# Patient Record
Sex: Female | Born: 1970
Health system: Southern US, Community
[De-identification: ages and names within clinical notes are randomized; demographics above are authoritative.]

## PROBLEM LIST (undated history)

## (undated) DIAGNOSIS — R51 Headache: Secondary | ICD-10-CM

## (undated) DIAGNOSIS — R011 Cardiac murmur, unspecified: Secondary | ICD-10-CM

## (undated) DIAGNOSIS — F17200 Nicotine dependence, unspecified, uncomplicated: Secondary | ICD-10-CM

## (undated) DIAGNOSIS — C3490 Malignant neoplasm of unspecified part of unspecified bronchus or lung: Secondary | ICD-10-CM

## (undated) DIAGNOSIS — R7303 Prediabetes: Secondary | ICD-10-CM

## (undated) DIAGNOSIS — R000 Tachycardia, unspecified: Secondary | ICD-10-CM

## (undated) DIAGNOSIS — J449 Chronic obstructive pulmonary disease, unspecified: Secondary | ICD-10-CM

## (undated) DIAGNOSIS — R519 Headache, unspecified: Secondary | ICD-10-CM

## (undated) DIAGNOSIS — D759 Disease of blood and blood-forming organs, unspecified: Secondary | ICD-10-CM

## (undated) DIAGNOSIS — D682 Hereditary deficiency of other clotting factors: Secondary | ICD-10-CM

## (undated) HISTORY — DX: Nicotine dependence, unspecified, uncomplicated: F17.200

## (undated) HISTORY — PX: VAGINAL HYSTERECTOMY: SUR661

---

## 2000-03-05 ENCOUNTER — Emergency Department (HOSPITAL_COMMUNITY): Admission: EM | Admit: 2000-03-05 | Discharge: 2000-03-05 | Payer: Self-pay | Admitting: Emergency Medicine

## 2000-03-05 ENCOUNTER — Encounter: Payer: Self-pay | Admitting: Emergency Medicine

## 2000-08-12 ENCOUNTER — Emergency Department (HOSPITAL_COMMUNITY): Admission: EM | Admit: 2000-08-12 | Discharge: 2000-08-12 | Payer: Self-pay | Admitting: Emergency Medicine

## 2000-10-05 ENCOUNTER — Emergency Department (HOSPITAL_COMMUNITY): Admission: EM | Admit: 2000-10-05 | Discharge: 2000-10-05 | Payer: Self-pay | Admitting: Emergency Medicine

## 2000-10-05 ENCOUNTER — Encounter: Payer: Self-pay | Admitting: Emergency Medicine

## 2000-11-13 ENCOUNTER — Other Ambulatory Visit: Admission: RE | Admit: 2000-11-13 | Discharge: 2000-11-13 | Payer: Self-pay | Admitting: Obstetrics and Gynecology

## 2000-12-19 ENCOUNTER — Inpatient Hospital Stay (HOSPITAL_COMMUNITY): Admission: AD | Admit: 2000-12-19 | Discharge: 2000-12-19 | Payer: Self-pay | Admitting: Obstetrics and Gynecology

## 2001-05-20 ENCOUNTER — Encounter: Payer: Self-pay | Admitting: Obstetrics and Gynecology

## 2001-05-20 ENCOUNTER — Ambulatory Visit (HOSPITAL_COMMUNITY): Admission: RE | Admit: 2001-05-20 | Discharge: 2001-05-20 | Payer: Self-pay | Admitting: Obstetrics and Gynecology

## 2001-06-14 ENCOUNTER — Encounter (INDEPENDENT_AMBULATORY_CARE_PROVIDER_SITE_OTHER): Payer: Self-pay | Admitting: Specialist

## 2001-06-14 ENCOUNTER — Inpatient Hospital Stay (HOSPITAL_COMMUNITY): Admission: AD | Admit: 2001-06-14 | Discharge: 2001-06-18 | Payer: Self-pay | Admitting: Obstetrics and Gynecology

## 2001-06-19 ENCOUNTER — Encounter: Admission: RE | Admit: 2001-06-19 | Discharge: 2001-07-19 | Payer: Self-pay | Admitting: Obstetrics and Gynecology

## 2001-12-27 ENCOUNTER — Ambulatory Visit (HOSPITAL_COMMUNITY): Admission: RE | Admit: 2001-12-27 | Discharge: 2001-12-27 | Payer: Self-pay | Admitting: Oncology

## 2002-07-16 ENCOUNTER — Ambulatory Visit (HOSPITAL_COMMUNITY): Admission: RE | Admit: 2002-07-16 | Discharge: 2002-07-16 | Payer: Self-pay | Admitting: Oncology

## 2002-07-16 ENCOUNTER — Encounter: Payer: Self-pay | Admitting: Oncology

## 2003-03-27 ENCOUNTER — Ambulatory Visit (HOSPITAL_COMMUNITY): Admission: RE | Admit: 2003-03-27 | Discharge: 2003-03-27 | Payer: Self-pay | Admitting: Oncology

## 2003-03-27 ENCOUNTER — Encounter: Payer: Self-pay | Admitting: Oncology

## 2004-01-28 ENCOUNTER — Ambulatory Visit: Admission: RE | Admit: 2004-01-28 | Discharge: 2004-01-28 | Payer: Self-pay | Admitting: Oncology

## 2005-03-29 ENCOUNTER — Ambulatory Visit: Payer: Self-pay | Admitting: Oncology

## 2007-04-18 ENCOUNTER — Emergency Department (HOSPITAL_COMMUNITY): Admission: EM | Admit: 2007-04-18 | Discharge: 2007-04-18 | Payer: Self-pay | Admitting: Emergency Medicine

## 2007-04-23 ENCOUNTER — Emergency Department (HOSPITAL_COMMUNITY): Admission: EM | Admit: 2007-04-23 | Discharge: 2007-04-23 | Payer: Self-pay | Admitting: *Deleted

## 2007-05-08 ENCOUNTER — Ambulatory Visit: Payer: Self-pay | Admitting: Family Medicine

## 2007-05-08 DIAGNOSIS — K921 Melena: Secondary | ICD-10-CM

## 2007-05-08 DIAGNOSIS — R071 Chest pain on breathing: Secondary | ICD-10-CM

## 2007-05-08 DIAGNOSIS — N760 Acute vaginitis: Secondary | ICD-10-CM | POA: Insufficient documentation

## 2007-05-08 DIAGNOSIS — Z862 Personal history of diseases of the blood and blood-forming organs and certain disorders involving the immune mechanism: Secondary | ICD-10-CM | POA: Insufficient documentation

## 2007-05-08 DIAGNOSIS — K2960 Other gastritis without bleeding: Secondary | ICD-10-CM

## 2007-05-08 DIAGNOSIS — I809 Phlebitis and thrombophlebitis of unspecified site: Secondary | ICD-10-CM

## 2007-05-24 ENCOUNTER — Ambulatory Visit: Payer: Self-pay | Admitting: Family Medicine

## 2007-05-29 ENCOUNTER — Encounter: Admission: RE | Admit: 2007-05-29 | Discharge: 2007-05-29 | Payer: Self-pay | Admitting: Family Medicine

## 2008-01-16 ENCOUNTER — Emergency Department (HOSPITAL_COMMUNITY): Admission: EM | Admit: 2008-01-16 | Discharge: 2008-01-16 | Payer: Self-pay | Admitting: Family Medicine

## 2008-02-07 ENCOUNTER — Ambulatory Visit (HOSPITAL_COMMUNITY): Admission: RE | Admit: 2008-02-07 | Discharge: 2008-02-07 | Payer: Self-pay | Admitting: Gastroenterology

## 2008-02-07 ENCOUNTER — Encounter (INDEPENDENT_AMBULATORY_CARE_PROVIDER_SITE_OTHER): Payer: Self-pay | Admitting: Gastroenterology

## 2009-07-21 ENCOUNTER — Ambulatory Visit (HOSPITAL_COMMUNITY): Admission: RE | Admit: 2009-07-21 | Discharge: 2009-07-21 | Payer: Self-pay | Admitting: Obstetrics & Gynecology

## 2009-11-30 ENCOUNTER — Emergency Department (HOSPITAL_COMMUNITY): Admission: EM | Admit: 2009-11-30 | Discharge: 2009-11-30 | Payer: Self-pay | Admitting: Emergency Medicine

## 2010-12-20 ENCOUNTER — Ambulatory Visit: Payer: Self-pay | Admitting: Hematology & Oncology

## 2010-12-23 LAB — CBC
HCT: 38.5 % (ref 36.0–46.0)
Hemoglobin: 13.1 g/dL (ref 12.0–15.0)
MCV: 92.9 fL (ref 78.0–100.0)
RBC: 4.15 MIL/uL (ref 3.87–5.11)
WBC: 6 10*3/uL (ref 4.0–10.5)

## 2010-12-23 LAB — URINE MICROSCOPIC-ADD ON

## 2010-12-23 LAB — CSF CULTURE W GRAM STAIN: Culture: NO GROWTH

## 2010-12-23 LAB — PROTIME-INR: INR: 1.02 (ref 0.00–1.49)

## 2010-12-23 LAB — URINALYSIS, ROUTINE W REFLEX MICROSCOPIC
Glucose, UA: NEGATIVE mg/dL
Ketones, ur: 15 mg/dL — AB
Leukocytes, UA: NEGATIVE
Nitrite: NEGATIVE
Protein, ur: NEGATIVE mg/dL
Specific Gravity, Urine: 1.023 (ref 1.005–1.030)
Urobilinogen, UA: 0.2 mg/dL (ref 0.0–1.0)
pH: 5.5 (ref 5.0–8.0)

## 2010-12-23 LAB — CSF CELL COUNT WITH DIFFERENTIAL
RBC Count, CSF: 1 /mm3 — ABNORMAL HIGH
RBC Count, CSF: 2 /mm3 — ABNORMAL HIGH
WBC, CSF: 1 /mm3 (ref 0–5)
WBC, CSF: 3 /mm3 (ref 0–5)

## 2010-12-23 LAB — POCT I-STAT, CHEM 8
BUN: 12 mg/dL (ref 6–23)
Calcium, Ion: 1.19 mmol/L (ref 1.12–1.32)
HCT: 45 % (ref 36.0–46.0)
Hemoglobin: 15.3 g/dL — ABNORMAL HIGH (ref 12.0–15.0)
Sodium: 140 mEq/L (ref 135–145)

## 2010-12-23 LAB — PROTEIN AND GLUCOSE, CSF
Glucose, CSF: 70 mg/dL (ref 43–76)
Total  Protein, CSF: 31 mg/dL (ref 15–45)

## 2010-12-23 LAB — APTT: aPTT: 28 seconds (ref 24–37)

## 2010-12-30 ENCOUNTER — Other Ambulatory Visit: Payer: Self-pay | Admitting: Hematology & Oncology

## 2010-12-30 ENCOUNTER — Ambulatory Visit (HOSPITAL_BASED_OUTPATIENT_CLINIC_OR_DEPARTMENT_OTHER)
Admission: RE | Admit: 2010-12-30 | Discharge: 2010-12-30 | Disposition: A | Discharge status: Home / Self Care | Payer: 59 | Source: Ambulatory Visit | Attending: Hematology & Oncology | Admitting: Hematology & Oncology

## 2010-12-30 ENCOUNTER — Ambulatory Visit (HOSPITAL_BASED_OUTPATIENT_CLINIC_OR_DEPARTMENT_OTHER): Payer: Commercial Managed Care - PPO | Admitting: Hematology & Oncology

## 2010-12-30 DIAGNOSIS — F17200 Nicotine dependence, unspecified, uncomplicated: Secondary | ICD-10-CM

## 2010-12-30 DIAGNOSIS — R634 Abnormal weight loss: Secondary | ICD-10-CM | POA: Insufficient documentation

## 2010-12-30 DIAGNOSIS — R059 Cough, unspecified: Secondary | ICD-10-CM | POA: Insufficient documentation

## 2010-12-30 DIAGNOSIS — R05 Cough: Secondary | ICD-10-CM | POA: Insufficient documentation

## 2010-12-30 DIAGNOSIS — K625 Hemorrhage of anus and rectum: Secondary | ICD-10-CM

## 2010-12-30 DIAGNOSIS — D6949 Other primary thrombocytopenia: Secondary | ICD-10-CM

## 2010-12-30 DIAGNOSIS — D6859 Other primary thrombophilia: Secondary | ICD-10-CM

## 2010-12-30 DIAGNOSIS — D696 Thrombocytopenia, unspecified: Secondary | ICD-10-CM

## 2010-12-30 LAB — CBC WITH DIFFERENTIAL (CANCER CENTER ONLY)
BASO#: 0 10*3/uL (ref 0.0–0.2)
EOS%: 1.4 % (ref 0.0–7.0)
Eosinophils Absolute: 0.1 10*3/uL (ref 0.0–0.5)
MONO%: 7.6 % (ref 0.0–13.0)
Platelets: 121 10*3/uL — ABNORMAL LOW (ref 145–400)
RBC: 4.06 10*6/uL (ref 3.70–5.32)
WBC: 5.7 10*3/uL (ref 3.9–10.0)

## 2011-01-02 LAB — T4: T4, Total: 9.7 ug/dL (ref 5.0–12.5)

## 2011-01-02 LAB — CARDIOLIPIN ANTIBODIES, IGG, IGM, IGA
Anticardiolipin IgA: 3 APL U/mL (ref <22)
Anticardiolipin IgM: 2 MPL U/mL (ref <11)

## 2011-01-03 LAB — LUPUS ANTICOAGULANT PANEL
DRVVT: 35.4 secs — ABNORMAL LOW (ref 36.2–44.3)
Lupus Anticoagulant: NOT DETECTED

## 2011-01-04 ENCOUNTER — Other Ambulatory Visit (HOSPITAL_COMMUNITY): Payer: Commercial Managed Care - PPO

## 2011-01-06 ENCOUNTER — Ambulatory Visit (HOSPITAL_COMMUNITY)
Admission: RE | Admit: 2011-01-06 | Discharge: 2011-01-06 | Disposition: A | Discharge status: Home / Self Care | Payer: 59 | Source: Ambulatory Visit | Attending: Hematology & Oncology | Admitting: Hematology & Oncology

## 2011-01-06 DIAGNOSIS — R634 Abnormal weight loss: Secondary | ICD-10-CM | POA: Insufficient documentation

## 2011-01-06 DIAGNOSIS — K625 Hemorrhage of anus and rectum: Secondary | ICD-10-CM

## 2011-02-01 ENCOUNTER — Other Ambulatory Visit: Payer: Self-pay | Admitting: Hematology & Oncology

## 2011-02-01 ENCOUNTER — Encounter (HOSPITAL_BASED_OUTPATIENT_CLINIC_OR_DEPARTMENT_OTHER): Payer: Commercial Managed Care - PPO | Admitting: Hematology & Oncology

## 2011-02-01 DIAGNOSIS — D6949 Other primary thrombocytopenia: Secondary | ICD-10-CM

## 2011-02-01 DIAGNOSIS — D696 Thrombocytopenia, unspecified: Secondary | ICD-10-CM

## 2011-02-01 LAB — CBC WITH DIFFERENTIAL (CANCER CENTER ONLY)
BASO#: 0 10*3/uL (ref 0.0–0.2)
Eosinophils Absolute: 0.1 10*3/uL (ref 0.0–0.5)
HCT: 37.2 % (ref 34.8–46.6)
LYMPH#: 1.7 10*3/uL (ref 0.9–3.3)
MCHC: 34.1 g/dL (ref 32.0–36.0)
MONO#: 0.4 10*3/uL (ref 0.1–0.9)
RDW: 13.2 % (ref 11.1–15.7)

## 2011-02-01 LAB — CHCC SATELLITE - SMEAR

## 2011-02-14 NOTE — Op Note (Signed)
Heather [name], Heather [name]                 ACCOUNT NO.:  [id number]   MEDICAL RECORD NO.:  [id number]           PATIENT TYPE:   LOCATION:                                 FACILITY:   PHYSICIAN:  John C. Madilyn Fireman, M.D.    DATE OF BIRTH:  06-16-1971   DATE OF PROCEDURE:  DATE OF DISCHARGE:                               OPERATIVE REPORT   INDICATIONS FOR PROCEDURE:  Rectal bleeding, heme-positive stools, and  family history of colon cancer in a second-degree relative.   PROCEDURE:  The patient was placed in the left lateral decubitus  position and placed on the pulse monitor with continuous low-flow oxygen  delivered by nasal cannula.  She was sedated with 125 mcg of IV fentanyl  and 12.5 mg of IV Versed.  The Olympus video colonoscope was inserted  into the rectum and advanced to the cecum, confirmed by  transillumination McBurney's point and visualization of the ileocecal  valve and appendiceal orifice.  The prep was good.  The cecum and  ascending colon appeared normal with no masses, polyps, diverticula, or  other mucosal abnormalities.  Within the transverse colon, there was a 1-  cm sessile polyp that was removed by snare.  In the sigmoid colon, there  was a 4-mm polyp that was removed by hot biopsy.  Otherwise, no  abnormalities were seen.  The rectum, likewise, appeared normal and  retroflexed view of the anus revealed only small internal hemorrhoids.  The scope was then withdrawn and the patient returned to the recovery  room in stable condition.  She tolerated the procedure well.  There were  no immediate complications.   IMPRESSION:  1. A 1-cm sessile polyp in the transverse colon.  2. A 4-mm sigmoid polyp.   PLAN:  Await histology to determine method and interval for future colon  screening.           ______________________________  Everardo All. Madilyn Fireman, M.D.     J[facility]/MEDQ  D:  02/07/2008  T:  02/08/2008  Job:  [id number]   cc:   Onalee Hua L. Call, MD

## 2011-02-17 NOTE — Discharge Summary (Signed)
[facility]  Patient:    Heather [name], Heather [name] Visit Number: [id number] MRN: [id number]          Service Type: OBS Location: 910A 9136 01 Attending Physician:  Melony Overly Dictated by:   Janeece Riggers Dareen Piano, M.D. Admit Date:  06/14/2001 Discharge Date: 06/18/2001                             Discharge Summary  PRINCIPAL DISCHARGE DIAGNOSES:                    1. Intrauterine pregnancy at 38-1/2 weeks                                  estimated gestational age.                               2. History of prior cesarean section.                               3. Declined attempt at vaginal birth after                                  cesarean section.                               4. Factor V Leiden deficiency, heterozygous                                  status.                               5. Thrombocytopenia.                               6. Desires permanent sterilization.  PRINCIPAL PROCEDURES:         1. Repeat low-transverse cesarean section.                               2. Bilateral tubal ligation.  HISTORY OF PRESENT ILLNESS:   The patient is a 40 year old female, G3, P1-0-1-1, at 38-1/2 weeks estimated gestational age who presented to the [facility] for a repeat cesarean section and bilateral tubal ligation. In light of her history of a factor V Leiden deficiency, the patient was treated with Lovenox in the third trimester.  She has no history of DVT or blood clots.  The patient was off Lovenox since 37 weeks estimated gestational age.  HOSPITAL COURSE:              On admission, the patients coagulation studies were within normal limits.  Her platelet count was 102.  The patient underwent a repeat cesarean section by Dr. Conley Simmonds, a complete description of this could be found dictated in operative note.  She delivered one live viable white female with Apgars at 9 at one minute and 1 at five minutes, weight of  7 pounds 14 ounces.  Placenta was sent to pathology.  During the postoperative period, the patient was complaining of some right shoulder pain.  This resolved spontaneously.  The baby was transferred to the neonatal intensive care unit in light of breathing difficulties.  The patient did well throughout the remaining hospitalization.  The patient was discharged home on postoperative day #4.  The patients preoperative hemoglobin was 11.4, postoperative 10.7.  The platelet count postoperative was 98,000.  The patient was discharged to home.  She was instructed to follow up in the office in four weeks.  She was sent home with Tylox to take p.r.n. Dictated by:   Janeece Riggers Dareen Piano, M.D. Attending Physician:  Melony Overly DD:  07/12/01 TD:  07/12/01 Job: [id number]

## 2011-02-17 NOTE — Op Note (Signed)
[facility]  Patient:    Heather [name], Heather [name] Visit Number: [id number] MRN: [id number]          Service Type: OBS Location: 910B 9198 01 Attending Physician:  Melony Overly Dictated by:   Devoria Albe Edward Jolly, M.D. Proc. Date: 06/14/01 Admit Date:  06/14/2001                             Operative Report  PREOPERATIVE DIAGNOSES:       1. Intrauterine pregnancy 38+4 weeks.                               2. History of cesarean section, declines                                  vaginal birth after cesarean section.                               3. Desire for permanent sterilization.                               4. Factor V Leiden deficiency:  Heterozygous                                  status.                               5. Thrombocytopenia.  POSTOPERATIVE DIAGNOSES:      1. Intrauterine pregnancy at 38+4 weeks.                               2. History of cesarean section, declines                                  vaginal birth after cesarean section.                               3. Desire for permanent sterilization.                               4. Factor V Leiden deficiency:  Heterozygous                                  status.                               5. Thrombocytopenia.  OPERATION:                    Primary low segment transverse cesarean section,                               bilateral tubal ligation.  SURGEON:  Brook A. Edward Jolly, M.D.  ASSISTANT:                    Gerlene Burdock D. Arlyce Dice, M.D.  ANESTHESIA:                   Spinal anesthesia.  COMPLICATIONS:                None.  INDICATIONS:                  The patient was a 40 year old gravida 3, para 1-0-1-1 Caucasian female at 38+[redacted] weeks gestation [facility] June 24, 2001), with a history of prior C-section for a breech presentation and a known heterozygous status for factor V Leiden deficiency diagnosed during this pregnancy and treated with Lovenox in the third  trimester, who presented for repeat cesarean section after declining a vaginal trial of labor.  The patient had expressed a desire for permanent sterilization throughout her antepartum course, and she chose to proceed with this at the time of her cesarean delivery.  With respect to the patients factor V Leiden deficiency status, she had no history of deep venous thrombosis or blood clots, and she was treated with the Lovenox in the third trimester until 37 weeks of gestation. The patient chose to proceed with a repeat C-section and the tubal ligation after the risks, benefits, and alternatives were discussed with her.  The patient was quoted a failure rate of the tubal ligation of 1 in 250 to 1 in 300 which may result in either an intrauterine or ectopic pregnancy.  The patient accepted this.  FINDINGS:                     A viable female was delivered at 12:45 p.m. with Apgars of 9 at one minute and 9 at five minutes. The weight was 7 pounds 14 ounces.  The newborn was noted to be vigorous at birth and without any obvious abnormalities.  SPECIMENS:                    The placenta was sent to pathology.  DESCRIPTION OF PROCEDURE:     With an IV in place, the patient was taken to the operating room after she was properly identified.  The patient received a spinal anesthetic and she was then placed in the supine position.  The abdomen and vulva were sterilely prepped. A Foley catheter was sterilely placed inside the bladder. She was then sterilely draped.  After adequate anesthesia was insured, Pfannenstiel incision was created along the site of the patients prior incision.  This was carried down to the fascia using the scalpel. The fascia was then scored in the midline with the scalpel. Monopolar cautery was used to come through some of the scar tissue and subcutaneous tissue down to the level of the fascia. The fascial incision was then incised bilaterally in a transverse fashion with  the Mayo scissors. The rectus muscles were dissected off of the fascia using Mayo scissors.  The parietal peritoneum was entered sharply with Metzenbaum scissors after elevating it with two hemostat clamps. The incision was then extended cranially and caudally.  Dense adhesions overlying the bladder were lysed using a scalpel.  This was performed without difficulty.  There was no evidence of cystotomy.  A bladder retractor was placed over the bladder, and the lower uterine segment was exposed.  A bladder flap was created sharply with a Metzenbaum scissors. The lower  uterine segment was then incised in a transverse fashion using the scalpel.  The incision was enlarged using a bandage scissors. An Allis clamp was used to rupture membranes.  The fluid was clear.  A hand was inserted through the incision, and the vertex was then delivered without difficulty along with the remainder of the infant. The nares and mouth were suctioned, and the cord was doubly clamped and cut.  The newborn was vigorous and carried over to the awaiting pediatricians.  The patient did receive cefazolin 1 g intravenously at cord clamp. Cord blood was obtained.  The placenta was manually extracted along with any remaining membranes from within the uterine cavity.  The uterus was exteriorized for its closure. This was a two-layer closer of #1 chromic. The first layer was a running locked layer, and the second layer was an imbricating layer.  Hemostasis was then excellent.  Bilateral tubal ligation was performed at this time.  The tubes and ovaries were noted to be normal. The right fallopian tube was grasped in its mid portion and followed to its fimbriated end.  A free-tie of 0 plain suture was then placed at the base of the knuckle of tissue.  A snap clamp was used to come through the mesosalpinx, and an additional free-tie of 2-0 plain was placed at the base of each knuckle of tissue. The intervening portion  was excised with the Metzenbaum scissors and it was sent to pathology.  Hemostasis was excellent.  The same procedure that was performed on the patients right  side was then repeated on the left.  After that, fallopian tube was grasped and followed all the way to its fimbriated end.  Again, the specimen was sent to pathology and hemostasis was excellent.  The uterus was returned to the peritoneal cavity and it was irrigated and suctioned.  Hemostasis was excellent.  The fascia was next examined and there was no evidence of any hematomas and no bleeding.  The fascia was therefore closed with a running suture of 0 Vicryl. The subcutaneous tissue was then irrigated, and small bleeding vessels were cauterized with monopolar cautery. Hemostasis was excellent.  Then the subcutaneous tissue was closed with interrupted sutures of 3-0 plain followed by staples on the skin and a sterile pressure dressing.  The uterus was expressed of any remaining clots. The patient was escorted to the recovery room in stable and good condition. There were no complications to the procedure.  All sponge, needle and instrument counts were correct. Dictated by:   Devoria Albe Edward Jolly, M.D. Attending Physician:  Melony Overly DD:  06/14/01 TD:  06/14/01 Job: [id number]

## 2011-05-03 ENCOUNTER — Other Ambulatory Visit (HOSPITAL_COMMUNITY): Payer: Self-pay | Admitting: Obstetrics & Gynecology

## 2011-05-03 DIAGNOSIS — R1900 Intra-abdominal and pelvic swelling, mass and lump, unspecified site: Secondary | ICD-10-CM

## 2011-05-03 DIAGNOSIS — Z90710 Acquired absence of both cervix and uterus: Secondary | ICD-10-CM

## 2011-05-03 DIAGNOSIS — R102 Pelvic and perineal pain: Secondary | ICD-10-CM

## 2011-05-05 ENCOUNTER — Ambulatory Visit (HOSPITAL_COMMUNITY)
Admission: RE | Admit: 2011-05-05 | Discharge: 2011-05-05 | Disposition: A | Discharge status: Home / Self Care | Payer: 59 | Source: Ambulatory Visit | Attending: Obstetrics & Gynecology | Admitting: Obstetrics & Gynecology

## 2011-05-05 DIAGNOSIS — R1900 Intra-abdominal and pelvic swelling, mass and lump, unspecified site: Secondary | ICD-10-CM

## 2011-05-05 DIAGNOSIS — R102 Pelvic and perineal pain: Secondary | ICD-10-CM

## 2011-05-05 DIAGNOSIS — N938 Other specified abnormal uterine and vaginal bleeding: Secondary | ICD-10-CM | POA: Insufficient documentation

## 2011-05-05 DIAGNOSIS — Z90710 Acquired absence of both cervix and uterus: Secondary | ICD-10-CM | POA: Insufficient documentation

## 2011-05-05 DIAGNOSIS — N949 Unspecified condition associated with female genital organs and menstrual cycle: Secondary | ICD-10-CM | POA: Insufficient documentation

## 2011-05-05 MED ORDER — IOHEXOL 300 MG/ML  SOLN
100.0000 mL | Freq: Once | INTRAMUSCULAR | Status: AC | PRN
  Administered 2011-05-05: 100 mL via INTRAVENOUS

## 2011-06-19 ENCOUNTER — Other Ambulatory Visit: Payer: Self-pay | Admitting: Obstetrics & Gynecology

## 2011-06-27 LAB — POCT URINALYSIS DIP (DEVICE)
Bilirubin Urine: NEGATIVE
Ketones, ur: NEGATIVE
Protein, ur: NEGATIVE
pH: 6

## 2011-06-27 LAB — WET PREP, GENITAL: Yeast Wet Prep HPF POC: NONE SEEN

## 2011-06-27 LAB — APTT: aPTT: 25

## 2011-06-27 LAB — POCT I-STAT, CHEM 8
BUN: 11
Calcium, Ion: 1.27
Chloride: 104
Creatinine, Ser: 0.9
Glucose, Bld: 102 — ABNORMAL HIGH
TCO2: 30

## 2011-06-27 LAB — PROTIME-INR: INR: 0.9

## 2011-06-30 ENCOUNTER — Encounter (HOSPITAL_COMMUNITY)
Admission: RE | Admit: 2011-06-30 | Discharge: 2011-06-30 | Disposition: A | Discharge status: Home / Self Care | Payer: 59 | Source: Ambulatory Visit | Attending: Obstetrics & Gynecology | Admitting: Obstetrics & Gynecology

## 2011-06-30 ENCOUNTER — Encounter (HOSPITAL_COMMUNITY): Payer: Self-pay

## 2011-06-30 HISTORY — DX: Hereditary deficiency of other clotting factors: D68.2

## 2011-06-30 HISTORY — DX: Disease of blood and blood-forming organs, unspecified: D75.9

## 2011-06-30 LAB — BASIC METABOLIC PANEL
BUN: 14 mg/dL (ref 6–23)
CO2: 29 mEq/L (ref 19–32)
Calcium: 10.1 mg/dL (ref 8.4–10.5)
Creatinine, Ser: 0.67 mg/dL (ref 0.50–1.10)
GFR calc non Af Amer: >60 mL/min (ref >60)
Glucose, Bld: 83 mg/dL (ref 70–99)
Sodium: 138 mEq/L (ref 135–145)

## 2011-06-30 LAB — CBC
MCH: 31.1 pg (ref 26.0–34.0)
MCHC: 33.2 g/dL (ref 30.0–36.0)
MCV: 93.8 fL (ref 78.0–100.0)
Platelets: 136 10*3/uL — ABNORMAL LOW (ref 150–400)
RDW: 13.7 % (ref 11.5–15.5)

## 2011-06-30 LAB — SURGICAL PCR SCREEN: MRSA, PCR: NEGATIVE

## 2011-06-30 LAB — APTT: aPTT: 27 seconds (ref 24–37)

## 2011-06-30 NOTE — Patient Instructions (Addendum)
   Your procedure is scheduled on: Friday, Oct. 5, 2012  Enter through the [employer] of [facility] at: 6:00am Pick up the phone at the desk and dial [phone] and inform us of your arrival  Please call this number if you have any problems the morning of surgery: [phone]  Remember: Do not eat food after midnight Thursday 10/4 Do not drink clear liquids after: midnight Thursday 10/4 Take these medicines the morning of surgery with a SIP OF WATER: none  Do not wear jewelry, make-up, or FINGER nail polish Do not wear lotions, powders, or perfumes.  You may wear deodorant. Do not shave 48 hours prior to surgery. Do not bring valuables to the hospital. Leave suitcase in the car. After Surgery it may be brought to your room. For patients being admitted to the hospital, checkout time is 11:00am the day of discharge.  Patients discharged on the day of surgery will not be allowed to drive home.   Name and phone number of your driver: son Heather [name]    Remember to use your hibiclens as instructed.Please shower with 1/2 bottle the evening before your surgery and the other 1/2 bottle the morning of surgery.

## 2011-06-30 NOTE — Pre-Procedure Instructions (Signed)
Ok to see anesthesia DOS 

## 2011-07-06 ENCOUNTER — Other Ambulatory Visit (HOSPITAL_COMMUNITY): Payer: Self-pay | Admitting: Obstetrics & Gynecology

## 2011-07-06 DIAGNOSIS — R102 Pelvic and perineal pain: Secondary | ICD-10-CM

## 2011-07-06 NOTE — H&P (Signed)
Heather [name] is an 40 y.o. female. [id number]. Here for diagnostic laparoscopy, possible robot assisted lysis of adhesions and vaginal exam under anesthesia for ongoing pelvic pain, dyspareunia, severe foul vag dc since I first saw her in 2010 and her complaints date back since her TAH/BSO in 2008 for cervical cancer and prolapse (done with [facility])  Expectant mgmt/ treating vag infections/ improving vaginal health with vaginal estrogen have failed. Pelvic sono and pelvic/abdominal CT with contrast failed to reveal any abnormal findings inc masses/ adhesions/ pelvic fluid/ fistula etc.  I discussed referral to Pain clinic at [facility] vs diag.L'scpy and possible management if abnormal findings (as long as not severe bowel adhesions) and if negative referring to pain clinic then. Patient desires intervention with l'scopy first and is willing to see specialist if needed after.  Prior op note from 2008 reviewed with just TAH/BSO, no intervention mentioned in Op note of prolapse suspension/ repair ir use of foreign non-absorbable sutures.   No LMP recorded. Patient has had a hysterectomy.   Past Medical History  Diagnosis Date  . Difficult intubation     pt was told difficult intubation at [facility]  . Blood dyscrasia   . Factor V deficiency     protein deficiency   Past Surgical History  Procedure Date  . Abdominal hysterectomy   . Cesarean section     x 2   No family history on file.  Social History:- Smoker-  reports that she has been smoking Cigarettes.  She has a 28 pack-year smoking history. She has never used smokeless tobacco. She reports that she drinks alcohol. She reports that she does not use illicit drugs.  Allergies:  Allergies  Allergen Reactions  . Aspirin Nausea And Vomiting  . Contrast Media (Iodinated Diagnostic Agents) Swelling    (Not in a hospital admission)  ROS - Wt loss ++, poor appetite, no bruising/hematomas (has chronic low platelets, s/p Heme  consult in 02/12), no CP/SOB/ bladder or bowel movement problems.    There were no vitals taken for this visit.  Physical Exam  A&O x 3, no acute distress. Pleasant, very thin built. HEENT neg, no thyromegaly Lungs CTA bilat CV RRR, S1S2 normal Abdo soft, non tender, non acute Extr no edema/ tenderness Pelvic - office exam reveal - excessive discharge, very atrophic vagina, no prolapse, thick scar tissue felt in right adnexa.    No results found for this or any previous visit (from the past 24 hour(s)).  No results found.  Assessment/Plan: Pelvic pain, dyspareunia, severe foul vag discharge.  Diagnostic laparoscopy, possible operative l/scopy with possible robot intervention.  Risks/ complications/ alternative reviewed, pa understands and agrees.   Tasharra Nodine R 07/06/2011, 11:16 PM

## 2011-07-07 ENCOUNTER — Other Ambulatory Visit: Payer: Self-pay | Admitting: Obstetrics & Gynecology

## 2011-07-07 ENCOUNTER — Ambulatory Visit (HOSPITAL_COMMUNITY)
Admission: RE | Admit: 2011-07-07 | Discharge: 2011-07-07 | Disposition: A | Discharge status: Home / Self Care | Payer: 59 | Source: Ambulatory Visit | Attending: Obstetrics & Gynecology | Admitting: Obstetrics & Gynecology

## 2011-07-07 ENCOUNTER — Encounter (HOSPITAL_COMMUNITY): Payer: Self-pay | Admitting: Obstetrics & Gynecology

## 2011-07-07 ENCOUNTER — Encounter (HOSPITAL_COMMUNITY)
Admission: RE | Disposition: A | Discharge status: Home / Self Care | Payer: Self-pay | Source: Ambulatory Visit | Attending: Obstetrics & Gynecology

## 2011-07-07 ENCOUNTER — Ambulatory Visit (HOSPITAL_COMMUNITY): Payer: 59 | Admitting: Anesthesiology

## 2011-07-07 ENCOUNTER — Encounter (HOSPITAL_COMMUNITY): Payer: Self-pay | Admitting: Anesthesiology

## 2011-07-07 DIAGNOSIS — N949 Unspecified condition associated with female genital organs and menstrual cycle: Secondary | ICD-10-CM | POA: Insufficient documentation

## 2011-07-07 DIAGNOSIS — N736 Female pelvic peritoneal adhesions (postinfective): Secondary | ICD-10-CM | POA: Insufficient documentation

## 2011-07-07 DIAGNOSIS — Z01818 Encounter for other preprocedural examination: Secondary | ICD-10-CM | POA: Insufficient documentation

## 2011-07-07 DIAGNOSIS — R102 Pelvic and perineal pain: Secondary | ICD-10-CM

## 2011-07-07 DIAGNOSIS — Z90710 Acquired absence of both cervix and uterus: Secondary | ICD-10-CM | POA: Insufficient documentation

## 2011-07-07 DIAGNOSIS — IMO0002 Reserved for concepts with insufficient information to code with codable children: Secondary | ICD-10-CM | POA: Insufficient documentation

## 2011-07-07 DIAGNOSIS — D6859 Other primary thrombophilia: Secondary | ICD-10-CM | POA: Insufficient documentation

## 2011-07-07 DIAGNOSIS — D696 Thrombocytopenia, unspecified: Secondary | ICD-10-CM | POA: Insufficient documentation

## 2011-07-07 DIAGNOSIS — Z01812 Encounter for preprocedural laboratory examination: Secondary | ICD-10-CM | POA: Insufficient documentation

## 2011-07-07 DIAGNOSIS — N898 Other specified noninflammatory disorders of vagina: Secondary | ICD-10-CM | POA: Insufficient documentation

## 2011-07-07 HISTORY — PX: LESION REMOVAL: SHX5196

## 2011-07-07 HISTORY — PX: LAPAROSCOPY: SHX197

## 2011-07-07 SURGERY — ROBOTIC ASSISTED LAPAROSCOPIC LYSIS OF ADHESION
Anesthesia: General | Site: Vagina | Wound class: Clean

## 2011-07-07 MED ORDER — MIDAZOLAM HCL 5 MG/5ML IJ SOLN
INTRAMUSCULAR | Status: DC | PRN
  Administered 2011-07-07: 2 mg via INTRAVENOUS

## 2011-07-07 MED ORDER — ONDANSETRON HCL 4 MG/2ML IJ SOLN
INTRAMUSCULAR | Status: AC
  Filled 2011-07-07: qty 2

## 2011-07-07 MED ORDER — MIDAZOLAM HCL 2 MG/2ML IJ SOLN
INTRAMUSCULAR | Status: AC
  Filled 2011-07-07: qty 2

## 2011-07-07 MED ORDER — PROPOFOL 10 MG/ML IV EMUL
INTRAVENOUS | Status: AC
  Filled 2011-07-07: qty 20

## 2011-07-07 MED ORDER — GLYCOPYRROLATE 0.2 MG/ML IJ SOLN
INTRAMUSCULAR | Status: DC | PRN
  Administered 2011-07-07: .6 mg via INTRAVENOUS
  Administered 2011-07-07: 0.1 mg via INTRAVENOUS

## 2011-07-07 MED ORDER — FENTANYL CITRATE 0.05 MG/ML IJ SOLN
INTRAMUSCULAR | Status: AC
  Filled 2011-07-07: qty 5

## 2011-07-07 MED ORDER — LIDOCAINE HCL (CARDIAC) 20 MG/ML IV SOLN
INTRAVENOUS | Status: DC | PRN
  Administered 2011-07-07: 80 mg via INTRAVENOUS

## 2011-07-07 MED ORDER — HEPARIN SODIUM (PORCINE) 5000 UNIT/ML IJ SOLN
INTRAMUSCULAR | Status: AC
  Administered 2011-07-07: 5000 [IU] via SUBCUTANEOUS
  Filled 2011-07-07: qty 1

## 2011-07-07 MED ORDER — METOCLOPRAMIDE HCL 10 MG PO TABS: 10.0000 mg | ORAL_TABLET | Freq: Once | ORAL | Status: DC | PRN

## 2011-07-07 MED ORDER — LACTATED RINGERS IV SOLN
INTRAVENOUS | Status: DC
  Administered 2011-07-07 (×3): via INTRAVENOUS

## 2011-07-07 MED ORDER — HYDROCODONE-ACETAMINOPHEN 5-500 MG PO TABS: 1.0000 | ORAL_TABLET | Freq: Four times a day (QID) | ORAL | Status: AC | PRN

## 2011-07-07 MED ORDER — SUCCINYLCHOLINE CHLORIDE 20 MG/ML IJ SOLN
INTRAMUSCULAR | Status: AC
  Filled 2011-07-07: qty 1

## 2011-07-07 MED ORDER — HYDROMORPHONE HCL 1 MG/ML IJ SOLN
INTRAMUSCULAR | Status: AC
  Filled 2011-07-07: qty 1

## 2011-07-07 MED ORDER — FENTANYL CITRATE 0.05 MG/ML IJ SOLN
25.0000 ug | INTRAMUSCULAR | Status: DC | PRN
  Administered 2011-07-07 (×2): 25 ug via INTRAVENOUS

## 2011-07-07 MED ORDER — SCOPOLAMINE 1 MG/3DAYS TD PT72: 1.0000 | MEDICATED_PATCH | Freq: Once | TRANSDERMAL | Status: DC | PRN

## 2011-07-07 MED ORDER — ROCURONIUM BROMIDE 100 MG/10ML IV SOLN
INTRAVENOUS | Status: DC | PRN
  Administered 2011-07-07: 25 mg via INTRAVENOUS
  Administered 2011-07-07: 15 mg via INTRAVENOUS
  Administered 2011-07-07: 10 mg via INTRAVENOUS

## 2011-07-07 MED ORDER — PROPOFOL 10 MG/ML IV EMUL
INTRAVENOUS | Status: DC | PRN
  Administered 2011-07-07: 150 mg via INTRAVENOUS

## 2011-07-07 MED ORDER — BUPIVACAINE HCL (PF) 0.25 % IJ SOLN
INTRAMUSCULAR | Status: DC | PRN
  Administered 2011-07-07: 22 mL

## 2011-07-07 MED ORDER — HYDROMORPHONE HCL 1 MG/ML IJ SOLN
INTRAMUSCULAR | Status: DC | PRN
  Administered 2011-07-07 (×2): 0.5 mg via INTRAVENOUS

## 2011-07-07 MED ORDER — DEXAMETHASONE SODIUM PHOSPHATE 10 MG/ML IJ SOLN
INTRAMUSCULAR | Status: AC
  Filled 2011-07-07: qty 1

## 2011-07-07 MED ORDER — LACTATED RINGERS IR SOLN
Status: DC | PRN
  Administered 2011-07-07: 3000 mL

## 2011-07-07 MED ORDER — GLYCOPYRROLATE 0.2 MG/ML IJ SOLN
INTRAMUSCULAR | Status: AC
  Filled 2011-07-07: qty 3

## 2011-07-07 MED ORDER — HEPARIN SODIUM (PORCINE) 5000 UNIT/ML IJ SOLN
5000.0000 [IU] | Freq: Once | INTRAMUSCULAR | Status: AC
  Administered 2011-07-07: 5000 [IU] via SUBCUTANEOUS

## 2011-07-07 MED ORDER — ROCURONIUM BROMIDE 50 MG/5ML IV SOLN
INTRAVENOUS | Status: AC
  Filled 2011-07-07: qty 2

## 2011-07-07 MED ORDER — LIDOCAINE HCL (CARDIAC) 20 MG/ML IV SOLN
INTRAVENOUS | Status: AC
  Filled 2011-07-07: qty 5

## 2011-07-07 MED ORDER — CITRIC ACID-SODIUM CITRATE 334-500 MG/5ML PO SOLN: 30.0000 mL | Freq: Once | ORAL | Status: DC | PRN

## 2011-07-07 MED ORDER — DEXAMETHASONE SODIUM PHOSPHATE 10 MG/ML IJ SOLN
INTRAMUSCULAR | Status: DC | PRN
  Administered 2011-07-07: 10 mg via INTRAVENOUS

## 2011-07-07 MED ORDER — NEOSTIGMINE METHYLSULFATE 1 MG/ML IJ SOLN
INTRAMUSCULAR | Status: AC
  Filled 2011-07-07: qty 10

## 2011-07-07 MED ORDER — FENTANYL CITRATE 0.05 MG/ML IJ SOLN
INTRAMUSCULAR | Status: AC
  Administered 2011-07-07: 25 ug via INTRAVENOUS
  Filled 2011-07-07: qty 2

## 2011-07-07 MED ORDER — PROMETHAZINE HCL 25 MG/ML IJ SOLN: 6.2500 mg | INTRAMUSCULAR | Status: DC | PRN

## 2011-07-07 MED ORDER — PANTOPRAZOLE SODIUM 40 MG PO TBEC: 40.0000 mg | DELAYED_RELEASE_TABLET | Freq: Once | ORAL | Status: DC | PRN

## 2011-07-07 MED ORDER — FAMOTIDINE 20 MG PO TABS: 20.0000 mg | ORAL_TABLET | Freq: Once | ORAL | Status: DC | PRN

## 2011-07-07 MED ORDER — FENTANYL CITRATE 0.05 MG/ML IJ SOLN
INTRAMUSCULAR | Status: DC | PRN
  Administered 2011-07-07: 100 ug via INTRAVENOUS
  Administered 2011-07-07 (×3): 50 ug via INTRAVENOUS

## 2011-07-07 MED ORDER — ONDANSETRON HCL 4 MG/2ML IJ SOLN
INTRAMUSCULAR | Status: DC | PRN
  Administered 2011-07-07: 4 mg via INTRAVENOUS

## 2011-07-07 MED ORDER — SUCCINYLCHOLINE CHLORIDE 20 MG/ML IJ SOLN
INTRAMUSCULAR | Status: DC | PRN
  Administered 2011-07-07: 100 mg via INTRAVENOUS

## 2011-07-07 MED ORDER — NEOSTIGMINE METHYLSULFATE 1 MG/ML IJ SOLN
INTRAMUSCULAR | Status: DC | PRN
  Administered 2011-07-07: 3 mg via INTRAVENOUS

## 2011-07-07 MED ORDER — ACETAMINOPHEN 325 MG PO TABS: 325.0000 mg | ORAL_TABLET | ORAL | Status: DC | PRN

## 2011-07-07 SURGICAL SUPPLY — 77 items
ADH SKN CLS APL DERMABOND .7 (GAUZE/BANDAGES/DRESSINGS) ×3
BAG SPEC RTRVL LRG 6X4 10 (ENDOMECHANICALS)
BAG URINE DRAINAGE (UROLOGICAL SUPPLIES) ×4 IMPLANT
BARRIER ADHS 3X4 INTERCEED (GAUZE/BANDAGES/DRESSINGS) ×2 IMPLANT
BLADELESS LONG 8MM (BLADE) IMPLANT
BRR ADH 4X3 ABS CNTRL BYND (GAUZE/BANDAGES/DRESSINGS) ×3
CABLE HIGH FREQUENCY MONO STRZ (ELECTRODE) ×4 IMPLANT
CATH FOLEY 3WAY  5CC 16FR (CATHETERS) ×1
CATH FOLEY 3WAY 5CC 16FR (CATHETERS) ×3 IMPLANT
CATH ROBINSON RED A/P 16FR (CATHETERS) ×2 IMPLANT
CHLORAPREP W/TINT 26ML (MISCELLANEOUS) ×4 IMPLANT
CLOTH BEACON ORANGE TIMEOUT ST (SAFETY) ×4 IMPLANT
CONT PATH 16OZ SNAP LID 3702 (MISCELLANEOUS) ×2 IMPLANT
COVER MAYO STAND STRL (DRAPES) ×4 IMPLANT
COVER TABLE BACK 60X90 (DRAPES) ×8 IMPLANT
COVER TIP SHEARS 8 DVNC (MISCELLANEOUS) ×3 IMPLANT
COVER TIP SHEARS 8MM DA VINCI (MISCELLANEOUS) ×1
DECANTER SPIKE VIAL GLASS SM (MISCELLANEOUS) ×2 IMPLANT
DERMABOND ADVANCED (GAUZE/BANDAGES/DRESSINGS) ×1
DERMABOND ADVANCED .7 DNX12 (GAUZE/BANDAGES/DRESSINGS) ×3 IMPLANT
DRAPE HUG U DISPOSABLE (DRAPE) ×4 IMPLANT
DRAPE LG THREE QUARTER DISP (DRAPES) ×8 IMPLANT
DRAPE MONITOR DA VINCI (DRAPE) ×4 IMPLANT
DRAPE WARM FLUID 44X44 (DRAPE) ×4 IMPLANT
DRSG COVADERM PLUS 2X2 (GAUZE/BANDAGES/DRESSINGS) ×2 IMPLANT
ELECT LIGASURE LONG (ELECTRODE) IMPLANT
ELECT REM PT RETURN 9FT ADLT (ELECTROSURGICAL) ×4
ELECTRODE REM PT RTRN 9FT ADLT (ELECTROSURGICAL) ×3 IMPLANT
EVACUATOR SMOKE 8.L (FILTER) ×4 IMPLANT
FORCEPS CUTTING 33CM 5MM (CUTTING FORCEPS) IMPLANT
FORCEPS CUTTING 45CM 5MM (CUTTING FORCEPS) IMPLANT
GAUZE VASELINE 3X9 (GAUZE/BANDAGES/DRESSINGS) ×2 IMPLANT
GLOVE BIO SURGEON STRL SZ7 (GLOVE) ×12 IMPLANT
GLOVE BIOGEL PI IND STRL 7.0 (GLOVE) ×11 IMPLANT
GLOVE BIOGEL PI INDICATOR 7.0 (GLOVE) ×3
GLOVE ECLIPSE 6.5 STRL STRAW (GLOVE) ×10 IMPLANT
GOWN PREVENTION PLUS LG XLONG (DISPOSABLE) ×28 IMPLANT
KIT DISP ACCESSORY 4 ARM (KITS) ×2 IMPLANT
MANIPULATOR UTERINE 4.5 ZUMI (MISCELLANEOUS) ×2 IMPLANT
NDL INSUFFLATION 14GA 120MM (NEEDLE) IMPLANT
NEEDLE INSUFFLATION 14GA 120MM (NEEDLE) IMPLANT
NS IRRIG 1000ML POUR BTL (IV SOLUTION) ×2 IMPLANT
OCCLUDER COLPOPNEUMO (BALLOONS) ×2 IMPLANT
PACK LAPAROSCOPY BASIN (CUSTOM PROCEDURE TRAY) ×2 IMPLANT
PACK LAVH (CUSTOM PROCEDURE TRAY) ×4 IMPLANT
PAD PREP 24X48 CUFFED NSTRL (MISCELLANEOUS) ×8 IMPLANT
PLUG CATH AND CAP STER (CATHETERS) ×4 IMPLANT
POUCH SPECIMEN RETRIEVAL 10MM (ENDOMECHANICALS) IMPLANT
SCISSORS LAP 5X35 DISP (ENDOMECHANICALS) IMPLANT
SET CYSTO W/LG BORE CLAMP LF (SET/KITS/TRAYS/PACK) IMPLANT
SET IRRIG TUBING LAPAROSCOPIC (IRRIGATION / IRRIGATOR) ×6 IMPLANT
SOLUTION ELECTROLUBE (MISCELLANEOUS) ×4 IMPLANT
SPONGE GAUZE 2X2 8PLY STRL LF (GAUZE/BANDAGES/DRESSINGS) ×2 IMPLANT
SPONGE LAP 18X18 X RAY DECT (DISPOSABLE) IMPLANT
SUT VIC AB 0 CT1 27 (SUTURE)
SUT VIC AB 0 CT1 27XBRD ANTBC (SUTURE) ×6 IMPLANT
SUT VICRYL 0 UR6 27IN ABS (SUTURE) ×4 IMPLANT
SUT VICRYL 4-0 PS2 18IN ABS (SUTURE) ×8 IMPLANT
SYR 30ML LL (SYRINGE) ×2 IMPLANT
SYR 50ML LL SCALE MARK (SYRINGE) ×2 IMPLANT
SYSTEM CONVERTIBLE TROCAR (TROCAR) ×4 IMPLANT
TIP UTERINE 5.1X6CM LAV DISP (MISCELLANEOUS) IMPLANT
TIP UTERINE 6.7X10CM GRN DISP (MISCELLANEOUS) IMPLANT
TIP UTERINE 6.7X6CM WHT DISP (MISCELLANEOUS) IMPLANT
TIP UTERINE 6.7X8CM BLUE DISP (MISCELLANEOUS) IMPLANT
TOWEL OR 17X24 6PK STRL BLUE (TOWEL DISPOSABLE) ×10 IMPLANT
TRAY FOLEY CATH 14FR (SET/KITS/TRAYS/PACK) ×2 IMPLANT
TROCAR 12M 150ML BLUNT (TROCAR) ×2 IMPLANT
TROCAR BALLN 12MMX100 BLUNT (TROCAR) IMPLANT
TROCAR DISP BLADELESS 8 DVNC (TROCAR) ×3 IMPLANT
TROCAR DISP BLADELESS 8MM (TROCAR) ×1
TROCAR XCEL 12X100 BLDLESS (ENDOMECHANICALS) ×4 IMPLANT
TROCAR XCEL NON-BLD 11X100MML (ENDOMECHANICALS) IMPLANT
TROCAR XCEL NON-BLD 5MMX100MML (ENDOMECHANICALS) ×2 IMPLANT
TUBING FILTER THERMOFLATOR (ELECTROSURGICAL) ×4 IMPLANT
WARMER LAPAROSCOPE (MISCELLANEOUS) ×4 IMPLANT
WATER STERILE IRR 1000ML POUR (IV SOLUTION) ×12 IMPLANT

## 2011-07-07 NOTE — Transfer of Care (Signed)
Immediate Anesthesia Transfer of Care Note  Patient: Heather [name]  Procedure(s) Performed:  ROBOTIC ASSISTED LAPAROSCOPIC LYSIS OF ADHESION; EXCISION VAGINAL LESION; LAPAROSCOPY DIAGNOSTIC  Patient Location: PACU  Anesthesia Type: General  Level of Consciousness: awake, alert  and oriented  Airway & Oxygen Therapy: Patient Spontanous Breathing and Patient connected to nasal cannula oxygen  Post-op Assessment: Report given to PACU RN and Post -op Vital signs reviewed and stable  Post vital signs: stable  Complications: No apparent anesthesia complications

## 2011-07-07 NOTE — Anesthesia Procedure Notes (Signed)
Procedure Name: Intubation Date/Time: 07/07/2011 7:47 AM Performed by: Karleen Dolphin Pre-anesthesia Checklist: Emergency Drugs available, Timeout performed, Suction available, Patient identified and Patient being monitored Patient Re-evaluated:Patient Re-evaluated prior to inductionOxygen Delivery Method: Circle System Utilized Preoxygenation: Pre-oxygenation with 100% oxygen Intubation Type: IV induction Ventilation: Mask ventilation without difficulty Grade View: Grade I Tube size: 7.0 mm Number of attempts: 1 Airway Equipment and Method: video-laryngoscopy and stylet Placement Confirmation: ETT inserted through vocal cords under direct vision,  positive ETCO2 and breath sounds checked- equal and bilateral Secured at: 21 cm Tube secured with: Tape Dental Injury: Teeth and Oropharynx as per pre-operative assessment  Difficulty Due To: Difficulty was anticipated

## 2011-07-07 NOTE — Op Note (Signed)
Preoperative diagnosis: Pelvic pain                                        Dyspareunia, postcoital bleeding                                        Thrombocytopenia, Factor V Leiden deficiency, weight loss. Postop diagnosis: Same, Bowel adhesions to vaginal cuff, 4 mm vaginal lesion at fornix.  Procedure: Diagnostic laparoscopy, da Vinci robot assisted enterolysis, excision of vaginal lesion.  Anesthesia Gen. Endotracheal Surgeon: Dr. Shea Evans Asstistant. Dr Nelta Numbers IV fluids 2000 cc LR EBL 10 cc Urine output 25 cc Complications none Pathology: Vaginal wall lesion Disposition: PACU, stable. Findings: Small bowel adhesions to vaginal cuff and right lateral wall. Normal peristalsing ureters. Normal liver, appendix normal after released from right pelvic wall adhesions.   Procedure:  Indication: 40 yo, with prior TAH/BSO in 2008. C/o postcoital bleeding, excessive foul vaginal discharge, dyspareunia, pelvic pain. Normal pelvic sono and CT scan. Medical management failed, options reviewed, plan was made for diagnostic laparoscopy, possible operative laparoscopy after reviewing risks and complications.   Complications of surgery including infection, bleeding, damage to internal organs, especially due to prior  abdominal surgery and other surgery related problems including pneumonia, VTE reviewed and informed written consent was obtained. Patient was brought to the operating room with IV running.  Underwent general anesthesia without difficulty and was given dorsal lithotomy position, prepped and draped in sterile fashion. 3-way foley catheter was placed. Exam of vagina revealed 3-4 mm area in left fornix/ cuff area of nodularity. Sponge stick was placed for manipulation.  Attention was focused on abdomen. Entry sites were marked. Open laparoscopy was performed at supraumbilical area after injecting 0.25% marcaine, skin incised with scalpel, fascia grasped with Kochers and incision, peritoneum  grasped and incised and intrabdominal entry confirmed. 0- Vicryl sutures placed in purse string fashion on fascia and Hassan cannula introduced, secured with stay sutures. Pneumoperitoneum was begun. Laparoscope was introduced and the peritoneal cavity was evaluated. No abdominal injuries, lover appeared normal. No omental masses noted. Trendelenburg revealed 2 small bowel loops adhesed to vaginal cuff and small and large bowel adhesed to right pelvic wall and minor adhesions to left pelvic wall and left cul de sac.  Marcaine was injected after mapping the skin for 3 other trocars, one 8 mm incision on right and left for robotic arms and one 5 mm incision in RLQ for assistant. All trocars introduced under vision and daVinci Robot was docked. PK with bipolar in arm 1 and scissors with monopolar in arm 2 were introduced.   Dr. Juliene Pina scrubbed out and went for surgical console. In stepwise fashion enterolysis was done by creating clear windows in the peritoneum and in the adhesions and all bowel adhesions from vaginal cuff and pelvic cavity were released using cold scissors. Bleeding was minimal and did not need cauterization except in one area on peritoneum. Right ureter was seen well, left was seen peristalsing. No bladder or bowel injuries noted. Irrigation was performed, all dissected areas appeared to be hemostatic. Robotic instruments were removed. Robot was de-docked.   Laparoscope was reintroduced to guide vaginal exam under anesthesia and excise vaginal lesion. Vaginal cuff and fornix were exposed with 2 Devers. 3-4 mm area noted to have irregularity  and was grasped with Allis and excised with scissors, sent to path. Bleeding was controlled with silver nitrate stick. No peritoneal entry was noted while excising the mass. But vaginal wall is noted to be  very thin and atrophied (due to lack of estorgen) with complete loss of rugosity.  Dr Juliene Pina scrubbed back to complete laparoscopy. Hemostasis was excellent.  Interseed was placed at the vaginal cuff in the main area of bowel adhesions. All ports were removed under vision. Laparoscope and central port removed under vision. The stay sutures at the fascia at the central port were tied and the closure was excellent of the skin sutures were closed using 3-0 Vicryl. 9 cc marcaine injected in all port sites.  Dermabond was applied on top of all. No vaginal bleeding noted per vaginal exam at the end. All instruments/lap/sponges counts were correct x2. Foley was removed. No complications. Patient tolerated procedure well, was reversed from anesthesia and brought to the PACU stable condition and will be discharged home from there. . Dr Juliene Pina was the surgeon for entire case.

## 2011-07-07 NOTE — Anesthesia Postprocedure Evaluation (Signed)
Anesthesia Post Note  Patient: Heather [name]  Procedure(s) Performed:  ROBOTIC ASSISTED LAPAROSCOPIC LYSIS OF ADHESION; EXCISION VAGINAL LESION; LAPAROSCOPY DIAGNOSTIC  Anesthesia type: GA  Patient location: PACU  Post pain: Pain level controlled  Post assessment: Post-op Vital signs reviewed  Last Vitals:  Filed Vitals:   07/07/11 1000  BP: 119/77  Pulse: 76  Temp:   Resp: 16    Post vital signs: Reviewed  Level of consciousness: sedated  Complications: No apparent anesthesia complications

## 2011-07-07 NOTE — Anesthesia Preprocedure Evaluation (Addendum)
Anesthesia Evaluation  Name, MR# and DOB Patient awake  General Assessment Comment  Reviewed: Allergy & Precautions, H&P , NPO status , Patient's Chart, lab work & pertinent test results, reviewed documented beta blocker date and time   History of Anesthesia Complications (+) DIFFICULT AIRWAY  Airway Mallampati: III TM Distance: >3 FB Neck ROM: full   Comment: Trachea appears anterior Dental  (+) Partial Lower and Chipped   Pulmonary Current Smoker (1 ppd)  clear to auscultation        Cardiovascular Exercise Tolerance: Good regular Normal Has had CP in past - told stress, non-cardiac.   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   Dental Advisory Given  Plan Discussed with: CRNA and Surgeon  Anesthesia Plan Comments:        Anesthesia Quick Evaluation

## 2011-07-10 ENCOUNTER — Encounter (HOSPITAL_COMMUNITY): Payer: Self-pay | Admitting: Obstetrics & Gynecology

## 2011-07-17 LAB — POCT CARDIAC MARKERS
CKMB, poc: <1 — ABNORMAL LOW
CKMB, poc: <1 — ABNORMAL LOW
Myoglobin, poc: 35.5
Operator id: 146091
Operator id: 198171
Operator id: 234501
Troponin i, poc: <0.05
Troponin i, poc: <0.05
Troponin i, poc: <0.05

## 2011-07-17 LAB — I-STAT 8, (EC8 V) (CONVERTED LAB)
Bicarbonate: 25.5 — ABNORMAL HIGH
HCT: 39
Hemoglobin: 13.3
Operator id: 198171
Potassium: 3.7
Sodium: 140
TCO2: 27

## 2011-07-17 LAB — URINALYSIS, ROUTINE W REFLEX MICROSCOPIC
Glucose, UA: NEGATIVE
Hgb urine dipstick: NEGATIVE
Ketones, ur: NEGATIVE
Protein, ur: NEGATIVE
Urobilinogen, UA: 0.2

## 2011-07-17 LAB — POCT I-STAT CREATININE: Operator id: 198171

## 2012-11-08 ENCOUNTER — Encounter (HOSPITAL_COMMUNITY): Payer: Self-pay | Admitting: *Deleted

## 2012-11-08 ENCOUNTER — Emergency Department (HOSPITAL_COMMUNITY)
Admission: EM | Admit: 2012-11-08 | Discharge: 2012-11-08 | Disposition: A | Discharge status: Home / Self Care | Payer: 59 | Attending: Emergency Medicine | Admitting: Emergency Medicine

## 2012-11-08 ENCOUNTER — Emergency Department (HOSPITAL_COMMUNITY): Payer: 59

## 2012-11-08 ENCOUNTER — Other Ambulatory Visit: Payer: Self-pay

## 2012-11-08 DIAGNOSIS — F17200 Nicotine dependence, unspecified, uncomplicated: Secondary | ICD-10-CM | POA: Insufficient documentation

## 2012-11-08 DIAGNOSIS — Z872 Personal history of diseases of the skin and subcutaneous tissue: Secondary | ICD-10-CM | POA: Insufficient documentation

## 2012-11-08 DIAGNOSIS — R0789 Other chest pain: Secondary | ICD-10-CM

## 2012-11-08 DIAGNOSIS — R209 Unspecified disturbances of skin sensation: Secondary | ICD-10-CM | POA: Insufficient documentation

## 2012-11-08 DIAGNOSIS — R071 Chest pain on breathing: Secondary | ICD-10-CM | POA: Insufficient documentation

## 2012-11-08 DIAGNOSIS — R0602 Shortness of breath: Secondary | ICD-10-CM | POA: Insufficient documentation

## 2012-11-08 LAB — CBC
Hemoglobin: 13 g/dL (ref 12.0–15.0)
MCH: 31.9 pg (ref 26.0–34.0)
MCHC: 33.7 g/dL (ref 30.0–36.0)
Platelets: 124 10*3/uL — ABNORMAL LOW (ref 150–400)
RDW: 13.9 % (ref 11.5–15.5)

## 2012-11-08 LAB — DIFFERENTIAL
Basophils Relative: 1 % (ref 0–1)
Eosinophils Relative: 1 % (ref 0–5)
Monocytes Absolute: 0.5 10*3/uL (ref 0.1–1.0)
Monocytes Relative: 6 % (ref 3–12)
Neutro Abs: 5.6 10*3/uL (ref 1.7–7.7)

## 2012-11-08 LAB — CBC WITH DIFFERENTIAL/PLATELET

## 2012-11-08 LAB — POCT I-STAT TROPONIN I

## 2012-11-08 LAB — BASIC METABOLIC PANEL
Calcium: 9.2 mg/dL (ref 8.4–10.5)
GFR calc Af Amer: >90 mL/min (ref >90)
GFR calc non Af Amer: >90 mL/min (ref >90)
Glucose, Bld: 110 mg/dL — ABNORMAL HIGH (ref 70–99)
Sodium: 138 mEq/L (ref 135–145)

## 2012-11-08 MED ORDER — HYDROCODONE-ACETAMINOPHEN 5-325 MG PO TABS
2.0000 | ORAL_TABLET | Freq: Once | ORAL | Status: AC
  Administered 2012-11-08: 2 via ORAL
  Filled 2012-11-08: qty 2

## 2012-11-08 MED ORDER — HYDROCODONE-ACETAMINOPHEN 5-325 MG PO TABS: 2.0000 | ORAL_TABLET | ORAL | Status: DC | PRN

## 2012-11-08 NOTE — ED Notes (Signed)
To ED for eval of left side CP and SOB since waking this am. Pain woke pt. Deep breath and exertion makes pain worse. Also complains of 4th and 5th fingers on left hand tingling. No vomiting but very nauseous.

## 2012-11-08 NOTE — ED Notes (Signed)
States that her pain is a 4/10 at the present.  No distress noted.

## 2012-11-08 NOTE — ED Provider Notes (Addendum)
History     CSN: [id number]  Arrival date & time 11/08/12  1009   First MD Initiated Contact with Patient 11/08/12 1047      Chief Complaint  Patient presents with  . Chest Pain  . Shortness of Breath    (Consider location/radiation/quality/duration/timing/severity/associated sxs/prior treatment) HPI Complains of left anterior chest pain, accompanied by mild shortness of breath gradual onset 1 AM today. Pain worse with talking or changing position, pleuritic in nature. Pain radiates to left shoulder associated symptoms include tingling in the fourth and fifth fingers of her left hand. No other complaint . treatment prior to coming here Past Medical History  Diagnosis Date  . Difficult intubation     pt was told difficult intubation at [facility]  . Blood dyscrasia   . Factor V deficiency     protein deficiency    Past Surgical History  Procedure Date  . Abdominal hysterectomy   . Cesarean section     x 2  . Lesion removal 07/07/2011    Procedure: EXCISION VAGINAL LESION;  Surgeon: Robley Fries;  Location: [facility];  Service: Gynecology;  Laterality: N/A;  . Laparoscopy 07/07/2011    Procedure: LAPAROSCOPY DIAGNOSTIC;  Surgeon: Robley Fries;  Location: [facility];  Service: Gynecology;  Laterality: N/A;    History reviewed. No pertinent family history.  History  Substance Use Topics  . Smoking status: Current Every Day Smoker -- 1.0 packs/day for 28 years    Types: Cigarettes  . Smokeless tobacco: Never Used  . Alcohol Use: Yes     Comment: socially once a month - mixed drinks    OB History    Grav Para Term Preterm Abortions TAB SAB Ect Mult Living                  Review of Systems  Constitutional: Negative.   HENT: Negative.   Respiratory: Positive for shortness of breath.   Cardiovascular: Positive for chest pain.  Gastrointestinal: Negative.   Musculoskeletal: Negative.   Skin: Negative.   Neurological: Negative.   Hematological: Negative.    Psychiatric/Behavioral: Negative.   All other systems reviewed and are negative.    Allergies  Aspirin and Contrast media  Home Medications  No current outpatient prescriptions on file.  BP 108/80  Pulse 80  Temp 98.1 F (36.7 C) (Oral)  Resp 19  SpO2 100%  Physical Exam  Nursing note and vitals reviewed. Constitutional: She appears well-developed and well-nourished.  HENT:  Head: Normocephalic and atraumatic.  Eyes: Conjunctivae normal are normal. Pupils are equal, round, and reactive to light.  Neck: Neck supple. No tracheal deviation present. No thyromegaly present.  Cardiovascular: Normal rate and regular rhythm.   No murmur heard. Pulmonary/Chest: Effort normal and breath sounds normal. She exhibits tenderness.       Left chest wall tender, reproducing pain exactly  Abdominal: Soft. Bowel sounds are normal. She exhibits no distension. There is no tenderness.  Musculoskeletal: Normal range of motion. She exhibits no edema and no tenderness.  Neurological: She is alert. Coordination normal.  Skin: Skin is warm and dry. No rash noted.  Psychiatric: She has a normal mood and affect.    ED Course  Procedures (including critical care time)  Labs Reviewed  CBC - Abnormal; Notable for the following:    Platelets 124 (*)     All other components within normal limits  BASIC METABOLIC PANEL - Abnormal; Notable for the following:    Glucose, Bld 110 (*)  All other components within normal limits  POCT I-STAT TROPONIN I  CBC WITH DIFFERENTIAL  D-DIMER, QUANTITATIVE   No results found.   No diagnosis found.   Date: 11/08/2012  Rate: 90  Rhythm: normal sinus rhythm  QRS Axis: normal  Intervals: normal  ST/T Wave abnormalities: normal  Conduction Disutrbances: none  Narrative Interpretation: unremarkable Results for orders placed during the hospital encounter of 11/08/12  CBC      Component Value Range   WBC 7.5  4.0 - 10.5 K/uL   RBC 4.08  3.87 - 5.11  MIL/uL   Hemoglobin 13.0  12.0 - 15.0 g/dL   HCT 78.4  69.6 - 29.5 %   MCV 94.6  78.0 - 100.0 fL   MCH 31.9  26.0 - 34.0 pg   MCHC 33.7  30.0 - 36.0 g/dL   RDW 28.4  13.2 - 44.0 %   Platelets 124 (*) 150 - 400 K/uL  BASIC METABOLIC PANEL      Component Value Range   Sodium 138  135 - 145 mEq/L   Potassium 3.6  3.5 - 5.1 mEq/L   Chloride 102  96 - 112 mEq/L   CO2 25  19 - 32 mEq/L   Glucose, Bld 110 (*) 70 - 99 mg/dL   BUN 9  6 - 23 mg/dL   Creatinine, Ser 1.02  0.50 - 1.10 mg/dL   Calcium 9.2  8.4 - 72.5 mg/dL   GFR calc non Af Amer >90  >90 mL/min   GFR calc Af Amer >90  >90 mL/min  POCT I-STAT TROPONIN I      Component Value Range   Troponin i, poc 0.00  0.00 - 0.08 ng/mL   Comment 3           CBC WITH DIFFERENTIAL      Component Value Range   WBC DUPLICATE REQUEST     RBC DUPLICATE REQUEST     Hemoglobin DUPLICATE REQUEST     HCT DUPLICATE REQUEST     MCV DUPLICATE REQUEST     MCH DUPLICATE REQUEST     MCHC DUPLICATE REQUEST     RDW DUPLICATE REQUEST     Platelets DUPLICATE REQUEST     Neutrophils Relative PENDING     Neutro Abs PENDING     Band Neutrophils PENDING     Lymphocytes Relative PENDING     Lymphs Abs PENDING     Monocytes Relative PENDING     Monocytes Absolute PENDING     Eosinophils Relative PENDING     Eosinophils Absolute PENDING     Basophils Relative PENDING     Basophils Absolute PENDING     LUCs, % PENDING     LUC, Absolute PENDING     WBC Morphology PENDING     RBC Morphology PENDING     Smear Review PENDING     Other PENDING     Other 2 PENDING     nRBC PENDING     Metamyelocytes Relative PENDING     Myelocytes PENDING     Promyelocytes Absolute PENDING     Blasts PENDING    D-DIMER, QUANTITATIVE      Component Value Range   D-Dimer, Quant <0.27  0.00 - 0.48 ug/mL-FEU  DIFFERENTIAL      Component Value Range   Neutrophils Relative 70  43 - 77 %   Neutro Abs 5.6  1.7 - 7.7 K/uL   Lymphocytes Relative 22  12 - 46 %  Lymphs Abs  1.8  0.7 - 4.0 K/uL   Monocytes Relative 6  3 - 12 %   Monocytes Absolute 0.5  0.1 - 1.0 K/uL   Eosinophils Relative 1  0 - 5 %   Eosinophils Absolute 0.1  0.0 - 0.7 K/uL   Basophils Relative 1  0 - 1 %   Basophils Absolute 0.0  0.0 - 0.1 K/uL   Dg Chest 2 View  11/08/2012  *RADIOLOGY REPORT*  Clinical Data: 42 year old female with sharp mid chest pain. Shortness of breath.  CHEST - 2 VIEW  Comparison: 12/30/2010.  Findings: Stable lung volumes.  Cardiac size and mediastinal contours are within normal limits.  Visualized tracheal air column is within normal limits.  Stable mild apical scarring.  No pneumothorax, pulmonary edema, pleural effusion or acute pulmonary opacity.  Mild breast attenuation artifact over the lower lungs. No acute osseous abnormality identified.  IMPRESSION: No acute cardiopulmonary abnormality.   Original Report Authenticated By: Erskine Speed, M.D.     Unchanged from 04/23/2007 interpreted by me Chest x-ray reviewed by me 22 5 PM pain improved after treatment with Norco. MDM  Thrombocytopenia is chronic Doubt acute coronary syndrome given the highly atypical symptoms, not in acute EKG and normal troponin after several hours of symptoms. Doubt pulmonary:low pretest clinical probability negative d-dimer Symptoms consistent with musculoskeletal chest pain Plan patient to stop smoking Followup with primary care physician at [facility] Prescription Norco Diagnosis #1 chest wall pain #2 tobacco abuse  #3 thrombocytopenia    Doug Sou, MD 11/08/12 1430  Doug Sou, MD 11/08/12 1432

## 2013-05-28 ENCOUNTER — Other Ambulatory Visit (HOSPITAL_COMMUNITY): Payer: Self-pay | Admitting: Internal Medicine

## 2013-05-28 DIAGNOSIS — R0789 Other chest pain: Secondary | ICD-10-CM

## 2013-05-30 ENCOUNTER — Ambulatory Visit (HOSPITAL_COMMUNITY)
Admission: RE | Admit: 2013-05-30 | Discharge: 2013-05-30 | Disposition: A | Discharge status: Home / Self Care | Payer: 59 | Source: Ambulatory Visit | Attending: Cardiovascular Disease | Admitting: Cardiovascular Disease

## 2013-05-30 DIAGNOSIS — R002 Palpitations: Secondary | ICD-10-CM | POA: Insufficient documentation

## 2013-05-30 DIAGNOSIS — R079 Chest pain, unspecified: Secondary | ICD-10-CM | POA: Insufficient documentation

## 2013-05-30 DIAGNOSIS — F17200 Nicotine dependence, unspecified, uncomplicated: Secondary | ICD-10-CM | POA: Insufficient documentation

## 2013-05-30 DIAGNOSIS — Z8249 Family history of ischemic heart disease and other diseases of the circulatory system: Secondary | ICD-10-CM | POA: Insufficient documentation

## 2013-05-30 DIAGNOSIS — R42 Dizziness and giddiness: Secondary | ICD-10-CM | POA: Insufficient documentation

## 2013-05-30 DIAGNOSIS — R0789 Other chest pain: Secondary | ICD-10-CM

## 2013-05-30 DIAGNOSIS — R5381 Other malaise: Secondary | ICD-10-CM | POA: Insufficient documentation

## 2013-05-30 DIAGNOSIS — R0609 Other forms of dyspnea: Secondary | ICD-10-CM | POA: Insufficient documentation

## 2013-05-30 DIAGNOSIS — R0989 Other specified symptoms and signs involving the circulatory and respiratory systems: Secondary | ICD-10-CM | POA: Insufficient documentation

## 2013-05-30 MED ORDER — TECHNETIUM TC 99M SESTAMIBI GENERIC - CARDIOLITE
10.9000 | Freq: Once | INTRAVENOUS | Status: AC | PRN
  Administered 2013-05-30: 10.9 via INTRAVENOUS

## 2013-05-30 MED ORDER — TECHNETIUM TC 99M SESTAMIBI GENERIC - CARDIOLITE
30.2000 | Freq: Once | INTRAVENOUS | Status: AC | PRN
  Administered 2013-05-30: 30.2 via INTRAVENOUS

## 2013-05-30 NOTE — Procedures (Addendum)
[place]  CARDIOVASCULAR IMAGING [address] [place] [address] [place] [address] [phone]  Cardiology Nuclear Med Study  Heather [name] is a 42 y.o. female     MRN : [id number]     DOB: March 20, 1971  Procedure Date: 05/30/2013  Nuclear Med Background Indication for Stress Test:  Evaluation for Ischemia History:  no prior history reported Cardiac Risk Factors: Family History - CAD and Smoker  Symptoms:  Chest Pain, Dizziness, DOE, Fatigue, Light-Headedness, Palpitations and SOB   Nuclear Pre-Procedure Caffeine/Decaff Intake:  12:00am NPO After: 10AM   IV Site: R Forearm  IV 0.9% NS with Angio Cath:  22g  Chest Size (in):  N/A  IV Started by: Emmit Pomfret, RN  Height: 5\' 3"  (1.6 m)  Cup Size: B  BMI:  Body mass index is 18.07 kg/(m^2). Weight:  102 lb (46.267 kg)   Tech Comments:  N/A    Nuclear Med Study 1 or 2 day study: 1 day  Stress Test Type:  Stress  Order Authorizing Provider:  Alphonsus Sias, MD   Resting Radionuclide: Technetium 56m Sestamibi  Resting Radionuclide Dose: 10.9 mCi   Stress Radionuclide:  Technetium 62m Sestamibi  Stress Radionuclide Dose: 30.2 mCi           Stress Protocol Rest HR: 96 Stress HR: 179  Rest BP: 113/88 Stress BP: 156/92  Exercise Time (min): 7:41 METS: 9.50   Predicted Max HR: 177 bpm % Max HR: 101.13 bpm Rate Pressure Product: 25366  Dose of Adenosine (mg):  n/a Dose of Lexiscan: n/a mg  Dose of Atropine (mg): n/a Dose of Dobutamine: n/a mcg/kg/min (at max HR)  Stress Test Technologist: Ernestene Mention, CCT Nuclear Technologist: Gonzella Lex, CNMT   Rest Procedure:  Myocardial perfusion imaging was performed at rest 45 minutes following the intravenous administration of Technetium 60m Sestamibi. Stress Procedure:  The patient performed treadmill exercise using a Bruce  Protocol for 7 minutes and 41 seconds. The patient stopped due to shortness of breath, fatigue and dizziness. Patient complained of some  chest pain above her left breast before starting the test but denied any increasing chest pain during exercise and recovery.  There were no significant ST-T wave changes.  Technetium 56m Sestamibi was injected at peak exercise and myocardial perfusion imaging was performed after a brief delay.  Transient Ischemic Dilatation (Normal <1.22):  0.89 Lung/Heart Ratio (Normal <0.45):  0.28 QGS EDV:  55 ml QGS ESV:  17 ml LV Ejection Fraction: 69%  Signed by      Rest ECG: NSR - Normal EKG  Stress ECG: No significant change from baseline ECG  QPS Raw Data Images:  Normal; no motion artifact; normal heart/lung ratio. Stress Images:  Normal homogeneous uptake in all areas of the myocardium. Rest Images:  Normal homogeneous uptake in all areas of the myocardium. Subtraction (SDS):  Normal  Impression Exercise Capacity:  Good exercise capacity. BP Response:  Normal blood pressure response. Clinical Symptoms:  No significant symptoms noted. ECG Impression:  No significant ST segment change suggestive of ischemia. Comparison with Prior Nuclear Study: No images to compare  Overall Impression:  Normal stress nuclear study.  LV Wall Motion:  NL LV Function, EF 69%; NL Wall Motion   KELLY,THOMAS A, MD  05/31/2013 9:24 AM

## 2014-04-02 ENCOUNTER — Other Ambulatory Visit: Payer: Self-pay | Admitting: Internal Medicine

## 2014-04-02 ENCOUNTER — Encounter: Payer: Self-pay | Admitting: Gastroenterology

## 2014-04-02 ENCOUNTER — Encounter: Payer: Self-pay | Admitting: Internal Medicine

## 2014-04-02 ENCOUNTER — Ambulatory Visit (INDEPENDENT_AMBULATORY_CARE_PROVIDER_SITE_OTHER): Payer: 59 | Admitting: Internal Medicine

## 2014-04-02 VITALS — BP 111/72 | HR 93 | Temp 97.5°F | Ht 63.9 in | Wt 106.1 lb

## 2014-04-02 DIAGNOSIS — F17200 Nicotine dependence, unspecified, uncomplicated: Secondary | ICD-10-CM | POA: Insufficient documentation

## 2014-04-02 DIAGNOSIS — R112 Nausea with vomiting, unspecified: Secondary | ICD-10-CM

## 2014-04-02 DIAGNOSIS — R05 Cough: Secondary | ICD-10-CM

## 2014-04-02 DIAGNOSIS — J439 Emphysema, unspecified: Secondary | ICD-10-CM | POA: Insufficient documentation

## 2014-04-02 DIAGNOSIS — R634 Abnormal weight loss: Secondary | ICD-10-CM | POA: Insufficient documentation

## 2014-04-02 DIAGNOSIS — Z0000 Encounter for general adult medical examination without abnormal findings: Secondary | ICD-10-CM

## 2014-04-02 DIAGNOSIS — Z122 Encounter for screening for malignant neoplasm of respiratory organs: Secondary | ICD-10-CM | POA: Insufficient documentation

## 2014-04-02 DIAGNOSIS — R059 Cough, unspecified: Secondary | ICD-10-CM | POA: Insufficient documentation

## 2014-04-02 DIAGNOSIS — J432 Centrilobular emphysema: Secondary | ICD-10-CM | POA: Insufficient documentation

## 2014-04-02 LAB — CBC WITH DIFFERENTIAL/PLATELET
BASOS ABS: 0.1 10*3/uL (ref 0.0–0.1)
BASOS PCT: 1 % (ref 0–1)
EOS ABS: 0.1 10*3/uL (ref 0.0–0.7)
Eosinophils Relative: 1 % (ref 0–5)
HCT: 39.4 % (ref 36.0–46.0)
HEMOGLOBIN: 13.8 g/dL (ref 12.0–15.0)
Lymphocytes Relative: 19 % (ref 12–46)
Lymphs Abs: 1.2 10*3/uL (ref 0.7–4.0)
MCH: 30.8 pg (ref 26.0–34.0)
MCHC: 35 g/dL (ref 30.0–36.0)
MCV: 87.9 fL (ref 78.0–100.0)
MONOS PCT: 8 % (ref 3–12)
Monocytes Absolute: 0.5 10*3/uL (ref 0.1–1.0)
NEUTROS ABS: 4.4 10*3/uL (ref 1.7–7.7)
NEUTROS PCT: 71 % (ref 43–77)
PLATELETS: 134 10*3/uL — AB (ref 150–400)
RBC: 4.48 MIL/uL (ref 3.87–5.11)
RDW: 14.2 % (ref 11.5–15.5)
WBC: 6.2 10*3/uL (ref 4.0–10.5)

## 2014-04-02 LAB — COMPLETE METABOLIC PANEL WITH GFR
ALBUMIN: 4.9 g/dL (ref 3.5–5.2)
ALK PHOS: 47 U/L (ref 39–117)
ALT: 11 U/L (ref 0–35)
AST: 15 U/L (ref 0–37)
BILIRUBIN TOTAL: 0.5 mg/dL (ref 0.2–1.2)
BUN: 9 mg/dL (ref 6–23)
CO2: 28 mEq/L (ref 19–32)
Calcium: 9.7 mg/dL (ref 8.4–10.5)
Chloride: 104 mEq/L (ref 96–112)
Creat: 0.71 mg/dL (ref 0.50–1.10)
GFR, Est African American: >89 mL/min
GFR, Est Non African American: >89 mL/min
GLUCOSE: 99 mg/dL (ref 70–99)
POTASSIUM: 4.2 meq/L (ref 3.5–5.3)
SODIUM: 139 meq/L (ref 135–145)
TOTAL PROTEIN: 7.2 g/dL (ref 6.0–8.3)

## 2014-04-02 LAB — LIPID PANEL
CHOL/HDL RATIO: 2.7 ratio
Cholesterol: 184 mg/dL (ref 0–200)
HDL: 67 mg/dL (ref >39)
LDL Cholesterol: 107 mg/dL — ABNORMAL HIGH (ref 0–99)
Triglycerides: 50 mg/dL (ref <150)
VLDL: 10 mg/dL (ref 0–40)

## 2014-04-02 LAB — TSH: TSH: 1.634 u[IU]/mL (ref 0.350–4.500)

## 2014-04-02 LAB — HEMOGLOBIN A1C
HEMOGLOBIN A1C: 5.7 % — AB (ref <5.7)
MEAN PLASMA GLUCOSE: 117 mg/dL — AB (ref <117)

## 2014-04-02 NOTE — Assessment & Plan Note (Signed)
  Assessment: Progress toward smoking cessation:    still smoking 1 PPD Barriers to progress toward smoking cessation:   has been unable to afford chantix in the past Comments: Extensive discussion held with patient and she is motivated to quit  Plan: Instruction/counseling given:  I counseled patient on the dangers of tobacco use, advised patient to stop smoking, and reviewed strategies to maximize success. Educational resources provided:  [employer] [employer] account manager) brochure Self management tools provided:  smoking cessation plan (STAR Quit Plan) Medications to assist with smoking cessation:  None and patient wants to attempt quitting without medications at this time Patient agreed to the following self-care plans for smoking cessation:    Other plans: patient to follow up in 2 weeks and if no success may be willing to try medications

## 2014-04-02 NOTE — Patient Instructions (Signed)
Thank you for following up in our clinic today. It was a pleasure to meet you I have referred you to gastroenterology for follow up given your significant weight loss and early satiety I have ordered a mammogram for health maintenance We will follow up the lab work done today and notify you of the results I have also ordered a CT scan of the chest given your cough, significant weight loss and smoking history We have discussed smoking cessation. I encourage you to try and quit smoking and will provide you with any resources you may need   Smoking Cessation Quitting smoking is important to your health and has many advantages. However, it is not always easy to quit since nicotine is a very addictive drug. Often times, people try 3 times or more before being able to quit. This document explains the best ways for you to prepare to quit smoking. Quitting takes hard work and a lot of effort, but you can do it. ADVANTAGES OF QUITTING SMOKING  You will live longer, feel better, and live better.  Your body will feel the impact of quitting smoking almost immediately.  Within 20 minutes, blood pressure decreases. Your pulse returns to its normal level.  After 8 hours, carbon monoxide levels in the blood return to normal. Your oxygen level increases.  After 24 hours, the chance of having a heart attack starts to decrease. Your breath, hair, and body stop smelling like smoke.  After 48 hours, damaged nerve endings begin to recover. Your sense of taste and smell improve.  After 72 hours, the body is virtually free of nicotine. Your bronchial tubes relax and breathing becomes easier.  After 2 to 12 weeks, lungs can hold more air. Exercise becomes easier and circulation improves.  The risk of having a heart attack, stroke, cancer, or lung disease is greatly reduced.  After 1 year, the risk of coronary heart disease is cut in half.  After 5 years, the risk of stroke falls to the same as a  nonsmoker.  After 10 years, the risk of lung cancer is cut in half and the risk of other cancers decreases significantly.  After 15 years, the risk of coronary heart disease drops, usually to the level of a nonsmoker.  If you are pregnant, quitting smoking will improve your chances of having a healthy baby.  The people you live with, especially any children, will be healthier.  You will have extra money to spend on things other than cigarettes. QUESTIONS TO THINK ABOUT BEFORE ATTEMPTING TO QUIT You may want to talk about your answers with your caregiver.  Why do you want to quit?  If you tried to quit in the past, what helped and what did not?  What will be the most difficult situations for you after you quit? How will you plan to handle them?  Who can help you through the tough times? Your family? Friends? A caregiver?  What pleasures do you get from smoking? What ways can you still get pleasure if you quit? Here are some questions to ask your caregiver:  How can you help me to be successful at quitting?  What medicine do you think would be best for me and how should I take it?  What should I do if I need more help?  What is smoking withdrawal like? How can I get information on withdrawal? GET READY  Set a quit date.  Change your environment by getting rid of all cigarettes, ashtrays, matches, and lighters  in your home, car, or work. Do not let people smoke in your home.  Review your past attempts to quit. Think about what worked and what did not. GET SUPPORT AND ENCOURAGEMENT You have a better chance of being successful if you have help. You can get support in many ways.  Tell your family, friends, and co-workers that you are going to quit and need their support. Ask them not to smoke around you.  Get individual, group, or telephone counseling and support. Programs are available at [employer] and health centers. Call your local health department for information  about programs in your area.  Spiritual beliefs and practices may help some smokers quit.  Download a "quit meter" on your computer to keep track of quit statistics, such as how long you have gone without smoking, cigarettes not smoked, and money saved.  Get a self-help book about quitting smoking and staying off of tobacco. [place] yourself from urges to smoke. Talk to someone, go for a walk, or occupy your time with a task.  Change your normal routine. Take a different route to work. Drink tea instead of coffee. Eat breakfast in a different place.  Reduce your stress. Take a hot bath, exercise, or read a book.  Plan something enjoyable to do every day. Reward yourself for not smoking.  Explore interactive web-based programs that specialize in helping you quit. GET MEDICINE AND USE IT CORRECTLY Medicines can help you stop smoking and decrease the urge to smoke. Combining medicine with the above behavioral methods and support can greatly increase your chances of successfully quitting smoking.  Nicotine replacement therapy helps deliver nicotine to your body without the negative effects and risks of smoking. Nicotine replacement therapy includes nicotine gum, lozenges, inhalers, nasal sprays, and skin patches. Some may be available over-the-counter and others require a prescription.  Antidepressant medicine helps people abstain from smoking, but how this works is unknown. This medicine is available by prescription.  Nicotinic receptor partial agonist medicine simulates the effect of nicotine in your brain. This medicine is available by prescription. Ask your caregiver for advice about which medicines to use and how to use them based on your health history. Your caregiver will tell you what side effects to look out for if you choose to be on a medicine or therapy. Carefully read the information on the package. Do not use any other product containing nicotine  while using a nicotine replacement product.  RELAPSE OR DIFFICULT SITUATIONS Most relapses occur within the first 3 months after quitting. Do not be discouraged if you start smoking again. Remember, most people try several times before finally quitting. You may have symptoms of withdrawal because your body is used to nicotine. You may crave cigarettes, be irritable, feel very hungry, cough often, get headaches, or have difficulty concentrating. The withdrawal symptoms are only temporary. They are strongest when you first quit, but they will go away within 10-14 days. To reduce the chances of relapse, try to:  Avoid drinking alcohol. Drinking lowers your chances of successfully quitting.  Reduce the amount of caffeine you consume. Once you quit smoking, the amount of caffeine in your body increases and can give you symptoms, such as a rapid heartbeat, sweating, and anxiety.  Avoid smokers because they can make you want to smoke.  Do not let weight gain distract you. Many smokers will gain weight when they quit, usually less than 10 pounds. Eat a healthy diet and stay active.  You can always lose the weight gained after you quit.  Find ways to improve your mood other than smoking. FOR MORE INFORMATION  www.smokefree.gov  Document Released: 09/12/2001 Document Revised: 03/19/2012 Document Reviewed: 12/28/2011 [facility] Patient Information 2015 [place], [place]. This information is not intended to replace advice given to you by your health care provider. Make sure you discuss any questions you have with your health care provider.

## 2014-04-02 NOTE — Progress Notes (Signed)
   Subjective:    Patient ID: Heather [name], female    DOB: 1971-08-29, 43 y.o.   MRN: [id number]  Cough Associated symptoms include shortness of breath.   43 y/o female with PMH of bacterial vaginitis, gastritis, phlebitis, factor V Leiden without history of thrombosis presents for routine follow up and establishment of care with [facility].  Pt states that she is a current smoker 1 PPD and has smoked for the past 30 years (up to 2 PPD). She complains of chronic cough associated with sputum production. States phlegm is thick and usually white but occasionally blood tinged/rust colored. It is associated with pleuritic chest pain on coughing.  Pt also complains of significant unintentional weight loss of approximately 80 pounds over the past 2 years. She complains of early satiety and is able to take only 2-3 bites of food before feeling full. She also complains of diarrhea or nausea/vomiting immediately after eating. States it happens with most meals.     Review of Systems  Constitutional: Positive for appetite change, fatigue and unexpected weight change.  HENT: Negative.   Respiratory: Positive for cough and shortness of breath.        Occasional wheezing  Cardiovascular: Negative.   Gastrointestinal: Positive for nausea, vomiting and diarrhea. Negative for abdominal pain and abdominal distention.  Endocrine: Negative.   Genitourinary: Negative.   Musculoskeletal: Negative.   Neurological: Negative.        Objective:   Physical Exam  Constitutional: She is oriented to person, place, and time.  Poorly nourished with mild temporal wasting. Appears gaunt  HENT:  Head: Normocephalic and atraumatic.  Mouth/Throat: Oropharynx is clear and moist.  Neck: Normal range of motion. Neck supple.  Cardiovascular: Normal rate, regular rhythm and normal heart sounds.   Pulmonary/Chest: Effort normal and breath sounds normal. She has no wheezes.  Abdominal: Soft. Bowel sounds are normal. There is no  rebound and no guarding.  Mild abd tenderness- diffuse  Musculoskeletal: Normal range of motion.  Neurological: She is alert and oriented to person, place, and time.  Skin: Skin is warm and dry.          Assessment & Plan:  Please see problem based charting for assessment and plan

## 2014-04-02 NOTE — Assessment & Plan Note (Signed)
Patient with nausea/vomiting assoc with early satiety and significant weight loss - will refer to GI - will likely need EGD and possible colonoscopy

## 2014-04-02 NOTE — Assessment & Plan Note (Signed)
-   Given episodes of hemoptysis and weight loss will check CT scan of the chest to rule out malignancy - will likely need PFTs in the future given likelihood of COPD and will consider albuterol inhaler prn if pt with recurrent wheezing but she doesn't feel as if she needs this at this time - Will check CBC but unlikely infectious

## 2014-04-02 NOTE — Assessment & Plan Note (Signed)
-   Will check TSH, A1C - Will need malignancy work up including CT chest, GI referral - Will check HIV, hep panel on next visit if above work up negative

## 2014-04-02 NOTE — Assessment & Plan Note (Signed)
-   patient was scheduled for mammogram for screening - lipid profile ordered

## 2014-04-02 NOTE — Assessment & Plan Note (Signed)
>>  ASSESSMENT AND PLAN FOR COUGH WRITTEN ON 04/02/2014 10:14 AM BY NARENDRA, NISCHAL, MD  - Given episodes of hemoptysis and weight loss will check CT scan of the chest to rule out malignancy - will likely need PFTs in the future given likelihood of COPD and will consider albuterol inhaler prn if pt with recurrent wheezing but she doesn't feel as if she needs this at this time - Will check CBC but unlikely infectious

## 2014-04-06 ENCOUNTER — Telehealth: Payer: Self-pay | Admitting: Internal Medicine

## 2014-04-06 ENCOUNTER — Ambulatory Visit (HOSPITAL_COMMUNITY)
Admission: RE | Admit: 2014-04-06 | Discharge: 2014-04-06 | Disposition: A | Discharge status: Home / Self Care | Payer: 59 | Source: Ambulatory Visit | Attending: Internal Medicine | Admitting: Internal Medicine

## 2014-04-06 DIAGNOSIS — R059 Cough, unspecified: Secondary | ICD-10-CM | POA: Insufficient documentation

## 2014-04-06 DIAGNOSIS — R634 Abnormal weight loss: Secondary | ICD-10-CM | POA: Insufficient documentation

## 2014-04-06 DIAGNOSIS — R05 Cough: Secondary | ICD-10-CM | POA: Insufficient documentation

## 2014-04-06 NOTE — Telephone Encounter (Signed)
CT chest results noted and discussed with patient. She is scheduled for a mammogram on Thursday and will see GI in september

## 2014-04-09 ENCOUNTER — Ambulatory Visit
Admission: RE | Admit: 2014-04-09 | Discharge: 2014-04-09 | Disposition: A | Discharge status: Home / Self Care | Payer: 59 | Source: Ambulatory Visit | Attending: Internal Medicine | Admitting: Internal Medicine

## 2014-04-09 ENCOUNTER — Ambulatory Visit: Payer: 59 | Admitting: Internal Medicine

## 2014-04-09 DIAGNOSIS — Z0000 Encounter for general adult medical examination without abnormal findings: Secondary | ICD-10-CM

## 2014-04-16 ENCOUNTER — Encounter: Payer: Self-pay | Admitting: Internal Medicine

## 2014-04-16 ENCOUNTER — Ambulatory Visit (INDEPENDENT_AMBULATORY_CARE_PROVIDER_SITE_OTHER): Payer: 59 | Admitting: Internal Medicine

## 2014-04-16 VITALS — BP 96/73 | HR 85 | Temp 97.8°F | Ht 63.4 in | Wt 104.1 lb

## 2014-04-16 DIAGNOSIS — Z0000 Encounter for general adult medical examination without abnormal findings: Secondary | ICD-10-CM

## 2014-04-16 DIAGNOSIS — K921 Melena: Secondary | ICD-10-CM

## 2014-04-16 DIAGNOSIS — R112 Nausea with vomiting, unspecified: Secondary | ICD-10-CM

## 2014-04-16 DIAGNOSIS — R310 Gross hematuria: Secondary | ICD-10-CM

## 2014-04-16 DIAGNOSIS — R059 Cough, unspecified: Secondary | ICD-10-CM

## 2014-04-16 DIAGNOSIS — R05 Cough: Secondary | ICD-10-CM

## 2014-04-16 DIAGNOSIS — F17200 Nicotine dependence, unspecified, uncomplicated: Secondary | ICD-10-CM

## 2014-04-16 NOTE — Patient Instructions (Addendum)
We have rescheduled your appointment with gastroenterology for July 28th We will follow up the results of your urine analysis and abdominal sonogram Please follow up with your gynecologist for routine screening Take ensure 3 times a day to supplement your nutrition Please follow up with [place] in 2-3 weeks

## 2014-04-16 NOTE — Assessment & Plan Note (Signed)
-   Pt still with recurrent nausea and vomiting. States it occurs after she eats too much - Has early satiety assoc with weight loss as well - pt to follow up with GI on 7/28. Will likely need EGD

## 2014-04-16 NOTE — Assessment & Plan Note (Signed)
Patient still smoking but motivated to quit Now smoking less than 1 PPD. Counseled on importance of quitting

## 2014-04-16 NOTE — Assessment & Plan Note (Addendum)
Patient with intermittent hematochezia To follow up with GI - possible hemorrhoidal but given weight loss will likely need colonoscopy to rule out malignancy Will follow up GI recommendations Hg stable from 2 weeks ago. Will recheck on next appointment.

## 2014-04-16 NOTE — Progress Notes (Signed)
   Subjective:    Patient ID: Heather [name], female    DOB: November 15, 1970, 43 y.o.   MRN: [id number]  HPI 43 y/o female wuth PMH of phlebitis, Factor V leiden without history of thrombosis presents for follow up for test results. Patient states she still is eating poorly and complains of early satiety. She also complains of intermittent hematuria occasionally bright red and also complains of intermittent hematochezia. She had recent bloodwork done 2 weeks ago which showed normal Hg.  No CP, no sob, no abd pain, no diarrhea/constipation, no fevers   Review of Systems  Constitutional: Positive for appetite change, fatigue and unexpected weight change. Negative for fever, chills and diaphoresis.  HENT: Negative.   Eyes: Negative.   Respiratory: Negative.   Cardiovascular: Negative.   Gastrointestinal: Positive for vomiting and blood in stool. Negative for nausea, diarrhea and rectal pain.  Genitourinary: Positive for frequency and hematuria. Negative for dysuria, flank pain and difficulty urinating.  Musculoskeletal: Negative.   Neurological: Negative.   Psychiatric/Behavioral: Negative.        Objective:   Physical Exam  Constitutional: She is oriented to person, place, and time.  Poorly nourished with mild temporal wasting  HENT:  Head: Normocephalic and atraumatic.  Neck: Normal range of motion. Neck supple.  Cardiovascular: Normal rate, regular rhythm and normal heart sounds.   Pulmonary/Chest: Effort normal and breath sounds normal. She has no wheezes. She has no rales.  Abdominal: Soft. There is tenderness. There is no rebound and no guarding.  Mild diffuse abd tenderness worse in suprapubic and lower quadrants  Musculoskeletal: Normal range of motion. She exhibits no edema and no tenderness.  Neurological: She is alert and oriented to person, place, and time.  Skin: Skin is warm and dry.  Psychiatric: She has a normal mood and affect. Her behavior is normal.            Assessment & Plan:  Please see problem based charting for assessment and plan

## 2014-04-16 NOTE — Assessment & Plan Note (Signed)
CT scan negative for evidence of malignancy Pt with mild emphysematous changes on CT. Will need PFTs  once acute issues resolve

## 2014-04-16 NOTE — Assessment & Plan Note (Signed)
>>  ASSESSMENT AND PLAN FOR COUGH WRITTEN ON 04/16/2014  9:56 AM BY NARENDRA, NISCHAL, MD  CT scan negative for evidence of malignancy Pt with mild emphysematous changes on CT. Will need PFTs  once acute issues resolve

## 2014-04-16 NOTE — Assessment & Plan Note (Signed)
-   will check urine analysis today - given intermittent hematuria will get abd sono to ascertain etiology especially in the light of significant weight loss and possibility of malignancy

## 2014-04-17 ENCOUNTER — Telehealth: Payer: Self-pay | Admitting: Internal Medicine

## 2014-04-17 ENCOUNTER — Ambulatory Visit (HOSPITAL_COMMUNITY)
Admission: RE | Admit: 2014-04-17 | Discharge: 2014-04-17 | Disposition: A | Discharge status: Home / Self Care | Payer: 59 | Source: Ambulatory Visit | Attending: Internal Medicine | Admitting: Internal Medicine

## 2014-04-17 DIAGNOSIS — R310 Gross hematuria: Secondary | ICD-10-CM

## 2014-04-17 LAB — URINALYSIS, COMPLETE
BACTERIA UA: NONE SEEN
Bilirubin Urine: NEGATIVE
CASTS: NONE SEEN
Crystals: NONE SEEN
Glucose, UA: NEGATIVE mg/dL
HGB URINE DIPSTICK: NEGATIVE
KETONES UR: NEGATIVE mg/dL
LEUKOCYTES UA: NEGATIVE
NITRITE: NEGATIVE
PH: 6.5 (ref 5.0–8.0)
PROTEIN: NEGATIVE mg/dL
Specific Gravity, Urine: 1.005 (ref 1.005–1.030)
Urobilinogen, UA: 0.2 mg/dL (ref 0.0–1.0)

## 2014-04-17 NOTE — Telephone Encounter (Signed)
I called patient with results of the abdominal sonogram as well as her urine analysis. No further work up required at this time. Patient to follow up with GI on July 28th and then follow up with [place] after that for an appointment. She expresses understanding of the plan

## 2014-04-28 ENCOUNTER — Other Ambulatory Visit: Payer: Self-pay | Admitting: *Deleted

## 2014-04-28 ENCOUNTER — Encounter: Payer: Self-pay | Admitting: Gastroenterology

## 2014-04-28 ENCOUNTER — Ambulatory Visit (INDEPENDENT_AMBULATORY_CARE_PROVIDER_SITE_OTHER): Payer: 59 | Admitting: Gastroenterology

## 2014-04-28 VITALS — BP 120/62 | HR 86 | Ht 63.0 in | Wt 103.0 lb

## 2014-04-28 DIAGNOSIS — R634 Abnormal weight loss: Secondary | ICD-10-CM

## 2014-04-28 DIAGNOSIS — R110 Nausea: Secondary | ICD-10-CM

## 2014-04-28 DIAGNOSIS — K625 Hemorrhage of anus and rectum: Secondary | ICD-10-CM

## 2014-04-28 DIAGNOSIS — R6881 Early satiety: Secondary | ICD-10-CM

## 2014-04-28 MED ORDER — MOVIPREP 100 G PO SOLR
1.0000 | ORAL | Status: DC
Start: 2014-04-28 — End: 2014-05-14

## 2014-04-28 MED ORDER — ONDANSETRON 4 MG PO TBDP: 4.0000 mg | ORAL_TABLET | Freq: Three times a day (TID) | ORAL | Status: DC | PRN

## 2014-04-28 NOTE — Patient Instructions (Signed)
You have been scheduled for an endoscopy and colonoscopy. Please follow the written instructions given to you at your visit today. Please pick up your prep at the pharmacy within the next 1-3 days. [facility]. We have given you a free coupon to use.  We also sent prescription for nausea medication, Zofran 4 mg, Dissolve one tablet on the tongue every 8 hours for nausea. If you use inhalers (even only as needed), please bring them with you on the day of your procedure. Your physician has requested that you go to www.startemmi.com and enter the access code given to you at your visit today. This web site gives a general overview about your procedure. However, you should still follow specific instructions given to you by our office regarding your preparation for the procedure.

## 2014-04-28 NOTE — Addendum Note (Signed)
Addended byTonette Bihari on: 04/28/2014 04:30 PM   Modules accepted: Orders

## 2014-04-28 NOTE — Progress Notes (Signed)
Reviewed and agree with CTscan to evaluate weight loss. Needs to check albumin,prealbumin, TSH,

## 2014-04-28 NOTE — Progress Notes (Signed)
04/28/2014 Heather [name] [id number] 02/17/1971   HISTORY OF PRESENT ILLNESS:  This is a pleasant 43 year old female who is new to our practice and is here today with several issues and complaints. She reports that the biggest issues have been weight loss and nausea/fullness. She reports that over the last couple of years she has lost 80 pounds, 40 pounds being more recently over the last several months. This weight loss has been unintentional.  She also reports constant nausea that has been present for several months. She states that she feels very full after eating just a couple of bites of food.  She says that she would feel like she is starving, but after taking just a couple of bites of food she cannot eat any more. She constantly feels bloated. If she tries to eat too much at a time then she will end up vomiting. She denies any heartburn/reflux/indigestion type symptoms. She denies any dysphagia. She also reports that she has seen blood in her stool on several occasions. This is usually bright red in color but sometimes it rusty colored as well. She reports that she has had a colonoscopy by [facility] approximately 2011 or 2012. We do not have those records but are working on obtaining them currently. Reports that she had some polyps removed and says that she thinks some of them were left in there for some reason.    Past Medical History  Diagnosis Date  . Difficult intubation     pt was told difficult intubation at [facility]  . Blood dyscrasia   . Factor V deficiency     protein deficiency  . Current smoker    Past Surgical History  Procedure Laterality Date  . Abdominal hysterectomy    . Cesarean section      x 2  . Lesion removal  07/07/2011    Procedure: EXCISION VAGINAL LESION;  Surgeon: Elveria Royals;  Location: [facility];  Service: Gynecology;  Laterality: N/A;  . Laparoscopy  07/07/2011    Procedure: LAPAROSCOPY DIAGNOSTIC;  Surgeon: Elveria Royals;  Location: [facility];   Service: Gynecology;  Laterality: N/A;    reports that she has been smoking Cigarettes.  She has a 28 pack-year smoking history. She has never used smokeless tobacco. She reports that she drinks alcohol. She reports that she does not use illicit drugs. family history is not on file. Allergies  Allergen Reactions  . Iohexol Swelling    Updated to actual drug allergy  . Aspirin Nausea And Vomiting      Outpatient Encounter Prescriptions as of 04/28/2014  Medication Sig  . MOVIPREP 100 G SOLR Take 1 kit (200 g total) by mouth as directed.  . ondansetron (ZOFRAN ODT) 4 MG disintegrating tablet Take 1 tablet (4 mg total) by mouth every 8 (eight) hours as needed for nausea or vomiting.     REVIEW OF SYSTEMS  : All other systems reviewed and negative except where noted in the History of Present Illness.   PHYSICAL EXAM: BP 120/62  Pulse 86  Ht 5' 3" (1.6 m)  Wt 103 lb (46.72 kg)  BMI 18.25 kg/m2 General:  Thin white female in no acute distress Head: Normocephalic and atraumatic Eyes:  Sclerae anicteric, conjunctiva pink. Ears: Normal auditory acuity  Lungs: Clear throughout to auscultation Heart: Regular rate and rhythm Abdomen: Soft, non-distended.  Normal bowel sounds.  Non-tender. Musculoskeletal: Symmetrical with no gross deformities  Skin: No lesions on visible extremities Extremities: No  edema  Neurological: Alert oriented x 4, grossly non-focal Psychological:  Alert and cooperative. Normal mood and affect  ASSESSMENT AND PLAN: -Weight loss, unintentional: 80 pounds over a few years but 40 pounds most recently.  Rule out malignancy. -Nausea, constant and early satiety:  Rule out GERD/esophagitis vs malignancy, etc. -Rectal bleeding -Reported history of colon polyps:  Will get records from colonoscopy with [facility] from 3-4 years ago.  She reports that she had polyps, some of which she says were not removed for some reason.  *Will schedule for EGD and colonoscopy for now  (needs to be at [facility] hospital due to history of difficult intubation).  Once we get records from [place] and review colonoscopy report we could consider cancelling colonoscopy if not needed, but may just want to proceed anyway due to weight los and other complaints.  Will get CT scan of the abdomen and pelvis with contrast as well.  Will give zofran to use prn for now.

## 2014-04-29 ENCOUNTER — Telehealth: Payer: Self-pay | Admitting: *Deleted

## 2014-04-29 ENCOUNTER — Other Ambulatory Visit: Payer: Self-pay | Admitting: *Deleted

## 2014-04-29 MED ORDER — PREDNISONE 50 MG PO TABS: ORAL_TABLET | ORAL | Status: DC

## 2014-04-29 NOTE — Telephone Encounter (Signed)
I explained to [facility] that we felt she should have her EGD/COLON at [facility] due to a history of difficult intubation.  She agreed to the change and the date is 05-15-2014 at [facility] with Dr. Olevia Perches. I am mailing her new instructions today.  Also I advised her Alonza Bogus PA felt she should have a CT Abd & Pelvis with contrast at the hospital also.  We scheduled that for Mon 05-04-2014 at 8 AM.  She is to arrive at [facility] and pick her contrast up at the radiology department at [facility] since she works at [employer].  I explained she will have to be premedicated due to an allergy to the IV contrast.  She will take 1 tab of Prednisone 50 mg a 7:00 PM  the night before,  Take 1 tab Prednisone 50 mg at 1:00Am and take 1 tab Prednisone 50 mg and 1 Benedryl at 7:00am the day of the Ct scan.  She read the instructions back to me over the phone and verbalized understanding of the instructions.

## 2014-04-30 ENCOUNTER — Encounter: Payer: Self-pay | Admitting: Internal Medicine

## 2014-04-30 ENCOUNTER — Ambulatory Visit (INDEPENDENT_AMBULATORY_CARE_PROVIDER_SITE_OTHER): Payer: 59 | Admitting: Internal Medicine

## 2014-04-30 VITALS — BP 105/67 | HR 73 | Temp 97.5°F | Ht 63.4 in | Wt 103.0 lb

## 2014-04-30 DIAGNOSIS — R6881 Early satiety: Secondary | ICD-10-CM

## 2014-04-30 DIAGNOSIS — F17200 Nicotine dependence, unspecified, uncomplicated: Secondary | ICD-10-CM

## 2014-04-30 DIAGNOSIS — R634 Abnormal weight loss: Secondary | ICD-10-CM

## 2014-04-30 NOTE — Progress Notes (Signed)
   Subjective:    Patient ID: Heather [name], female    DOB: December 30, 1970, 43 y.o.   MRN: [id number]  HPI Patient seen for follow up today following her GI appointment. Patient with persistent loss of appetite and progressive weight loss. Still with early satiety Of note she has been cutting back on smoking and is now down to 1/2 PPD.      Review of Systems  Constitutional: Positive for appetite change and unexpected weight change. Negative for fever and activity change.  HENT: Negative.   Eyes: Negative.   Respiratory: Negative.   Cardiovascular: Negative.   Gastrointestinal: Negative.   Genitourinary: Negative.   Musculoskeletal: Negative.   Skin: Negative.   Neurological: Negative.   Psychiatric/Behavioral: Negative.        Objective:   Physical Exam  Constitutional: She is oriented to person, place, and time.  Poorly nourished  HENT:  Head: Normocephalic and atraumatic.  Neck: Normal range of motion.  Cardiovascular: Normal rate, regular rhythm and normal heart sounds.   Pulmonary/Chest: Effort normal and breath sounds normal.  Abdominal: Soft. Bowel sounds are normal. There is tenderness.  Mild right sided abdominal tenderness  Musculoskeletal: Normal range of motion. She exhibits no edema and no tenderness.  Neurological: She is alert and oriented to person, place, and time.  Skin: Skin is warm and dry.  Psychiatric: She has a normal mood and affect. Her behavior is normal.          Assessment & Plan:  Patient with persistent early satiety and progressive weight loss  - GI follow up noted. Patient for EGD/colonoscopy on 05/15/14 - Patient for CT abd/pelvis with contrast on August 3rd. - Will get pre - medicated with prednisone prior to contrast - Will follow up results  Patient also to follow up with Gyn for routine health maintenance

## 2014-04-30 NOTE — Assessment & Plan Note (Signed)
  Assessment: Progress toward smoking cessation:   good Barriers to progress toward smoking cessation:   anxiety about weight loss Comments: She is progressing well and is down to 1/2 PPD  Plan: Instruction/counseling given:  I counseled patient on the dangers of tobacco use, advised patient to stop smoking, and reviewed strategies to maximize success. Educational resources provided:  [employer] account [employer]) brochure Self management tools provided:    Medications to assist with smoking cessation:  None Patient agreed to the following self-care plans for smoking cessation:   patient wants to attempt quitting on her own and is progressing towards goal Other plans: She is speaking to her friend who quit and is cutting back and will hopefully stop completely

## 2014-04-30 NOTE — Patient Instructions (Signed)
You are doing a great job on cutting down on smoking. Keep up the good work! Please follow up with the gastroenterologist for your endoscopy and colonoscopy We will follow up with them and obtain your results. Please follow up with me in September

## 2014-05-01 ENCOUNTER — Ambulatory Visit (HOSPITAL_COMMUNITY): Payer: 59

## 2014-05-04 ENCOUNTER — Ambulatory Visit (HOSPITAL_COMMUNITY)
Admission: RE | Admit: 2014-05-04 | Discharge: 2014-05-04 | Disposition: A | Discharge status: Home / Self Care | Payer: 59 | Source: Ambulatory Visit | Attending: Gastroenterology | Admitting: Gastroenterology

## 2014-05-04 DIAGNOSIS — R634 Abnormal weight loss: Secondary | ICD-10-CM | POA: Insufficient documentation

## 2014-05-04 DIAGNOSIS — R6881 Early satiety: Secondary | ICD-10-CM | POA: Insufficient documentation

## 2014-05-04 DIAGNOSIS — R110 Nausea: Secondary | ICD-10-CM | POA: Insufficient documentation

## 2014-05-04 DIAGNOSIS — K625 Hemorrhage of anus and rectum: Secondary | ICD-10-CM | POA: Insufficient documentation

## 2014-05-04 MED ORDER — IOHEXOL 300 MG/ML  SOLN
80.0000 mL | Freq: Once | INTRAMUSCULAR | Status: AC | PRN
  Administered 2014-05-04: 80 mL via INTRAVENOUS

## 2014-05-05 ENCOUNTER — Telehealth: Payer: Self-pay | Admitting: *Deleted

## 2014-05-05 ENCOUNTER — Encounter: Payer: Self-pay | Admitting: Gastroenterology

## 2014-05-05 NOTE — Telephone Encounter (Signed)
Patient notified of results and recommendations.

## 2014-05-05 NOTE — Telephone Encounter (Signed)
Message copied by Hulan Saas on Tue May 05, 2014 10:52 AM ------      Message from: Alonza Bogus D      Created: Mon May 04, 2014  5:29 PM       Please let patient know that CT scan of the abdomen and pelvis did not show anything of concern including masses/tumors/cancers.  Await EGD and colonoscopy, which are already scheduled.            Thank you,            Jess ------

## 2014-05-07 ENCOUNTER — Encounter: Payer: Self-pay | Admitting: Internal Medicine

## 2014-05-07 ENCOUNTER — Ambulatory Visit (INDEPENDENT_AMBULATORY_CARE_PROVIDER_SITE_OTHER): Payer: 59 | Admitting: Internal Medicine

## 2014-05-07 VITALS — BP 109/67 | HR 98 | Temp 97.0°F | Ht 63.4 in | Wt 105.3 lb

## 2014-05-07 DIAGNOSIS — L500 Allergic urticaria: Secondary | ICD-10-CM

## 2014-05-07 MED ORDER — FEXOFENADINE-PSEUDOEPHED ER 60-120 MG PO TB12: 1.0000 | ORAL_TABLET | Freq: Two times a day (BID) | ORAL | Status: DC

## 2014-05-07 MED ORDER — METHYLPREDNISOLONE (PAK) 4 MG PO TABS: 4.0000 mg | ORAL_TABLET | Freq: Every day | ORAL | Status: DC

## 2014-05-07 MED ORDER — RANITIDINE HCL 150 MG PO TABS: 150.0000 mg | ORAL_TABLET | Freq: Two times a day (BID) | ORAL | Status: DC

## 2014-05-07 NOTE — Patient Instructions (Signed)
- You likely have an allergic reaction to the IV contrast.  - I have prescribed Allegra twice a day. You can take that along with the benadryl as needed - I have also given you a course of steroids for now - If the rash gets worse or you get wheezing/shortness of breath/lightheadedness please go to the ER - I have also prescribed ranitidine for you to take while you are on the steroids   Anaphylactic Reaction An anaphylactic reaction is a sudden, severe allergic reaction that involves the whole body. It can be life threatening. A hospital stay is often required. People with asthma, eczema, or hay fever are slightly more likely to have an anaphylactic reaction. CAUSES  An anaphylactic reaction may be caused by anything to which you are allergic. After being exposed to the allergic substance, your immune system becomes sensitized to it. When you are exposed to that allergic substance again, an allergic reaction can occur. Common causes of an anaphylactic reaction include:  Medicines.  Foods, especially peanuts, wheat, shellfish, milk, and eggs.  Insect bites or stings.  Blood products.  Chemicals, such as dyes, latex, and contrast material used for imaging tests. SYMPTOMS  When an allergic reaction occurs, the body releases histamine and other substances. These substances cause symptoms such as tightening of the airway. Symptoms often develop within seconds or minutes of exposure. Symptoms may include:  Skin rash or hives.  Itching.  Chest tightness.  Swelling of the eyes, tongue, or lips.  Trouble breathing or swallowing.  Lightheadedness or fainting.  Anxiety or confusion.  Stomach pains, vomiting, or diarrhea.  Nasal congestion.  A fast or irregular heartbeat (palpitations). DIAGNOSIS  Diagnosis is based on your history of recent exposure to allergic substances, your symptoms, and a physical exam. Your caregiver may also perform blood or urine tests to confirm the  diagnosis. TREATMENT  Epinephrine medicine is the main treatment for an anaphylactic reaction. Other medicines that may be used for treatment include antihistamines, steroids, and albuterol. In severe cases, fluids and medicine to support blood pressure may be given through an intravenous line (IV). Even if you improve after treatment, you need to be observed to make sure your condition does not get worse. This may require a stay in the hospital. [place] a medical alert bracelet or necklace stating your allergy.  You and your family must learn how to use an anaphylaxis kit or give an epinephrine injection to temporarily treat an emergency allergic reaction. Always carry your epinephrine injection or anaphylaxis kit with you. This can be lifesaving if you have a severe reaction.  Do not drive or perform tasks after treatment until the medicines used to treat your reaction have worn off, or until your caregiver says it is okay.  If you have hives or a rash:  Take medicines as directed by your caregiver.  You may use an over-the-counter antihistamine (diphenhydramine) as needed.  Apply cold compresses to the skin or take baths in cool water. Avoid hot baths or showers. SEEK MEDICAL CARE IF:   You develop symptoms of an allergic reaction to a new substance. Symptoms may start right away or minutes later.  You develop a rash, hives, or itching.  You develop new symptoms. SEEK IMMEDIATE MEDICAL CARE IF:   You have swelling of the mouth, difficulty breathing, or wheezing.  You have a tight feeling in your chest or throat.  You develop hives, swelling, or itching all over your body.  You develop severe vomiting or diarrhea.  You feel faint or pass out. This is an emergency. Use your epinephrine injection or anaphylaxis kit as you have been instructed. Call your local emergency services (911 in [place] Even if you improve after the injection, you need to be examined at  a hospital emergency department. MAKE SURE YOU:   Understand these instructions.  Will watch your condition.  Will get help right away if you are not doing well or get worse. Document Released: 09/18/2005 Document Revised: 09/23/2013 Document Reviewed: 12/20/2011 [facility] Patient Information 2015 [place], [place]. This information is not intended to replace advice given to you by your health care provider. Make sure you discuss any questions you have with your health care provider.

## 2014-05-07 NOTE — Progress Notes (Signed)
   Subjective:    Patient ID: Heather [name], female    DOB: [birth year], 43 y.o.   MRN: [id number]  Rash   Patient seen for an acute visit. Patient had a CT abd/pelvis done on Monday (ordered by GI) and since then has been developing a rash . It started over her abdomen but is now slowly spreading to her chest and thighs and upper arms. She states there is no itching but complains of burning. No SOB/wheezing/hives/syncope.     Review of Systems  Constitutional: Positive for unexpected weight change. Negative for chills, diaphoresis, activity change and appetite change.  HENT: Negative.   Eyes: Negative.   Respiratory: Negative.   Cardiovascular: Negative.   Gastrointestinal: Negative.   Endocrine: Negative.   Genitourinary: Negative.   Musculoskeletal: Negative.   Skin: Positive for rash.  Neurological: Positive for light-headedness.  Psychiatric/Behavioral: Negative.        Objective:   Physical Exam  Constitutional: She is oriented to person, place, and time.  Poorly nourished  HENT:  Head: Normocephalic and atraumatic.  Neck: Normal range of motion.  Cardiovascular: Normal rate, regular rhythm and normal heart sounds.   Pulmonary/Chest: Effort normal and breath sounds normal. She has no wheezes. She has no rales.  Abdominal: Soft. Bowel sounds are normal. There is no tenderness.  Erythematous diffuse rash present over abdomen and thighs and upper arms  Musculoskeletal: Normal range of motion.  Neurological: She is alert and oriented to person, place, and time.  Skin: Rash noted.  Erythematous, diffuse,  Psychiatric: She has a normal mood and affect. Her behavior is normal.          Assessment & Plan:  1. Rash- Likely allergic secondary to IV contrast.  Will start medrol dose pack and ranitidine.  Continue with benadryl prn and allegra twice a day Patient to follow up in 1 week She is instructed to go the ER if wheezing/worsening rash/SOB, angioedema,  lightheadedness/syncope

## 2014-05-13 ENCOUNTER — Ambulatory Visit: Payer: 59 | Admitting: Internal Medicine

## 2014-05-14 ENCOUNTER — Ambulatory Visit (INDEPENDENT_AMBULATORY_CARE_PROVIDER_SITE_OTHER): Payer: 59 | Admitting: Internal Medicine

## 2014-05-14 ENCOUNTER — Encounter: Payer: Self-pay | Admitting: Internal Medicine

## 2014-05-14 VITALS — BP 100/71 | HR 73 | Temp 98.0°F | Ht 63.25 in | Wt 101.5 lb

## 2014-05-14 DIAGNOSIS — Z872 Personal history of diseases of the skin and subcutaneous tissue: Secondary | ICD-10-CM

## 2014-05-14 DIAGNOSIS — R634 Abnormal weight loss: Secondary | ICD-10-CM

## 2014-05-14 HISTORY — DX: Personal history of diseases of the skin and subcutaneous tissue: Z87.2

## 2014-05-14 NOTE — Assessment & Plan Note (Signed)
Pt reports rash she developed likely due to CT contrast has resolved.  She developed a rash last Tuesday after receiving contrast on Monday for a CT.  She has experienced this once before, but was premedicated with prednisone and benadryl.  She came to clinic on Thursday after her rash became worse and completed a course of prednisone and feels much better--rash is almost gone.   -would avoid contrast in the future unless absolutely necessary  -follow-up with PCP as needed

## 2014-05-14 NOTE — Patient Instructions (Signed)
Thank you for your visit today.   Please return to the internal medicine clinic as needed.   You need the following test(s) for regular health maintenance: pap smear, tetanus.   Please be sure to bring all of your medications with you to every visit; this includes herbal supplements, vitamins, eye drops, and any over-the-counter medications.   Should you have any questions regarding your medications and/or any new or worsening symptoms, please be sure to call the clinic at [phone].   If you believe that you are suffering from a life threatening condition or one that may result in the loss of limb or function, then you should call 911 or proceed to the nearest Emergency Department.     A healthy lifestyle and preventative care can promote health and wellness.   Maintain regular health, dental, and eye exams.  Eat a healthy diet. Foods like vegetables, fruits, whole grains, low-fat dairy products, and lean protein foods contain the nutrients you need without too many calories. Decrease your intake of foods high in solid fats, added sugars, and salt. Get information about a proper diet from your caregiver, if necessary.  Regular physical exercise is one of the most important things you can do for your health. Most adults should get at least 150 minutes of moderate-intensity exercise (any activity that increases your heart rate and causes you to sweat) each week. In addition, most adults need muscle-strengthening exercises on 2 or more days a week.   Maintain a healthy weight. The body mass index (BMI) is a screening tool to identify possible weight problems. It provides an estimate of body fat based on height and weight. Your caregiver can help determine your BMI, and can help you achieve or maintain a healthy weight. For adults 20 years and older:  A BMI below 18.5 is considered underweight.  A BMI of 18.5 to 24.9 is normal.  A BMI of 25 to 29.9 is considered overweight.  A BMI  of 30 and above is considered obese.

## 2014-05-14 NOTE — Progress Notes (Signed)
Patient ID: Heather [name], female   DOB: 08/17/1971, 43 y.o.   MRN: [id number]    Subjective:   Patient ID: Heather [name] female    DOB: 1971/06/30 43 y.o.    MRN: [id number] Health Maintenance Due: Health Maintenance Due  Topic Date Due  . Pap Smear  11/29/1987  . Tetanus/tdap  11/28/1988  . Influenza Vaccine  05/02/2014    _________________________________________________  HPI: [name] is a 43 y.o. female here for an acute visit.  Pt has a PMH outlined below.  Please see problem-based charting assessment and plan note for further details of medical issues addressed at today's visit.  PMH: Past Medical History  Diagnosis Date  . Difficult intubation     pt was told difficult intubation at [facility]  . Blood dyscrasia   . Factor V deficiency     protein deficiency  . Current smoker     Medications: Current Outpatient Prescriptions on File Prior to Visit  Medication Sig Dispense Refill  . fexofenadine-pseudoephedrine (ALLEGRA-D) 60-120 MG per tablet Take 1 tablet by mouth 2 (two) times daily.  30 tablet  0  . methylPREDNIsolone (MEDROL DOSPACK) 4 MG tablet Take 1 tablet (4 mg total) by mouth daily. follow package directions  12 tablet  0  . MOVIPREP 100 G SOLR Take 1 kit (200 g total) by mouth as directed.  1 kit  0  . ondansetron (ZOFRAN ODT) 4 MG disintegrating tablet Take 1 tablet (4 mg total) by mouth every 8 (eight) hours as needed for nausea or vomiting.  30 tablet  0  . ranitidine (ZANTAC) 150 MG tablet Take 1 tablet (150 mg total) by mouth 2 (two) times daily.  60 tablet  0   No current facility-administered medications on file prior to visit.    Allergies: Allergies  Allergen Reactions  . Iohexol Swelling    Updated to actual drug allergy  . Aspirin Nausea And Vomiting    FH: No family history on file.  SH: History   Social History  . Marital Status: Divorced    Spouse Name: N/A    Number of Children: N/A  . Years of Education:  N/A   Social History Main Topics  . Smoking status: Current Every Day Smoker -- 0.50 packs/day for 28 years    Types: Cigarettes  . Smokeless tobacco: Never Used     Comment: Cannot afford Chantex.  Cutting back  . Alcohol Use: Yes     Comment: socially once a month - mixed drinks  . Drug Use: No  . Sexual Activity: Yes     Comment:  OV:[id number]   Other Topics Concern  . None   Social History Narrative  . None    Review of Systems: Constitutional: Negative for fever, chills and weight loss.  Eyes: Negative for blurred vision.  Respiratory: Negative for cough and shortness of breath.  Cardiovascular: Negative for chest pain, palpitations and leg swelling.  Gastrointestinal: Negative for nausea, vomiting, abdominal pain, diarrhea, constipation and blood in stool.  Genitourinary: Negative for dysuria, urgency and frequency.  Musculoskeletal: Negative for myalgias and back pain.  Neurological: Negative for dizziness, weakness and headaches.     Objective:   Vital Signs: Filed Vitals:   05/14/14 0932  BP: 100/71  Pulse: 73  Temp: 98 F (36.7 C)  TempSrc: Oral  Height: 5' 3.25" (1.607 m)  Weight: 101 lb 8 oz (46.04 kg)  SpO2: 100%      BP Readings from  Last 3 Encounters:  05/14/14 100/71  05/07/14 109/67  04/30/14 105/67    Physical Exam: Constitutional: Vital signs reviewed.  Patient is well-developed and well-nourished in NAD and cooperative with exam.  Head: Normocephalic and atraumatic. Eyes: PERRL, EOMI, conjunctivae nl, no scleral icterus.  Neck: Supple. Cardiovascular: RRR, no MRG. Pulmonary/Chest: normal effort, non-tender to palpation, CTAB, no wheezes, rales, or rhonchi. Abdominal: Soft. NT/ND +BS. Neurological: A&O x3, cranial nerves II-XII are grossly intact, moving all extremities. Extremities: 2+DP b/l; no pitting edema. Skin: Warm, dry and intact. No rash.  Most Recent Laboratory Results:  CMP     Component Value Date/Time   NA 139  04/02/2014 0932   K 4.2 04/02/2014 0932   CL 104 04/02/2014 0932   CO2 28 04/02/2014 0932   GLUCOSE 99 04/02/2014 0932   BUN 9 04/02/2014 0932   CREATININE 0.71 04/02/2014 0932   CREATININE 0.65 11/08/2012 1020   CALCIUM 9.7 04/02/2014 0932   PROT 7.2 04/02/2014 0932   ALBUMIN 4.9 04/02/2014 0932   AST 15 04/02/2014 0932   ALT 11 04/02/2014 0932   ALKPHOS 47 04/02/2014 0932   BILITOT 0.5 04/02/2014 0932   GFRNONAA >89 04/02/2014 0932   GFRNONAA >90 11/08/2012 1020   GFRAA >89 04/02/2014 0932   GFRAA >90 11/08/2012 1020    CBC    Component Value Date/Time   WBC 6.2 04/02/2014 0932   WBC 5.8 02/01/2011 1404   RBC 4.48 04/02/2014 0932   RBC 4.09 02/01/2011 1404   HGB 13.8 04/02/2014 0932   HGB 12.7 02/01/2011 1404   HCT 39.4 04/02/2014 0932   HCT 37.2 02/01/2011 1404   PLT 134* 04/02/2014 0932   PLT 133* 02/01/2011 1404   MCV 87.9 04/02/2014 0932   MCV 91 02/01/2011 1404   MCH 30.8 04/02/2014 0932   MCH 31.1 02/01/2011 1404   MCHC 35.0 04/02/2014 0932   MCHC 34.1 02/01/2011 1404   RDW 14.2 04/02/2014 0932   RDW 13.2 02/01/2011 1404   LYMPHSABS 1.2 04/02/2014 0932   LYMPHSABS 1.7 02/01/2011 1404   MONOABS 0.5 04/02/2014 0932   EOSABS 0.1 04/02/2014 0932   EOSABS 0.1 02/01/2011 1404   BASOSABS 0.1 04/02/2014 0932   BASOSABS 0.0 02/01/2011 1404    Lipid Panel Lab Results  Component Value Date   CHOL 184 04/02/2014   HDL 67 04/02/2014   LDLCALC 107* 04/02/2014   TRIG 50 04/02/2014   CHOLHDL 2.7 04/02/2014    HA1C Lab Results  Component Value Date   HGBA1C 5.7* 04/02/2014    Urinalysis    Component Value Date/Time   COLORURINE YELLOW 04/16/2014 0947   APPEARANCEUR CLEAR 04/16/2014 0947   LABSPEC 1.005 04/16/2014 0947   PHURINE 6.5 04/16/2014 0947   GLUCOSEU NEG 04/16/2014 0947   HGBUR NEG 04/16/2014 0947   BILIRUBINUR NEG 04/16/2014 0947   KETONESUR NEG 04/16/2014 0947   PROTEINUR NEG 04/16/2014 0947   UROBILINOGEN 0.2 04/16/2014 0947   NITRITE NEG 04/16/2014 0947   LEUKOCYTESUR NEG 04/16/2014 0947    Urine Microalbumin No results found for this  basename: MICROALBUR,  [id number]    Imaging N/A   Assessment & Plan:   Assessment and plan was discussed and formulated with my attending.

## 2014-05-14 NOTE — Assessment & Plan Note (Addendum)
Pt with unintentional weight loss (80 lbs over past 2 years) experiencing diarrhea.  Drinks carnation instant but is unable to tolerate ensure.  Is very concerned about her appearance and being too thin.  Denies any h/o an eating disorder.  No known malignancy.  States she did have a hysterectomy years ago for "precancerous/"  Has not had a pap smear in a couple years and reports having a h/o abnormal pap smears.  NO known h/o depression/anxiety.     -would check HIV -referral to [facility] for medical nutrition therapy to help with weight gain -pt needs pap smear which she is working on -pt scheduled to have colonoscopy  -encouraged pt to stop smoking which she is working on (could consider pharmacotherapy in this pt) -follow up with PCP

## 2014-05-15 ENCOUNTER — Ambulatory Visit (HOSPITAL_COMMUNITY)
Admission: RE | Admit: 2014-05-15 | Discharge: 2014-05-15 | Disposition: A | Discharge status: Home / Self Care | Payer: 59 | Source: Ambulatory Visit | Attending: Internal Medicine | Admitting: Internal Medicine

## 2014-05-15 ENCOUNTER — Encounter (HOSPITAL_COMMUNITY): Payer: Self-pay | Admitting: *Deleted

## 2014-05-15 ENCOUNTER — Encounter (HOSPITAL_COMMUNITY)
Admission: RE | Disposition: A | Discharge status: Home / Self Care | Payer: Self-pay | Source: Ambulatory Visit | Attending: Internal Medicine

## 2014-05-15 DIAGNOSIS — Z860100 Personal history of colon polyps, unspecified: Secondary | ICD-10-CM | POA: Insufficient documentation

## 2014-05-15 DIAGNOSIS — K2960 Other gastritis without bleeding: Secondary | ICD-10-CM

## 2014-05-15 DIAGNOSIS — D126 Benign neoplasm of colon, unspecified: Secondary | ICD-10-CM | POA: Insufficient documentation

## 2014-05-15 DIAGNOSIS — K5730 Diverticulosis of large intestine without perforation or abscess without bleeding: Secondary | ICD-10-CM | POA: Diagnosis not present

## 2014-05-15 DIAGNOSIS — K6389 Other specified diseases of intestine: Secondary | ICD-10-CM | POA: Insufficient documentation

## 2014-05-15 DIAGNOSIS — F17200 Nicotine dependence, unspecified, uncomplicated: Secondary | ICD-10-CM | POA: Insufficient documentation

## 2014-05-15 DIAGNOSIS — K2980 Duodenitis without bleeding: Secondary | ICD-10-CM | POA: Diagnosis not present

## 2014-05-15 DIAGNOSIS — Z886 Allergy status to analgesic agent status: Secondary | ICD-10-CM | POA: Diagnosis not present

## 2014-05-15 DIAGNOSIS — A048 Other specified bacterial intestinal infections: Secondary | ICD-10-CM | POA: Insufficient documentation

## 2014-05-15 DIAGNOSIS — K625 Hemorrhage of anus and rectum: Secondary | ICD-10-CM | POA: Diagnosis not present

## 2014-05-15 DIAGNOSIS — R6881 Early satiety: Secondary | ICD-10-CM | POA: Insufficient documentation

## 2014-05-15 DIAGNOSIS — R634 Abnormal weight loss: Secondary | ICD-10-CM | POA: Diagnosis present

## 2014-05-15 DIAGNOSIS — R112 Nausea with vomiting, unspecified: Secondary | ICD-10-CM | POA: Diagnosis not present

## 2014-05-15 DIAGNOSIS — Z888 Allergy status to other drugs, medicaments and biological substances status: Secondary | ICD-10-CM | POA: Insufficient documentation

## 2014-05-15 DIAGNOSIS — K2940 Chronic atrophic gastritis without bleeding: Secondary | ICD-10-CM | POA: Diagnosis not present

## 2014-05-15 DIAGNOSIS — R110 Nausea: Secondary | ICD-10-CM

## 2014-05-15 DIAGNOSIS — D682 Hereditary deficiency of other clotting factors: Secondary | ICD-10-CM | POA: Diagnosis not present

## 2014-05-15 DIAGNOSIS — K921 Melena: Secondary | ICD-10-CM

## 2014-05-15 HISTORY — DX: Tachycardia, unspecified: R00.0

## 2014-05-15 HISTORY — PX: ESOPHAGOGASTRODUODENOSCOPY: SHX5428

## 2014-05-15 HISTORY — DX: Chronic obstructive pulmonary disease, unspecified: J44.9

## 2014-05-15 HISTORY — PX: COLONOSCOPY: SHX5424

## 2014-05-15 SURGERY — COLONOSCOPY
Anesthesia: Moderate Sedation

## 2014-05-15 MED ORDER — MIRTAZAPINE 15 MG PO TABS: 15.0000 mg | ORAL_TABLET | Freq: Every day | ORAL | Status: DC

## 2014-05-15 MED ORDER — SODIUM CHLORIDE 0.9 % IV SOLN: INTRAVENOUS | Status: DC

## 2014-05-15 MED ORDER — MIDAZOLAM HCL 10 MG/2ML IJ SOLN
INTRAMUSCULAR | Status: AC
  Filled 2014-05-15: qty 4

## 2014-05-15 MED ORDER — MIDAZOLAM HCL 10 MG/2ML IJ SOLN
INTRAMUSCULAR | Status: DC | PRN
  Administered 2014-05-15 (×5): 2.5 mg via INTRAVENOUS

## 2014-05-15 MED ORDER — DIPHENHYDRAMINE HCL 50 MG/ML IJ SOLN
INTRAMUSCULAR | Status: AC
  Filled 2014-05-15: qty 1

## 2014-05-15 MED ORDER — LACTATED RINGERS IV SOLN
INTRAVENOUS | Status: DC
  Administered 2014-05-15: 1000 mL via INTRAVENOUS

## 2014-05-15 MED ORDER — FENTANYL CITRATE 0.05 MG/ML IJ SOLN
INTRAMUSCULAR | Status: DC | PRN
  Administered 2014-05-15 (×5): 25 ug via INTRAVENOUS

## 2014-05-15 MED ORDER — DIPHENHYDRAMINE HCL 50 MG/ML IJ SOLN
INTRAMUSCULAR | Status: DC | PRN
  Administered 2014-05-15: 25 mg via INTRAVENOUS

## 2014-05-15 MED ORDER — BUTAMBEN-TETRACAINE-BENZOCAINE 2-2-14 % EX AERO
INHALATION_SPRAY | CUTANEOUS | Status: DC | PRN
  Administered 2014-05-15 (×2): 1 via TOPICAL

## 2014-05-15 MED ORDER — FENTANYL CITRATE 0.05 MG/ML IJ SOLN
INTRAMUSCULAR | Status: AC
  Filled 2014-05-15: qty 4

## 2014-05-15 NOTE — Interval H&P Note (Signed)
History and Physical Interval Note:  05/15/2014 7:31 AM  Heather [name]  has presented today for surgery, with the diagnosis of Weight loss Nausea Early Satiety Rectal bleeding  The various methods of treatment have been discussed with the patient and family. After consideration of risks, benefits and other options for treatment, the patient has consented to  Procedure(s): COLONOSCOPY (N/A) ESOPHAGOGASTRODUODENOSCOPY (EGD) (N/A) as a surgical intervention .  The patient's history has been reviewed, patient examined, no change in status, stable for surgery.  I have reviewed the patient's chart and labs.  Questions were answered to the patient's satisfaction.     Delfin Edis

## 2014-05-15 NOTE — Progress Notes (Signed)
Case discussed with Dr. Gill soon after the resident saw the patient.  We reviewed the resident's history and exam and pertinent patient test results.  I agree with the assessment, diagnosis, and plan of care documented in the resident's note. 

## 2014-05-15 NOTE — Op Note (Signed)
[facility] [place], [address]   ENDOSCOPY PROCEDURE REPORT  PATIENT: Heather [name], Heather [name]  MR#: [id number] BIRTHDATE: 1971/06/12 , 43  yrs. old GENDER: Female ENDOSCOPIST: Lafayette Dragon, MD REFERRED BY:  Dr N.Narendra PROCEDURE DATE:  05/15/2014 PROCEDURE:  EGD w/ biopsy ASA CLASS:     Class II INDICATIONS:  weight loss of 50 pounds.  Early satiety.  Nausea and vomiting.  Normal CT scan of the abdomen.  Smoker. MEDICATIONS: These medications were titrated to patient response per physician's verbal order, Fentanyl 75 mcg IV, and Versed 10 mg IV  TOPICAL ANESTHETIC: Cetacaine Spray  DESCRIPTION OF PROCEDURE: After the risks benefits and alternatives of the procedure were thoroughly explained, informed consent was obtained.  The Pentax Gastroscope [id number] endoscope was introduced through the mouth and advanced to the second portion of the duodenum. Without limitations.  The instrument was slowly withdrawn as the mucosa was fully examined.      Esophagus: proximal mid and distal esophageal mucosa was normal. Squamocolumnar junction was slightly irregular and there was a small islet Stomach: there was patchy erythema in the gastric antrum. There was no retained food in the stomach. Pyloric outlet was widely patent. Retroflexion of the endoscope revealed normal fundus and cardia. Biopsies were obtained from gastric and rule out H. pylori  Duodenum: duodenal bulb and descending duodenum was normal. Biopsies were obtained from the descending duodenum to rule out villous atrophy of gastric mucosa above the Z line. Biopsies were obtained to rule out Barrett's esophagus. There was no stricture or hiatal hernia[         The scope was then withdrawn from the patient and the procedure completed.  COMPLICATIONS: There were no complications. ENDOSCOPIC IMPRESSION:  slightly irregular Z line status post biopsies to rule out a Barrett's esophagus Minimal  antral gastritis. Status post biopsies to rule out H. pylori Duodenal biopsies to rule out villous atrophy Nothing to explain her early satiety and nausea and vomiting  RECOMMENDATIONS: 1.  Await pathology results 2.  Continue current meds 3.  Proceed with a Colonoscopy.  REPEAT EXAM:  eSigned:  Lafayette Dragon, MD 05/15/2014 10:02 AM   CC:  PATIENT NAME:  [name], [name] MR#: [id number]

## 2014-05-15 NOTE — H&P (View-Only) (Signed)
04/28/2014 Heather [name] [id number] 11-12-1970   HISTORY OF PRESENT ILLNESS:  This is a pleasant 43 year old female who is new to our practice and is here today with several issues and complaints. She reports that the biggest issues have been weight loss and nausea/fullness. She reports that over the last couple of years she has lost 80 pounds, 40 pounds being more recently over the last several months. This weight loss has been unintentional.  She also reports constant nausea that has been present for several months. She states that she feels very full after eating just a couple of bites of food.  She says that she would feel like she is starving, but after taking just a couple of bites of food she cannot eat any more. She constantly feels bloated. If she tries to eat too much at a time then she will end up vomiting. She denies any heartburn/reflux/indigestion type symptoms. She denies any dysphagia. She also reports that she has seen blood in her stool on several occasions. This is usually bright red in color but sometimes it rusty colored as well. She reports that she has had a colonoscopy by [facility] approximately 2011 or 2012. We do not have those records but are working on obtaining them currently. Reports that she had some polyps removed and says that she thinks some of them were left in there for some reason.    Past Medical History  Diagnosis Date  . Difficult intubation     pt was told difficult intubation at [facility]  . Blood dyscrasia   . Factor V deficiency     protein deficiency  . Current smoker    Past Surgical History  Procedure Laterality Date  . Abdominal hysterectomy    . Cesarean section      x 2  . Lesion removal  07/07/2011    Procedure: EXCISION VAGINAL LESION;  Surgeon: Elveria Royals;  Location: [facility];  Service: Gynecology;  Laterality: N/A;  . Laparoscopy  07/07/2011    Procedure: LAPAROSCOPY DIAGNOSTIC;  Surgeon: Elveria Royals;  Location: [facility];   Service: Gynecology;  Laterality: N/A;    reports that she has been smoking Cigarettes.  She has a 28 pack-year smoking history. She has never used smokeless tobacco. She reports that she drinks alcohol. She reports that she does not use illicit drugs. family history is not on file. Allergies  Allergen Reactions  . Iohexol Swelling    Updated to actual drug allergy  . Aspirin Nausea And Vomiting      Outpatient Encounter Prescriptions as of 04/28/2014  Medication Sig  . MOVIPREP 100 G SOLR Take 1 kit (200 g total) by mouth as directed.  . ondansetron (ZOFRAN ODT) 4 MG disintegrating tablet Take 1 tablet (4 mg total) by mouth every 8 (eight) hours as needed for nausea or vomiting.     REVIEW OF SYSTEMS  : All other systems reviewed and negative except where noted in the History of Present Illness.   PHYSICAL EXAM: BP 120/62  Pulse 86  Ht 5' 3" (1.6 m)  Wt 103 lb (46.72 kg)  BMI 18.25 kg/m2 General:  Thin white female in no acute distress Head: Normocephalic and atraumatic Eyes:  Sclerae anicteric, conjunctiva pink. Ears: Normal auditory acuity  Lungs: Clear throughout to auscultation Heart: Regular rate and rhythm Abdomen: Soft, non-distended.  Normal bowel sounds.  Non-tender. Musculoskeletal: Symmetrical with no gross deformities  Skin: No lesions on visible extremities Extremities: No  edema  Neurological: Alert oriented x 4, grossly non-focal Psychological:  Alert and cooperative. Normal mood and affect  ASSESSMENT AND PLAN: -Weight loss, unintentional: 80 pounds over a few years but 40 pounds most recently.  Rule out malignancy. -Nausea, constant and early satiety:  Rule out GERD/esophagitis vs malignancy, etc. -Rectal bleeding -Reported history of colon polyps:  Will get records from colonoscopy with [facility] from 3-4 years ago.  She reports that she had polyps, some of which she says were not removed for some reason.  *Will schedule for EGD and colonoscopy for now  (needs to be at [facility] hospital due to history of difficult intubation).  Once we get records from [place] and review colonoscopy report we could consider cancelling colonoscopy if not needed, but may just want to proceed anyway due to weight los and other complaints.  Will get CT scan of the abdomen and pelvis with contrast as well.  Will give zofran to use prn for now.

## 2014-05-15 NOTE — Op Note (Addendum)
[facility] [place], [address]   COLONOSCOPY PROCEDURE REPORT  PATIENT: Heather [name], Heather [name]  MR#: [id number] BIRTHDATE: October 12, 1970 , 43  yrs. old GENDER: Female ENDOSCOPIST: Lafayette Dragon, MD REFERRED BY:Dr N.Narendra PROCEDURE DATE:  05/15/2014 PROCEDURE:   Colonoscopy, surveillance First Screening Colonoscopy - Avg.  risk and is 50 yrs.  old or older - No.  Prior Negative Screening - Now for repeat screening. N/A  History of Adenoma - Now for follow-up colonoscopy & has been > or = to 3 yrs.  N/A History of Adenoma - Now for follow-up colonoscopy & has been > or = to 3 yrs.  Yes hx of adenoma.  Has been 3 or more years since last colonoscopy.  Polyps Removed Today? Yes. ASA CLASS:   Class II INDICATIONS:Multiple polyps removed on colonoscopy in 2011. Negative CT scan of the abdomen.  Weight loss of 50 pounds. Intermittent low-volume rectal bleeding.  Early satiety.  Negative upper endoscopy. MEDICATIONS: These medications were titrated to patient response per physician's verbal order, Fentanyl 25 mcg IV, Versed, and Fentanyl-Detailed 2.5 mg IV  DESCRIPTION OF PROCEDURE:   After the risks benefits and alternatives of the procedure were thoroughly explained, informed consent was obtained.  A digital rectal exam revealed no abnormalities of the rectum.   The     endoscope was introduced through the anus and advanced to the cecum, which was identified by both the appendix and ileocecal valve. No adverse events experienced.   The quality of the prep was Moviprep fair  The instrument was then slowly withdrawn as the colon was fully examined.      COLON FINDINGS: Five sessile polyps ranging between 3-42mm in size were found throughout the entire examined colon.  A polypectomy was performed with cold forceps, with a cold snare and using snare cautery.  The resection was complete and the polyp tissue was completely retrieved.   There was mild  diverticulosis noted in the sigmoid colon with associated muscular hypertrophy and angulation. Retroflexed views revealed no abnormalities. The time to cecum=8 minutes 13 seconds.  Withdrawal time=10 minutes 15 seconds.  The scope was withdrawn and the procedure completed. COMPLICATIONS: There were no complications.  ENDOSCOPIC IMPRESSION: 1.   Five sessile polyps ranging between 3-70mm in size were found throughout the entire examined colon; polypectomy was performed with cold forceps, at 15 cm,  with a cold snare at 30 and 50 cm, and using snare cautery in the cecum 2.   There was mild diverticulosis noted in the sigmoid colon 3. suboptimal prep precluding  satisfactory visualization of the entire colon  RECOMMENDATIONS: 1.  Await pathology results 2.  chronic constipation.  Patient should have a double prep prior to next colonoscopy.  For now we will start . magnesium oxide 500 mg daily 4.  Recall colonoscopy pending path report No explanation for weight loss other than functional reasons 5. Trial of Remeron 15 mg at bedtime to improve apetite   eSigned:  Lafayette Dragon, MD 05/18/2014 3:26 PM Revised: 05/18/2014 3:26 PM  cc:   PATIENT NAME:  Heather [name], Heather [name] MR#: [id number]

## 2014-05-15 NOTE — Discharge Instructions (Signed)
Esophagogastroduodenoscopy Care After Refer to this sheet in the next few weeks. These instructions provide you with information on caring for yourself after your procedure. Your caregiver may also give you more specific instructions. Your treatment has been planned according to current medical practices, but problems sometimes occur. Call your caregiver if you have any problems or questions after your procedure.  HOME CARE INSTRUCTIONS  Do not eat or drink anything until the numbing medicine (local anesthetic) has worn off and your gag reflex has returned. You will know that the local anesthetic has worn off when you can swallow comfortably.  Do not drive for 12 hours after the procedure or as directed by your caregiver.  Only take medicines as directed by your caregiver. SEEK MEDICAL CARE IF:   You cannot stop coughing.  You are not urinating at all or less than usual. SEEK IMMEDIATE MEDICAL CARE IF:  You have difficulty swallowing.  You cannot eat or drink.  You have worsening throat or chest pain.  You have dizziness, lightheadedness, or you faint.  You have nausea or vomiting.  You have chills.  You have a fever.  You have severe abdominal pain.  You have black, tarry, or bloody stools. Document Released: 09/04/2012 Document Reviewed: 09/04/2012 [facility] Patient Information 2015 [place]. This information is not intended to replace advice given to you by your health care provider. Make sure you discuss any questions you have with your health care provider. Colonoscopy, Care After These instructions give you information on caring for yourself after your procedure. Your doctor may also give you more specific instructions. Call your doctor if you have any problems or questions after your procedure. HOME CARE  Do not drive for 24 hours.  Do not sign important papers or use machinery for 24 hours.  You may shower.  You may go back to your usual activities, but go  slower for the first 24 hours.  Take rest breaks often during the first 24 hours.  Walk around or use warm packs on your belly (abdomen) if you have belly cramping or gas.  Drink enough fluids to keep your pee (urine) clear or pale yellow.  Resume your normal diet. Avoid heavy or fried foods.  Avoid drinking alcohol for 24 hours or as told by your doctor.  Only take medicines as told by your doctor. If a tissue sample (biopsy) was taken during the procedure:   Do not take aspirin or blood thinners for 7 days, or as told by your doctor.  Do not drink alcohol for 7 days, or as told by your doctor.  Eat soft foods for the first 24 hours. GET HELP IF: You still have a small amount of blood in your poop (stool) 2-3 days after the procedure. GET HELP RIGHT AWAY IF:  You have more than a small amount of blood in your poop.  You see clumps of tissue (blood clots) in your poop.  Your belly is puffy (swollen).  You feel sick to your stomach (nauseous) or throw up (vomit).  You have a fever.  You have belly pain that gets worse and medicine does not help. MAKE SURE YOU:  Understand these instructions.  Will watch your condition.  Will get help right away if you are not doing well or get worse. Document Released: 10/21/2010 Document Revised: 09/23/2013 Document Reviewed: 05/26/2013 [facility] Patient Information 2015 [place], [place]. This information is not intended to replace advice given to you by your health care provider. Make sure you  discuss any questions you have with your health care provider. ° °

## 2014-05-18 ENCOUNTER — Encounter: Payer: Self-pay | Admitting: Internal Medicine

## 2014-05-18 ENCOUNTER — Encounter (HOSPITAL_COMMUNITY): Payer: Self-pay | Admitting: Internal Medicine

## 2014-05-25 ENCOUNTER — Telehealth: Payer: Self-pay | Admitting: Internal Medicine

## 2014-05-25 MED ORDER — CLARITHROMYCIN 500 MG PO TABS: 500.0000 mg | ORAL_TABLET | Freq: Two times a day (BID) | ORAL | Status: DC

## 2014-05-25 MED ORDER — AMOXICILLIN 500 MG PO CAPS: 500.0000 mg | ORAL_CAPSULE | Freq: Three times a day (TID) | ORAL | Status: DC

## 2014-05-25 MED ORDER — OMEPRAZOLE 40 MG PO CPDR: DELAYED_RELEASE_CAPSULE | ORAL | Status: DC

## 2014-05-25 NOTE — Telephone Encounter (Signed)
Rx's sent to pharmacy. Patient notified of recommendations. 

## 2014-05-25 NOTE — Telephone Encounter (Signed)
Please call in Bioxin 500mg , #20 1 po bid, Amoxacillin 500mg , #30, 1 po tid and a PPI bid. X 10 days.

## 2014-05-25 NOTE — Telephone Encounter (Signed)
Patient received a letter stating she needs medications for H. Pylori. Please, advise.

## 2014-06-03 ENCOUNTER — Encounter: Payer: 59 | Admitting: Internal Medicine

## 2014-06-11 ENCOUNTER — Ambulatory Visit: Payer: 59 | Admitting: Gastroenterology

## 2014-06-25 ENCOUNTER — Ambulatory Visit (INDEPENDENT_AMBULATORY_CARE_PROVIDER_SITE_OTHER): Payer: 59 | Admitting: Internal Medicine

## 2014-06-25 ENCOUNTER — Other Ambulatory Visit: Payer: Self-pay | Admitting: Internal Medicine

## 2014-06-25 ENCOUNTER — Encounter: Payer: Self-pay | Admitting: Internal Medicine

## 2014-06-25 ENCOUNTER — Encounter: Payer: 59 | Admitting: Dietician

## 2014-06-25 VITALS — BP 108/69 | HR 74 | Temp 98.0°F | Ht 63.25 in | Wt 105.1 lb

## 2014-06-25 DIAGNOSIS — F17200 Nicotine dependence, unspecified, uncomplicated: Secondary | ICD-10-CM

## 2014-06-25 DIAGNOSIS — R634 Abnormal weight loss: Secondary | ICD-10-CM

## 2014-06-25 MED ORDER — VARENICLINE TARTRATE 0.5 MG X 11 & 1 MG X 42 PO MISC: ORAL | Status: DC

## 2014-06-25 NOTE — Addendum Note (Signed)
Addended by: Orson Gear on: 06/25/2014 02:02 PM   Modules accepted: Orders

## 2014-06-25 NOTE — Assessment & Plan Note (Addendum)
-   Weight has improved approx 4 lbs since last visit. - Encouraged patient to eat small, frequent meals - She is not able to drink boost or ensure- does not like the taste but does drink carnation instant breakfast and another protein shake - c/w zofran prn n/v - Would check HIV  - Pt with normal CT scans and EGD/colonoscopy with no evidence of malignancy - Pt has been prescribed remeron by outside provider (likely GI)

## 2014-06-25 NOTE — Progress Notes (Signed)
   Subjective:    Patient ID: Heather [name], female    DOB: 1971-02-22, 43 y.o.   MRN: [id number]  HPI Pt here for routine follow up. I informed her of her colonoscopy results - tubular adenomas and of her EGD results- + H. Pylori. Mild chronic gastritis, duodenitits.  We spoke about smoking cessation - She is motivated to quit and has cut back on her smoking. She is now well under a pack a day.  She also wants to follow up with her gynecologist and is working on paying them their bill before she follows up with them. She also has intermittent nausea and takes zofran prn. Still has early satiety but weight has improved since last visit.   Review of Systems  Constitutional: Positive for appetite change and unexpected weight change. Negative for fever, activity change and fatigue.  HENT: Negative.   Eyes: Negative.   Respiratory: Negative.   Cardiovascular: Negative.   Gastrointestinal: Positive for nausea and abdominal pain.  Endocrine: Negative.   Genitourinary: Negative.   Musculoskeletal: Negative.   Skin: Negative.   Neurological: Negative.        Objective:   Physical Exam  Constitutional: She is oriented to person, place, and time. No distress.  Poorly nourished  HENT:  Head: Normocephalic and atraumatic.  Eyes: Conjunctivae are normal.  Neck: Normal range of motion.  Cardiovascular: Normal rate, regular rhythm and normal heart sounds.   No murmur heard. Pulmonary/Chest: Effort normal and breath sounds normal. No respiratory distress. She has no wheezes.  Abdominal: Soft. Bowel sounds are normal. She exhibits no distension. There is tenderness. There is no rebound.  Mild RUQ tenderness +  Musculoskeletal: Normal range of motion. She exhibits no edema and no tenderness.  Neurological: She is alert and oriented to person, place, and time.  Skin: Skin is warm and dry. She is not diaphoretic.          Assessment & Plan:  Please see problem based charting for  assessment and plan

## 2014-06-25 NOTE — Assessment & Plan Note (Signed)
-   She is now smoking less than a PPD - I have prescribed chantix and discussed the importance of cessation - We will set a quit date 1 week after she starts taking the medication - She has been doing well in keeping herself under a PPD and I congratulated her on that

## 2014-06-25 NOTE — Patient Instructions (Signed)
- Congratulations on cutting down on smoking - I have prescribed chantix to help you quit. Please start 1 week before your quit date - I advise small, frequent meals  - c/w zofran as needed for nausea.  - Please call me with any issues  Smoking Cessation Quitting smoking is important to your health and has many advantages. However, it is not always easy to quit since nicotine is a very addictive drug. Oftentimes, people try 3 times or more before being able to quit. This document explains the best ways for you to prepare to quit smoking. Quitting takes hard work and a lot of effort, but you can do it. ADVANTAGES OF QUITTING SMOKING  You will live longer, feel better, and live better.  Your body will feel the impact of quitting smoking almost immediately.  Within 20 minutes, blood pressure decreases. Your pulse returns to its normal level.  After 8 hours, carbon monoxide levels in the blood return to normal. Your oxygen level increases.  After 24 hours, the chance of having a heart attack starts to decrease. Your breath, hair, and body stop smelling like smoke.  After 48 hours, damaged nerve endings begin to recover. Your sense of taste and smell improve.  After 72 hours, the body is virtually free of nicotine. Your bronchial tubes relax and breathing becomes easier.  After 2 to 12 weeks, lungs can hold more air. Exercise becomes easier and circulation improves.  The risk of having a heart attack, stroke, cancer, or lung disease is greatly reduced.  After 1 year, the risk of coronary heart disease is cut in half.  After 5 years, the risk of stroke falls to the same as a nonsmoker.  After 10 years, the risk of lung cancer is cut in half and the risk of other cancers decreases significantly.  After 15 years, the risk of coronary heart disease drops, usually to the level of a nonsmoker.  If you are pregnant, quitting smoking will improve your chances of having a healthy baby.  The  people you live with, especially any children, will be healthier.  You will have extra money to spend on things other than cigarettes. QUESTIONS TO THINK ABOUT BEFORE ATTEMPTING TO QUIT You may want to talk about your answers with your health care provider.  Why do you want to quit?  If you tried to quit in the past, what helped and what did not?  What will be the most difficult situations for you after you quit? How will you plan to handle them?  Who can help you through the tough times? Your family? Friends? A health care provider?  What pleasures do you get from smoking? What ways can you still get pleasure if you quit? Here are some questions to ask your health care provider:  How can you help me to be successful at quitting?  What medicine do you think would be best for me and how should I take it?  What should I do if I need more help?  What is smoking withdrawal like? How can I get information on withdrawal? GET READY  Set a quit date.  Change your environment by getting rid of all cigarettes, ashtrays, matches, and lighters in your home, car, or work. Do not let people smoke in your home.  Review your past attempts to quit. Think about what worked and what did not. GET SUPPORT AND ENCOURAGEMENT You have a better chance of being successful if you have help. You can get  support in many ways.  Tell your family, friends, and coworkers that you are going to quit and need their support. Ask them not to smoke around you.  Get individual, group, or telephone counseling and support. Programs are available at [employer] and health centers. Call your local health department for information about programs in your area.  Spiritual beliefs and practices may help some smokers quit.  Download a "quit meter" on your computer to keep track of quit statistics, such as how long you have gone without smoking, cigarettes not smoked, and money saved.  Get a self-help book about  quitting smoking and staying off tobacco. [place] yourself from urges to smoke. Talk to someone, go for a walk, or occupy your time with a task.  Change your normal routine. Take a different route to work. Drink tea instead of coffee. Eat breakfast in a different place.  Reduce your stress. Take a hot bath, exercise, or read a book.  Plan something enjoyable to do every day. Reward yourself for not smoking.  Explore interactive web-based programs that specialize in helping you quit. GET MEDICINE AND USE IT CORRECTLY Medicines can help you stop smoking and decrease the urge to smoke. Combining medicine with the above behavioral methods and support can greatly increase your chances of successfully quitting smoking.  Nicotine replacement therapy helps deliver nicotine to your body without the negative effects and risks of smoking. Nicotine replacement therapy includes nicotine gum, lozenges, inhalers, nasal sprays, and skin patches. Some may be available over-the-counter and others require a prescription.  Antidepressant medicine helps people abstain from smoking, but how this works is unknown. This medicine is available by prescription.  Nicotinic receptor partial agonist medicine simulates the effect of nicotine in your brain. This medicine is available by prescription. Ask your health care provider for advice about which medicines to use and how to use them based on your health history. Your health care provider will tell you what side effects to look out for if you choose to be on a medicine or therapy. Carefully read the information on the package. Do not use any other product containing nicotine while using a nicotine replacement product.  RELAPSE OR DIFFICULT SITUATIONS Most relapses occur within the first 3 months after quitting. Do not be discouraged if you start smoking again. Remember, most people try several times before finally quitting. You may have  symptoms of withdrawal because your body is used to nicotine. You may crave cigarettes, be irritable, feel very hungry, cough often, get headaches, or have difficulty concentrating. The withdrawal symptoms are only temporary. They are strongest when you first quit, but they will go away within 10-14 days. To reduce the chances of relapse, try to:  Avoid drinking alcohol. Drinking lowers your chances of successfully quitting.  Reduce the amount of caffeine you consume. Once you quit smoking, the amount of caffeine in your body increases and can give you symptoms, such as a rapid heartbeat, sweating, and anxiety.  Avoid smokers because they can make you want to smoke.  Do not let weight gain distract you. Many smokers will gain weight when they quit, usually less than 10 pounds. Eat a healthy diet and stay active. You can always lose the weight gained after you quit.  Find ways to improve your mood other than smoking. FOR MORE INFORMATION  www.smokefree.gov  Document Released: 09/12/2001 Document Revised: 02/02/2014 Document Reviewed: 12/28/2011 [facility] Patient Information 2015 [place], [place]. This information is not  intended to replace advice given to you by your health care provider. Make sure you discuss any questions you have with your health care provider.

## 2014-06-26 ENCOUNTER — Encounter: Payer: 59 | Admitting: Dietician

## 2014-06-26 ENCOUNTER — Telehealth: Payer: Self-pay | Admitting: Internal Medicine

## 2014-06-26 LAB — HIV ANTIBODY (ROUTINE TESTING W REFLEX): HIV 1&2 Ab, 4th Generation: NONREACTIVE

## 2014-06-26 NOTE — Telephone Encounter (Signed)
Pt called and informed of negative HIV test results

## 2014-06-28 LAB — HIV-1 RNA QUANT-NO REFLEX-BLD

## 2014-07-13 ENCOUNTER — Telehealth: Payer: Self-pay | Admitting: Dietician

## 2014-07-15 ENCOUNTER — Ambulatory Visit (INDEPENDENT_AMBULATORY_CARE_PROVIDER_SITE_OTHER): Payer: 59 | Admitting: Dietician

## 2014-07-15 VITALS — Ht 63.25 in | Wt 109.7 lb

## 2014-07-15 DIAGNOSIS — R634 Abnormal weight loss: Secondary | ICD-10-CM

## 2014-07-15 NOTE — Telephone Encounter (Signed)
Thank you Butch Penny

## 2014-07-15 NOTE — Telephone Encounter (Signed)
Having trouble coordinating RD visit with work schedule, She wants to reschedule appointment with RD. She will get in touch to reschedule

## 2014-07-15 NOTE — Progress Notes (Signed)
Medical Nutrition Therapy:  Appt start time: 1430 end time:  0938.  Assessment:  Primary concerns today: unintentional weight loss.  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 She reports that she sleeps poorly 3-4 hours a night of broken sleep and has night sweats recently. Reports history of total hysterectomy. Her poor sleep has coincided with her weight loss.Regarding her weight, she gained weight since last visit and does not think she has changed her intake. Her highest adult weight reported is: 186# before she became a single mother. Then was 150# for a while. Describes herself as a picky eater, hungry all the time:, but gets full before finishing food. Skips breakfast and lunch on most days and drinks regular black coffee 1 pot on weekdays and 3-4 pots of  on weekends. She says she started drinking coffee when young and her intake has gradually increased.  Ht 63.25"     Current weight 109.7#      IBW- 118# (107# - 130#)    BMI 19.27-low normal Weight Hx per EPIC: 119# highest weight recorded since 2008, last year 102#, weight stable between 101.5# and 106 over past year. No food allergies, bowels within normal. No problems with nausea, chewing, swallowing.  Preferred Learning Style: Visual and hands on Learning Readiness: Ready MEDICATIONS: caffeine intake high enough it should be listed as supplement, this was discussed with patient today   DIETARY INTAKE: Usual eating pattern includes 1-3 meals and 0 snacks per day. Weekday calories < 1000/day, weekends likely ~1400 calories/day, with overall average ~ 1100-1200 calories/day chronically, increased thermogenesis from increased caffeine intake may increase needs further increasing deficit and weight loss. Everyday foods include black coffee, peanut butter and jelly sandwich, eggs, bacon or sausage.  Avoided foods include vegetables, soda, juice, milk except in cereal which is not often.   24-hr recall:  B ( AM): black  coffee x 2 cups QAM, skips 3 days a week; pancake x 1, 2 sausage links or bacon on weekends, cheese omelet and bacon 2 days during work week Snack: Coffee  on weekends L ( PM): 1 cup black coffee on work days and no food, 2+ cup and peanut butter & jelly or ham sandwich on weekends Snack: Coffee  on weekends D ( PM): eats out once a week- Cook out- double burger and chili fires, cannot finish either, cooks most night, meatloaf 1/2 piece, mashed potatoes 6-7 bites, 1- 2 cups coffee Snk ( PM): snack cake or pack of peanut butter crackers once in a while Beverages: 2 cups water, black coffee (reports 800-900 mg caffeine on weekdays, > 2500 mg cafeine/day on weekends)   Usual physical activity: work and cooking, errands and house chores, no formal activities or exercise reported  Progress Towards Goal(s):  In progress.   Nutritional Diagnosis:  Heather [name]-3.2 Unintentional weight loss As related to large doses of caffeine intake with side effects of increased energy with anorexia and decreased calorie consumption.  As evidenced by her reported intake of coffee and 24 hour recall. .    Intervention:  Nutrition education about effects of large amounts of caffeine on appetite, sleep quality and suggestions for easy snacks to keep on hand at work that might appeal to her. Goal setting used to help patient make a plan for decreasing caffeine gradually and increasing weight Coordination of care: discuss with PCP to encourage gradual decrease in intake, may need to decrease decaf coffee as well to increase appetite Teaching Method  Utilized: Auditory & Hands on Handouts given during visit include:AVS Barriers to learning/adherence to lifestyle change: work life balance, normal resistance to change along standing habit Expect good outcomes. Target weight not established as yet, suggest range of 110-120# with 115# most of time. Demonstrated degree of understanding via:  Teach Back   Monitoring/Evaluation:  Dietary  intake, exercise, 3-4 weeks, and body weight in 4 week(s).

## 2014-07-15 NOTE — Patient Instructions (Signed)
Please make an appointment for 3-4 weeks  For the next few weeks you going to try decrease caffeine in coffee by using more decaf.  Also consider bringing in a loaf of bread and peanut butter for a quick lunch or breakfast. Grab our jelly as needed.   Butch Penny [phone].

## 2014-07-16 ENCOUNTER — Encounter: Payer: Self-pay | Admitting: Dietician

## 2014-07-17 ENCOUNTER — Other Ambulatory Visit: Payer: Self-pay

## 2014-07-30 ENCOUNTER — Ambulatory Visit (INDEPENDENT_AMBULATORY_CARE_PROVIDER_SITE_OTHER): Payer: 59 | Admitting: Dietician

## 2014-07-30 ENCOUNTER — Encounter: Payer: Self-pay | Admitting: Dietician

## 2014-07-30 VITALS — Wt 112.2 lb

## 2014-07-30 DIAGNOSIS — R634 Abnormal weight loss: Secondary | ICD-10-CM

## 2014-07-30 NOTE — Progress Notes (Signed)
  Medical Nutrition Therapy:  Appt start time: 0867 end time:  .  Assessment:  Primary concerns today: unintentional weight loss.  Weight improved, now drinking mostly decaf coffee. Left food records at home but feels that by keeping the records she realized many foods she won't touch now she used to enjoy as child and into her teens.  She says it is not important for her to include these foods. She doesn;t think her food intake has changed except that she only has one cup of regular coffee a day and uses decaf the rest of the day..   Beverages: decaf coffee x 1-2 pots on weekdays, 3-4 on weekends Current weight #  112.2    IBW- 118# (107# - 130#)    BMI  19.71- still low normal weight.  her sleep is still not good, Suggested she consider [facility] program on insomnia. She denied interest in viewing it during today';s visit.  MEDICATIONS:none     Progress Towards Goal(s):  Some progress.   Nutritional Diagnosis:  [place]-3.2 Unintentional weight loss As related to large doses of caffeine intake with side effects of increased energy with anorexia and decreased calorie consumption improving  As evidenced by her reported intake of decaf coffee and weight gain.    Intervention:  Nutrition education about other ways to gain weight and be healthier- quit smoking(patient is not ready for this yet.), better sleep, more fruits vegetables and whole grains-  Expect good outcomes. Target weight  110-120# with 115# most of time. Demonstrated degree of understanding via:  Teach Back   Monitoring/Evaluation:  Dietary intake- food records, and body weight in 8 week(s).

## 2014-07-30 NOTE — Patient Instructions (Addendum)
Good Job!!!  Your weight was 112.2 today BMI 19.71.   Goal weight is 118# +/- 12 #   New goals: food records and continue decaf coffee

## 2014-09-10 ENCOUNTER — Ambulatory Visit: Payer: 59 | Admitting: Dietician

## 2014-11-24 ENCOUNTER — Ambulatory Visit (INDEPENDENT_AMBULATORY_CARE_PROVIDER_SITE_OTHER): Payer: 59 | Admitting: Internal Medicine

## 2014-11-24 ENCOUNTER — Encounter: Payer: Self-pay | Admitting: Internal Medicine

## 2014-11-24 VITALS — BP 120/77 | HR 78 | Ht 63.0 in | Wt 121.7 lb

## 2014-11-24 DIAGNOSIS — R519 Headache, unspecified: Secondary | ICD-10-CM

## 2014-11-24 DIAGNOSIS — R51 Headache: Secondary | ICD-10-CM

## 2014-11-24 MED ORDER — CARBAMIDE PEROXIDE 6.5 % OT SOLN: 5.0000 [drp] | Freq: Two times a day (BID) | OTIC | Status: DC

## 2014-11-24 MED ORDER — BUTALBITAL-APAP-CAFFEINE 50-325-40 MG PO TABS: 1.0000 | ORAL_TABLET | Freq: Four times a day (QID) | ORAL | Status: DC | PRN

## 2014-11-24 NOTE — Patient Instructions (Signed)
General Instructions:  1. Please schedule follow up appointment for 2 weeks.   Get CT head within next 1-2 days.   2. Please take all medications as previously prescribed with the following changes:  Start taking Fioricet every 6 hours as needed for headache.   3. If you have worsening of your symptoms or new symptoms arise, please call the clinic ([phone]), or go to the ER immediately if symptoms are severe.  You have done a great job in taking all your medications. Please continue to do this.  Please bring your medicines with you each time you come to clinic.  Medicines may include prescription medications, over-the-counter medications, herbal remedies, eye drops, vitamins, or other pills.   Progress Toward Treatment Goals:  No flowsheet data found.  Self Care Goals & Plans:  Self Care Goal 11/24/2014  Manage my medications (No Data)  Eat healthy foods drink diet soda or water instead of juice or soda; eat more vegetables; eat foods that are low in salt; eat baked foods instead of fried foods  Be physically active -    No flowsheet data found.   Care Management & Community Referrals:  No flowsheet data found.

## 2014-11-24 NOTE — Assessment & Plan Note (Signed)
Patient w/ left-sided headache, radiating into her neck. No light or sound sensitivity. No fever, chills, nausea, vomiting. No pain w/ change in position. May be tension headache vs atypical migraine. Do not suspect cluster or meningitis. Given prolonged nature, feel that CT head is necessary at this time. No neurological deficits. CN's intact, no sensory or motor abnormalities. Hearing change in left ear most likely related to cerumen impaction. No sinus pressure or pain, doubt sinusitis as etiology of headache.  -CT head w/out contrast -Fioricet 1-2 tabs q6h prn -Irrigated left ear without improvement. Hard cerumen buildup. Gave Debrox -Patient to return in 1 week

## 2014-11-24 NOTE — Progress Notes (Signed)
Subjective:   Patient ID: Heather [name] female   DOB: 1971-05-29 44 y.o.   MRN: [id number]  HPI: [name] is a 44 y.o. female w/ PMHx of Factor V Leiden, COPD, tobacco abuse, and weight loss, presents to the clinic today for an acute visit for headaches. States the headaches started about 2-3 weeks ago, primarily left-sided, radiating into her neck. Claims they have progressively gotten worse over the past few days, making it hard to concentrate at work. She denies any recent fever, chills, nausea, or vomiting. Claims the pain is worsened w/ fast movements and coughing, but not necessarily associated w/ change in position. She also claims she has had an acute change in the hearing in her left ear yesterday, but says it is not as bad today. Also admits to mild blurry vision. No increased lacrimation, rhinorrhea, dizziness, or lightheadedness. Also denies sensitivity to light or sound. Has tried OTC medications w/ no relief. She says she has not been overly stressed at work, is not having pain in her eyes, and has never had a h/o migraines  Past Medical History  Diagnosis Date  . Difficult intubation     pt was told difficult intubation at [facility]  . Blood dyscrasia   . Factor V deficiency     protein deficiency  . Current smoker   . Rapid resting heart rate     happens infrequently  . COPD (chronic obstructive pulmonary disease)    Current Outpatient Prescriptions  Medication Sig Dispense Refill  . butalbital-acetaminophen-caffeine (FIORICET) 50-325-40 MG per tablet Take 1-2 tablets by mouth every 6 (six) hours as needed for headache. 30 tablet 0  . carbamide peroxide (DEBROX) 6.5 % otic solution Place 5 drops into the left ear 2 (two) times daily. 15 mL 0  . clarithromycin (BIAXIN) 500 MG tablet Take 1 tablet (500 mg total) by mouth 2 (two) times daily. 20 tablet 0  . mirtazapine (REMERON) 15 MG tablet Take 1 tablet (15 mg total) by mouth at bedtime. 30 tablet 3  .  omeprazole (PRILOSEC) 40 MG capsule Take one po bid x 10 days 20 capsule 0  . ondansetron (ZOFRAN ODT) 4 MG disintegrating tablet Take 1 tablet (4 mg total) by mouth every 8 (eight) hours as needed for nausea or vomiting. 30 tablet 0  . ranitidine (ZANTAC) 150 MG tablet Take 1 tablet (150 mg total) by mouth 2 (two) times daily. 60 tablet 0  . varenicline (CHANTIX STARTING MONTH PAK) 0.5 MG X 11 & 1 MG X 42 tablet Take one 0.5 mg tablet by mouth once daily for 3 days, then increase to one 0.5 mg tablet twice daily for 4 days, then increase to one 1 mg tablet twice daily. 53 tablet 0   No current facility-administered medications for this visit.    Review of Systems: General: Denies fever, chills, diaphoresis, appetite change and fatigue.  Respiratory: Denies SOB, DOE, cough, and wheezing.   Cardiovascular: Denies chest pain and palpitations.  Gastrointestinal: Denies nausea, vomiting, abdominal pain, and diarrhea.  Genitourinary: Denies dysuria, increased frequency, and flank pain. Endocrine: Denies hot or cold intolerance, polyuria, and polydipsia. Musculoskeletal: Positive for neck pain. Denies myalgias, back pain, joint swelling, arthralgias and gait problem.  Skin: Denies pallor, rash and wounds.  Neurological: Denies dizziness, seizures, syncope, weakness, lightheadedness, numbness and headaches.  Psychiatric/Behavioral: Denies mood changes, and sleep disturbances.  Objective:   Physical Exam: Filed Vitals:   11/24/14 1616  BP: 120/77  Pulse:  78  Height: 5\' 3"  (1.6 m)  Weight: 121 lb 11.2 oz (55.203 kg)  SpO2: 100%    General: Thin white female, alert, cooperative, NAD. HEENT: PERRL, EOMI. Moist mucus membranes. Large left cerumen burden. Neck: Full range of motion without pain, supple, no lymphadenopathy or carotid bruits Lungs: Clear to ascultation bilaterally, normal work of respiration, no wheezes, rales, rhonchi Heart: RRR, no murmurs, gallops, or rubs Abdomen: Soft,  non-tender, non-distended, BS + Extremities: No cyanosis, clubbing, or edema Neurologic: Alert & oriented x3, cranial nerves II-XII intact, strength grossly intact, sensation intact to light touch   Assessment & Plan:   Please see problem based assessment and plan.

## 2014-11-25 NOTE — Progress Notes (Signed)
INTERNAL MEDICINE TEACHING ATTENDING ADDENDUM - Camile Esters, MD: I reviewed and discussed at the time of visit with the resident Dr. Jones, the patient's medical history, physical examination, diagnosis and results of pertinent tests and treatment and I agree with the patient's care as documented.  

## 2014-12-07 ENCOUNTER — Encounter: Payer: Self-pay | Admitting: *Deleted

## 2015-04-07 ENCOUNTER — Encounter: Payer: Self-pay | Admitting: Internal Medicine

## 2015-04-09 ENCOUNTER — Encounter: Payer: Self-pay | Admitting: Internal Medicine

## 2015-04-12 ENCOUNTER — Telehealth: Payer: Self-pay | Admitting: Internal Medicine

## 2015-04-12 NOTE — Telephone Encounter (Signed)
Call to patient to confirm appointment for 04/13/15 at 8:15 lmtcb

## 2015-04-13 ENCOUNTER — Ambulatory Visit (INDEPENDENT_AMBULATORY_CARE_PROVIDER_SITE_OTHER): Payer: 59 | Admitting: Internal Medicine

## 2015-04-13 ENCOUNTER — Encounter: Payer: Self-pay | Admitting: Internal Medicine

## 2015-04-13 VITALS — BP 105/70 | HR 73 | Temp 98.5°F | Ht 63.25 in | Wt 128.8 lb

## 2015-04-13 DIAGNOSIS — R7303 Prediabetes: Secondary | ICD-10-CM

## 2015-04-13 DIAGNOSIS — K5790 Diverticulosis of intestine, part unspecified, without perforation or abscess without bleeding: Secondary | ICD-10-CM | POA: Insufficient documentation

## 2015-04-13 DIAGNOSIS — R252 Cramp and spasm: Secondary | ICD-10-CM | POA: Diagnosis not present

## 2015-04-13 DIAGNOSIS — Z90710 Acquired absence of both cervix and uterus: Secondary | ICD-10-CM | POA: Diagnosis not present

## 2015-04-13 DIAGNOSIS — Z86010 Personal history of colonic polyps: Secondary | ICD-10-CM

## 2015-04-13 DIAGNOSIS — D6851 Activated protein C resistance: Secondary | ICD-10-CM | POA: Insufficient documentation

## 2015-04-13 DIAGNOSIS — R7309 Other abnormal glucose: Secondary | ICD-10-CM | POA: Diagnosis not present

## 2015-04-13 DIAGNOSIS — Z0000 Encounter for general adult medical examination without abnormal findings: Secondary | ICD-10-CM

## 2015-04-13 LAB — MAGNESIUM: Magnesium: 2.2 mg/dL (ref 1.5–2.5)

## 2015-04-13 LAB — COMPLETE METABOLIC PANEL WITH GFR
ALBUMIN: 4.8 g/dL (ref 3.5–5.2)
ALK PHOS: 52 U/L (ref 39–117)
ALT: 12 U/L (ref 0–35)
AST: 16 U/L (ref 0–37)
BUN: 11 mg/dL (ref 6–23)
CHLORIDE: 107 meq/L (ref 96–112)
CO2: 28 mEq/L (ref 19–32)
CREATININE: 0.67 mg/dL (ref 0.50–1.10)
Calcium: 10.1 mg/dL (ref 8.4–10.5)
GFR, Est African American: >89 mL/min
GLUCOSE: 96 mg/dL (ref 70–99)
Potassium: 4.5 mEq/L (ref 3.5–5.3)
Sodium: 141 mEq/L (ref 135–145)
Total Bilirubin: 0.5 mg/dL (ref 0.2–1.2)
Total Protein: 7.2 g/dL (ref 6.0–8.3)

## 2015-04-13 LAB — CBC WITH DIFFERENTIAL/PLATELET
BASOS ABS: 0.1 10*3/uL (ref 0.0–0.1)
Basophils Relative: 1 % (ref 0–1)
Eosinophils Absolute: 0.1 10*3/uL (ref 0.0–0.7)
Eosinophils Relative: 1 % (ref 0–5)
HEMATOCRIT: 39.3 % (ref 36.0–46.0)
HEMOGLOBIN: 13.4 g/dL (ref 12.0–15.0)
LYMPHS ABS: 1.3 10*3/uL (ref 0.7–4.0)
LYMPHS PCT: 22 % (ref 12–46)
MCH: 31.3 pg (ref 26.0–34.0)
MCHC: 34.1 g/dL (ref 30.0–36.0)
MCV: 91.8 fL (ref 78.0–100.0)
MONO ABS: 0.3 10*3/uL (ref 0.1–1.0)
MPV: 11.9 fL (ref 8.6–12.4)
Monocytes Relative: 6 % (ref 3–12)
NEUTROS PCT: 70 % (ref 43–77)
Neutro Abs: 4 10*3/uL (ref 1.7–7.7)
Platelets: 151 10*3/uL (ref 150–400)
RBC: 4.28 MIL/uL (ref 3.87–5.11)
RDW: 13.9 % (ref 11.5–15.5)
WBC: 5.7 10*3/uL (ref 4.0–10.5)

## 2015-04-13 LAB — GLUCOSE, CAPILLARY: Glucose-Capillary: 101 mg/dL — ABNORMAL HIGH (ref 65–99)

## 2015-04-13 LAB — POCT GLYCOSYLATED HEMOGLOBIN (HGB A1C): Hemoglobin A1C: 5.2

## 2015-04-13 LAB — D-DIMER, QUANTITATIVE (NOT AT ARMC): D DIMER QUANT: 0.33 ug{FEU}/mL (ref 0.00–0.48)

## 2015-04-13 LAB — CK: Total CK: 67 U/L (ref 7–177)

## 2015-04-13 LAB — TSH: TSH: 1.702 u[IU]/mL (ref 0.350–4.500)

## 2015-04-13 NOTE — Assessment & Plan Note (Addendum)
-  Pt reports possibly having received tdap vaccination in the past 10 years, will try to obtain records.  -Pt is s/p hysterectomy, pap smear testing not indicated.

## 2015-04-13 NOTE — Assessment & Plan Note (Addendum)
Assessment: Pt with last A1c of 5.7 on 04/02/14 who presents with CBG of 101 and improved A1c of 5.2  Plan:  -Obtain POCT A1c ----> 5.2 -Repeat A1c in 6 month (January 2017) -Continue lifestyle modifcation

## 2015-04-13 NOTE — Assessment & Plan Note (Addendum)
Assessment: Pt with 4-day history of right LE muscle cramping of unclear etiology who presents with recent fall due to pain.   Plan:  -Obtain CMP, CBC w/diff, CK, TSH, 25-OH vitamin D, and magnesium level -Obtain stat d-dimer in setting of factor V Leiden deficiency to assess for DVT ----> normal -Pt instructed to take OTC tylenol or ibuprofen PRN pain

## 2015-04-13 NOTE — Patient Instructions (Addendum)
-  Will check your bloodwork today and call you with the results -If the d-dimer is elevated we may need to check an ultrasound of your leg -Take over the counter tylenol or advil for pain -Please come back to see Dr. Dareen Piano on August 2nd -Very nice meeting you!   General Instructions:   Progress Toward Treatment Goals:  No flowsheet data found.  Self Care Goals & Plans:  Self Care Goal 04/13/2015  Manage my medications follow the sick day instructions if I am sick  Eat healthy foods eat fruit for snacks and desserts; eat foods that are low in salt; eat smaller portions  Be physically active find an activity I enjoy    No flowsheet data found.   Care Management & Community Referrals:  No flowsheet data found.

## 2015-04-13 NOTE — Progress Notes (Signed)
Patient ID: Heather [name], female   DOB: 04-09-1971, 44 y.o.   MRN: [id number]     Subjective:   Patient ID: Heather [name] female   DOB: 10/07/1970 44 y.o.   MRN: [id number]  HPI: [name] is a 44 y.o. very pleasant woman with past medical history of heterozygous Factor V deficiency, COPD, diverticulosis, colon polyps s/p removal, hyperlipidemia, and tobacco use who presents with right lower extremity cramping.   She reports waking up in the middle of the night about 3-4 days ago with severe right lower extremity cramping (mostly in calf) that felt like a charlie horse with associated burning and mild swelling and tingling. She denies erythema or warmth.   She denies recent injury/trauma/fall, right knee pain, or back pain. She reports her leg giving out and falling as a result due to pain. She is currently ambulating without difficulty. She has history of heterozygous Factor V deficiency but denies history of prior DVT or PE.  She is not on any medications and has not taken anything for pain. She reports having similar occasional episodes in the past that were not as severe.  She has history of prediabetes with last A1c of 5.7 on 04/02/14. She reports chronic blurry vision, polyuria, and peripheral neuropathy but denies polydipsia or polyphagia. She has family history of diabetes.    Past Medical History  Diagnosis Date  . Difficult intubation     pt was told difficult intubation at [facility]  . Blood dyscrasia   . Factor V deficiency     protein deficiency  . Current smoker   . Rapid resting heart rate     happens infrequently  . COPD (chronic obstructive pulmonary disease)    Current Outpatient Prescriptions  Medication Sig Dispense Refill  . butalbital-acetaminophen-caffeine (FIORICET) 50-325-40 MG per tablet Take 1-2 tablets by mouth every 6 (six) hours as needed for headache. 30 tablet 0  . carbamide peroxide (DEBROX) 6.5 % otic solution Place 5 drops into the  left ear 2 (two) times daily. 15 mL 0  . clarithromycin (BIAXIN) 500 MG tablet Take 1 tablet (500 mg total) by mouth 2 (two) times daily. 20 tablet 0  . mirtazapine (REMERON) 15 MG tablet Take 1 tablet (15 mg total) by mouth at bedtime. 30 tablet 3  . omeprazole (PRILOSEC) 40 MG capsule Take one po bid x 10 days 20 capsule 0  . ondansetron (ZOFRAN ODT) 4 MG disintegrating tablet Take 1 tablet (4 mg total) by mouth every 8 (eight) hours as needed for nausea or vomiting. 30 tablet 0  . ranitidine (ZANTAC) 150 MG tablet Take 1 tablet (150 mg total) by mouth 2 (two) times daily. 60 tablet 0  . varenicline (CHANTIX STARTING MONTH PAK) 0.5 MG X 11 & 1 MG X 42 tablet Take one 0.5 mg tablet by mouth once daily for 3 days, then increase to one 0.5 mg tablet twice daily for 4 days, then increase to one 1 mg tablet twice daily. 53 tablet 0   No current facility-administered medications for this visit.   No family history on file. History   Social History  . Marital Status: Divorced    Spouse Name: N/A  . Number of Children: N/A  . Years of Education: N/A   Social History Main Topics  . Smoking status: Current Every Day Smoker -- 1.00 packs/day for 28 years    Types: Cigarettes  . Smokeless tobacco: Never Used     Comment: Cannot afford  Chantex.  Cutting back  . Alcohol Use: 0.0 oz/week    0 Standard drinks or equivalent per week     Comment: socially once a month - mixed drinks  . Drug Use: No  . Sexual Activity: Yes     Comment:  [id number]   Other Topics Concern  . None   Social History Narrative   Review of Systems: Review of Systems  Constitutional: Negative for fever and chills.       Intentional weight gain  Eyes: Positive for blurred vision (chronic).  Respiratory: Positive for cough (chronic). Negative for shortness of breath and wheezing.   Cardiovascular: Positive for leg swelling (trace b/l LE ). Negative for chest pain.  Gastrointestinal: Negative for nausea,  vomiting, abdominal pain, diarrhea and constipation.  Genitourinary: Negative for dysuria and urgency.  Musculoskeletal: Positive for myalgias (right LE for 3-4 days), joint pain (right shoulder) and falls. Negative for back pain.  Neurological: Positive for dizziness (lightheadedness), tingling (right LE), sensory change (chronic in fingers and toes) and headaches.  Endo/Heme/Allergies: Negative for polydipsia.    Objective:  Physical Exam: Filed Vitals:   04/13/15 0828  BP: 105/70  Pulse: 73  Temp: 98.5 F (36.9 C)  TempSrc: Oral  Height: 5' 3.25" (1.607 m)  Weight: 128 lb 12.8 oz (58.423 kg)  SpO2: 100%    Physical Exam  Constitutional: She is oriented to person, place, and time. She appears well-developed and well-nourished. No distress.  HENT:  Head: Normocephalic and atraumatic.  Right Ear: External ear normal.  Left Ear: External ear normal.  Nose: Nose normal.  Mouth/Throat: Oropharynx is clear and moist. No oropharyngeal exudate.  Eyes: Conjunctivae and EOM are normal. Pupils are equal, round, and reactive to light. Right eye exhibits no discharge. Left eye exhibits no discharge. No scleral icterus.  Neck: Normal range of motion. Neck supple.  Cardiovascular: Normal rate, regular rhythm and normal heart sounds.   No murmur heard. Pulmonary/Chest: Effort normal and breath sounds normal. No respiratory distress. She has no wheezes. She has no rales.  Abdominal: Soft. Bowel sounds are normal. She exhibits no distension. There is no tenderness. There is no rebound and no guarding.  Musculoskeletal: Normal range of motion. She exhibits edema (trace b/l LE (R>L)) and tenderness (right calf).  Neurological: She is alert and oriented to person, place, and time.  Normal 5/5 muscle strength of b/l LE. Normal sensation to light touch of b/l LE.  Skin: Skin is warm and dry. No rash noted. She is not diaphoretic. No erythema. No pallor.  Psychiatric: She has a normal mood and  affect. Her behavior is normal. Judgment and thought content normal.    Assessment & Plan:   Please see problem list for problem-based assessment and plan

## 2015-04-13 NOTE — Progress Notes (Signed)
[facility] Clinic Attending  Case discussed with Dr. Naaman Plummer at the time of the visit.  We reviewed the resident's history and exam and pertinent patient test results.  I agree with the assessment, diagnosis, and plan of care documented in the resident's note.

## 2015-04-14 LAB — VITAMIN D 25 HYDROXY (VIT D DEFICIENCY, FRACTURES): VIT D 25 HYDROXY: 34 ng/mL (ref 30–100)

## 2015-05-04 ENCOUNTER — Encounter: Payer: 59 | Admitting: Internal Medicine

## 2015-10-07 DIAGNOSIS — M25512 Pain in left shoulder: Secondary | ICD-10-CM | POA: Diagnosis not present

## 2015-11-18 DIAGNOSIS — M25512 Pain in left shoulder: Secondary | ICD-10-CM | POA: Diagnosis not present

## 2015-12-17 DIAGNOSIS — M25512 Pain in left shoulder: Secondary | ICD-10-CM | POA: Diagnosis not present

## 2016-05-01 ENCOUNTER — Telehealth: Payer: Self-pay | Admitting: Internal Medicine

## 2016-05-01 NOTE — Telephone Encounter (Signed)
APT. REMINDER CALL, PHONE OFF

## 2016-05-02 ENCOUNTER — Encounter: Payer: Self-pay | Admitting: Internal Medicine

## 2016-05-02 ENCOUNTER — Ambulatory Visit (INDEPENDENT_AMBULATORY_CARE_PROVIDER_SITE_OTHER): Payer: 59 | Admitting: Internal Medicine

## 2016-05-02 VITALS — BP 115/84 | HR 70 | Temp 98.1°F | Ht 63.2 in | Wt 120.2 lb

## 2016-05-02 DIAGNOSIS — F17200 Nicotine dependence, unspecified, uncomplicated: Secondary | ICD-10-CM

## 2016-05-02 DIAGNOSIS — F17210 Nicotine dependence, cigarettes, uncomplicated: Secondary | ICD-10-CM

## 2016-05-02 DIAGNOSIS — Z8639 Personal history of other endocrine, nutritional and metabolic disease: Secondary | ICD-10-CM | POA: Diagnosis not present

## 2016-05-02 DIAGNOSIS — Z862 Personal history of diseases of the blood and blood-forming organs and certain disorders involving the immune mechanism: Secondary | ICD-10-CM | POA: Diagnosis not present

## 2016-05-02 DIAGNOSIS — R634 Abnormal weight loss: Secondary | ICD-10-CM

## 2016-05-02 DIAGNOSIS — Z6821 Body mass index (BMI) 21.0-21.9, adult: Secondary | ICD-10-CM | POA: Diagnosis not present

## 2016-05-02 DIAGNOSIS — R630 Anorexia: Secondary | ICD-10-CM | POA: Diagnosis not present

## 2016-05-02 DIAGNOSIS — R7303 Prediabetes: Secondary | ICD-10-CM | POA: Diagnosis not present

## 2016-05-02 LAB — GLUCOSE, CAPILLARY: Glucose-Capillary: 87 mg/dL (ref 65–99)

## 2016-05-02 LAB — POCT GLYCOSYLATED HEMOGLOBIN (HGB A1C): Hemoglobin A1C: 5.4

## 2016-05-02 MED ORDER — BUPROPION HCL ER (SR) 150 MG PO TB12: 150.0000 mg | ORAL_TABLET | Freq: Two times a day (BID) | ORAL | 3 refills | Status: DC

## 2016-05-02 NOTE — Assessment & Plan Note (Signed)
-   Patient with a history of poor appetite and low BMIs, s/p extensive work up including negative HIV and negative malignancy work up, which had improved recently after smoking cessation and her BMI had normalized - She has now resumed smoking after recent life stressors and has lost 8 lbs - Talked to patient about smoking cessation and importance to her health. She expresses understanding - Will attempt to try smoking cessation and have her follow up

## 2016-05-02 NOTE — Assessment & Plan Note (Signed)
  Assessment: Progress toward smoking cessation:   deteriorated Barriers to progress toward smoking cessation:   life stressors Comments: Patient has expressed the will to quit now and wants to try medication to help  Plan: Instruction/counseling given:  I counseled patient on the dangers of tobacco use, advised patient to stop smoking, and reviewed strategies to maximize success. Educational resources provided:    Self management tools provided:    Medications to assist with smoking cessation:  Bupropion (Zyban) Patient agreed to the following self-care plans for smoking cessation:    Other plans: Patient to set quit date 1-2 weeks after stareting zyban and follow up with me in 3 months

## 2016-05-02 NOTE — Assessment & Plan Note (Signed)
-   A1C remains normal - Will check her BMP today - Would consider holding off on any further A1Cs given it is now in the normal range

## 2016-05-02 NOTE — Patient Instructions (Signed)
- It was a pleasure seeing you today - Please follow up in 3 months - Will start you on zyban for smoking cessation. Please set a quit date for 1-2 weeks after starting medication - Take 1 tablet once a day for 3 days and then increase to twice daily - Will check labs today  Bupropion sustained-release tablets (smoking cessation) What is this medicine? BUPROPION (byoo PROE pee on) is used to help people quit smoking. This medicine may be used for other purposes; ask your health care provider or pharmacist if you have questions. What should I tell my health care provider before I take this medicine? They need to know if you have any of these conditions: -an eating disorder, such as anorexia or bulimia -bipolar disorder or psychosis -diabetes or high blood sugar, treated with medication -glaucoma -head injury or brain tumor -heart disease, previous heart attack, or irregular heart beat -high blood pressure -kidney or liver disease -seizures -suicidal thoughts or a previous suicide attempt -Tourette's syndrome -weight loss -an unusual or allergic reaction to bupropion, other medicines, foods, dyes, or preservatives -breast-feeding -pregnant or trying to become pregnant How should I use this medicine? Take this medicine by mouth with a glass of water. Follow the directions on the prescription label. You can take it with or without food. If it upsets your stomach, take it with food. Do not cut, crush or chew this medicine. Take your medicine at regular intervals. If you take this medicine more than once a day, take your second dose at least 8 hours after you take your first dose. To limit difficulty in sleeping, avoid taking this medicine at bedtime. Do not take your medicine more often than directed. Do not stop taking this medicine suddenly except upon the advice of your doctor. Stopping this medicine too quickly may cause serious side effects. A special MedGuide will be given to you by the  pharmacist with each prescription and refill. Be sure to read this information carefully each time. Talk to your pediatrician regarding the use of this medicine in children. Special care may be needed. Overdosage: If you think you have taken too much of this medicine contact a poison control center or emergency room at once. NOTE: This medicine is only for you. Do not share this medicine with others. What if I miss a dose? If you miss a dose, skip the missed dose and take your next tablet at the regular time. There should be at least 8 hours between doses. Do not take double or extra doses. What may interact with this medicine? Do not take this medicine with any of the following medications: -linezolid -MAOIs like Azilect, Carbex, Eldepryl, Marplan, Nardil, and Parnate -methylene blue (injected into a vein) -other medicines that contain bupropion like Wellbutrin This medicine may also interact with the following medications: -alcohol -certain medicines for anxiety or sleep -certain medicines for blood pressure like metoprolol, propranolol -certain medicines for depression or psychotic disturbances -certain medicines for HIV or AIDS like efavirenz, lopinavir, nelfinavir, ritonavir -certain medicines for irregular heart beat like propafenone, flecainide -certain medicines for Parkinson's disease like amantadine, levodopa -certain medicines for seizures like carbamazepine, phenytoin, phenobarbital -cimetidine -clopidogrel -cyclophosphamide -furazolidone -isoniazid -nicotine -orphenadrine -procarbazine -steroid medicines like prednisone or cortisone -stimulant medicines for attention disorders, weight loss, or to stay awake -tamoxifen -theophylline -thiotepa -ticlopidine -tramadol -warfarin This list may not describe all possible interactions. Give your health care provider a list of all the medicines, herbs, non-prescription drugs, or dietary supplements you  use. Also tell them if  you smoke, drink alcohol, or use illegal drugs. Some items may interact with your medicine. What should I watch for while using this medicine? Visit your doctor or health care professional for regular checks on your progress. This medicine should be used together with a patient support program. It is important to participate in a behavioral program, counseling, or other support program that is recommended by your health care professional. Patients and their families should watch out for new or worsening thoughts of suicide or depression. Also watch out for sudden changes in feelings such as feeling anxious, agitated, panicky, irritable, hostile, aggressive, impulsive, severely restless, overly excited and hyperactive, or not being able to sleep. If this happens, especially at the beginning of treatment or after a change in dose, call your health care professional. Avoid alcoholic drinks while taking this medicine. Drinking excessive alcoholic beverages, using sleeping or anxiety medicines, or quickly stopping the use of these agents while taking this medicine may increase your risk for a seizure. Do not drive or use heavy machinery until you know how this medicine affects you. This medicine can impair your ability to perform these tasks. Do not take this medicine close to bedtime. It may prevent you from sleeping. Your mouth may get dry. Chewing sugarless gum or sucking hard candy, and drinking plenty of water may help. Contact your doctor if the problem does not go away or is severe. Do not use nicotine patches or chewing gum without the advice of your doctor or health care professional while taking this medicine. You may need to have your blood pressure taken regularly if your doctor recommends that you use both nicotine and this medicine together. What side effects may I notice from receiving this medicine? Side effects that you should report to your doctor or health care professional as soon as  possible: -allergic reactions like skin rash, itching or hives, swelling of the face, lips, or tongue -breathing problems -changes in vision -confusion -fast or irregular heartbeat -hallucinations -increased blood pressure -redness, blistering, peeling or loosening of the skin, including inside the mouth -seizures -suicidal thoughts or other mood changes -unusually weak or tired -vomiting Side effects that usually do not require medical attention (report to your doctor or health care professional if they continue or are bothersome): -change in sex drive or performance -constipation -headache -loss of appetite -nausea -tremors -weight loss This list may not describe all possible side effects. Call your doctor for medical advice about side effects. You may report side effects to FDA at 1-800-FDA-1088. Where should I keep my medicine? Keep out of the reach of children. Store at room temperature between 20 and 25 degrees C (68 and 77 degrees F). Protect from light. Keep container tightly closed. Throw away any unused medicine after the expiration date. NOTE: This sheet is a summary. It may not cover all possible information. If you have questions about this medicine, talk to your doctor, pharmacist, or health care provider.    2016, [employer]. (2013-05-16 10:55:10)

## 2016-05-02 NOTE — Progress Notes (Signed)
   Subjective:    Patient ID: Heather [name], female    DOB: 11-08-1970, 45 y.o.   MRN: [id number]  HPI  Patient seen and examined. She is here for routine follow up as well as to talk about smoking cessation Patient has had an issue with low BMIs and poor appetite but had improved recently after she had stopped smoking and her BMI had improved to almost 23.  She states there have been a lot of stressors recently and she has started smoking again. She has noticed worsening appetite and decreased weight to 120 lbs from 128 lbs and decreased BMI to 21. She has had an extensive work yp for weight loss including malignancy work up as well testing for HIV which have all been negative.   Review of Systems  Constitutional: Positive for appetite change. Negative for chills, fatigue and fever.  HENT: Negative.   Respiratory: Negative.   Cardiovascular: Negative.   Gastrointestinal: Negative.   Musculoskeletal: Negative.   Neurological: Negative.   Psychiatric/Behavioral: Negative.        Objective:   Physical Exam  Constitutional: She is oriented to person, place, and time. She appears well-developed and well-nourished.  HENT:  Head: Normocephalic and atraumatic.  Eyes: Conjunctivae are normal. Right eye exhibits no discharge. Left eye exhibits no discharge.  Neck: Normal range of motion. Neck supple.  Cardiovascular: Normal rate, regular rhythm and normal heart sounds.   Pulmonary/Chest: Effort normal and breath sounds normal. No respiratory distress. She has no wheezes.  Abdominal: Soft. Bowel sounds are normal. She exhibits no distension. There is no tenderness.  Musculoskeletal: Normal range of motion. She exhibits no edema or tenderness.  Lymphadenopathy:    She has no cervical adenopathy.  Neurological: She is alert and oriented to person, place, and time.  Skin: Skin is warm. No rash noted. No erythema.  Psychiatric: She has a normal mood and affect. Her behavior is normal.           Assessment & Plan:  Please see problem based charting for assessment and plan:

## 2016-05-02 NOTE — Assessment & Plan Note (Signed)
-   Patient with a history of anemia and now with weight loss and decreased appetite - She also complains of mild fatigue. This is likely secondary to underlying stress but will check CBC to rule out recurrent anemia

## 2016-05-03 LAB — CBC WITH DIFFERENTIAL/PLATELET
BASOS ABS: 0.1 10*3/uL (ref 0.0–0.2)
Basos: 1 %
EOS (ABSOLUTE): 0 10*3/uL (ref 0.0–0.4)
EOS: 0 %
HEMATOCRIT: 41.3 % (ref 34.0–46.6)
Hemoglobin: 13.8 g/dL (ref 11.1–15.9)
IMMATURE GRANULOCYTES: 0 %
Immature Grans (Abs): 0 10*3/uL (ref 0.0–0.1)
LYMPHS ABS: 1.4 10*3/uL (ref 0.7–3.1)
Lymphs: 26 %
MCH: 30 pg (ref 26.6–33.0)
MCHC: 33.4 g/dL (ref 31.5–35.7)
MCV: 90 fL (ref 79–97)
MONOS ABS: 0.3 10*3/uL (ref 0.1–0.9)
Monocytes: 5 %
NEUTROS PCT: 68 %
Neutrophils Absolute: 3.7 10*3/uL (ref 1.4–7.0)
Platelets: 155 10*3/uL (ref 150–379)
RBC: 4.6 x10E6/uL (ref 3.77–5.28)
RDW: 14.7 % (ref 12.3–15.4)
WBC: 5.4 10*3/uL (ref 3.4–10.8)

## 2016-05-03 LAB — BMP8+ANION GAP
Anion Gap: 18 mmol/L (ref 10.0–18.0)
BUN/Creatinine Ratio: 7 — ABNORMAL LOW (ref 9–23)
BUN: 5 mg/dL — AB (ref 6–24)
CALCIUM: 9.7 mg/dL (ref 8.7–10.2)
CO2: 24 mmol/L (ref 18–29)
Chloride: 103 mmol/L (ref 96–106)
Creatinine, Ser: 0.68 mg/dL (ref 0.57–1.00)
GFR calc Af Amer: 121 mL/min/{1.73_m2} (ref >59)
GFR calc non Af Amer: 105 mL/min/{1.73_m2} (ref >59)
GLUCOSE: 88 mg/dL (ref 65–99)
Potassium: 4.3 mmol/L (ref 3.5–5.2)
Sodium: 145 mmol/L — ABNORMAL HIGH (ref 134–144)

## 2016-05-04 ENCOUNTER — Telehealth: Payer: Self-pay | Admitting: Internal Medicine

## 2016-05-04 NOTE — Telephone Encounter (Signed)
She was calling about the lab work that was drawn here on 8/. Thanks

## 2016-05-04 NOTE — Telephone Encounter (Signed)
Her blood work from 8/1 looks good.  Her sodium was barely elevated and likely related to some mild dehydration from the recent heat wave.  Nothing further needs to be done about this as it will self correct.  Please call her with that information.  If she still would like to speak with Dr. Dareen Piano please leave him a note and he can do so when he returns in about 10 days.

## 2016-05-04 NOTE — Telephone Encounter (Signed)
Called pt gave her dr Heather [name] message and made her next appt w/ dr Heather [name] at end of sept

## 2016-05-04 NOTE — Telephone Encounter (Signed)
Patient is calling back to speak to dr Dareen Piano about some results.

## 2016-05-04 NOTE — Telephone Encounter (Signed)
Dr. Dareen Piano is on vacation.  What are the specific refills she would like addressed?

## 2016-06-26 ENCOUNTER — Telehealth: Payer: Self-pay | Admitting: Internal Medicine

## 2016-06-26 NOTE — Telephone Encounter (Signed)
APT. REMINDER CALL, LMTCB °

## 2016-06-27 ENCOUNTER — Ambulatory Visit (INDEPENDENT_AMBULATORY_CARE_PROVIDER_SITE_OTHER): Payer: 59 | Admitting: Internal Medicine

## 2016-06-27 ENCOUNTER — Encounter: Payer: Self-pay | Admitting: Internal Medicine

## 2016-06-27 VITALS — BP 108/79 | HR 73 | Temp 98.3°F | Ht 63.4 in | Wt 113.2 lb

## 2016-06-27 DIAGNOSIS — F17210 Nicotine dependence, cigarettes, uncomplicated: Secondary | ICD-10-CM | POA: Diagnosis not present

## 2016-06-27 DIAGNOSIS — R634 Abnormal weight loss: Secondary | ICD-10-CM

## 2016-06-27 DIAGNOSIS — Z681 Body mass index (BMI) 19 or less, adult: Secondary | ICD-10-CM | POA: Diagnosis not present

## 2016-06-27 DIAGNOSIS — F17200 Nicotine dependence, unspecified, uncomplicated: Secondary | ICD-10-CM

## 2016-06-27 DIAGNOSIS — Z862 Personal history of diseases of the blood and blood-forming organs and certain disorders involving the immune mechanism: Secondary | ICD-10-CM

## 2016-06-27 MED ORDER — BUPROPION HCL ER (SR) 150 MG PO TB12: 150.0000 mg | ORAL_TABLET | Freq: Two times a day (BID) | ORAL | 3 refills | Status: DC

## 2016-06-27 MED FILL — BUPROPION SR 150 MG TABLET: 150 | 30 days supply | Qty: 60 | Fill #0

## 2016-06-27 NOTE — Patient Instructions (Addendum)
- It was a pleasure seeing you today - We will prescribe bupropion to help with your smoking - Your weight has decreased since your last visit. Please follow up in 3 months so we can monitor this - Your lab work has been good. No further work up for now  Bupropion extended-release tablets (Depression/Mood Disorders) What is this medicine? BUPROPION (byoo PROE pee on) is used to treat depression. This medicine may be used for other purposes; ask your health care provider or pharmacist if you have questions. What should I tell my health care provider before I take this medicine? They need to know if you have any of these conditions: -an eating disorder, such as anorexia or bulimia -bipolar disorder or psychosis -diabetes or high blood sugar, treated with medication -glaucoma -head injury or brain tumor -heart disease, previous heart attack, or irregular heart beat -high blood pressure -kidney or liver disease -seizures (convulsions) -suicidal thoughts or a previous suicide attempt -Tourette's syndrome -weight loss -an unusual or allergic reaction to bupropion, other medicines, foods, dyes, or preservatives -breast-feeding -pregnant or trying to become pregnant How should I use this medicine? Take this medicine by mouth with a glass of water. Follow the directions on the prescription label. You can take it with or without food. If it upsets your stomach, take it with food. Do not crush, chew, or cut these tablets. This medicine is taken once daily at the same time each day. Do not take your medicine more often than directed. Do not stop taking this medicine suddenly except upon the advice of your doctor. Stopping this medicine too quickly may cause serious side effects or your condition may worsen. A special MedGuide will be given to you by the pharmacist with each prescription and refill. Be sure to read this information carefully each time. Talk to your pediatrician regarding the use of  this medicine in children. Special care may be needed. Overdosage: If you think you have taken too much of this medicine contact a poison control center or emergency room at once. NOTE: This medicine is only for you. Do not share this medicine with others. What if I miss a dose? If you miss a dose, skip the missed dose and take your next tablet at the regular time. Do not take double or extra doses. What may interact with this medicine? Do not take this medicine with any of the following medications: -linezolid -MAOIs like Azilect, Carbex, Eldepryl, Marplan, Nardil, and Parnate -methylene blue (injected into a vein) -other medicines that contain bupropion like Zyban This medicine may also interact with the following medications: -alcohol -certain medicines for anxiety or sleep -certain medicines for blood pressure like metoprolol, propranolol -certain medicines for depression or psychotic disturbances -certain medicines for HIV or AIDS like efavirenz, lopinavir, nelfinavir, ritonavir -certain medicines for irregular heart beat like propafenone, flecainide -certain medicines for Parkinson's disease like amantadine, levodopa -certain medicines for seizures like carbamazepine, phenytoin, phenobarbital -cimetidine -clopidogrel -cyclophosphamide -furazolidone -isoniazid -nicotine -orphenadrine -procarbazine -steroid medicines like prednisone or cortisone -stimulant medicines for attention disorders, weight loss, or to stay awake -tamoxifen -theophylline -thiotepa -ticlopidine -tramadol -warfarin This list may not describe all possible interactions. Give your health care provider a list of all the medicines, herbs, non-prescription drugs, or dietary supplements you use. Also tell them if you smoke, drink alcohol, or use illegal drugs. Some items may interact with your medicine. What should I watch for while using this medicine? Tell your doctor if your symptoms do not get better  or if  they get worse. Visit your doctor or health care professional for regular checks on your progress. Because it may take several weeks to see the full effects of this medicine, it is important to continue your treatment as prescribed by your doctor. Patients and their families should watch out for new or worsening thoughts of suicide or depression. Also watch out for sudden changes in feelings such as feeling anxious, agitated, panicky, irritable, hostile, aggressive, impulsive, severely restless, overly excited and hyperactive, or not being able to sleep. If this happens, especially at the beginning of treatment or after a change in dose, call your health care professional. Avoid alcoholic drinks while taking this medicine. Drinking large amounts of alcoholic beverages, using sleeping or anxiety medicines, or quickly stopping the use of these agents while taking this medicine may increase your risk for a seizure. Do not drive or use heavy machinery until you know how this medicine affects you. This medicine can impair your ability to perform these tasks. Do not take this medicine close to bedtime. It may prevent you from sleeping. Your mouth may get dry. Chewing sugarless gum or sucking hard candy, and drinking plenty of water may help. Contact your doctor if the problem does not go away or is severe. The tablet shell for some brands of this medicine does not dissolve. This is normal. The tablet shell may appear whole in the stool. This is not a cause for concern. What side effects may I notice from receiving this medicine? Side effects that you should report to your doctor or health care professional as soon as possible: -allergic reactions like skin rash, itching or hives, swelling of the face, lips, or tongue -breathing problems -changes in vision -confusion -fast or irregular heartbeat -hallucinations -increased blood pressure -redness, blistering, peeling or loosening of the skin, including  inside the mouth -seizures -suicidal thoughts or other mood changes -unusually weak or tired -vomiting Side effects that usually do not require medical attention (report to your doctor or health care professional if they continue or are bothersome): -change in sex drive or performance -constipation -headache -loss of appetite -nausea -tremors -weight loss This list may not describe all possible side effects. Call your doctor for medical advice about side effects. You may report side effects to FDA at 1-800-FDA-1088. Where should I keep my medicine? Keep out of the reach of children. Store at room temperature between 15 and 30 degrees C (59 and 86 degrees F). Throw away any unused medicine after the expiration date. NOTE: This sheet is a summary. It may not cover all possible information. If you have questions about this medicine, talk to your doctor, pharmacist, or health care provider.    2016, Elsevier/Gold Standard. (2013-04-11 12:39:42)

## 2016-06-27 NOTE — Progress Notes (Signed)
   Subjective:    Patient ID: Heather [name], female    DOB: June 13, 1971, 45 y.o.   MRN: [id number]  HPI  Patient seen and examined. She is here for routine follow up of her smoking cessation and weight loss.   Patient states that she had been doing well on the bupropion we had prescribed her on her prior visit but that she was told her refill would be 75 dollars so she didn't pick it up. She had noted more energy and that she did not need to smoke as much anymore while on it. Reported no side effects  Patient states her appetite has not been as good lately and that she has been under a lot of stress since her father recently died which she attributes as the cause fore her recent weight loss.  She has no other complaints at this time  Review of Systems  Constitutional: Positive for appetite change and unexpected weight change. Negative for activity change.  HENT: Negative.   Respiratory: Negative.  Negative for cough, chest tightness and wheezing.   Cardiovascular: Negative.   Gastrointestinal: Negative.  Negative for abdominal pain, diarrhea, nausea and vomiting.  Musculoskeletal: Negative.   Skin: Negative.   Neurological: Negative.   Psychiatric/Behavioral: Negative.        Objective:   Physical Exam  Constitutional: She is oriented to person, place, and time. She appears well-developed.  underweight  HENT:  Head: Normocephalic and atraumatic.  Neck: Neck supple.  Cardiovascular: Normal rate, regular rhythm and normal heart sounds.   Pulmonary/Chest: Effort normal and breath sounds normal. No respiratory distress.  Abdominal: Soft. Bowel sounds are normal. She exhibits no distension. There is no tenderness.  Musculoskeletal: Normal range of motion. She exhibits no edema or tenderness.  Lymphadenopathy:    She has no cervical adenopathy.  Neurological: She is alert and oriented to person, place, and time.  Skin: Skin is warm. No rash noted. No erythema.  Psychiatric: She  has a normal mood and affect. Her behavior is normal.          Assessment & Plan:  Please see problem based charting for assessment and plan:

## 2016-06-27 NOTE — Assessment & Plan Note (Signed)
-   CBC done on last visit was wnl - States her fatigue has improved - No further work up for now

## 2016-06-27 NOTE — Assessment & Plan Note (Signed)
-   Patient with a history of low BMIs and poor appetite and has had extensive work up which has been negative - She continues to lose weight which she attributes to recent life stressors including her father passing away recently - Will have her follow up in 3 months to monitor her weight - If she continues to have worsening weight loss will consider further work up

## 2016-06-27 NOTE — Assessment & Plan Note (Signed)
  Assessment: Progress toward smoking cessation:   improved Barriers to progress toward smoking cessation:   Recent life stressors, cost of medications Comments: Patient is now down to less than 1 PPD. Will resume bupropion. Discussed with pharmacy and is available for 4 dollars at [facility]: Instruction/counseling given:  I counseled patient on the dangers of tobacco use, advised patient to stop smoking, and reviewed strategies to maximize success. Educational resources provided:  [employer] account [employer]) brochure (denies need ) Self management tools provided:    Medications to assist with smoking cessation:  Bupropion (Zyban) Patient agreed to the following self-care plans for smoking cessation: cut down the number of cigarettes smoked  Other plans: Patient has a quit date in mind and will continue to use bupropion for now.

## 2016-08-02 ENCOUNTER — Other Ambulatory Visit: Payer: Self-pay

## 2016-08-02 NOTE — Telephone Encounter (Signed)
buPROPion (WELLBUTRIN SR) 150 MG , REFILL REQUEST

## 2016-08-03 MED FILL — BUPROPION SR 150 MG TABLET: 150 | 30 days supply | Qty: 60 | Fill #1

## 2016-08-03 NOTE — Telephone Encounter (Signed)
Pt stated the pharmacy told her, her doctor needs to call for refills. There should be a least 2 refills left. I called [facility] - stated she has 3 refills - will refill and have it ready for pt. Called pt back; informed of the above.

## 2016-09-19 ENCOUNTER — Ambulatory Visit: Payer: 59 | Admitting: Internal Medicine

## 2016-11-06 ENCOUNTER — Telehealth: Payer: Self-pay | Admitting: Internal Medicine

## 2016-11-06 NOTE — Telephone Encounter (Signed)
APT. REMINDER CALL, LMTCB °

## 2016-11-07 ENCOUNTER — Ambulatory Visit: Payer: 59 | Admitting: Internal Medicine

## 2017-01-01 ENCOUNTER — Telehealth: Payer: Self-pay | Admitting: Internal Medicine

## 2017-01-01 NOTE — Telephone Encounter (Signed)
APT. REMINDER CALL, LMTCB °

## 2017-01-02 ENCOUNTER — Ambulatory Visit (INDEPENDENT_AMBULATORY_CARE_PROVIDER_SITE_OTHER): Payer: 59 | Admitting: Internal Medicine

## 2017-01-02 VITALS — BP 111/70 | HR 84 | Temp 98.2°F | Wt 123.1 lb

## 2017-01-02 DIAGNOSIS — R1013 Epigastric pain: Secondary | ICD-10-CM

## 2017-01-02 DIAGNOSIS — Z90722 Acquired absence of ovaries, bilateral: Secondary | ICD-10-CM | POA: Diagnosis not present

## 2017-01-02 DIAGNOSIS — F17200 Nicotine dependence, unspecified, uncomplicated: Secondary | ICD-10-CM

## 2017-01-02 DIAGNOSIS — F17210 Nicotine dependence, cigarettes, uncomplicated: Secondary | ICD-10-CM

## 2017-01-02 DIAGNOSIS — R634 Abnormal weight loss: Secondary | ICD-10-CM | POA: Diagnosis not present

## 2017-01-02 DIAGNOSIS — R5383 Other fatigue: Secondary | ICD-10-CM | POA: Diagnosis not present

## 2017-01-02 DIAGNOSIS — Z862 Personal history of diseases of the blood and blood-forming organs and certain disorders involving the immune mechanism: Secondary | ICD-10-CM | POA: Diagnosis not present

## 2017-01-02 DIAGNOSIS — R6881 Early satiety: Secondary | ICD-10-CM | POA: Diagnosis not present

## 2017-01-02 DIAGNOSIS — Z8719 Personal history of other diseases of the digestive system: Secondary | ICD-10-CM

## 2017-01-02 DIAGNOSIS — Z90710 Acquired absence of both cervix and uterus: Secondary | ICD-10-CM | POA: Diagnosis not present

## 2017-01-02 DIAGNOSIS — R110 Nausea: Secondary | ICD-10-CM

## 2017-01-02 DIAGNOSIS — R109 Unspecified abdominal pain: Secondary | ICD-10-CM | POA: Insufficient documentation

## 2017-01-02 DIAGNOSIS — Z0000 Encounter for general adult medical examination without abnormal findings: Secondary | ICD-10-CM

## 2017-01-02 MED ORDER — OMEPRAZOLE 20 MG PO CPDR: 20.0000 mg | DELAYED_RELEASE_CAPSULE | Freq: Every day | ORAL | 0 refills | Status: DC

## 2017-01-02 MED ORDER — BUPROPION HCL ER (SR) 150 MG PO TB12: 150.0000 mg | ORAL_TABLET | Freq: Two times a day (BID) | ORAL | 3 refills | Status: DC

## 2017-01-02 MED FILL — OMEPRAZOLE DR 20 MG CAPSULE: 20 | 30 days supply | Qty: 30 | Fill #0

## 2017-01-02 MED FILL — BUPROPION SR 150 MG TABLET: 150 | 30 days supply | Qty: 60 | Fill #0

## 2017-01-02 NOTE — Assessment & Plan Note (Addendum)
-   Patient complains of epigastric pain over the last couple of months - dull, aching, constant assoc with nausea, early satiety, bloating and belching - Did have 1 episode 1 month ago which was severe, 10/10 epigastric pain which subsided in 10 min - Uncertain etiology of her pain- she was tender to palpation on abdominal exam with some guarding but no rebound - Will start PPI - had gastritis on her last EGD in 2015 and pain is epigastric - Given her associated symptoms of nausea, bloating and belching and her intermittent difficulty swallowing will refer her back to GI for follow up and get a CT abd/pelvis - Will not get contrast with the CT given her reaction to the contrast in the past which appears to be a true allergic reaction - Will check LFTs and BMP as well

## 2017-01-02 NOTE — Assessment & Plan Note (Signed)
-   She has a h/o TAH/BSO but was found to have pre cancerous cells on her cervix per notes from 2008 - Path not available in chart - Will need repeat pap smear. Guidelines state screening is necessary in patients with a TAH with a history of CIN 2/3. Since her path is not available would continue to screen for now - Patient would prefer a female physician for this. WiIl set up appointment in [facility] for pap

## 2017-01-02 NOTE — Patient Instructions (Addendum)
-   It was a pleasure seeing you today - Please follow up with me in 3 months - Please follow up for Pap smear in the next couple of weeks - I will start you on protonix for your abdominal pain - I will get a CT of your abdomen to look for sources of your pain - Will refer you back to Dr. Olevia Perches for follow up - I will restart your bupropion. Please set a quit date and try and stop smoking

## 2017-01-02 NOTE — Progress Notes (Signed)
   Subjective:    Patient ID: Heather [name], female    DOB: Nov 18, 1970, 46 y.o.   MRN: [id number]  HPI  I have seen and examined this patient. She is here for follow up of her weight loss and poor appetite. She states over the last couple of months she has noted persistent abdominal pain in her epigastric region - dull, non radiating, constant associated with bloating and belching. She also complains of difficulty swallowing intermittently. She denies fevers/chills, no CP, no sob, no palpitations. She does complain of intermittent nausea as well as soft BMs (not diarrhea but not solid). Approx 1 month ago she states she was out in the yard and her abd pain acutely worsened and became sharp, 10/10 and she had difficulty standing up. Pain subsided in 10-15 min but then improved and remained dull and constant. She states she is still smoking 1 pack every 2-3 days and forgot to refill her bupropion to help her to stop.  Review of Systems  Constitutional: Negative.  Negative for fatigue and fever.  HENT: Negative.   Respiratory: Negative.   Cardiovascular: Negative.   Gastrointestinal: Positive for abdominal distention, abdominal pain and nausea. Negative for vomiting.  Musculoskeletal: Negative.   Skin: Negative.   Neurological: Negative.   Psychiatric/Behavioral: Negative.        Objective:   Physical Exam  Constitutional: She is oriented to person, place, and time. She appears well-developed.  HENT:  Head: Normocephalic and atraumatic.  Neck: Normal range of motion. Neck supple.  Cardiovascular: Normal rate, regular rhythm and normal heart sounds.   Pulmonary/Chest: Effort normal and breath sounds normal. No respiratory distress. She has no wheezes.  Abdominal: Soft. Bowel sounds are normal. She exhibits no distension. There is tenderness. There is guarding. There is no rebound.  Patient with mild to moderate diffuse tenderness worse in the epigastric region   Musculoskeletal: Normal  range of motion. She exhibits no edema.  Lymphadenopathy:    She has no cervical adenopathy.  Neurological: She is alert and oriented to person, place, and time.  Skin: Skin is warm. No rash noted. No erythema.  Psychiatric: She has a normal mood and affect. Her behavior is normal.          Assessment & Plan:  Please see problem based charting for assessment and plan:

## 2017-01-02 NOTE — Assessment & Plan Note (Signed)
-   currently smoking 1 pack every 2-3 days - Refilled bupropion to help patient with smoking cessation - counseled patient on importance of smoking cessation - Patient is motivated to quit and will set a quit date and resume bupropion - Will follow up on next visit - Time spent on counseling was 5 min

## 2017-01-02 NOTE — Assessment & Plan Note (Signed)
-   Patient with a history of anemia and mildly worsening fatigue - Fatigue likely related to her poor appetite and GI symptoms but will check CBC for follow up today given history of anemia

## 2017-01-02 NOTE — Assessment & Plan Note (Signed)
-   Patient is up 10 lbs since her last appointment - States she is forcing herself to eat more but given her current GI symptoms (nausea, early satiety) this has baen hard - Will continue to monitor weight

## 2017-01-03 ENCOUNTER — Telehealth: Payer: Self-pay | Admitting: Internal Medicine

## 2017-01-03 LAB — HEPATIC FUNCTION PANEL
ALBUMIN: 4.9 g/dL (ref 3.5–5.5)
ALK PHOS: 63 IU/L (ref 39–117)
ALT: 10 IU/L (ref 0–32)
AST: 12 IU/L (ref 0–40)
BILIRUBIN TOTAL: 0.3 mg/dL (ref 0.0–1.2)
BILIRUBIN, DIRECT: 0.11 mg/dL (ref 0.00–0.40)
Total Protein: 7.1 g/dL (ref 6.0–8.5)

## 2017-01-03 LAB — CBC WITH DIFFERENTIAL/PLATELET
BASOS: 1 %
Basophils Absolute: 0.1 10*3/uL (ref 0.0–0.2)
EOS (ABSOLUTE): 0.1 10*3/uL (ref 0.0–0.4)
EOS: 1 %
Hematocrit: 40.3 % (ref 34.0–46.6)
Hemoglobin: 13.1 g/dL (ref 11.1–15.9)
IMMATURE GRANS (ABS): 0 10*3/uL (ref 0.0–0.1)
IMMATURE GRANULOCYTES: 0 %
Lymphocytes Absolute: 1.6 10*3/uL (ref 0.7–3.1)
Lymphs: 22 %
MCH: 30.5 pg (ref 26.6–33.0)
MCHC: 32.5 g/dL (ref 31.5–35.7)
MCV: 94 fL (ref 79–97)
MONOS ABS: 0.4 10*3/uL (ref 0.1–0.9)
Monocytes: 5 %
NEUTROS ABS: 5.2 10*3/uL (ref 1.4–7.0)
NEUTROS PCT: 71 %
Platelets: 159 10*3/uL (ref 150–379)
RBC: 4.3 x10E6/uL (ref 3.77–5.28)
RDW: 14 % (ref 12.3–15.4)
WBC: 7.3 10*3/uL (ref 3.4–10.8)

## 2017-01-03 LAB — BMP8+ANION GAP
ANION GAP: 16 mmol/L (ref 10.0–18.0)
BUN/Creatinine Ratio: 12 (ref 9–23)
BUN: 9 mg/dL (ref 6–24)
CO2: 25 mmol/L (ref 18–29)
CREATININE: 0.77 mg/dL (ref 0.57–1.00)
Calcium: 9.9 mg/dL (ref 8.7–10.2)
Chloride: 105 mmol/L (ref 96–106)
GFR calc Af Amer: 106 mL/min/{1.73_m2} (ref >59)
GFR calc non Af Amer: 92 mL/min/{1.73_m2} (ref >59)
Glucose: 92 mg/dL (ref 65–99)
POTASSIUM: 4.1 mmol/L (ref 3.5–5.2)
SODIUM: 146 mmol/L — AB (ref 134–144)

## 2017-01-03 NOTE — Telephone Encounter (Signed)
Called patient to discuss results of blood work. Explained to patient that blood work did not show any significant abnormality. She was noted to have a mildly elevated Na level which I attribute to her not taking as much PO as she should. Will follow up results of CT abd as well as GI referral. Patient expressed understanding and is in agreement with plan

## 2017-01-15 ENCOUNTER — Encounter: Payer: Self-pay | Admitting: Internal Medicine

## 2017-01-15 ENCOUNTER — Ambulatory Visit (INDEPENDENT_AMBULATORY_CARE_PROVIDER_SITE_OTHER): Payer: 59 | Admitting: Internal Medicine

## 2017-01-15 DIAGNOSIS — Z124 Encounter for screening for malignant neoplasm of cervix: Secondary | ICD-10-CM

## 2017-01-15 DIAGNOSIS — N941 Unspecified dyspareunia: Secondary | ICD-10-CM

## 2017-01-15 DIAGNOSIS — Z5329 Procedure and treatment not carried out because of patient's decision for other reasons: Secondary | ICD-10-CM | POA: Diagnosis not present

## 2017-01-15 NOTE — Progress Notes (Signed)
   CC: Pap smear   HPI:  [name] is a 46 y.o. woman with PMHx as noted below who presents today for a pap smear.   Patient reports she cannot remember the last time she had a Pap smear. She reports having abnormal Pap smears in the past. She reports recently having thin white vaginal discharge with occasional darker vaginal discharge. She also reports dyspareunia. She denies any itching, dysuria, or new sores present.   Past Medical History:  Diagnosis Date  . Blood dyscrasia   . COPD (chronic obstructive pulmonary disease) ([place])   . Current smoker   . Difficult intubation    pt was told difficult intubation at [facility]  . 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 Rapid resting heart rate    happens infrequently    Review of Systems:   General: Denies fever, chills, night sweats, changes in weight, changes in appetite HEENT: Denies headaches, ear pain, changes in vision, rhinorrhea, sore throat CV: Denies CP, palpitations, SOB, orthopnea Pulm: Denies SOB, cough, wheezing GI: Denies abdominal pain, nausea, vomiting, diarrhea, constipation, melena, hematochezia GU: Denies hematuria, frequency Msk: Denies muscle cramps, joint pains Neuro: Denies weakness, numbness, tingling Skin: Denies rashes, bruising Psych: Denies depression, anxiety, hallucinations  Physical Exam:  Vitals:   01/15/17 0857  BP: 101/61  Pulse: 76  Temp: 98.1 F (36.7 C)  TempSrc: Oral  SpO2: 100%  Weight: 126 lb 14.4 oz (57.6 kg)  Height: 5' 3.4" (1.61 m)   General: Well-nourished woman in NAD GU: External genitalia appear normal. Patient was unable to tolerate speculum so cervix could not be visualized.   Assessment & Plan:   See Encounters Tab for problem based charting.  Patient discussed with Dr. Lynnae January

## 2017-01-15 NOTE — Progress Notes (Signed)
Internal Medicine Clinic Attending  Case discussed with Dr. Rivet at the time of the visit.  We reviewed the resident's history and exam and pertinent patient test results.  I agree with the assessment, diagnosis, and plan of care documented in the resident's note.  

## 2017-01-15 NOTE — Patient Instructions (Signed)
General Instructions: - Feel free to make another appointment for a pap smear when you are ready  Please bring your medicines with you each time you come to clinic.  Medicines may include prescription medications, over-the-counter medications, herbal remedies, eye drops, vitamins, or other pills.   Progress Toward Treatment Goals:  No flowsheet data found.  Self Care Goals & Plans:  Self Care Goal 06/27/2016  Manage my medications bring my medications to every visit; refill my medications on time  Eat healthy foods drink diet soda or water instead of juice or soda; eat more vegetables; eat foods that are low in salt; eat baked foods instead of fried foods; eat fruit for snacks and desserts; eat smaller portions  Be physically active find an activity I enjoy  Stop smoking cut down the number of cigarettes smoked  Meeting treatment goals maintain the current self-care plan    No flowsheet data found.   Care Management & Community Referrals:  No flowsheet data found.

## 2017-01-15 NOTE — Assessment & Plan Note (Signed)
Two attempts were made for the pap smear but patient was unable to tolerate the speculum. She reported pain with insertion of the speculum and was unable to relax to complete the procedure. She was advised to make another appointment for a pap smear when she feels ready.

## 2017-01-18 ENCOUNTER — Ambulatory Visit (HOSPITAL_COMMUNITY): Payer: 59

## 2017-01-22 ENCOUNTER — Ambulatory Visit (HOSPITAL_COMMUNITY): Payer: 59

## 2017-01-30 ENCOUNTER — Ambulatory Visit (HOSPITAL_COMMUNITY)
Admission: RE | Admit: 2017-01-30 | Discharge: 2017-01-30 | Disposition: A | Discharge status: Home / Self Care | Payer: 59 | Source: Ambulatory Visit | Attending: Internal Medicine | Admitting: Internal Medicine

## 2017-01-30 DIAGNOSIS — I700 Atherosclerosis of aorta: Secondary | ICD-10-CM | POA: Diagnosis not present

## 2017-01-30 DIAGNOSIS — R1013 Epigastric pain: Secondary | ICD-10-CM | POA: Diagnosis not present

## 2017-01-30 DIAGNOSIS — K5730 Diverticulosis of large intestine without perforation or abscess without bleeding: Secondary | ICD-10-CM | POA: Diagnosis not present

## 2017-02-06 ENCOUNTER — Ambulatory Visit: Payer: 59 | Admitting: Gastroenterology

## 2017-02-15 MED FILL — BUPROPION SR 150 MG TABLET: 150 | 30 days supply | Qty: 60 | Fill #1

## 2017-03-15 ENCOUNTER — Ambulatory Visit: Payer: 59 | Admitting: Gastroenterology

## 2017-03-16 MED FILL — BUPROPION SR 150 MG TABLET: 150 | 30 days supply | Qty: 60 | Fill #2

## 2017-04-12 NOTE — Addendum Note (Signed)
Addended by: Hulan Fray on: 04/12/2017 02:20 PM   Modules accepted: Orders

## 2017-07-04 ENCOUNTER — Ambulatory Visit (HOSPITAL_COMMUNITY)
Admission: RE | Admit: 2017-07-04 | Discharge: 2017-07-04 | Disposition: A | Discharge status: Home / Self Care | Payer: 59 | Source: Ambulatory Visit | Attending: Internal Medicine | Admitting: Internal Medicine

## 2017-07-04 ENCOUNTER — Ambulatory Visit (INDEPENDENT_AMBULATORY_CARE_PROVIDER_SITE_OTHER): Payer: 59 | Admitting: Internal Medicine

## 2017-07-04 VITALS — BP 113/66 | HR 84 | Wt 100.9 lb

## 2017-07-04 DIAGNOSIS — R634 Abnormal weight loss: Secondary | ICD-10-CM | POA: Diagnosis not present

## 2017-07-04 DIAGNOSIS — R590 Localized enlarged lymph nodes: Secondary | ICD-10-CM

## 2017-07-04 DIAGNOSIS — R911 Solitary pulmonary nodule: Secondary | ICD-10-CM

## 2017-07-04 DIAGNOSIS — J449 Chronic obstructive pulmonary disease, unspecified: Secondary | ICD-10-CM | POA: Insufficient documentation

## 2017-07-04 DIAGNOSIS — F17210 Nicotine dependence, cigarettes, uncomplicated: Secondary | ICD-10-CM | POA: Insufficient documentation

## 2017-07-04 DIAGNOSIS — I700 Atherosclerosis of aorta: Secondary | ICD-10-CM | POA: Diagnosis not present

## 2017-07-04 NOTE — Progress Notes (Signed)
   CC: Weight loss  HPI:  [name] is a 46 y.o. female with past medical history outlined below here for weight loss follow up. For the details of today's visit, please refer to the assessment and plan.  Past Medical History:  Diagnosis Date  . Blood dyscrasia   . COPD (chronic obstructive pulmonary disease) ([place])   . Current smoker   . Difficult intubation    pt was told difficult intubation at [facility]  . 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 Rapid resting heart rate    happens infrequently    Review of Systems  Constitutional: Positive for malaise/fatigue and weight loss. Negative for fever.  Respiratory: Negative for cough.   Gastrointestinal: Negative for abdominal pain, blood in stool, constipation and diarrhea.    Physical Exam:  Vitals:   07/04/17 1052  BP: 113/66  Pulse: 84  SpO2: 100%  Weight: 100 lb 14.4 oz (45.8 kg)    Constitutional: Thin, cachetic appearing woman  HEENT: Normal oropharynx  Neck: Bilateral cervical adenopathy  Cardiovascular: RRR, no murmurs, rubs, or gallops.  Pulmonary/Chest: CTAB, no wheezes, rales, or rhonchi.   Abdominal: Soft, non tender, non distended. +BS.  Extremities: Warm and well perfused. Trace edema.  Psychiatric: Normal mood and affect  Assessment & Plan:   See Encounters Tab for problem based charting.  Patient seen with Dr. Angelia Mould

## 2017-07-04 NOTE — Assessment & Plan Note (Signed)
Bilateral, slightly tender to palpation, and mobile. Patient reports mild URI a few weeks ago that has resolved. In the setting of her weight loss, adenopathy could be concerning for malignancy. However patient has undergone extensive work up over the past 3 years including CT chest, CT abdomen/pelvis, colonoscopy, upper endoscopy, and mammogram that were all negative. We are planning for additional work up today (see weight loss A&P). Will monitor adenopathy for now. If persistent or enlarging, plan for imaging and/ or biopsy at follow up in 1 month.  -- Monitor for now  -- Consider ultrasound and / or biopsy at follow up if persistent

## 2017-07-04 NOTE — Assessment & Plan Note (Addendum)
Patient is here with complaint of weight loss. This is an ongoing issue and patient has previously undergone extensive workup. On chart review, it appears that her weight has fluctuated between low 100 - 128 lbs over the past 4 years. Patient was close to her current weight of 100 pounds back in 2014. It appears she gained weight in 2016 and was up to 128 lbs. Last weight in our system was recorded on 01/15/2017 at 126 pounds. Today she is back down to 100 pounds. She reports ongoing issues of poor appetite and early satiety. She underwent colonoscopy and EGD in 2015 notable for 5 sessile polyps that resulted benign and mild gastritis with rare H. pylori on endoscopy. She received treatment at that time for H. pylori infection. She also had a screening mammogram read as BI-RADS Category 1. Patient is a chronic smoker 30+ years. Currently smokes 1 pack per week but previously up to 2 packs a day. CT chest with contrast in 2015 was negative for malignancy, notable only for slight emphysema. She endorses rare alcohol use and denies illicit substance use. She is status post complete hysterectomy due to abnormal Pap smears. Of note, she reports a strong family history of malignancy notable for metastatic lung cancer in her father (now deceased), maternal grandmother with stomach cancer, paternal grandfather with unknown cancer, and multiple aunts and uncles with malignancy. Thorough review of systems today is negative aside from weight loss, fatigue, and bilateral cervical adenopathy noted on exam. Plan to repeat lab workup with some additional testing today. Will obtain H. pylori stool antigen to confirm eradication. Although her cervical adenopathy is concerning in the setting of her weight loss, it is bilateral and not fixed. Will obtain chest xray and mammogram and plan for close follow up of adenopathy. If persistent or enlarged, will obtain additional testing and / or biopsy at follow up in 1 month.  -- CBC,  CMP -- HIV ab -- TSH -- ESR, CRP -- UA -- CXR 2 view -- Stool antigen for H pylori  -- Mammogram  -- F/u 1 month, if adenopathy has persisted or enlarged, consider ultrasound or biopsy.   ADDENDUM: New soft tissue density nodule in the right upper lobe concerning for malignancy. Lab work up negative aside from stool H pylori antigen which is pending. Called patient with results, ordered CT chest w/ contrast for follow up.   ADDENDUM: Stool H Pylori antigen negative. Called patient with results. CT chest scheduled 07/12/17.   ADDENDUM: CT chest was completed 38/45/36 without complication (pre-medicated due to contrast allergy). Results revealed a 1.4 cm nodule in the posterior segment of the right upper lobe, new since prior CT in 2015. Called patient and discussed options for follow up. Given her family history and smoking history, patient has opted for biopsy which I feel is appropriate. Order placed for IR consult. Will follow up recommendations.

## 2017-07-04 NOTE — Patient Instructions (Signed)
Heather [name],  It was a pleasure to see you today. I will call you with the results of your blood work today. I have also ordered a mammogram and a chest xray. I will call you with the results. Please follow up with [place] in 1 month. If you have any questions or concerns, call our clinic at [phone] or after hours call [phone] and ask for the internal medicine resident on call. Thank you!  - Dr. Philipp Ovens

## 2017-07-04 NOTE — Progress Notes (Signed)
Internal Medicine Clinic Attending  I saw and evaluated the patient.  I personally confirmed the key portions of the history and exam documented by Dr. Guilloud and I reviewed pertinent patient test results.  The assessment, diagnosis, and plan were formulated together and I agree with the documentation in the resident's note.  

## 2017-07-05 ENCOUNTER — Other Ambulatory Visit: Payer: 59

## 2017-07-05 DIAGNOSIS — R634 Abnormal weight loss: Secondary | ICD-10-CM | POA: Diagnosis not present

## 2017-07-05 LAB — CBC WITH DIFFERENTIAL/PLATELET
BASOS ABS: 0 10*3/uL (ref 0.0–0.2)
Basos: 1 %
EOS (ABSOLUTE): 0.1 10*3/uL (ref 0.0–0.4)
Eos: 1 %
HEMOGLOBIN: 13.1 g/dL (ref 11.1–15.9)
Hematocrit: 39.7 % (ref 34.0–46.6)
Immature Grans (Abs): 0 10*3/uL (ref 0.0–0.1)
Immature Granulocytes: 0 %
LYMPHS ABS: 1.4 10*3/uL (ref 0.7–3.1)
Lymphs: 22 %
MCH: 30.8 pg (ref 26.6–33.0)
MCHC: 33 g/dL (ref 31.5–35.7)
MCV: 93 fL (ref 79–97)
MONOCYTES: 6 %
MONOS ABS: 0.4 10*3/uL (ref 0.1–0.9)
Neutrophils Absolute: 4.4 10*3/uL (ref 1.4–7.0)
Neutrophils: 70 %
Platelets: 140 10*3/uL — ABNORMAL LOW (ref 150–379)
RBC: 4.26 x10E6/uL (ref 3.77–5.28)
RDW: 13.8 % (ref 12.3–15.4)
WBC: 6.2 10*3/uL (ref 3.4–10.8)

## 2017-07-05 LAB — URINALYSIS, ROUTINE W REFLEX MICROSCOPIC
Bilirubin, UA: NEGATIVE
GLUCOSE, UA: NEGATIVE
KETONES UA: NEGATIVE
LEUKOCYTES UA: NEGATIVE
Nitrite, UA: NEGATIVE
Protein, UA: NEGATIVE
RBC, UA: NEGATIVE
SPEC GRAV UA: 1.019 (ref 1.005–1.030)
Urobilinogen, Ur: 1 mg/dL (ref 0.2–1.0)
pH, UA: 7 (ref 5.0–7.5)

## 2017-07-05 LAB — CMP14 + ANION GAP
A/G RATIO: 2.3 — AB (ref 1.2–2.2)
ALBUMIN: 4.9 g/dL (ref 3.5–5.5)
ALK PHOS: 50 IU/L (ref 39–117)
ALT: 9 IU/L (ref 0–32)
AST: 14 IU/L (ref 0–40)
Anion Gap: 19 mmol/L — ABNORMAL HIGH (ref 10.0–18.0)
BILIRUBIN TOTAL: 0.3 mg/dL (ref 0.0–1.2)
BUN / CREAT RATIO: 13 (ref 9–23)
BUN: 11 mg/dL (ref 6–24)
CHLORIDE: 103 mmol/L (ref 96–106)
CO2: 23 mmol/L (ref 20–29)
Calcium: 9.7 mg/dL (ref 8.7–10.2)
Creatinine, Ser: 0.86 mg/dL (ref 0.57–1.00)
GFR calc non Af Amer: 81 mL/min/{1.73_m2} (ref >59)
GFR, EST AFRICAN AMERICAN: 93 mL/min/{1.73_m2} (ref >59)
GLOBULIN, TOTAL: 2.1 g/dL (ref 1.5–4.5)
Glucose: 92 mg/dL (ref 65–99)
POTASSIUM: 4 mmol/L (ref 3.5–5.2)
SODIUM: 145 mmol/L — AB (ref 134–144)
TOTAL PROTEIN: 7 g/dL (ref 6.0–8.5)

## 2017-07-05 LAB — TSH: TSH: 1.42 u[IU]/mL (ref 0.450–4.500)

## 2017-07-05 LAB — SEDIMENTATION RATE: Sed Rate: 2 mm/hr (ref 0–32)

## 2017-07-05 LAB — C-REACTIVE PROTEIN: CRP: 0.4 mg/L (ref 0.0–4.9)

## 2017-07-05 LAB — HIV ANTIBODY (ROUTINE TESTING W REFLEX): HIV SCREEN 4TH GENERATION: NONREACTIVE

## 2017-07-05 NOTE — Addendum Note (Signed)
Addended by: Jodean Lima on: 07/05/2017 08:54 AM   Modules accepted: Orders

## 2017-07-07 LAB — H. PYLORI ANTIGEN, STOOL: H PYLORI AG STL: NEGATIVE

## 2017-07-12 ENCOUNTER — Ambulatory Visit (HOSPITAL_COMMUNITY): Admission: RE | Admit: 2017-07-12 | Payer: 59 | Source: Ambulatory Visit

## 2017-07-12 ENCOUNTER — Telehealth: Payer: Self-pay | Admitting: *Deleted

## 2017-07-12 MED ORDER — PREDNISONE 50 MG PO TABS: 50.0000 mg | ORAL_TABLET | ORAL | 0 refills | Status: DC

## 2017-07-12 MED ORDER — DIPHENHYDRAMINE HCL 25 MG PO TABS: 50.0000 mg | ORAL_TABLET | ORAL | 0 refills | Status: DC

## 2017-07-12 MED FILL — predniSONE 50 MG TABS: 50 | 1 days supply | Qty: 3 | Fill #0

## 2017-07-12 NOTE — Telephone Encounter (Signed)
Prescription for Prednisone 50 mg and Benadryl 50 mg sent to [facility]. Instructions to take prednisone 13 hours prior, 7 hours prior, and 1 hour prior to CT. 50 mg Benadryl to be taken 1 hour prior to CT per radiology protocol.

## 2017-07-12 NOTE — Telephone Encounter (Signed)
Thank you for filling this Hoyle Sauer

## 2017-07-12 NOTE — Telephone Encounter (Signed)
Pt calls and states she does not have the premedication orders for her CT, needs the prednisone called in, radiology has protocol to go by,  Also got a call from [facility] in [place] she states the times for meds below, please order these and [facility], pt will pick up today and began premed at 0130:                                                                        50mg  prednisone: 13hrs, 7 hrs, 1 hr  50mg  benedryl 1 hr  10/12 1400 test, confirmed time w/ pt, any questions call kim at [facility]

## 2017-07-13 ENCOUNTER — Ambulatory Visit (HOSPITAL_COMMUNITY)
Admission: RE | Admit: 2017-07-13 | Discharge: 2017-07-13 | Disposition: A | Discharge status: Home / Self Care | Payer: 59 | Source: Ambulatory Visit | Attending: Internal Medicine | Admitting: Internal Medicine

## 2017-07-13 DIAGNOSIS — R634 Abnormal weight loss: Secondary | ICD-10-CM | POA: Diagnosis not present

## 2017-07-13 DIAGNOSIS — R590 Localized enlarged lymph nodes: Secondary | ICD-10-CM | POA: Insufficient documentation

## 2017-07-13 DIAGNOSIS — I700 Atherosclerosis of aorta: Secondary | ICD-10-CM | POA: Insufficient documentation

## 2017-07-13 DIAGNOSIS — Z681 Body mass index (BMI) 19 or less, adult: Secondary | ICD-10-CM | POA: Diagnosis not present

## 2017-07-13 DIAGNOSIS — J439 Emphysema, unspecified: Secondary | ICD-10-CM | POA: Diagnosis not present

## 2017-07-13 DIAGNOSIS — R911 Solitary pulmonary nodule: Secondary | ICD-10-CM | POA: Diagnosis not present

## 2017-07-13 MED ORDER — IOPAMIDOL (ISOVUE-300) INJECTION 61%
INTRAVENOUS | Status: AC
  Administered 2017-07-13: 75 mL
  Filled 2017-07-13: qty 100

## 2017-07-16 DIAGNOSIS — R911 Solitary pulmonary nodule: Secondary | ICD-10-CM | POA: Insufficient documentation

## 2017-07-16 HISTORY — DX: Solitary pulmonary nodule: R91.1

## 2017-07-16 NOTE — Addendum Note (Signed)
Addended by: Jodean Lima on: 07/16/2017 09:30 AM   Modules accepted: Orders

## 2017-07-18 ENCOUNTER — Other Ambulatory Visit: Payer: Self-pay | Admitting: Internal Medicine

## 2017-07-18 ENCOUNTER — Ambulatory Visit (INDEPENDENT_AMBULATORY_CARE_PROVIDER_SITE_OTHER): Payer: 59 | Admitting: *Deleted

## 2017-07-18 DIAGNOSIS — Z23 Encounter for immunization: Secondary | ICD-10-CM

## 2017-07-18 DIAGNOSIS — R911 Solitary pulmonary nodule: Secondary | ICD-10-CM

## 2017-07-18 NOTE — Addendum Note (Signed)
Addended by: Larey Dresser A on: 07/18/2017 09:19 AM   Modules accepted: Orders

## 2017-07-19 ENCOUNTER — Telehealth: Payer: Self-pay | Admitting: *Deleted

## 2017-07-19 DIAGNOSIS — R911 Solitary pulmonary nodule: Secondary | ICD-10-CM

## 2017-07-19 NOTE — Telephone Encounter (Signed)
Oncology Nurse Navigator Documentation  Oncology Nurse Navigator Flowsheets 07/19/2017  Navigator Location [facility]  Referral date to RadOnc/MedOnc 07/18/2017  Navigator Encounter Type Telephone/I received referral on Heather [name] yesterday. I updated Dr. Julien Nordmann on referral.  I called patient today and schedule her to be seen with Dr. Julien Nordmann on 07/31/17.  She verbalized understanding of appt time and place.   Telephone Outgoing Call  Treatment Phase Abnormal Scans  Barriers/Navigation Needs Coordination of Care  Interventions Coordination of Care  Coordination of Care Appts  Acuity Level 2  Time Spent with Patient 30

## 2017-07-31 ENCOUNTER — Other Ambulatory Visit (HOSPITAL_BASED_OUTPATIENT_CLINIC_OR_DEPARTMENT_OTHER): Payer: 59

## 2017-07-31 ENCOUNTER — Ambulatory Visit (HOSPITAL_BASED_OUTPATIENT_CLINIC_OR_DEPARTMENT_OTHER): Payer: 59 | Admitting: Internal Medicine

## 2017-07-31 ENCOUNTER — Telehealth: Payer: Self-pay

## 2017-07-31 ENCOUNTER — Encounter: Payer: Self-pay | Admitting: Internal Medicine

## 2017-07-31 VITALS — BP 105/54 | HR 71 | Temp 97.5°F | Resp 20 | Ht 63.4 in | Wt 99.5 lb

## 2017-07-31 DIAGNOSIS — J449 Chronic obstructive pulmonary disease, unspecified: Secondary | ICD-10-CM | POA: Diagnosis not present

## 2017-07-31 DIAGNOSIS — R05 Cough: Secondary | ICD-10-CM

## 2017-07-31 DIAGNOSIS — R1310 Dysphagia, unspecified: Secondary | ICD-10-CM | POA: Diagnosis not present

## 2017-07-31 DIAGNOSIS — R1013 Epigastric pain: Secondary | ICD-10-CM | POA: Diagnosis not present

## 2017-07-31 DIAGNOSIS — Z808 Family history of malignant neoplasm of other organs or systems: Secondary | ICD-10-CM

## 2017-07-31 DIAGNOSIS — R911 Solitary pulmonary nodule: Secondary | ICD-10-CM | POA: Diagnosis not present

## 2017-07-31 DIAGNOSIS — R5383 Other fatigue: Secondary | ICD-10-CM

## 2017-07-31 DIAGNOSIS — R110 Nausea: Secondary | ICD-10-CM

## 2017-07-31 DIAGNOSIS — D862 Sarcoidosis of lung with sarcoidosis of lymph nodes: Secondary | ICD-10-CM | POA: Diagnosis not present

## 2017-07-31 DIAGNOSIS — C3490 Malignant neoplasm of unspecified part of unspecified bronchus or lung: Secondary | ICD-10-CM

## 2017-07-31 DIAGNOSIS — Z801 Family history of malignant neoplasm of trachea, bronchus and lung: Secondary | ICD-10-CM

## 2017-07-31 DIAGNOSIS — Z720 Tobacco use: Secondary | ICD-10-CM

## 2017-07-31 DIAGNOSIS — R634 Abnormal weight loss: Secondary | ICD-10-CM

## 2017-07-31 DIAGNOSIS — Z90710 Acquired absence of both cervix and uterus: Secondary | ICD-10-CM | POA: Diagnosis not present

## 2017-07-31 DIAGNOSIS — R0609 Other forms of dyspnea: Secondary | ICD-10-CM | POA: Diagnosis not present

## 2017-07-31 LAB — CBC WITH DIFFERENTIAL/PLATELET
BASO%: 1.1 % (ref 0.0–2.0)
Basophils Absolute: 0.1 10*3/uL (ref 0.0–0.1)
EOS%: 0.8 % (ref 0.0–7.0)
Eosinophils Absolute: 0.1 10*3/uL (ref 0.0–0.5)
HCT: 42.6 % (ref 34.8–46.6)
HGB: 14.2 g/dL (ref 11.6–15.9)
LYMPH%: 21.4 % (ref 14.0–49.7)
MCH: 31.5 pg (ref 25.1–34.0)
MCHC: 33.3 g/dL (ref 31.5–36.0)
MCV: 94.6 fL (ref 79.5–101.0)
MONO#: 0.3 10*3/uL (ref 0.1–0.9)
MONO%: 4.1 % (ref 0.0–14.0)
NEUT#: 5 10*3/uL (ref 1.5–6.5)
NEUT%: 72.6 % (ref 38.4–76.8)
PLATELETS: 123 10*3/uL — AB (ref 145–400)
RBC: 4.5 10*6/uL (ref 3.70–5.45)
RDW: 13.5 % (ref 11.2–14.5)
WBC: 6.9 10*3/uL (ref 3.9–10.3)
lymph#: 1.5 10*3/uL (ref 0.9–3.3)

## 2017-07-31 LAB — COMPREHENSIVE METABOLIC PANEL
ALBUMIN: 5 g/dL (ref 3.5–5.0)
ALK PHOS: 50 U/L (ref 40–150)
ALT: 9 U/L (ref 0–55)
AST: 12 U/L (ref 5–34)
Anion Gap: 13 mEq/L — ABNORMAL HIGH (ref 3–11)
BILIRUBIN TOTAL: 0.69 mg/dL (ref 0.20–1.20)
BUN: 5.9 mg/dL — ABNORMAL LOW (ref 7.0–26.0)
CO2: 23 mEq/L (ref 22–29)
CREATININE: 0.8 mg/dL (ref 0.6–1.1)
Calcium: 9.9 mg/dL (ref 8.4–10.4)
Chloride: 106 mEq/L (ref 98–109)
GLUCOSE: 103 mg/dL (ref 70–140)
Potassium: 3.6 mEq/L (ref 3.5–5.1)
SODIUM: 141 meq/L (ref 136–145)
TOTAL PROTEIN: 7.9 g/dL (ref 6.4–8.3)

## 2017-07-31 NOTE — Patient Instructions (Signed)
Steps to Quit Smoking Smoking tobacco can be bad for your health. It can also affect almost every organ in your body. Smoking puts you and people around you at risk for many serious long-lasting (chronic) diseases. Quitting smoking is hard, but it is one of the best things that you can do for your health. It is never too late to quit. What are the benefits of quitting smoking? When you quit smoking, you lower your risk for getting serious diseases and conditions. They can include:  Lung cancer or lung disease.  Heart disease.  Stroke.  Heart attack.  Not being able to have children (infertility).  Weak bones (osteoporosis) and broken bones (fractures).  If you have coughing, wheezing, and shortness of breath, those symptoms may get better when you quit. You may also get sick less often. If you are pregnant, quitting smoking can help to lower your chances of having a baby of low birth weight. What can I do to help me quit smoking? Talk with your doctor about what can help you quit smoking. Some things you can do (strategies) include:  Quitting smoking totally, instead of slowly cutting back how much you smoke over a period of time.  Going to in-person counseling. You are more likely to quit if you go to many counseling sessions.  Using resources and support systems, such as: ? Online chats with a counselor. ? Phone quitlines. ? Printed self-help materials. ? Support groups or group counseling. ? Text messaging programs. ? Mobile phone apps or applications.  Taking medicines. Some of these medicines may have nicotine in them. If you are pregnant or breastfeeding, do not take any medicines to quit smoking unless your doctor says it is okay. Talk with your doctor about counseling or other things that can help you.  Talk with your doctor about using more than one strategy at the same time, such as taking medicines while you are also going to in-person counseling. This can help make  quitting easier. What things can I do to make it easier to quit? Quitting smoking might feel very hard at first, but there is a lot that you can do to make it easier. Take these steps:  Talk to your family and friends. Ask them to support and encourage you.  Call phone quitlines, reach out to support groups, or work with a counselor.  Ask people who smoke to not smoke around you.  Avoid places that make you want (trigger) to smoke, such as: ? Bars. ? Parties. ? Smoke-break areas at work.  Spend time with people who do not smoke.  Lower the stress in your life. Stress can make you want to smoke. Try these things to help your stress: ? Getting regular exercise. ? Deep-breathing exercises. ? Yoga. ? Meditating. ? Doing a body scan. To do this, close your eyes, focus on one area of your body at a time from head to toe, and notice which parts of your body are tense. Try to relax the muscles in those areas.  Download or buy apps on your mobile phone or tablet that can help you stick to your quit plan. There are many free apps, such as QuitGuide from the CDC (Centers for Disease Control and Prevention). You can find more support from smokefree.gov and other websites.  This information is not intended to replace advice given to you by your health care provider. Make sure you discuss any questions you have with your health care provider. Document Released: 07/15/2009 Document   Revised: 05/16/2016 Document Reviewed: 02/02/2015 Elsevier Interactive Patient Education  2018 Elsevier Inc.  

## 2017-07-31 NOTE — Telephone Encounter (Signed)
Printed avs and calender for upcoming appointment. Per 10/30 los

## 2017-07-31 NOTE — Progress Notes (Signed)
[facility] Telephone:([phone]) [phone]   Fax:([phone]) [phone]  CONSULT NOTE  REFERRING PHYSICIAN: Dr. Aldine Contes  REASON FOR CONSULTATION:  46 years old white female with pulmonary nodules suspicious for lung cancer.  HPI Heather [name] is a 46 y.o. female with past medical history significant for COPD, factor V deficiency, tachycardia as well as remote history of cervical cancer status post total abdominal hysterectomy.  The patient also has a long history for smoking.  She has been complaining of increasing weight loss over the last 6 months as well as cough and shortness of breath.  The chest x-ray was performed on July 05, 2017 and that showed an approximately 0.8 x 1.0 cm soft tissue density nodule in the right upper lobe which is new.  This was followed by CT scan of the chest on July 13, 2017 and that showed an nodule in the posterior segment of the right upper lobe measuring 1.4 x 0.7 cm.  This was suspicious for primary lung malignancy. The patient was referred to me today for further evaluation and recommendation regarding her condition. When seen today she is complaining of increasing fatigue and weakness.  She lost around 26 pounds in the last few months.  She continues to have shortness of breath with exertion and cough which sometimes dry and sometimes productive.  She also has nausea but no vomiting she is complaining of upper esophageal dysphagia as well as epigastric pain.  The patient denied having any diarrhea or constipation.  She has intermittent sharp headache with sensation down to her jaw.  She has no visual changes. Family history significant for mother with lung cancer at age 31, father had lung cancer at age 67. The patient is single and has 2 sons.  She works at [employer]. She has a history of smoking up to 2 pack/day for around 34 years and unfortunately she continues to smoke.  She has no history of alcohol or drug  abuse.  HPI  Past Medical History:  Diagnosis Date  . Blood dyscrasia   . COPD (chronic obstructive pulmonary disease) ([place])   . Current smoker   . Difficult intubation    pt was told difficult intubation at [facility]  . 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 Rapid resting heart rate    happens infrequently    Past Surgical History:  Procedure Laterality Date  . ABDOMINAL HYSTERECTOMY    . CESAREAN SECTION     x 2  . COLONOSCOPY N/A 05/15/2014   Procedure: COLONOSCOPY;  Surgeon: Lafayette Dragon, MD;  Location: [facility];  Service: Endoscopy;  Laterality: N/A;  . ESOPHAGOGASTRODUODENOSCOPY N/A 05/15/2014   Procedure: ESOPHAGOGASTRODUODENOSCOPY (EGD);  Surgeon: Lafayette Dragon, MD;  Location: [facility];  Service: Endoscopy;  Laterality: N/A;  . LAPAROSCOPY  07/07/2011   Procedure: LAPAROSCOPY DIAGNOSTIC;  Surgeon: Elveria Royals;  Location: [facility];  Service: Gynecology;  Laterality: N/A;  . LESION REMOVAL  07/07/2011   Procedure: EXCISION VAGINAL LESION;  Surgeon: Elveria Royals;  Location: [facility];  Service: Gynecology;  Laterality: N/A;    History reviewed. No pertinent family history.  Social History Social History  Substance Use Topics  . Smoking status: Current Every Day Smoker    Packs/day: 0.75    Years: 28.00    Types: Cigarettes  . Smokeless tobacco: Never Used     Comment: Cannot afford Chantex.  Cutting back 3/4 pk  . Alcohol  use 0.0 oz/week     Comment: socially once a month - mixed drinks    Allergies  Allergen Reactions  . Iohexol Swelling    Updated to actual drug allergy  . Aspirin Nausea And Vomiting    Current Outpatient Prescriptions  Medication Sig Dispense Refill  . buPROPion (WELLBUTRIN SR) 150 MG 12 hr tablet Take 1 tablet (150 mg total) by mouth 2 (two) times daily. Start with 1 tablet daily x 3 days. IM program (Patient not taking: Reported on 07/31/2017) 60 tablet 3  . diphenhydrAMINE (BENADRYL) 25 MG tablet Take 2  tablets (50 mg total) by mouth See admin instructions. Take 2 tablets (50 mg) 1 hour prior to CT. (Patient not taking: Reported on 07/31/2017) 10 tablet 0  . omeprazole (PRILOSEC) 20 MG capsule Take 1 capsule (20 mg total) by mouth daily. (Patient not taking: Reported on 07/31/2017) 30 capsule 0  . predniSONE (DELTASONE) 50 MG tablet Take 1 tablet (50 mg total) by mouth See admin instructions. One tab 13 hours prior to CT, one tab 7 hours prior, and one tab 1 hour prior (Patient not taking: Reported on 07/31/2017) 3 tablet 0   No current facility-administered medications for this visit.     Review of Systems  Constitutional: positive for fatigue and weight loss Eyes: negative Ears, nose, mouth, throat, and face: negative Respiratory: positive for cough and dyspnea on exertion Cardiovascular: negative Gastrointestinal: positive for dysphagia and nausea Genitourinary:negative Integument/breast: negative Hematologic/lymphatic: negative Musculoskeletal:negative Neurological: positive for headaches Behavioral/Psych: negative Endocrine: negative Allergic/Immunologic: negative  Physical Exam  YTK:ZSWFU, healthy, no distress, well developed, anxious and malnourished SKIN: skin color, texture, turgor are normal, no rashes or significant lesions HEAD: Normocephalic, No masses, lesions, tenderness or abnormalities EYES: normal, PERRLA, Conjunctiva are pink and non-injected EARS: External ears normal, Canals clear OROPHARYNX:no exudate, no erythema and lips, buccal mucosa, and tongue normal  NECK: supple, no adenopathy, no JVD LYMPH:  no palpable lymphadenopathy, no hepatosplenomegaly BREAST:not examined LUNGS: clear to auscultation , and palpation HEART: regular rate & rhythm, no murmurs and no gallops ABDOMEN:abdomen soft, non-tender, normal bowel sounds and no masses or organomegaly BACK: No CVA tenderness, Range of motion is normal EXTREMITIES:no joint deformities, effusion, or  inflammation, no edema, no skin discoloration  NEURO: alert & oriented x 3 with fluent speech, no focal motor/sensory deficits  PERFORMANCE STATUS: ECOG 1  LABORATORY DATA: Lab Results  Component Value Date   WBC 6.9 07/31/2017   HGB 14.2 07/31/2017   HCT 42.6 07/31/2017   MCV 94.6 07/31/2017   PLT 123 (L) 07/31/2017      Chemistry      Component Value Date/Time   NA 141 07/31/2017 1054   K 3.6 07/31/2017 1054   CL 103 07/04/2017 1139   CO2 23 07/31/2017 1054   BUN 5.9 (L) 07/31/2017 1054   CREATININE 0.8 07/31/2017 1054      Component Value Date/Time   CALCIUM 9.9 07/31/2017 1054   ALKPHOS 50 07/31/2017 1054   AST 12 07/31/2017 1054   ALT 9 07/31/2017 1054   BILITOT 0.69 07/31/2017 1054       RADIOGRAPHIC STUDIES: Dg Chest 2 View  Result Date: 07/05/2017 CLINICAL DATA:  20 pound unexplained weight loss over the past 6 months. History of COPD, heavy smoker. EXAM: CHEST  2 VIEW COMPARISON:  Chest x-ray of February 7th 2014 and CT scan of the chest of April 06, 2014 FINDINGS: The lungs remain hyperinflated with mild hemidiaphragm flattening. There is an  approximately 8 x 10 mm soft tissue density nodule in the right upper lobe which is new. There is no alveolar infiltrate or pleural effusion. The heart and mediastinal structures are unremarkable. There is calcification in the wall of the aortic arch. The bony thorax is unremarkable. IMPRESSION: COPD. New soft tissue density nodule in the right upper lobe. Chest CT scanning is recommended in an effort to exclude malignancy. Thoracic aortic atherosclerosis. Electronically Signed   By: David  Martinique M.D.   On: 07/05/2017 08:14   Ct Chest W Contrast  Result Date: 07/13/2017 CLINICAL DATA:  Solitary pulmonary nodule EXAM: CT CHEST WITH CONTRAST TECHNIQUE: Multidetector CT imaging of the chest was performed during intravenous contrast administration. CONTRAST:  75 mL Isovue 300 COMPARISON:  Chest CT 04/06/2014 FINDINGS:  Cardiovascular: Heart size is normal. Normal appearance of the aorta and central pulmonary arteries. Conventional 3 vessel aortic arch branching pattern. Minimal calcific aortic atherosclerosis. Mediastinum/Nodes: No mediastinal, hilar, axillary or subpectoral lymph nodes. The course of the thoracic esophagus is normal. The visualized thyroid gland is normal. Lungs/Pleura: Mild emphysematous change. There is biapical scarring. In the posterior segment of the right upper lobe, there is a nodule that measures 14 x 7 mm. This was not present on the chest CT of 04/06/2014. No pleural effusion. No other pulmonary nodules or masses. Upper Abdomen: No acute abnormality. Musculoskeletal: No chest wall abnormality. No acute or significant osseous findings. IMPRESSION: 1. Posterior segment right upper lobe pulmonary nodule measuring up to 14 mm. Consider one of the following in 3 months for both low-risk and high-risk individuals: (a) repeat chest CT, (b) follow-up PET-CT, or (c) tissue sampling. This recommendation follows the consensus statement: Guidelines for Management of Incidental Pulmonary Nodules Detected on CT Images: From the Fleischner Society 2017; Radiology 2017; 284:228-243. 2. Aortic Atherosclerosis (ICD10-I70.0) and mild Emphysema (ICD10-J43.9). Electronically Signed   By: Ulyses Jarred M.D.   On: 07/13/2017 16:09    ASSESSMENT: This is a very pleasant 46 years old white female with a right upper lobe pulmonary nodule and persistent weight loss as well as dysphagia and epigastric pain.  These findings are concerning for occult malignancy either from lung cancer or other malignancy.   PLAN: I had a lengthy discussion with the patient today about her current condition and further investigation to rule out underlying malignancy.  I personally and independently reviewed her CT scan images and discuss the result and showed the images to the patient today. I recommended for the patient to have a PET scan  performed within the next 1-2 weeks for further evaluation of the right upper lobe pulmonary nodule and also to rule out any other underlying malignancy. If the PET scan is suspicious for malignancy, I would recommend the patient to have a tissue diagnosis either with surgical resection of the solitary pulmonary nodule or CT-guided core biopsy if she has any other evidence of metastatic disease. I will arrange for the patient to come back for follow-up visit in 2 weeks for evaluation. She was advised to call immediately if she has any other concerning symptoms in the interval. The patient voices understanding of current disease status and treatment options and is in agreement with the current care plan. All questions were answered. The patient knows to call the clinic with any problems, questions or concerns. We can certainly see the patient much sooner if necessary.  Thank you so much for allowing me to participate in the care of Heather [name]. I will continue to  follow up the patient with you and assist in her care.  I spent 40 minutes counseling the patient face to face. The total time spent in the appointment was 60 minutes.  Disclaimer: This note was dictated with voice recognition software. Similar sounding words can inadvertently be transcribed and may not be corrected upon review.   Heather [name] July 31, 2017, 11:44 AM

## 2017-08-01 ENCOUNTER — Ambulatory Visit: Payer: 59

## 2017-08-02 ENCOUNTER — Telehealth: Payer: Self-pay | Admitting: *Deleted

## 2017-08-02 NOTE — Telephone Encounter (Signed)
Oncology Nurse Navigator Documentation  Oncology Nurse Navigator Flowsheets 08/02/2017  Navigator Location [facility]  Navigator Encounter Type Telephone/I called to follow up with Heather [name].  She is having her PET scan on 11/8 in am and she will be seen at [facility] that afternoon.  She verbalized understanding of appt time and place.   Telephone Outgoing Call  Treatment Phase Abnormal Scans  Barriers/Navigation Needs Coordination of Care  Interventions Coordination of Care  Coordination of Care Appts  Acuity Level 2  Acuity Level 2 Assistance expediting appointments  Time Spent with Patient 30

## 2017-08-09 ENCOUNTER — Other Ambulatory Visit: Payer: Self-pay

## 2017-08-09 ENCOUNTER — Ambulatory Visit (HOSPITAL_COMMUNITY)
Admission: RE | Admit: 2017-08-09 | Discharge: 2017-08-09 | Disposition: A | Discharge status: Home / Self Care | Payer: 59 | Source: Ambulatory Visit | Attending: Internal Medicine | Admitting: Internal Medicine

## 2017-08-09 ENCOUNTER — Institutional Professional Consult (permissible substitution) (INDEPENDENT_AMBULATORY_CARE_PROVIDER_SITE_OTHER): Payer: 59 | Admitting: Thoracic Surgery (Cardiothoracic Vascular Surgery)

## 2017-08-09 ENCOUNTER — Encounter: Payer: Self-pay | Admitting: Thoracic Surgery (Cardiothoracic Vascular Surgery)

## 2017-08-09 ENCOUNTER — Other Ambulatory Visit: Payer: Self-pay | Admitting: *Deleted

## 2017-08-09 ENCOUNTER — Ambulatory Visit: Payer: 59 | Attending: Thoracic Surgery (Cardiothoracic Vascular Surgery) | Admitting: Physical Therapy

## 2017-08-09 VITALS — BP 114/54 | HR 87 | Temp 98.5°F | Wt 99.7 lb

## 2017-08-09 DIAGNOSIS — I700 Atherosclerosis of aorta: Secondary | ICD-10-CM | POA: Diagnosis not present

## 2017-08-09 DIAGNOSIS — Z483 Aftercare following surgery for neoplasm: Secondary | ICD-10-CM | POA: Diagnosis not present

## 2017-08-09 DIAGNOSIS — R911 Solitary pulmonary nodule: Secondary | ICD-10-CM | POA: Insufficient documentation

## 2017-08-09 DIAGNOSIS — R262 Difficulty in walking, not elsewhere classified: Secondary | ICD-10-CM | POA: Insufficient documentation

## 2017-08-09 DIAGNOSIS — R634 Abnormal weight loss: Secondary | ICD-10-CM | POA: Diagnosis not present

## 2017-08-09 DIAGNOSIS — R293 Abnormal posture: Secondary | ICD-10-CM | POA: Diagnosis not present

## 2017-08-09 DIAGNOSIS — C3490 Malignant neoplasm of unspecified part of unspecified bronchus or lung: Secondary | ICD-10-CM | POA: Diagnosis not present

## 2017-08-09 DIAGNOSIS — J439 Emphysema, unspecified: Secondary | ICD-10-CM | POA: Insufficient documentation

## 2017-08-09 LAB — GLUCOSE, CAPILLARY: GLUCOSE-CAPILLARY: 96 mg/dL (ref 65–99)

## 2017-08-09 MED ORDER — FLUDEOXYGLUCOSE F - 18 (FDG) INJECTION
4.9000 | Freq: Once | INTRAVENOUS | Status: AC | PRN
  Administered 2017-08-09: 4.9 via INTRAVENOUS

## 2017-08-09 NOTE — H&P (View-Only) (Signed)
PCP is Aldine Contes, MD Referring Provider is Curt Bears, MD  No chief complaint on file.   HPI: 46 yo woman with a history of tobacco abuse (2 ppd until recently and now 3/4 ppd) who is sent for consultation re: a right upper lobe lung nodule. Her past history is significant for Factor V Leyden deficiency, COPD, anemia and difficult intubation. Over the past 6 months she has had a poor appetite and early satiety. She has lost about 26 pounds, 1/2 of that in the last 3 months. She felt some "nodules" in her neck and went to Dr. Philipp Ovens. She ordered multiple tests to evaluate her weight loss. A CXR showed a RUL nodule. A CT chest confirmed a 1 cm nodule in the right upper lobe. A PET this AM showed the nodule was hypermetabolic with an SUV of 6.4.  She has had multiple family members with lung cancer  She gets short of breath if she walks a long distance at a rapid pace but does not have shortness of breath with routine activities. No headaches or visual changes. + cough with clear mucous, no hemoptysis. No wheezing.  Zubrod Score: At the time of surgery this patient's most appropriate activity status/level should be described as: [x]     0    Normal activity, no symptoms []     1    Restricted in physical strenuous activity but ambulatory, able to do out light work []     2    Ambulatory and capable of self care, unable to do work activities, up and about >50 % of waking hours                              []     3    Only limited self care, in bed greater than 50% of waking hours []     4    Completely disabled, no self care, confined to bed or chair []     5    Moribund  Past Medical History:  Diagnosis Date  . Blood dyscrasia   . COPD (chronic obstructive pulmonary disease) ([place])   . Current smoker   . Difficult intubation    pt was told difficult intubation at [facility]  . Factor V deficiency ([place])    protein deficiency  . Rapid resting heart rate    happens infrequently     Past Surgical History:  Procedure Laterality Date  . ABDOMINAL HYSTERECTOMY    . CESAREAN SECTION     x 2    History reviewed. No pertinent family history.  Social History Social History   Tobacco Use  . Smoking status: Current Every Day Smoker    Packs/day: 0.75    Years: 28.00    Pack years: 21.00    Types: Cigarettes  . Smokeless tobacco: Never Used  . Tobacco comment: Cannot afford Chantex.  Cutting back 3/4 pk  Substance Use Topics  . Alcohol use: Yes    Alcohol/week: 0.0 oz    Comment: socially once a month - mixed drinks  . Drug use: No    Current Outpatient Medications  Medication Sig Dispense Refill  . buPROPion (WELLBUTRIN SR) 150 MG 12 hr tablet Take 1 tablet (150 mg total) by mouth 2 (two) times daily. Start with 1 tablet daily x 3 days. IM program (Patient not taking: Reported on 07/31/2017) 60 tablet 3  . diphenhydrAMINE (BENADRYL) 25 MG tablet Take 2 tablets (50 mg  total) by mouth See admin instructions. Take 2 tablets (50 mg) 1 hour prior to CT. (Patient not taking: Reported on 07/31/2017) 10 tablet 0  . omeprazole (PRILOSEC) 20 MG capsule Take 1 capsule (20 mg total) by mouth daily. (Patient not taking: Reported on 07/31/2017) 30 capsule 0  . predniSONE (DELTASONE) 50 MG tablet Take 1 tablet (50 mg total) by mouth See admin instructions. One tab 13 hours prior to CT, one tab 7 hours prior, and one tab 1 hour prior (Patient not taking: Reported on 07/31/2017) 3 tablet 0   No current facility-administered medications for this visit.     Allergies  Allergen Reactions  . Iohexol Swelling    Updated to actual drug allergy  . Aspirin Nausea And Vomiting    Review of Systems  Constitutional: Positive for appetite change, fatigue and unexpected weight change. Negative for activity change.  HENT: Negative for trouble swallowing and voice change.   Eyes: Negative for visual disturbance.  Respiratory: Positive for cough and shortness of breath. Negative for  wheezing.   Cardiovascular: Positive for palpitations (occassional). Negative for chest pain and leg swelling.  Gastrointestinal: Positive for nausea.       Early satiety  Musculoskeletal: Negative for arthralgias and myalgias.  Neurological: Negative for syncope, weakness and light-headedness.  Hematological: Negative for adenopathy. Does not bruise/bleed easily.  All other systems reviewed and are negative.   BP (!) 114/54   Pulse 87   Temp 98.5 F (36.9 C)   Wt 99 lb 11.2 oz (45.2 kg)   SpO2 100%   BMI 17.44 kg/m  Physical Exam  Constitutional: She is oriented to person, place, and time. She appears well-developed. No distress.  Thin  HENT:  Head: Normocephalic and atraumatic.  Mouth/Throat: No oropharyngeal exudate.  Eyes: Conjunctivae and EOM are normal. No scleral icterus.  Neck: Neck supple. No thyromegaly present.  Cardiovascular: Normal rate, regular rhythm, normal heart sounds and intact distal pulses. Exam reveals no gallop and no friction rub.  No murmur heard. Pulmonary/Chest: Effort normal and breath sounds normal. No respiratory distress. She has no wheezes. She has no rales.  Abdominal: Soft. She exhibits no distension. There is no tenderness.  Musculoskeletal: She exhibits no edema.  Lymphadenopathy:    She has no cervical adenopathy.  Neurological: She is alert and oriented to person, place, and time. No cranial nerve deficit. She exhibits normal muscle tone.  Skin: Skin is warm and dry.  Vitals reviewed.    Diagnostic Tests: CT CHEST WITH CONTRAST  TECHNIQUE: Multidetector CT imaging of the chest was performed during intravenous contrast administration.  CONTRAST:  75 mL Isovue 300  COMPARISON:  Chest CT 04/06/2014  FINDINGS: Cardiovascular: Heart size is normal. Normal appearance of the aorta and central pulmonary arteries. Conventional 3 vessel aortic arch branching pattern. Minimal calcific aortic atherosclerosis.  Mediastinum/Nodes:  No mediastinal, hilar, axillary or subpectoral lymph nodes. The course of the thoracic esophagus is normal. The visualized thyroid gland is normal.  Lungs/Pleura: Mild emphysematous change. There is biapical scarring. In the posterior segment of the right upper lobe, there is a nodule that measures 14 x 7 mm. This was not present on the chest CT of 04/06/2014. No pleural effusion. No other pulmonary nodules or masses.  Upper Abdomen: No acute abnormality.  Musculoskeletal: No chest wall abnormality. No acute or significant osseous findings.  IMPRESSION: 1. Posterior segment right upper lobe pulmonary nodule measuring up to 14 mm. Consider one of the following in 3 months for  both low-risk and high-risk individuals: (a) repeat chest CT, (b) follow-up PET-CT, or (c) tissue sampling. This recommendation follows the consensus statement: Guidelines for Management of Incidental Pulmonary Nodules Detected on CT Images: From the Fleischner Society 2017; Radiology 2017; 284:228-243. 2. Aortic Atherosclerosis (ICD10-I70.0) and mild Emphysema (ICD10-J43.9).   Electronically Signed   By: Ulyses Jarred M.D.   On: 07/13/2017 16:09NUCLEAR MEDICINE PET SKULL BASE TO THIGH  TECHNIQUE: 4.9 mCi F-18 FDG was injected intravenously. Full-ring PET imaging was performed from the skull base to thigh after the radiotracer. CT data was obtained and used for attenuation correction and anatomic localization.  FASTING BLOOD GLUCOSE:  Value: 96 mg/dl  COMPARISON:  Chest CT 07/14/2015.  FINDINGS: NECK: No hypermetabolic lymph nodes in the neck.  CHEST: Right upper lobe pulmonary nodule (image 23 series 8 today) is hypermetabolic with SUV max = 6.4. No hypermetabolic metastatic disease evident in the mediastinum or hilar regions. No other hypermetabolic lung nodules.  Centrilobular emphysema noted bilaterally with biapical pleural-parenchymal scarring.  ABDOMEN/PELVIS: No abnormal  hypermetabolic activity within the liver, pancreas, adrenal glands, or spleen. No hypermetabolic lymph nodes in the abdomen or pelvis.  Linear calcification in the spleen likely related to prior trauma or infection. Atherosclerotic calcification noted in the wall of the abdominal aorta 11 mm hyperattenuating lesion lower pole left kidney again noted as seen on prior CT imaging. No evidence for hypermetabolism in this lesion on PET imaging and features likely related to cyst complicated by proteinaceous debris or hemorrhage. Diverticular changes noted left colon without diverticulitis.  SKELETON: No focal hypermetabolic activity to suggest skeletal metastasis.  IMPRESSION: 1. Hypermetabolic 14 mm pulmonary nodule right upper lobe consistent with neoplasm. No evidence for distant hypermetabolic disease in the neck, chest, abdomen, or pelvis. 2.  Emphysema. (ICD10-J43.9) 3.  Aortic Atherosclerois (ICD10-170.0)   Electronically Signed   By: Misty Stanley M.D.   On: 08/09/2017 13:21 I personally reviewed the CT and PET images and concur with the findings noted above  Impression: 46 yo woman with a 60 pack year history of smoking who was incidentally found to have a RUL nodule while being worked up for weight loss. The nodule is markedly hypermetabolic on PET and is highly suspicious for a new primary bronchogenic carcinoma. Granulomatous disease is also in the differential but this has to be considered a lung cancer until proven otherwise.  We discussed options for diagnosis and treatment including CT biopsy v wedge and surgery v radiation. WE discussed the pros and cons of each.   My recommendation and the consensus of the [place] group is to proceed directly to surgery for wedge resection to confirm diagnosis followed by lobectomy if the frozen shows cancer. I discussed the general nature of the procedure, the need for general anesthesia, the incisions to be used, and the use of a  drainage tube with Ms Sivertsen. We discussed the expected hospital stay, overall recovery and short and long term outcomes. I reviewed the indications, risks, benefits and alternatives with her. She understands the risks include, but are not limited to death, MI, DVT/PE, bleeding, possible need for transfusion, infections, irregular heart rhythms, prolonged air leaks, as well as other organ system dysfunction including respiratory, renal, or GI complications.   She accepts the risks and agrees to proceed.  Needs PFTs prior to surgery  Plan: PFTs Right VATS, wedge resection, possible lobectomy on Monday 08/20/2017  [name], MD [facility] [phone]

## 2017-08-09 NOTE — Progress Notes (Signed)
PCP is Aldine Contes, MD Referring Provider is Curt Bears, MD  No chief complaint on file.   HPI: 46 yo woman with a history of tobacco abuse (2 ppd until recently and now 3/4 ppd) who is sent for consultation re: a right upper lobe lung nodule. Her past history is significant for Factor V Leyden deficiency, COPD, anemia and difficult intubation. Over the past 6 months she has had a poor appetite and early satiety. She has lost about 26 pounds, 1/2 of that in the last 3 months. She felt some "nodules" in her neck and went to Dr. Philipp Ovens. She ordered multiple tests to evaluate her weight loss. A CXR showed a RUL nodule. A CT chest confirmed a 1 cm nodule in the right upper lobe. A PET this AM showed the nodule was hypermetabolic with an SUV of 6.4.  She has had multiple family members with lung cancer  She gets short of breath if she walks a long distance at a rapid pace but does not have shortness of breath with routine activities. No headaches or visual changes. + cough with clear mucous, no hemoptysis. No wheezing.  Zubrod Score: At the time of surgery this patient's most appropriate activity status/level should be described as: [x]     0    Normal activity, no symptoms []     1    Restricted in physical strenuous activity but ambulatory, able to do out light work []     2    Ambulatory and capable of self care, unable to do work activities, up and about >50 % of waking hours                              []     3    Only limited self care, in bed greater than 50% of waking hours []     4    Completely disabled, no self care, confined to bed or chair []     5    Moribund  Past Medical History:  Diagnosis Date  . Blood dyscrasia   . COPD (chronic obstructive pulmonary disease) ([place])   . Current smoker   . Difficult intubation    pt was told difficult intubation at [facility]  . Factor V deficiency ([place])    protein deficiency  . Rapid resting heart rate    happens infrequently     Past Surgical History:  Procedure Laterality Date  . ABDOMINAL HYSTERECTOMY    . CESAREAN SECTION     x 2    History reviewed. No pertinent family history.  Social History Social History   Tobacco Use  . Smoking status: Current Every Day Smoker    Packs/day: 0.75    Years: 28.00    Pack years: 21.00    Types: Cigarettes  . Smokeless tobacco: Never Used  . Tobacco comment: Cannot afford Chantex.  Cutting back 3/4 pk  Substance Use Topics  . Alcohol use: Yes    Alcohol/week: 0.0 oz    Comment: socially once a month - mixed drinks  . Drug use: No    Current Outpatient Medications  Medication Sig Dispense Refill  . buPROPion (WELLBUTRIN SR) 150 MG 12 hr tablet Take 1 tablet (150 mg total) by mouth 2 (two) times daily. Start with 1 tablet daily x 3 days. IM program (Patient not taking: Reported on 07/31/2017) 60 tablet 3  . diphenhydrAMINE (BENADRYL) 25 MG tablet Take 2 tablets (50 mg  total) by mouth See admin instructions. Take 2 tablets (50 mg) 1 hour prior to CT. (Patient not taking: Reported on 07/31/2017) 10 tablet 0  . omeprazole (PRILOSEC) 20 MG capsule Take 1 capsule (20 mg total) by mouth daily. (Patient not taking: Reported on 07/31/2017) 30 capsule 0  . predniSONE (DELTASONE) 50 MG tablet Take 1 tablet (50 mg total) by mouth See admin instructions. One tab 13 hours prior to CT, one tab 7 hours prior, and one tab 1 hour prior (Patient not taking: Reported on 07/31/2017) 3 tablet 0   No current facility-administered medications for this visit.     Allergies  Allergen Reactions  . Iohexol Swelling    Updated to actual drug allergy  . Aspirin Nausea And Vomiting    Review of Systems  Constitutional: Positive for appetite change, fatigue and unexpected weight change. Negative for activity change.  HENT: Negative for trouble swallowing and voice change.   Eyes: Negative for visual disturbance.  Respiratory: Positive for cough and shortness of breath. Negative for  wheezing.   Cardiovascular: Positive for palpitations (occassional). Negative for chest pain and leg swelling.  Gastrointestinal: Positive for nausea.       Early satiety  Musculoskeletal: Negative for arthralgias and myalgias.  Neurological: Negative for syncope, weakness and light-headedness.  Hematological: Negative for adenopathy. Does not bruise/bleed easily.  All other systems reviewed and are negative.   BP (!) 114/54   Pulse 87   Temp 98.5 F (36.9 C)   Wt 99 lb 11.2 oz (45.2 kg)   SpO2 100%   BMI 17.44 kg/m  Physical Exam  Constitutional: She is oriented to person, place, and time. She appears well-developed. No distress.  Thin  HENT:  Head: Normocephalic and atraumatic.  Mouth/Throat: No oropharyngeal exudate.  Eyes: Conjunctivae and EOM are normal. No scleral icterus.  Neck: Neck supple. No thyromegaly present.  Cardiovascular: Normal rate, regular rhythm, normal heart sounds and intact distal pulses. Exam reveals no gallop and no friction rub.  No murmur heard. Pulmonary/Chest: Effort normal and breath sounds normal. No respiratory distress. She has no wheezes. She has no rales.  Abdominal: Soft. She exhibits no distension. There is no tenderness.  Musculoskeletal: She exhibits no edema.  Lymphadenopathy:    She has no cervical adenopathy.  Neurological: She is alert and oriented to person, place, and time. No cranial nerve deficit. She exhibits normal muscle tone.  Skin: Skin is warm and dry.  Vitals reviewed.    Diagnostic Tests: CT CHEST WITH CONTRAST  TECHNIQUE: Multidetector CT imaging of the chest was performed during intravenous contrast administration.  CONTRAST:  75 mL Isovue 300  COMPARISON:  Chest CT 04/06/2014  FINDINGS: Cardiovascular: Heart size is normal. Normal appearance of the aorta and central pulmonary arteries. Conventional 3 vessel aortic arch branching pattern. Minimal calcific aortic atherosclerosis.  Mediastinum/Nodes:  No mediastinal, hilar, axillary or subpectoral lymph nodes. The course of the thoracic esophagus is normal. The visualized thyroid gland is normal.  Lungs/Pleura: Mild emphysematous change. There is biapical scarring. In the posterior segment of the right upper lobe, there is a nodule that measures 14 x 7 mm. This was not present on the chest CT of 04/06/2014. No pleural effusion. No other pulmonary nodules or masses.  Upper Abdomen: No acute abnormality.  Musculoskeletal: No chest wall abnormality. No acute or significant osseous findings.  IMPRESSION: 1. Posterior segment right upper lobe pulmonary nodule measuring up to 14 mm. Consider one of the following in 3 months for  both low-risk and high-risk individuals: (a) repeat chest CT, (b) follow-up PET-CT, or (c) tissue sampling. This recommendation follows the consensus statement: Guidelines for Management of Incidental Pulmonary Nodules Detected on CT Images: From the Fleischner Society 2017; Radiology 2017; 284:228-243. 2. Aortic Atherosclerosis (ICD10-I70.0) and mild Emphysema (ICD10-J43.9).   Electronically Signed   By: Ulyses Jarred M.D.   On: 07/13/2017 16:09NUCLEAR MEDICINE PET SKULL BASE TO THIGH  TECHNIQUE: 4.9 mCi F-18 FDG was injected intravenously. Full-ring PET imaging was performed from the skull base to thigh after the radiotracer. CT data was obtained and used for attenuation correction and anatomic localization.  FASTING BLOOD GLUCOSE:  Value: 96 mg/dl  COMPARISON:  Chest CT 07/14/2015.  FINDINGS: NECK: No hypermetabolic lymph nodes in the neck.  CHEST: Right upper lobe pulmonary nodule (image 23 series 8 today) is hypermetabolic with SUV max = 6.4. No hypermetabolic metastatic disease evident in the mediastinum or hilar regions. No other hypermetabolic lung nodules.  Centrilobular emphysema noted bilaterally with biapical pleural-parenchymal scarring.  ABDOMEN/PELVIS: No abnormal  hypermetabolic activity within the liver, pancreas, adrenal glands, or spleen. No hypermetabolic lymph nodes in the abdomen or pelvis.  Linear calcification in the spleen likely related to prior trauma or infection. Atherosclerotic calcification noted in the wall of the abdominal aorta 11 mm hyperattenuating lesion lower pole left kidney again noted as seen on prior CT imaging. No evidence for hypermetabolism in this lesion on PET imaging and features likely related to cyst complicated by proteinaceous debris or hemorrhage. Diverticular changes noted left colon without diverticulitis.  SKELETON: No focal hypermetabolic activity to suggest skeletal metastasis.  IMPRESSION: 1. Hypermetabolic 14 mm pulmonary nodule right upper lobe consistent with neoplasm. No evidence for distant hypermetabolic disease in the neck, chest, abdomen, or pelvis. 2.  Emphysema. (ICD10-J43.9) 3.  Aortic Atherosclerois (ICD10-170.0)   Electronically Signed   By: Misty Stanley M.D.   On: 08/09/2017 13:21 I personally reviewed the CT and PET images and concur with the findings noted above  Impression: 46 yo woman with a 60 pack year history of smoking who was incidentally found to have a RUL nodule while being worked up for weight loss. The nodule is markedly hypermetabolic on PET and is highly suspicious for a new primary bronchogenic carcinoma. Granulomatous disease is also in the differential but this has to be considered a lung cancer until proven otherwise.  We discussed options for diagnosis and treatment including CT biopsy v wedge and surgery v radiation. WE discussed the pros and cons of each.   My recommendation and the consensus of the [place] group is to proceed directly to surgery for wedge resection to confirm diagnosis followed by lobectomy if the frozen shows cancer. I discussed the general nature of the procedure, the need for general anesthesia, the incisions to be used, and the use of a  drainage tube with Ms Magistro. We discussed the expected hospital stay, overall recovery and short and long term outcomes. I reviewed the indications, risks, benefits and alternatives with her. She understands the risks include, but are not limited to death, MI, DVT/PE, bleeding, possible need for transfusion, infections, irregular heart rhythms, prolonged air leaks, as well as other organ system dysfunction including respiratory, renal, or GI complications.   She accepts the risks and agrees to proceed.  Needs PFTs prior to surgery  Plan: PFTs Right VATS, wedge resection, possible lobectomy on Monday 08/20/2017  [name], MD [facility] [phone]

## 2017-08-09 NOTE — Therapy (Signed)
[place], [place], [address] Phone: [phone]   Fax:  [phone]  Physical Therapy Evaluation  Patient Details  Name: Heather [name] MRN: [id number] Date of Birth: 10/04/1970 Referring Provider: Dr. Modesto Charon   Encounter Date: 08/09/2017  PT End of Session - 08/09/17 1605    Visit Number  1    Number of Visits  1    PT Start Time  3810    PT Stop Time  1555    PT Time Calculation (min)  25 min    Activity Tolerance  Patient tolerated treatment well    Behavior During Therapy  [facility] for tasks assessed/performed       Past Medical History:  Diagnosis Date  . Blood dyscrasia   . COPD (chronic obstructive pulmonary disease) ([place])   . Current smoker   . Difficult intubation    pt was told difficult intubation at [facility]  . 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 Rapid resting heart rate    happens infrequently    Past Surgical History:  Procedure Laterality Date  . ABDOMINAL HYSTERECTOMY    . CESAREAN SECTION     x 2    There were no vitals filed for this visit.   Subjective Assessment - 08/09/17 1554    Subjective  "I'm nervous."    Patient is accompained by:  -- friend    Pertinent History  Pt. presented with weight loss, cough, and shortness of breath.  Workup showed a pulmonary nodule in right upper lobe, 1.4 cm. She has no tissue diagnosis yet but is expected to have resection. Current smoker.  Cervical cancer s/p hysterectomy.  Family h/o lung CA.  COPD.    Patient Stated Goals  get ino from all clinic providers    Currently in Pain?  Yes    Pain Score  0-No pain intermittent    Pain Location  Chest deep to her breast    Pain Descriptors / Indicators  Sharp    Pain Frequency  Intermittent    Aggravating Factors   nothing    Pain Relieving Factors  time         [facility] PT Assessment - 08/09/17 0001      Assessment   Medical Diagnosis  right upper  lobe nodule without tissue diagnosis    Referring Provider  Dr. Modesto Charon    Onset Date/Surgical Date  07/04/17    Hand Dominance  Right    Prior Therapy  none      Precautions   Precautions  None      Restrictions   Weight Bearing Restrictions  No      Balance Screen   Has the patient fallen in the past 6 months  No    Has the patient had a decrease in activity level because of a fear of falling?   No    Is the patient reluctant to leave their home because of a fear of falling?   No      Home Environment   Living Environment  Private residence    Living Arrangements  Children son, age 64    Type of [place]  Two level      Prior Function   Level of Independence  Independent    Vocation  Full time employment    Scientist, water quality at [employer]  had been walking 3-4x/week, though  more recently 2x/week; walks 45-60 mins., not at a fast pace      Cognition   Overall Cognitive Status  Within Functional Limits for tasks assessed      Observation/Other Assessments   Observations  very thin woman      Coordination   Gross Motor Movements are Fluid and Coordinated  Yes      Functional Tests   Functional tests  Sit to Stand      Sit to Stand   Comments  16 times in 30 seconds with moderate dyspnea and tired legs resulting good      Posture/Postural Control   Posture/Postural Control  Postural limitations    Postural Limitations  Forward head      ROM / Strength   AROM / PROM / Strength  AROM      AROM   Overall AROM Comments  trunk A/ROM in standing WFL for all motions      Ambulation/Gait   Ambulation/Gait  Yes    Ambulation/Gait Assistance  7: Independent    Gait Comments  she reports some shortness of breath with walking a distance or climbing stairs      Balance   Balance Assessed  Yes      Dynamic Standing Balance   Dynamic Standing - Comments  reaches forward 12 inches in standing, below average for  age             Objective measurements completed on examination: See above findings.              PT Education - 08/09/17 1604    Education provided  Yes    Education Details  energy conservation, walking, CURE article on staying active, "Why exercise?" flyer, posture, breathing, PT info    Person(s) Educated  Patient;Other (comment) friend    Methods  Explanation;Handout    Comprehension  Verbalized understanding            Lung Clinic Goals - 08/09/17 1610      Patient will be able to verbalize understanding of the benefit of exercise to decrease fatigue.   Status  Achieved      Patient will be able to verbalize the importance of posture.   Status  Achieved      Patient will be able to demonstrate diaphragmatic breathing for improved lung function.   Status  Achieved      Patient will be able to verbalize understanding of the role of physical therapy to prevent functional decline and who to contact if physical therapy is needed.   Status  Achieved           Plan - 08/09/17 1606    Clinical Impression Statement  This patient has a lung nodule that has not been biopsied yet; she is expected to have resection.  She has forward head posture and some shortness of breath with activity.    History and Personal Factors relevant to plan of care:  h/o cervical cancer s/p hysterectomy, current smoker, COPD    Clinical Presentation  Evolving    Clinical Presentation due to:  expected to have surgery for this soon    Clinical Decision Making  Low    Rehab Potential  Excellent    PT Frequency  One time visit    PT Treatment/Interventions  Patient/family education    PT Next Visit Plan  No follow-up currently needed; she would likely benefit from pulmonary rehab after her surgery.  PT Home Exercise Plan  walking, breathing exercise, practice good posture    Recommended Other Services  pulmonary rehab       Patient will benefit from skilled therapeutic  intervention in order to improve the following deficits and impairments:  Cardiopulmonary status limiting activity, Postural dysfunction  Visit Diagnosis: Difficulty in walking, not elsewhere classified - Plan: PT plan of care cert/re-cert  Abnormal posture - Plan: PT plan of care cert/re-cert     Problem List Patient Active Problem List   Diagnosis Date Noted  . Pulmonary nodule 07/16/2017  . Cervical adenopathy 07/04/2017  . Abdominal pain 01/02/2017  . Factor V Leiden ([place]) 04/13/2015  . Prediabetes 04/13/2015  . History of colon polyps 04/13/2015  . H/O drug rash due to contrast  05/14/2014  . Loss of weight 04/02/2014  . Current smoker 04/02/2014  . Routine health maintenance 04/02/2014  . History of anemia 05/08/2007    Heather [name] 08/09/2017, 4:13 PM  [place], [place], [address] Phone: [phone]   Fax:  [phone]  Name: Heather [name] MRN: [id number] Date of Birth: 03/08/1971  Serafina Royals, PT 08/09/17 4:13 PM

## 2017-08-10 ENCOUNTER — Telehealth: Payer: Self-pay | Admitting: *Deleted

## 2017-08-10 ENCOUNTER — Encounter: Payer: Self-pay | Admitting: *Deleted

## 2017-08-10 ENCOUNTER — Other Ambulatory Visit: Payer: Self-pay

## 2017-08-10 DIAGNOSIS — R911 Solitary pulmonary nodule: Secondary | ICD-10-CM

## 2017-08-10 NOTE — Telephone Encounter (Signed)
Oncology Nurse Navigator Documentation  Oncology Nurse Navigator Flowsheets 08/10/2017  Navigator Location [facility]  Navigator Encounter Type Telephone;Clinic/MDC/per Dr. Roxan Hockey, I ordered PFT's. I called [place] resp care and scheduled. I called Heather [name] and updated her on appt and pre-procedure instructions. She verbalized understanding. I also spoke to her about quitting smoking before surgery. She states she is trying to quit. I listened as she explained.  I did speak with her yesterday at clinic and updated her on next steps.   Telephone Outgoing Call  Patient Visit Type MedOnc  Treatment Phase Abnormal Scans  Barriers/Navigation Needs Coordination of Care;Education  Education Other  Interventions Coordination of Care;Education  Coordination of Care Appts  Education Method Verbal  Acuity Level 2  Time Spent with Patient 13

## 2017-08-13 ENCOUNTER — Ambulatory Visit (HOSPITAL_COMMUNITY)
Admission: RE | Admit: 2017-08-13 | Discharge: 2017-08-13 | Disposition: A | Discharge status: Home / Self Care | Payer: 59 | Source: Ambulatory Visit | Attending: Thoracic Surgery (Cardiothoracic Vascular Surgery) | Admitting: Thoracic Surgery (Cardiothoracic Vascular Surgery)

## 2017-08-13 DIAGNOSIS — R911 Solitary pulmonary nodule: Secondary | ICD-10-CM | POA: Diagnosis present

## 2017-08-13 DIAGNOSIS — J984 Other disorders of lung: Secondary | ICD-10-CM | POA: Insufficient documentation

## 2017-08-13 LAB — PULMONARY FUNCTION TEST
DL/VA % pred: 77 %
DL/VA: 3.62 ml/min/mmHg/L
DLCO UNC % PRED: 76 %
DLCO unc: 17.47 ml/min/mmHg
FEF 25-75 PRE: 2.81 L/s
FEF 25-75 Post: 2.66 L/sec
FEF2575-%Change-Post: -5 %
FEF2575-%PRED-PRE: 100 %
FEF2575-%Pred-Post: 95 %
FEV1-%Change-Post: -2 %
FEV1-%PRED-POST: 93 %
FEV1-%Pred-Pre: 95 %
FEV1-POST: 2.57 L
FEV1-PRE: 2.64 L
FEV1FVC-%CHANGE-POST: -4 %
FEV1FVC-%PRED-PRE: 102 %
FEV6-%Change-Post: 3 %
FEV6-%Pred-Post: 96 %
FEV6-%Pred-Pre: 93 %
FEV6-Post: 3.26 L
FEV6-Pre: 3.16 L
FEV6FVC-%PRED-PRE: 102 %
FEV6FVC-%Pred-Post: 102 %
FVC-%Change-Post: 2 %
FVC-%PRED-PRE: 91 %
FVC-%Pred-Post: 94 %
FVC-PRE: 3.18 L
FVC-Post: 3.26 L
POST FEV1/FVC RATIO: 79 %
POST FEV6/FVC RATIO: 100 %
PRE FEV1/FVC RATIO: 83 %
Pre FEV6/FVC Ratio: 100 %
RV % PRED: 160 %
RV: 2.71 L
TLC % PRED: 120 %
TLC: 5.91 L

## 2017-08-13 MED ORDER — ALBUTEROL SULFATE (2.5 MG/3ML) 0.083% IN NEBU
2.5000 mg | INHALATION_SOLUTION | Freq: Once | RESPIRATORY_TRACT | Status: AC
  Administered 2017-08-13: 2.5 mg via RESPIRATORY_TRACT

## 2017-08-17 ENCOUNTER — Encounter (HOSPITAL_COMMUNITY): Payer: Self-pay

## 2017-08-17 ENCOUNTER — Ambulatory Visit (HOSPITAL_COMMUNITY)
Admission: RE | Admit: 2017-08-17 | Discharge: 2017-08-17 | Disposition: A | Discharge status: Home / Self Care | Payer: 59 | Source: Ambulatory Visit | Attending: Thoracic Surgery (Cardiothoracic Vascular Surgery) | Admitting: Thoracic Surgery (Cardiothoracic Vascular Surgery)

## 2017-08-17 ENCOUNTER — Other Ambulatory Visit: Payer: Self-pay

## 2017-08-17 ENCOUNTER — Encounter (HOSPITAL_COMMUNITY)
Admission: RE | Admit: 2017-08-17 | Discharge: 2017-08-17 | Disposition: A | Discharge status: Home / Self Care | Payer: 59 | Source: Ambulatory Visit | Attending: Thoracic Surgery (Cardiothoracic Vascular Surgery) | Admitting: Thoracic Surgery (Cardiothoracic Vascular Surgery)

## 2017-08-17 DIAGNOSIS — Z01818 Encounter for other preprocedural examination: Secondary | ICD-10-CM | POA: Insufficient documentation

## 2017-08-17 DIAGNOSIS — Z01810 Encounter for preprocedural cardiovascular examination: Secondary | ICD-10-CM

## 2017-08-17 DIAGNOSIS — Z01812 Encounter for preprocedural laboratory examination: Secondary | ICD-10-CM | POA: Insufficient documentation

## 2017-08-17 DIAGNOSIS — R911 Solitary pulmonary nodule: Secondary | ICD-10-CM

## 2017-08-17 LAB — COMPREHENSIVE METABOLIC PANEL
ALK PHOS: 44 U/L (ref 38–126)
ALT: 13 U/L — AB (ref 14–54)
AST: 17 U/L (ref 15–41)
Albumin: 4.5 g/dL (ref 3.5–5.0)
Anion gap: 8 (ref 5–15)
BUN: 5 mg/dL — AB (ref 6–20)
CALCIUM: 9.2 mg/dL (ref 8.9–10.3)
CO2: 22 mmol/L (ref 22–32)
CREATININE: 0.69 mg/dL (ref 0.44–1.00)
Chloride: 107 mmol/L (ref 101–111)
Glucose, Bld: 111 mg/dL — ABNORMAL HIGH (ref 65–99)
Potassium: 4 mmol/L (ref 3.5–5.1)
Sodium: 137 mmol/L (ref 135–145)
Total Bilirubin: 0.4 mg/dL (ref 0.3–1.2)
Total Protein: 7 g/dL (ref 6.5–8.1)

## 2017-08-17 LAB — BLOOD GAS, ARTERIAL
Acid-base deficit: 1.2 mmol/L (ref 0.0–2.0)
Bicarbonate: 23 mmol/L (ref 20.0–28.0)
DRAWN BY: 421801
FIO2: 21
O2 Saturation: 96.8 %
PCO2 ART: 38.1 mmHg (ref 32.0–48.0)
PH ART: 7.397 (ref 7.350–7.450)
PO2 ART: 92.4 mmHg (ref 83.0–108.0)
Patient temperature: 98.6

## 2017-08-17 LAB — CBC
HEMATOCRIT: 40.2 % (ref 36.0–46.0)
HEMOGLOBIN: 13.3 g/dL (ref 12.0–15.0)
MCH: 31.1 pg (ref 26.0–34.0)
MCHC: 33.1 g/dL (ref 30.0–36.0)
MCV: 93.9 fL (ref 78.0–100.0)
Platelets: 133 10*3/uL — ABNORMAL LOW (ref 150–400)
RBC: 4.28 MIL/uL (ref 3.87–5.11)
RDW: 13.6 % (ref 11.5–15.5)
WBC: 8.6 10*3/uL (ref 4.0–10.5)

## 2017-08-17 LAB — URINALYSIS, ROUTINE W REFLEX MICROSCOPIC
BILIRUBIN URINE: NEGATIVE
Glucose, UA: NEGATIVE mg/dL
KETONES UR: NEGATIVE mg/dL
NITRITE: NEGATIVE
PH: 5 (ref 5.0–8.0)
Protein, ur: NEGATIVE mg/dL
SPECIFIC GRAVITY, URINE: 1.012 (ref 1.005–1.030)
SQUAMOUS EPITHELIAL / LPF: NONE SEEN

## 2017-08-17 LAB — PROTIME-INR
INR: 0.98
PROTHROMBIN TIME: 12.9 s (ref 11.4–15.2)

## 2017-08-17 LAB — SURGICAL PCR SCREEN
MRSA, PCR: NEGATIVE
STAPHYLOCOCCUS AUREUS: POSITIVE — AB

## 2017-08-17 LAB — TYPE AND SCREEN
ABO/RH(D): A POS
Antibody Screen: NEGATIVE

## 2017-08-17 LAB — APTT: aPTT: 28 seconds (ref 24–36)

## 2017-08-17 LAB — ABO/RH: ABO/RH(D): A POS

## 2017-08-17 MED FILL — MUPIROCIN 2% OINTMENT: 2 | 10 days supply | Qty: 22 | Fill #0

## 2017-08-17 NOTE — Pre-Procedure Instructions (Signed)
Heather [name]  08/17/2017      [place], [place] - [address] [facility]. [address] [place] [address] Phone: [phone] Fax: [phone]    Your procedure is scheduled on  Monday 08/20/17  Report to [facility] Admitting at 1000 A.M.  Call this number if you have problems the morning of surgery:  [phone]   Remember:  Do not eat food or drink liquids after midnight.  Take these medicines the morning of surgery with A SIP OF WATER     Do not wear jewelry, make-up or nail polish.  Do not wear lotions, powders, or perfumes, or deoderant.  Do not shave 48 hours prior to surgery.  Men may shave face and neck.  Do not bring valuables to the hospital.  [facility] is not responsible for any belongings or valuables.  Contacts, dentures or bridgework may not be worn into surgery.  Leave your suitcase in the car.  After surgery it may be brought to your room.  For patients admitted to the hospital, discharge time will be determined by your treatment team.  Patients discharged the day of surgery will not be allowed to drive home.   Name and phone number of your driver:    Special instructions:  [place] - Preparing for Surgery  Before surgery, you can play an important role.  Because skin is not sterile, your skin needs to be as free of germs as possible.  You can reduce the number of germs on you skin by washing with CHG (chlorahexidine gluconate) soap before surgery.  CHG is an antiseptic cleaner which kills germs and bonds with the skin to continue killing germs even after washing.  Please DO NOT use if you have an allergy to CHG or antibacterial soaps.  If your skin becomes reddened/irritated stop using the CHG and inform your nurse when you arrive at Short Stay.  Do not shave (including legs and underarms) for at least 48 hours prior to the first CHG shower.  You may shave your face.  Please follow these  instructions carefully:   1.  Shower with CHG Soap the night before surgery and the                                morning of Surgery.  2.  If you choose to wash your hair, wash your hair first as usual with your       normal shampoo.  3.  After you shampoo, rinse your hair and body thoroughly to remove the                      Shampoo.  4.  Use CHG as you would any other liquid soap.  You can apply chg directly       to the skin and wash gently with scrungie or a clean washcloth.  5.  Apply the CHG Soap to your body ONLY FROM THE NECK DOWN.        Do not use on open wounds or open sores.  Avoid contact with your eyes,       ears, mouth and genitals (private parts).  Wash genitals (private parts)       with your normal soap.  6.  Wash thoroughly, paying special attention to the area where your surgery        will  be performed.  7.  Thoroughly rinse your body with warm water from the neck down.  8.  DO NOT shower/wash with your normal soap after using and rinsing off       the CHG Soap.  9.  Pat yourself dry with a clean towel.            10.  Wear clean pajamas.            11.  Place clean sheets on your bed the night of your first shower and do not        sleep with pets.  Day of Surgery  Do not apply any lotions/deoderants the morning of surgery.  Please wear clean clothes to the hospital/surgery center.    Please read over the following fact sheets that you were given. MRSA Information and Surgical Site Infection Prevention

## 2017-08-17 NOTE — Progress Notes (Signed)
NOTIFIED JOLENE OF LEUKOCYTES IN UA.

## 2017-08-20 ENCOUNTER — Encounter (HOSPITAL_COMMUNITY)
Admission: RE | Disposition: A | Discharge status: Home Health | Payer: Self-pay | Source: Ambulatory Visit | Attending: Thoracic Surgery (Cardiothoracic Vascular Surgery)

## 2017-08-20 ENCOUNTER — Inpatient Hospital Stay (HOSPITAL_COMMUNITY)
Admission: RE | Admit: 2017-08-20 | Discharge: 2017-08-28 | DRG: 164 | Disposition: A | Discharge status: Home Health | Payer: 59 | Source: Ambulatory Visit | Attending: Thoracic Surgery (Cardiothoracic Vascular Surgery) | Admitting: Thoracic Surgery (Cardiothoracic Vascular Surgery)

## 2017-08-20 ENCOUNTER — Inpatient Hospital Stay (HOSPITAL_COMMUNITY): Payer: 59 | Admitting: Certified Registered Nurse Anesthetist

## 2017-08-20 ENCOUNTER — Encounter (HOSPITAL_COMMUNITY): Payer: Self-pay | Admitting: Certified Registered Nurse Anesthetist

## 2017-08-20 ENCOUNTER — Inpatient Hospital Stay (HOSPITAL_COMMUNITY): Payer: 59

## 2017-08-20 ENCOUNTER — Other Ambulatory Visit: Payer: Self-pay

## 2017-08-20 DIAGNOSIS — J8410 Pulmonary fibrosis, unspecified: Secondary | ICD-10-CM | POA: Diagnosis not present

## 2017-08-20 DIAGNOSIS — R7303 Prediabetes: Secondary | ICD-10-CM | POA: Diagnosis not present

## 2017-08-20 DIAGNOSIS — T797XXA Traumatic subcutaneous emphysema, initial encounter: Secondary | ICD-10-CM | POA: Diagnosis not present

## 2017-08-20 DIAGNOSIS — Z888 Allergy status to other drugs, medicaments and biological substances status: Secondary | ICD-10-CM | POA: Diagnosis not present

## 2017-08-20 DIAGNOSIS — J939 Pneumothorax, unspecified: Secondary | ICD-10-CM | POA: Diagnosis not present

## 2017-08-20 DIAGNOSIS — R590 Localized enlarged lymph nodes: Secondary | ICD-10-CM | POA: Diagnosis not present

## 2017-08-20 DIAGNOSIS — Z09 Encounter for follow-up examination after completed treatment for conditions other than malignant neoplasm: Secondary | ICD-10-CM

## 2017-08-20 DIAGNOSIS — J449 Chronic obstructive pulmonary disease, unspecified: Secondary | ICD-10-CM | POA: Diagnosis present

## 2017-08-20 DIAGNOSIS — C3411 Malignant neoplasm of upper lobe, right bronchus or lung: Principal | ICD-10-CM | POA: Diagnosis present

## 2017-08-20 DIAGNOSIS — J9811 Atelectasis: Secondary | ICD-10-CM | POA: Diagnosis present

## 2017-08-20 DIAGNOSIS — F17210 Nicotine dependence, cigarettes, uncomplicated: Secondary | ICD-10-CM | POA: Diagnosis present

## 2017-08-20 DIAGNOSIS — I700 Atherosclerosis of aorta: Secondary | ICD-10-CM | POA: Diagnosis present

## 2017-08-20 DIAGNOSIS — R109 Unspecified abdominal pain: Secondary | ICD-10-CM | POA: Diagnosis not present

## 2017-08-20 DIAGNOSIS — Z886 Allergy status to analgesic agent status: Secondary | ICD-10-CM | POA: Diagnosis not present

## 2017-08-20 DIAGNOSIS — Z801 Family history of malignant neoplasm of trachea, bronchus and lung: Secondary | ICD-10-CM | POA: Diagnosis not present

## 2017-08-20 DIAGNOSIS — D696 Thrombocytopenia, unspecified: Secondary | ICD-10-CM | POA: Diagnosis not present

## 2017-08-20 DIAGNOSIS — R911 Solitary pulmonary nodule: Secondary | ICD-10-CM | POA: Diagnosis not present

## 2017-08-20 DIAGNOSIS — R0602 Shortness of breath: Secondary | ICD-10-CM | POA: Diagnosis not present

## 2017-08-20 DIAGNOSIS — R918 Other nonspecific abnormal finding of lung field: Secondary | ICD-10-CM | POA: Diagnosis not present

## 2017-08-20 DIAGNOSIS — J9382 Other air leak: Secondary | ICD-10-CM | POA: Diagnosis not present

## 2017-08-20 DIAGNOSIS — J984 Other disorders of lung: Secondary | ICD-10-CM | POA: Diagnosis not present

## 2017-08-20 DIAGNOSIS — D6851 Activated protein C resistance: Secondary | ICD-10-CM | POA: Diagnosis present

## 2017-08-20 DIAGNOSIS — Z902 Acquired absence of lung [part of]: Secondary | ICD-10-CM

## 2017-08-20 HISTORY — PX: LYMPH NODE DISSECTION: SHX5087

## 2017-08-20 HISTORY — PX: VIDEO ASSISTED THORACOSCOPY (VATS)/WEDGE RESECTION: SHX6174

## 2017-08-20 HISTORY — PX: LOBECTOMY: SHX5089

## 2017-08-20 SURGERY — VIDEO ASSISTED THORACOSCOPY (VATS)/WEDGE RESECTION
Anesthesia: General | Site: Chest | Laterality: Right

## 2017-08-20 MED ORDER — DEXTROSE 5 % IV SOLN
INTRAVENOUS | Status: DC | PRN
  Administered 2017-08-20: 15 ug/min via INTRAVENOUS

## 2017-08-20 MED ORDER — PROPOFOL 10 MG/ML IV BOLUS
INTRAVENOUS | Status: DC | PRN
  Administered 2017-08-20: 130 mg via INTRAVENOUS

## 2017-08-20 MED ORDER — LIDOCAINE 2% (20 MG/ML) 5 ML SYRINGE
INTRAMUSCULAR | Status: AC
  Filled 2017-08-20: qty 5

## 2017-08-20 MED ORDER — MUPIROCIN 2 % EX OINT
TOPICAL_OINTMENT | CUTANEOUS | Status: AC
  Administered 2017-08-20: 1
  Filled 2017-08-20: qty 22

## 2017-08-20 MED ORDER — NALOXONE HCL 0.4 MG/ML IJ SOLN: 0.4000 mg | INTRAMUSCULAR | Status: DC | PRN

## 2017-08-20 MED ORDER — POTASSIUM CHLORIDE 10 MEQ/50ML IV SOLN: 10.0000 meq | Freq: Every day | INTRAVENOUS | Status: DC | PRN

## 2017-08-20 MED ORDER — DEXTROSE-NACL 5-0.9 % IV SOLN
INTRAVENOUS | Status: DC
  Administered 2017-08-20: 23:00:00 via INTRAVENOUS

## 2017-08-20 MED ORDER — MIDAZOLAM HCL 2 MG/2ML IJ SOLN
INTRAMUSCULAR | Status: AC
  Administered 2017-08-20: 1 mg via INTRAVENOUS
  Filled 2017-08-20: qty 2

## 2017-08-20 MED ORDER — SODIUM CHLORIDE 0.9% FLUSH: 9.0000 mL | INTRAVENOUS | Status: DC | PRN

## 2017-08-20 MED ORDER — ONDANSETRON HCL 4 MG/2ML IJ SOLN
INTRAMUSCULAR | Status: AC
  Filled 2017-08-20: qty 2

## 2017-08-20 MED ORDER — BISACODYL 5 MG PO TBEC
10.0000 mg | DELAYED_RELEASE_TABLET | Freq: Every day | ORAL | Status: DC
  Administered 2017-08-20 – 2017-08-25 (×5): 10 mg via ORAL
  Filled 2017-08-20 (×6): qty 2

## 2017-08-20 MED ORDER — FENTANYL CITRATE (PF) 100 MCG/2ML IJ SOLN
INTRAMUSCULAR | Status: AC
  Administered 2017-08-20: 50 ug via INTRAVENOUS
  Filled 2017-08-20: qty 2

## 2017-08-20 MED ORDER — BUPIVACAINE HCL (PF) 0.5 % IJ SOLN
INTRAMUSCULAR | Status: AC
  Filled 2017-08-20: qty 30

## 2017-08-20 MED ORDER — METOCLOPRAMIDE HCL 5 MG/ML IJ SOLN
10.0000 mg | Freq: Four times a day (QID) | INTRAMUSCULAR | Status: AC
  Administered 2017-08-20 – 2017-08-21 (×4): 10 mg via INTRAVENOUS
  Filled 2017-08-20 (×4): qty 2

## 2017-08-20 MED ORDER — CEFUROXIME SODIUM 1.5 G IV SOLR
1.5000 g | INTRAVENOUS | Status: AC
  Administered 2017-08-20: 1.5 g via INTRAVENOUS

## 2017-08-20 MED ORDER — MIDAZOLAM HCL 2 MG/2ML IJ SOLN
INTRAMUSCULAR | Status: AC
  Filled 2017-08-20: qty 2

## 2017-08-20 MED ORDER — BUPIVACAINE 0.5 % ON-Q PUMP SINGLE CATH 400 ML: INJECTION | Status: DC | PRN

## 2017-08-20 MED ORDER — ONDANSETRON HCL 4 MG/2ML IJ SOLN: 4.0000 mg | Freq: Four times a day (QID) | INTRAMUSCULAR | Status: DC | PRN

## 2017-08-20 MED ORDER — MUPIROCIN 2 % EX OINT: 1.0000 "application " | TOPICAL_OINTMENT | Freq: Two times a day (BID) | CUTANEOUS | Status: DC

## 2017-08-20 MED ORDER — DEXTROSE 5 % IV SOLN
INTRAVENOUS | Status: AC
  Filled 2017-08-20: qty 1.5

## 2017-08-20 MED ORDER — ACETAMINOPHEN 160 MG/5ML PO SOLN
1000.0000 mg | Freq: Four times a day (QID) | ORAL | Status: AC
  Filled 2017-08-20: qty 40.6

## 2017-08-20 MED ORDER — OXYCODONE HCL 5 MG/5ML PO SOLN: 5.0000 mg | Freq: Once | ORAL | Status: DC | PRN

## 2017-08-20 MED ORDER — FENTANYL CITRATE (PF) 250 MCG/5ML IJ SOLN
INTRAMUSCULAR | Status: AC
Start: 2017-08-20 — End: 2017-08-20
  Filled 2017-08-20: qty 10

## 2017-08-20 MED ORDER — ROCURONIUM BROMIDE 10 MG/ML (PF) SYRINGE
PREFILLED_SYRINGE | INTRAVENOUS | Status: DC | PRN
  Administered 2017-08-20 (×2): 10 mg via INTRAVENOUS
  Administered 2017-08-20: 50 mg via INTRAVENOUS
  Administered 2017-08-20: 10 mg via INTRAVENOUS
  Administered 2017-08-20: 20 mg via INTRAVENOUS

## 2017-08-20 MED ORDER — OXYCODONE HCL 5 MG PO TABS
5.0000 mg | ORAL_TABLET | ORAL | Status: DC | PRN
  Administered 2017-08-22: 10 mg via ORAL
  Administered 2017-08-25 – 2017-08-27 (×2): 5 mg via ORAL
  Filled 2017-08-20: qty 2
  Filled 2017-08-20 (×2): qty 1

## 2017-08-20 MED ORDER — SUGAMMADEX SODIUM 200 MG/2ML IV SOLN
INTRAVENOUS | Status: AC
  Filled 2017-08-20: qty 2

## 2017-08-20 MED ORDER — LACTATED RINGERS IV SOLN
INTRAVENOUS | Status: DC
Start: 2017-08-20 — End: 2017-08-20
  Administered 2017-08-20 (×2): via INTRAVENOUS

## 2017-08-20 MED ORDER — BUPIVACAINE HCL (PF) 0.5 % IJ SOLN
INTRAMUSCULAR | Status: DC | PRN
  Administered 2017-08-20: 10 mL

## 2017-08-20 MED ORDER — FENTANYL CITRATE (PF) 100 MCG/2ML IJ SOLN
INTRAMUSCULAR | Status: DC | PRN
  Administered 2017-08-20: 200 ug via INTRAVENOUS
  Administered 2017-08-20 (×3): 50 ug via INTRAVENOUS

## 2017-08-20 MED ORDER — PHENYLEPHRINE HCL 10 MG/ML IJ SOLN
INTRAMUSCULAR | Status: AC
  Filled 2017-08-20: qty 1

## 2017-08-20 MED ORDER — CEFUROXIME SODIUM 1.5 G IV SOLR
1.5000 g | Freq: Two times a day (BID) | INTRAVENOUS | Status: AC
  Administered 2017-08-20 – 2017-08-21 (×2): 1.5 g via INTRAVENOUS
  Filled 2017-08-20 (×2): qty 1.5

## 2017-08-20 MED ORDER — BUPIVACAINE 0.5 % ON-Q PUMP SINGLE CATH 400 ML
400.0000 mL | INJECTION | Status: AC
  Administered 2017-08-20: 400 mL
  Filled 2017-08-20: qty 400

## 2017-08-20 MED ORDER — PHENYLEPHRINE HCL 10 MG/ML IJ SOLN
INTRAMUSCULAR | Status: DC | PRN
  Administered 2017-08-20 (×3): 80 ug via INTRAVENOUS

## 2017-08-20 MED ORDER — SENNOSIDES-DOCUSATE SODIUM 8.6-50 MG PO TABS
1.0000 | ORAL_TABLET | Freq: Every day | ORAL | Status: DC
  Administered 2017-08-20 – 2017-08-25 (×4): 1 via ORAL
  Filled 2017-08-20 (×5): qty 1

## 2017-08-20 MED ORDER — ONDANSETRON HCL 4 MG/2ML IJ SOLN
4.0000 mg | Freq: Once | INTRAMUSCULAR | Status: AC | PRN
  Administered 2017-08-20: 4 mg via INTRAVENOUS

## 2017-08-20 MED ORDER — LEVALBUTEROL HCL 0.63 MG/3ML IN NEBU
0.6300 mg | INHALATION_SOLUTION | Freq: Four times a day (QID) | RESPIRATORY_TRACT | Status: DC
  Administered 2017-08-21 (×3): 0.63 mg via RESPIRATORY_TRACT
  Filled 2017-08-20 (×3): qty 3

## 2017-08-20 MED ORDER — ONDANSETRON HCL 4 MG/2ML IJ SOLN
INTRAMUSCULAR | Status: DC | PRN
  Administered 2017-08-20: 4 mg via INTRAVENOUS

## 2017-08-20 MED ORDER — FENTANYL CITRATE (PF) 100 MCG/2ML IJ SOLN
25.0000 ug | INTRAMUSCULAR | Status: DC | PRN
  Administered 2017-08-20 (×3): 50 ug via INTRAVENOUS

## 2017-08-20 MED ORDER — 0.9 % SODIUM CHLORIDE (POUR BTL) OPTIME
TOPICAL | Status: DC | PRN
  Administered 2017-08-20: 2000 mL

## 2017-08-20 MED ORDER — FENTANYL 40 MCG/ML IV SOLN
INTRAVENOUS | Status: DC
  Administered 2017-08-20: 120 ug via INTRAVENOUS
  Administered 2017-08-20: 1000 ug via INTRAVENOUS
  Administered 2017-08-21: 105 ug via INTRAVENOUS
  Administered 2017-08-21: 45 ug via INTRAVENOUS
  Administered 2017-08-21: 90 ug via INTRAVENOUS
  Administered 2017-08-21: 15 ug via INTRAVENOUS
  Administered 2017-08-21: 75 ug via INTRAVENOUS
  Administered 2017-08-21: 15 ug via INTRAVENOUS
  Administered 2017-08-21 – 2017-08-22 (×3): 30 ug via INTRAVENOUS
  Administered 2017-08-22: 45 ug via INTRAVENOUS
  Administered 2017-08-22: 60 ug via INTRAVENOUS
  Administered 2017-08-23: 120 ug via INTRAVENOUS
  Administered 2017-08-23: 15 ug via INTRAVENOUS
  Administered 2017-08-23: 105 ug via INTRAVENOUS
  Administered 2017-08-23: 1000 ug via INTRAVENOUS
  Administered 2017-08-23: 120 ug via INTRAVENOUS
  Administered 2017-08-23: 105 ug via INTRAVENOUS
  Administered 2017-08-24: 75 ug via INTRAVENOUS
  Administered 2017-08-24: 60 ug via INTRAVENOUS
  Administered 2017-08-24: 75 ug via INTRAVENOUS
  Administered 2017-08-24: 90 ug via INTRAVENOUS
  Administered 2017-08-24: 60 ug via INTRAVENOUS
  Administered 2017-08-24: 0 ug via INTRAVENOUS
  Administered 2017-08-24: 45 ug via INTRAVENOUS
  Administered 2017-08-25: 180 ug via INTRAVENOUS
  Administered 2017-08-25: 40 ug via INTRAVENOUS
  Administered 2017-08-25: 22:00:00 via INTRAVENOUS
  Administered 2017-08-25 (×2): 40 ug via INTRAVENOUS
  Administered 2017-08-25: 30 ug via INTRAVENOUS
  Administered 2017-08-26: 165 ug via INTRAVENOUS
  Administered 2017-08-26: 120 ug via INTRAVENOUS
  Administered 2017-08-26 (×2): 40 ug via INTRAVENOUS
  Administered 2017-08-26: 60 ug via INTRAVENOUS
  Administered 2017-08-26: 90 ug via INTRAVENOUS
  Administered 2017-08-26: 40 ug via INTRAVENOUS
  Administered 2017-08-27: 180 ug via INTRAVENOUS
  Administered 2017-08-27: 0 ug via INTRAVENOUS
  Administered 2017-08-27: 105 ug via INTRAVENOUS
  Administered 2017-08-27: 90 ug via INTRAVENOUS
  Administered 2017-08-28 (×3): 0 ug via INTRAVENOUS
  Filled 2017-08-20 (×5): qty 25

## 2017-08-20 MED ORDER — TRAMADOL HCL 50 MG PO TABS
50.0000 mg | ORAL_TABLET | Freq: Four times a day (QID) | ORAL | Status: DC | PRN
  Administered 2017-08-27 – 2017-08-28 (×3): 100 mg via ORAL
  Filled 2017-08-20 (×3): qty 2

## 2017-08-20 MED ORDER — PROPOFOL 10 MG/ML IV BOLUS
INTRAVENOUS | Status: AC
  Filled 2017-08-20: qty 40

## 2017-08-20 MED ORDER — PHENYLEPHRINE 40 MCG/ML (10ML) SYRINGE FOR IV PUSH (FOR BLOOD PRESSURE SUPPORT)
PREFILLED_SYRINGE | INTRAVENOUS | Status: AC
  Filled 2017-08-20: qty 10

## 2017-08-20 MED ORDER — SUGAMMADEX SODIUM 200 MG/2ML IV SOLN
INTRAVENOUS | Status: DC | PRN
  Administered 2017-08-20: 100 mg via INTRAVENOUS

## 2017-08-20 MED ORDER — OXYCODONE HCL 5 MG PO TABS: 5.0000 mg | ORAL_TABLET | Freq: Once | ORAL | Status: DC | PRN

## 2017-08-20 MED ORDER — MIDAZOLAM HCL 5 MG/5ML IJ SOLN
INTRAMUSCULAR | Status: DC | PRN
  Administered 2017-08-20: 1 mg via INTRAVENOUS

## 2017-08-20 MED ORDER — FENTANYL CITRATE (PF) 100 MCG/2ML IJ SOLN
INTRAMUSCULAR | Status: AC
  Filled 2017-08-20: qty 2

## 2017-08-20 MED ORDER — ACETAMINOPHEN 500 MG PO TABS
1000.0000 mg | ORAL_TABLET | Freq: Four times a day (QID) | ORAL | Status: AC
  Administered 2017-08-20 – 2017-08-25 (×20): 1000 mg via ORAL
  Filled 2017-08-20 (×21): qty 2

## 2017-08-20 MED ORDER — ENOXAPARIN SODIUM 40 MG/0.4ML ~~LOC~~ SOLN
40.0000 mg | SUBCUTANEOUS | Status: DC
  Administered 2017-08-21: 40 mg via SUBCUTANEOUS
  Filled 2017-08-20: qty 0.4

## 2017-08-20 MED ORDER — LACTATED RINGERS IV SOLN
INTRAVENOUS | Status: DC | PRN
  Administered 2017-08-20 (×2): via INTRAVENOUS

## 2017-08-20 MED ORDER — MIDAZOLAM HCL 2 MG/2ML IJ SOLN
1.0000 mg | Freq: Once | INTRAMUSCULAR | Status: AC
  Administered 2017-08-20: 1 mg via INTRAVENOUS

## 2017-08-20 MED ORDER — FENTANYL CITRATE (PF) 100 MCG/2ML IJ SOLN
50.0000 ug | Freq: Once | INTRAMUSCULAR | Status: AC
  Administered 2017-08-20: 50 ug via INTRAVENOUS

## 2017-08-20 MED ORDER — ROCURONIUM BROMIDE 10 MG/ML (PF) SYRINGE
PREFILLED_SYRINGE | INTRAVENOUS | Status: AC
  Filled 2017-08-20: qty 5

## 2017-08-20 MED ORDER — DIPHENHYDRAMINE HCL 12.5 MG/5ML PO ELIX
12.5000 mg | ORAL_SOLUTION | Freq: Four times a day (QID) | ORAL | Status: DC | PRN
  Filled 2017-08-20: qty 5

## 2017-08-20 MED ORDER — DEXAMETHASONE SODIUM PHOSPHATE 10 MG/ML IJ SOLN
INTRAMUSCULAR | Status: AC
  Filled 2017-08-20: qty 1

## 2017-08-20 MED ORDER — DIPHENHYDRAMINE HCL 50 MG/ML IJ SOLN
12.5000 mg | Freq: Four times a day (QID) | INTRAMUSCULAR | Status: DC | PRN
Start: 2017-08-20 — End: 2017-08-28

## 2017-08-20 MED ORDER — DEXAMETHASONE SODIUM PHOSPHATE 10 MG/ML IJ SOLN
INTRAMUSCULAR | Status: DC | PRN
  Administered 2017-08-20: 10 mg via INTRAVENOUS

## 2017-08-20 MED ORDER — FENTANYL CITRATE (PF) 100 MCG/2ML IJ SOLN
50.0000 ug | Freq: Once | INTRAMUSCULAR | Status: AC
Start: 2017-08-20 — End: 2017-08-20
  Administered 2017-08-20: 50 ug via INTRAVENOUS

## 2017-08-20 SURGICAL SUPPLY — 108 items
ADH SKN CLS APL DERMABOND .7 (GAUZE/BANDAGES/DRESSINGS) ×3
APL SKNCLS STERI-STRIP NONHPOA (GAUZE/BANDAGES/DRESSINGS)
ATTRACTOMAT 16X20 MAGNETIC DRP (DRAPES) ×2 IMPLANT
BAG SPEC RTRVL 10 TROC 200 (ENDOMECHANICALS)
BAG SPEC RTRVL LRG 6X4 10 (ENDOMECHANICALS)
BENZOIN TINCTURE PRP APPL 2/3 (GAUZE/BANDAGES/DRESSINGS) ×3 IMPLANT
CANISTER SUCT 3000ML PPV (MISCELLANEOUS) ×8 IMPLANT
CATH KIT ON Q 5IN SLV (PAIN MANAGEMENT) IMPLANT
CATH KIT ON-Q SILVERSOAK 5 (CATHETERS) IMPLANT
CATH KIT ON-Q SILVERSOAK 5IN (CATHETERS) ×5 IMPLANT
CATH THORACIC 28FR (CATHETERS) IMPLANT
CATH THORACIC 36FR (CATHETERS) IMPLANT
CATH THORACIC 36FR RT ANG (CATHETERS) IMPLANT
CLIP TI MEDIUM 6 (CLIP) ×2 IMPLANT
CLIP VESOCCLUDE MED 6/CT (CLIP) ×3 IMPLANT
CONN ST 1/4X3/8  BEN (MISCELLANEOUS)
CONN ST 1/4X3/8 BEN (MISCELLANEOUS) IMPLANT
CONN Y 3/8X3/8X3/8  BEN (MISCELLANEOUS)
CONN Y 3/8X3/8X3/8 BEN (MISCELLANEOUS) IMPLANT
CONT SPEC 4OZ CLIKSEAL STRL BL (MISCELLANEOUS) ×30 IMPLANT
COVER SURGICAL LIGHT HANDLE (MISCELLANEOUS) ×7 IMPLANT
DECANTER SPIKE VIAL GLASS SM (MISCELLANEOUS) ×2 IMPLANT
DERMABOND ADVANCED (GAUZE/BANDAGES/DRESSINGS) ×2
DERMABOND ADVANCED .7 DNX12 (GAUZE/BANDAGES/DRESSINGS) ×3 IMPLANT
DRAIN CHANNEL 28F RND 3/8 FF (WOUND CARE) IMPLANT
DRAIN CHANNEL 32F RND 10.7 FF (WOUND CARE) IMPLANT
DRAPE LAPAROSCOPIC ABDOMINAL (DRAPES) ×5 IMPLANT
DRAPE WARM FLUID 44X44 (DRAPE) ×5 IMPLANT
ELECT BLADE 6.5 EXT (BLADE) ×5 IMPLANT
ELECT CAUTERY BLADE 6.4 (BLADE) ×2 IMPLANT
ELECT REM PT RETURN 9FT ADLT (ELECTROSURGICAL) ×5
ELECTRODE REM PT RTRN 9FT ADLT (ELECTROSURGICAL) ×3 IMPLANT
GAUZE SPONGE 4X4 12PLY STRL (GAUZE/BANDAGES/DRESSINGS) IMPLANT
GAUZE SPONGE 4X4 12PLY STRL LF (GAUZE/BANDAGES/DRESSINGS) ×2 IMPLANT
GLOVE BIO SURGEON STRL SZ 6.5 (GLOVE) ×1 IMPLANT
GLOVE BIO SURGEON STRL SZ7.5 (GLOVE) ×2 IMPLANT
GLOVE BIO SURGEONS STRL SZ 6.5 (GLOVE) ×1
GLOVE SURG SIGNA 7.5 PF LTX (GLOVE) ×10 IMPLANT
GLOVE SURG SS PI 7.0 STRL IVOR (GLOVE) ×2 IMPLANT
GOWN STRL REUS W/ TWL LRG LVL3 (GOWN DISPOSABLE) ×6 IMPLANT
GOWN STRL REUS W/ TWL XL LVL3 (GOWN DISPOSABLE) ×6 IMPLANT
GOWN STRL REUS W/TWL LRG LVL3 (GOWN DISPOSABLE) ×15
GOWN STRL REUS W/TWL XL LVL3 (GOWN DISPOSABLE) ×15
HANDLE STAPLE ENDO GIA SHORT (STAPLE)
HEMOSTAT SURGICEL 2X14 (HEMOSTASIS) IMPLANT
KIT BASIN OR (CUSTOM PROCEDURE TRAY) ×5 IMPLANT
KIT ROOM TURNOVER OR (KITS) ×5 IMPLANT
KIT SUCTION CATH 14FR (SUCTIONS) ×3 IMPLANT
NS IRRIG 1000ML POUR BTL (IV SOLUTION) ×13 IMPLANT
PACK CHEST (CUSTOM PROCEDURE TRAY) ×5 IMPLANT
PAD ARMBOARD 7.5X6 YLW CONV (MISCELLANEOUS) ×10 IMPLANT
POUCH ENDO CATCH II 15MM (MISCELLANEOUS) ×2 IMPLANT
POUCH RETRIEVAL ECOSAC 10 (ENDOMECHANICALS) IMPLANT
POUCH RETRIEVAL ECOSAC 10MM (ENDOMECHANICALS)
POUCH SPECIMEN RETRIEVAL 10MM (ENDOMECHANICALS) IMPLANT
RELOAD STAPLE 35X2.5 WHT THIN (STAPLE) IMPLANT
RELOAD STAPLE 60 3.8 GOLD REG (STAPLE) IMPLANT
RELOAD STAPLE 60 4.1 GRN THCK (STAPLE) IMPLANT
RELOAD STAPLER GOLD 60MM (STAPLE) ×42 IMPLANT
RELOAD STAPLER GREEN 60MM (STAPLE) ×6 IMPLANT
SCISSORS ENDO CVD 5DCS (MISCELLANEOUS) IMPLANT
SEALANT PROGEL (MISCELLANEOUS) IMPLANT
SEALANT SURG COSEAL 4ML (VASCULAR PRODUCTS) IMPLANT
SEALANT SURG COSEAL 8ML (VASCULAR PRODUCTS) IMPLANT
SHEARS HARMONIC HDI 20CM (ELECTROSURGICAL) IMPLANT
SOLUTION ANTI FOG 6CC (MISCELLANEOUS) ×5 IMPLANT
SPECIMEN JAR MEDIUM (MISCELLANEOUS) ×3 IMPLANT
SPONGE INTESTINAL PEANUT (DISPOSABLE) ×2 IMPLANT
SPONGE TONSIL 1 RF SGL (DISPOSABLE) ×5 IMPLANT
STAPLE ECHEON FLEX 60 POW ENDO (STAPLE) ×2 IMPLANT
STAPLE RELOAD 2.5MM WHITE (STAPLE) ×25 IMPLANT
STAPLER ENDO GIA 12 SHRT THIN (STAPLE) IMPLANT
STAPLER ENDO GIA 12MM SHORT (STAPLE) IMPLANT
STAPLER RELOAD GOLD 60MM (STAPLE) ×70
STAPLER RELOAD GREEN 60MM (STAPLE) ×10
STAPLER VASCULAR ECHELON 35 (CUTTER) ×2 IMPLANT
SUT PROLENE 4 0 RB 1 (SUTURE)
SUT PROLENE 4-0 RB1 .5 CRCL 36 (SUTURE) IMPLANT
SUT SILK  1 MH (SUTURE) ×4
SUT SILK 1 MH (SUTURE) ×6 IMPLANT
SUT SILK 1 TIES 10X30 (SUTURE) ×5 IMPLANT
SUT SILK 2 0 SH (SUTURE) IMPLANT
SUT SILK 2 0SH CR/8 30 (SUTURE) ×2 IMPLANT
SUT SILK 3 0 SH 30 (SUTURE) IMPLANT
SUT SILK 3 0SH CR/8 30 (SUTURE) ×5 IMPLANT
SUT VIC AB 0 CTX 27 (SUTURE) IMPLANT
SUT VIC AB 1 CTX 27 (SUTURE) ×5 IMPLANT
SUT VIC AB 2-0 CT1 27 (SUTURE)
SUT VIC AB 2-0 CT1 TAPERPNT 27 (SUTURE) IMPLANT
SUT VIC AB 2-0 CTX 36 (SUTURE) ×7 IMPLANT
SUT VIC AB 2-0 UR6 27 (SUTURE) ×2 IMPLANT
SUT VIC AB 3-0 MH 27 (SUTURE) IMPLANT
SUT VIC AB 3-0 SH 27 (SUTURE)
SUT VIC AB 3-0 SH 27X BRD (SUTURE) IMPLANT
SUT VIC AB 3-0 X1 27 (SUTURE) ×7 IMPLANT
SUT VICRYL 0 UR6 27IN ABS (SUTURE) IMPLANT
SUT VICRYL 2 TP 1 (SUTURE) IMPLANT
SWAB CULTURE ESWAB REG 1ML (MISCELLANEOUS) IMPLANT
SYSTEM SAHARA CHEST DRAIN ATS (WOUND CARE) ×5 IMPLANT
TAPE CLOTH SURG 4X10 WHT LF (GAUZE/BANDAGES/DRESSINGS) ×2 IMPLANT
TIP APPLICATOR SPRAY EXTEND 16 (VASCULAR PRODUCTS) IMPLANT
TOWEL GREEN STERILE (TOWEL DISPOSABLE) ×5 IMPLANT
TOWEL GREEN STERILE FF (TOWEL DISPOSABLE) ×5 IMPLANT
TRAY FOLEY W/METER SILVER 16FR (SET/KITS/TRAYS/PACK) ×5 IMPLANT
TROCAR XCEL BLADELESS 5X75MML (TROCAR) ×5 IMPLANT
TROCAR XCEL NON-BLD 5MMX100MML (ENDOMECHANICALS) IMPLANT
TUNNELER SHEATH ON-Q 11GX8 DSP (PAIN MANAGEMENT) ×2 IMPLANT
WATER STERILE IRR 1000ML POUR (IV SOLUTION) ×8 IMPLANT

## 2017-08-20 NOTE — Anesthesia Preprocedure Evaluation (Signed)
Anesthesia Evaluation  Patient identified by MRN, date of birth, ID band Patient awake    Reviewed: Allergy & Precautions, NPO status , Patient's Chart, lab work & pertinent test results  Airway Mallampati: II  TM Distance: >3 FB Neck ROM: Full    Dental  (+) Teeth Intact, Dental Advisory Given   Pulmonary Current Smoker,    breath sounds clear to auscultation       Cardiovascular  Rhythm:Regular Rate:Normal     Neuro/Psych    GI/Hepatic   Endo/Other    Renal/GU      Musculoskeletal   Abdominal   Peds  Hematology   Anesthesia Other Findings   Reproductive/Obstetrics                             Anesthesia Physical Anesthesia Plan  ASA: III  Anesthesia Plan: General   Post-op Pain Management:    Induction: Intravenous  PONV Risk Score and Plan: Ondansetron and Dexamethasone  Airway Management Planned: Double Lumen EBT  Additional Equipment: Arterial line and CVP  Intra-op Plan:   Post-operative Plan: Extubation in OR  Informed Consent: I have reviewed the patients History and Physical, chart, labs and discussed the procedure including the risks, benefits and alternatives for the proposed anesthesia with the patient or authorized representative who has indicated his/her understanding and acceptance.   Dental advisory given  Plan Discussed with: CRNA and Anesthesiologist  Anesthesia Plan Comments:         Anesthesia Quick Evaluation

## 2017-08-20 NOTE — Op Note (Signed)
NAMEANYJAH, ROUNDTREE                 ACCOUNT NO.:  [id number]  MEDICAL RECORD NO.:  [id number]  LOCATION:                                 FACILITY:  PHYSICIAN:  Revonda Standard. Roxan Hockey, M.D.DATE OF BIRTH:  10/20/1970  DATE OF PROCEDURE:  08/20/2017 DATE OF DISCHARGE:                              OPERATIVE REPORT   PREOPERATIVE DIAGNOSIS:  Right upper lobe nodule.  POSTOPERATIVE DIAGNOSIS:  Non-small cell carcinoma, clinical stage IA.  PROCEDURES:   Right video-assisted thoracoscopy, Wedge resection right upper lobe x 2, Thoracoscopic right upper lobectomy, Mediastinal lymph node dissection,  On-Q local anesthetic catheter placement.  SURGEON:  Revonda Standard. Roxan Hockey, MD.  ASSISTANT:  Jadene Pierini, PA-C.  ANESTHESIA:  General.  FINDINGS:  Nodular area near the right apex.  Frozen section revealed Fibrosis. 1.5-cm nodule in inferior margin of right upper lobe posterolaterally.  Frozen section revealed non-small cell carcinoma.  CLINICAL NOTE:  Ms. Pinder is a 46 year old woman with a history of tobacco abuse, COPD, and factor V Leiden deficiency.  She has a 43-month history of poor appetite and early satiety and has lost about 26 pounds. She was being evaluated for weight loss.  A workup included a chest x- ray, which showed a right upper lobe nodule.  A CT confirmed the nodule. On PET, the nodule was hypermetabolic with an SUV of 6.4.  The options for diagnosis and treatment were discussed in detail with the patient. She wished to be aggressive and proceed directly with surgery for wedge resection for definitive diagnosis and lobectomy if the nodule turned out to be cancerous.  The indications, risks, benefits, and alternatives were discussed in detail with the patient.  She understood and accepted the risks and agreed to proceed.  OPERATIVE NOTE:  Mrs. Gluth was brought to the operating room on August 20, 2017.  Anesthesia had placed a central line and an arterial  blood pressure monitoring line in the preoperative holding area.  She was given intravenous antibiotics.  She was anesthetized and intubated with a double-lumen endotracheal tube.  Sequential compression devices were placed on the calves for DVT prophylaxis.  A Foley catheter was placed. The patient was placed in a left lateral decubitus position and the right chest was prepped and draped in usual sterile fashion.  Single lung ventilation of the left lung was initiated and it was tolerated well throughout the procedure.  An incision was made in the seventh interspace in the midaxillary line. A 5-mm port was inserted.  The thoracoscope was advanced into the chest. There was good isolation of the right lung.  A working incision was made in the fourth interspace anterolaterally.  No rib spreading was performed during the procedure.  Inspection of the right upper lobe revealed a nodule along the posterolateral aspect of the inferior margin of the right upper lobe.  There also was a larger area that was firm and nodular in character near the apex.  A wedge resection was performed of this using sequential firings of a 60-mm echelon powered stapler with gold cartridges.  The specimen was sent for frozen section.  Next, the nodule that correlated with the finding on CT was  resected with a wedge resection, also using the Echelon stapler.  The specimen was subsequently sent for frozen section as well.  Frozen section on the apical area turned out to show fibrosis and scarring.  There was no tumor seen.  The nodule along the inferior margin of the right upper lobe was consistent with a non-small cell carcinoma.  The decision was made to proceed with lobectomy as discussed with the patient preoperatively. The inferior ligament was divided with electrocautery.  A level 9 node was removed and sent for permanent pathology.  The pleura was divided at the hilum posteriorly and a level 7 node was removed.   All lymph nodes were sent for permanent pathology only.  The pleura then was divided at the hilum anteriorly.  The pleura overlying the pulmonary artery at the confluence of the major and minor fissures was incised with cautery. The basilar segmental branch to the lower lobe was identified. Dissection was carried along in the fissure, identifying the posterior right upper lobe vein as well as the superior segmental pulmonary artery branch to the lower lobe.  The posterior ascending branch was identified.  Finally, it was directly beneath the vein.  The vein was encircled and divided with the endoscopic stapler.  The remainder of the major fissure was completed with sequential firings of the Echelon stapler using gold cartridges.  A level 12 node was present along the posterior aspect of the posterior ascending branch and this was dissected off.  The posterior ascending branch then was encircled and divided with the endoscopic vascular stapler.  The lung then was retracted posteriorly exposing the pulmonary veins.  The middle lobe vein was identified and preserved.  The upper lobe component of the superior pulmonary vein was dissected out, encircled and divided with the endoscopic stapler.  A second incision was made anterior to the first.  This was a port incision for placement of the stapler.  The dissection then was carried more superiorly and a large anterior upper lobe branch was identified.  An adjacent node was removed.  The artery then was encircled and divided with the vascular stapler.  There was a second apical branch posterior to this, which was more difficult to isolate, but it was dissected out and divided with a stapler as well. The minor fissure then was completed with sequential firings of the Echelon stapler using both gold and green cartridges.  The right upper lobe bronchus now was isolated.  A stapler with a green cartridge was placed across the right upper lobe  bronchus at its origin.  It was closed.  A test inflation showed good aeration of the lower and middle lobes.  The stapler was fired transecting the upper lobe bronchus.  The stapler was placed into an endoscopic retrieval bag, removed and sent for permanent pathology.  The azygos vein was elevated and there was relatively little mediastinal fat and no paratracheal nodes could be found.  An On-Q local anesthetic catheter was placed through a separate stab incision posteriorly and tunneled into a subpleural location.  It was primed with 5 mL of 0.5% Marcaine while awaiting the frozen sections.  A 28-French chest tube was placed through the anterior port incision.  The lower and middle lobes were tacked together with a single firing of an Echelon stapler.  The lower and middle lobes then were reinflated.  The scope was removed.  The remaining port incision was closed with a #1 Vicryl fascial suture and a subcuticular suture.  The  working incision was closed with #1 Vicryl fascial suture.  The subcutaneous tissue and skin were closed in a standard fashion.  The chest tube was placed to suction.  The patient was placed back in a supine position.  She was then extubated in the operating room and taken to the postanesthetic care unit in good condition.     Revonda Standard Roxan Hockey, M.D.     SCH/MEDQ  D:  08/20/2017  T:  08/20/2017  Job:  [id number]

## 2017-08-20 NOTE — Brief Op Note (Addendum)
08/20/2017  4:37 PM  PATIENT:  Heather [name]  46 y.o. female  PRE-OPERATIVE DIAGNOSIS:  Right Upper Lobe Lung Nodule  POST-OPERATIVE DIAGNOSIS:  Non-small cell carcinoma- Clinical stage IA  PROCEDURE:  Procedure(s):  RIGHT VIDEO ASSISTED THORACOSCOPY (VATS)/ WEDGE RESECTION (Right) RIGHT UPPER LOBE LOBECTOMY (Right) LYMPH NODE DISSECTION (N/A)  SURGEON:  Surgeon(s) and Role:    * [name], MD - Primary  PHYSICIAN ASSISTANT: Jadene Pierini PA-C  ANESTHESIA:   general  EBL:  150 mL   BLOOD ADMINISTERED:none  DRAINS: 28 Straight Chest Tube Right Chest   LOCAL MEDICATIONS USED:  MARCAINE     SPECIMEN:  Source of Specimen:  Wedge Resection RUL x 2, completion lobectomy RUL, Lymph node sampling  DISPOSITION OF SPECIMEN:  PATHOLOGY  COUNTS:  YES  TOURNIQUET:  * No tourniquets in log *  DICTATION: .Dragon Dictation  PLAN OF CARE: Admit to inpatient   PATIENT DISPOSITION:  ICU - extubated and stable.   Delay start of Pharmacological VTE agent (>24hrs) due to surgical blood loss or risk of bleeding: yes

## 2017-08-20 NOTE — Interval H&P Note (Signed)
History and Physical Interval Note:  08/20/2017 12:22 PM  Heather [name]  has presented today for surgery, with the diagnosis of Right Upper Lobe Lung Nodule  The various methods of treatment have been discussed with the patient and family. After consideration of risks, benefits and other options for treatment, the patient has consented to  Procedure(s): RIGHT VIDEO ASSISTED THORACOSCOPY (VATS)/WEDGE RESECTION, POSSIBLE LOBECTOMY (Right) LOBECTOMY (Right) as a surgical intervention .  The patient's history has been reviewed, patient examined, no change in status, stable for surgery.  I have reviewed the patient's chart and labs.  Questions were answered to the patient's satisfaction.     [name]

## 2017-08-20 NOTE — Anesthesia Procedure Notes (Signed)
Procedure Name: Intubation Date/Time: 08/20/2017 1:16 PM Performed by: Verdie Drown, CRNA Pre-anesthesia Checklist: Patient identified, Emergency Drugs available, Suction available and Patient being monitored Patient Re-evaluated:Patient Re-evaluated prior to induction Oxygen Delivery Method: Circle system utilized Preoxygenation: Pre-oxygenation with 100% oxygen Induction Type: IV induction Ventilation: Mask ventilation without difficulty Laryngoscope Size: 3 and Mac Grade View: Grade I Endobronchial tube: Left, Double lumen EBT, EBT position confirmed by auscultation and EBT position confirmed by fiberoptic bronchoscope and 35 Fr Number of attempts: 1 Airway Equipment and Method: Stylet and Fiberoptic brochoscope Placement Confirmation: ETT inserted through vocal cords under direct vision,  positive ETCO2 and breath sounds checked- equal and bilateral Tube secured with: Tape Dental Injury: Teeth and Oropharynx as per pre-operative assessment

## 2017-08-20 NOTE — Anesthesia Procedure Notes (Signed)
Arterial Line Insertion Start/End11/19/2018 11:45 AM, 08/20/2017 12:04 PM Performed by: Josephine Igo, CRNA, CRNA  Patient location: Pre-op. Preanesthetic checklist: patient identified, IV checked, site marked, risks and benefits discussed, surgical consent, monitors and equipment checked, pre-op evaluation, timeout performed and anesthesia consent Lidocaine 1% used for infiltration and patient sedated Left, radial was placed Catheter size: 20 G Hand hygiene performed , maximum sterile barriers used  and Seldinger technique used Allen's test indicative of satisfactory collateral circulation Attempts: 1 Procedure performed without using ultrasound guided technique. Following insertion, dressing applied and Biopatch. Post procedure assessment: normal  Patient tolerated the procedure well with no immediate complications.

## 2017-08-20 NOTE — Anesthesia Procedure Notes (Signed)
Central Venous Catheter Insertion Performed by: Roberts Gaudy, MD, anesthesiologist Start/End11/19/2018 11:20 AM, 08/20/2017 11:30 AM Patient location: Pre-op. Preanesthetic checklist: patient identified, IV checked, site marked, risks and benefits discussed, surgical consent, monitors and equipment checked, pre-op evaluation, timeout performed and anesthesia consent Lidocaine 1% used for infiltration and patient sedated Hand hygiene performed  and maximum sterile barriers used  Catheter size: 8 Fr Total catheter length 16. Central line was placed.Double lumen Procedure performed using ultrasound guided technique. Ultrasound Notes:image(s) printed for medical record Attempts: 1 Following insertion, dressing applied and line sutured. Post procedure assessment: blood return through all ports  Patient tolerated the procedure well with no immediate complications.

## 2017-08-20 NOTE — Transfer of Care (Signed)
Immediate Anesthesia Transfer of Care Note  Patient: Heather [name]  Procedure(s) Performed: RIGHT VIDEO ASSISTED THORACOSCOPY (VATS)/WEDGE RESECTION (Right Chest) RIGHT UPPER LOBE LOBECTOMY (Right ) LYMPH NODE DISSECTION (N/A Chest)  Patient Location: PACU  Anesthesia Type:General  Level of Consciousness: awake, alert , oriented and patient cooperative  Airway & Oxygen Therapy: Patient Spontanous Breathing and Patient connected to face mask oxygen  Post-op Assessment: Report given to RN, Post -op Vital signs reviewed and stable and Patient moving all extremities X 4  Post vital signs: Reviewed and stable  Last Vitals:  Vitals:   08/20/17 1110 08/20/17 1115  BP:  126/70  Pulse: 62 79  Resp: (!) 8 11  Temp:    SpO2: 100% 100%    Last Pain:  Vitals:   08/20/17 1014  TempSrc: Oral      Patients Stated Pain Goal: 3 (85/88/50 2774)  Complications: No apparent anesthesia complications

## 2017-08-21 ENCOUNTER — Encounter (HOSPITAL_COMMUNITY): Payer: Self-pay

## 2017-08-21 ENCOUNTER — Other Ambulatory Visit: Payer: Self-pay

## 2017-08-21 ENCOUNTER — Encounter: Payer: Self-pay | Admitting: *Deleted

## 2017-08-21 ENCOUNTER — Inpatient Hospital Stay (HOSPITAL_COMMUNITY): Payer: 59

## 2017-08-21 LAB — CBC
HCT: 34.8 % — ABNORMAL LOW (ref 36.0–46.0)
HEMOGLOBIN: 11.6 g/dL — AB (ref 12.0–15.0)
MCH: 31.6 pg (ref 26.0–34.0)
MCHC: 33.3 g/dL (ref 30.0–36.0)
MCV: 94.8 fL (ref 78.0–100.0)
Platelets: 99 10*3/uL — ABNORMAL LOW (ref 150–400)
RBC: 3.67 MIL/uL — AB (ref 3.87–5.11)
RDW: 13.5 % (ref 11.5–15.5)
WBC: 11.9 10*3/uL — ABNORMAL HIGH (ref 4.0–10.5)

## 2017-08-21 LAB — BASIC METABOLIC PANEL
Anion gap: 5 (ref 5–15)
BUN: 7 mg/dL (ref 6–20)
CHLORIDE: 107 mmol/L (ref 101–111)
CO2: 24 mmol/L (ref 22–32)
Calcium: 8.2 mg/dL — ABNORMAL LOW (ref 8.9–10.3)
Creatinine, Ser: 0.54 mg/dL (ref 0.44–1.00)
GFR calc non Af Amer: >60 mL/min (ref >60)
Glucose, Bld: 150 mg/dL — ABNORMAL HIGH (ref 65–99)
POTASSIUM: 3.7 mmol/L (ref 3.5–5.1)
SODIUM: 136 mmol/L (ref 135–145)

## 2017-08-21 LAB — GLUCOSE, CAPILLARY
GLUCOSE-CAPILLARY: 183 mg/dL — AB (ref 65–99)
Glucose-Capillary: 91 mg/dL (ref 65–99)
Glucose-Capillary: 110 mg/dL — ABNORMAL HIGH (ref 65–99)

## 2017-08-21 MED ORDER — KETOROLAC TROMETHAMINE 15 MG/ML IJ SOLN
15.0000 mg | Freq: Four times a day (QID) | INTRAMUSCULAR | Status: AC
  Administered 2017-08-21 – 2017-08-22 (×7): 15 mg via INTRAVENOUS
  Filled 2017-08-21 (×7): qty 1

## 2017-08-21 MED ORDER — GUAIFENESIN ER 600 MG PO TB12
600.0000 mg | ORAL_TABLET | Freq: Two times a day (BID) | ORAL | Status: AC
  Administered 2017-08-21 – 2017-08-23 (×6): 600 mg via ORAL
  Filled 2017-08-21 (×6): qty 1

## 2017-08-21 NOTE — Progress Notes (Signed)
While ambulating patient it was discovered that patients indwelling Foley catheter was leaking. Notified MD of policy to remove catheter if leaking, per order foley to be removed tomorrow but due to leaking decision was made to remove foley today.

## 2017-08-21 NOTE — Progress Notes (Signed)
Oncology Nurse Navigator Documentation  Oncology Nurse Navigator Flowsheets 08/21/2017  Navigator Location [facility]  Navigator Encounter Type Telephone/Ms. Georgia had her surgery yesterday.  I called to check on her.  I spoke with her.  I listened as she explained how she was feeling.  She states she is feeling well, just sore.  I asked about her son. He is staying with family for now and is doing ok.  I encouraged her and hoped she would be out of the hospital soon.  She was thankful for the call.   Surgery Date 08/20/2017  Patient Visit Type Inpatient  Treatment Phase Treatment  Barriers/Navigation Needs Education  Education Other  Interventions Education  Education Method Verbal  Acuity Level 1  Time Spent with Patient 30

## 2017-08-21 NOTE — Progress Notes (Signed)
1 Day Post-Op Procedure(s) (LRB): RIGHT VIDEO ASSISTED THORACOSCOPY (VATS)/WEDGE RESECTION (Right) RIGHT UPPER LOBE LOBECTOMY (Right) LYMPH NODE DISSECTION (N/A) Subjective: Some incisional pain, denies nausea  Objective: Vital signs in last 24 hours: Temp:  [97 F (36.1 C)-98.6 F (37 C)] 98.6 F (37 C) (11/20 0741) Pulse Rate:  [58-83] 70 (11/20 0741) Cardiac Rhythm: Normal sinus rhythm (11/20 0700) Resp:  [8-20] 20 (11/20 0741) BP: (88-126)/(62-92) 112/82 (11/20 0741) SpO2:  [95 %-100 %] 99 % (11/20 0741) Arterial Line BP: (114-141)/(61-83) 130/72 (11/20 0741) Weight:  [103 lb 13.4 oz (47.1 kg)] 103 lb 13.4 oz (47.1 kg) (11/20 0622)  Hemodynamic parameters for last 24 hours:    Intake/Output from previous day: 11/19 0701 - 11/20 0700 In: 2455 [P.O.:480; I.V.:1875; IV Piggyback:100] Out: 5465 [Urine:835; Blood:150; Chest Tube:390] Intake/Output this shift: No intake/output data recorded.  General appearance: alert, cooperative and no distress Neurologic: intact Heart: regular rate and rhythm Lungs: diminished breath sounds right base small air leak  Lab Results: Recent Labs    08/21/17 0220  WBC 11.9*  HGB 11.6*  HCT 34.8*  PLT 99*   BMET:  Recent Labs    08/21/17 0220  NA 136  K 3.7  CL 107  CO2 24  GLUCOSE 150*  BUN 7  CREATININE 0.54  CALCIUM 8.2*    PT/INR: No results for input(s): LABPROT, INR in the last 72 hours. ABG    Component Value Date/Time   PHART 7.397 08/17/2017 1425   HCO3 23.0 08/17/2017 1425   TCO2 27 11/30/2009 1214   ACIDBASEDEF 1.2 08/17/2017 1425   O2SAT 96.8 08/17/2017 1425   CBG (last 3)  Recent Labs    08/21/17 0749  GLUCAP 183*    Assessment/Plan: S/P Procedure(s) (LRB): RIGHT VIDEO ASSISTED THORACOSCOPY (VATS)/WEDGE RESECTION (Right) RIGHT UPPER LOBE LOBECTOMY (Right) LYMPH NODE DISSECTION (N/A) -POD # 1 overall doing well  CV- stable- dc A line  RESP- IS for atelectasis  No CXR this AM- will  order  Small air leak, serosanguinous drainage from CT- keep in suction today  RENAL- creatinine and lytes OK  ENDO_ CBG elevated this AM on D5, follow  SCD + enoxaparin for DVT prophylaxis- has Factor V Leyden deficiency  Thrombocytopenia- follow closely   LOS: 1 day    [name] 08/21/2017

## 2017-08-21 NOTE — Plan of Care (Signed)
Care plan updated.

## 2017-08-22 ENCOUNTER — Inpatient Hospital Stay (HOSPITAL_COMMUNITY): Payer: 59

## 2017-08-22 LAB — COMPREHENSIVE METABOLIC PANEL
ALT: 13 U/L — AB (ref 14–54)
AST: 23 U/L (ref 15–41)
Albumin: 3.1 g/dL — ABNORMAL LOW (ref 3.5–5.0)
Alkaline Phosphatase: 31 U/L — ABNORMAL LOW (ref 38–126)
Anion gap: 5 (ref 5–15)
BUN: 6 mg/dL (ref 6–20)
CHLORIDE: 107 mmol/L (ref 101–111)
CO2: 27 mmol/L (ref 22–32)
Calcium: 8.4 mg/dL — ABNORMAL LOW (ref 8.9–10.3)
Creatinine, Ser: 0.6 mg/dL (ref 0.44–1.00)
GFR calc Af Amer: >60 mL/min (ref >60)
Glucose, Bld: 90 mg/dL (ref 65–99)
POTASSIUM: 3.5 mmol/L (ref 3.5–5.1)
SODIUM: 139 mmol/L (ref 135–145)
Total Bilirubin: 0.2 mg/dL — ABNORMAL LOW (ref 0.3–1.2)
Total Protein: 4.8 g/dL — ABNORMAL LOW (ref 6.5–8.1)

## 2017-08-22 LAB — CBC
HCT: 32.7 % — ABNORMAL LOW (ref 36.0–46.0)
Hemoglobin: 10.7 g/dL — ABNORMAL LOW (ref 12.0–15.0)
MCH: 31 pg (ref 26.0–34.0)
MCHC: 32.7 g/dL (ref 30.0–36.0)
MCV: 94.8 fL (ref 78.0–100.0)
PLATELETS: 86 10*3/uL — AB (ref 150–400)
RBC: 3.45 MIL/uL — AB (ref 3.87–5.11)
RDW: 13.6 % (ref 11.5–15.5)
WBC: 6.1 10*3/uL (ref 4.0–10.5)

## 2017-08-22 MED ORDER — LEVALBUTEROL HCL 0.63 MG/3ML IN NEBU: 0.6300 mg | INHALATION_SOLUTION | Freq: Four times a day (QID) | RESPIRATORY_TRACT | Status: DC | PRN

## 2017-08-22 MED ORDER — POTASSIUM CHLORIDE CRYS ER 20 MEQ PO TBCR
20.0000 meq | EXTENDED_RELEASE_TABLET | Freq: Two times a day (BID) | ORAL | Status: AC
  Administered 2017-08-22 (×2): 20 meq via ORAL
  Filled 2017-08-22 (×2): qty 1

## 2017-08-22 NOTE — Discharge Summary (Signed)
Physician Discharge Summary  Patient ID: Heather [name] MRN: [id number] DOB/AGE: 1971/07/23 46 y.o.  Admit date: 08/20/2017 Discharge date: 08/28/2017  Admission Diagnoses:  Patient Active Problem List   Diagnosis Date Noted  . Pulmonary nodule 07/16/2017  . Cervical adenopathy 07/04/2017  . Abdominal pain 01/02/2017  . Factor V Leiden ([place]) 04/13/2015  . Prediabetes 04/13/2015  . History of colon polyps 04/13/2015  . H/O drug rash due to contrast  05/14/2014  . Loss of weight 04/02/2014  . Current smoker 04/02/2014  . Routine health maintenance 04/02/2014  . History of anemia 05/08/2007   Discharge Diagnoses: Stage IA adenocarcinoma of the lung  Patient Active Problem List   Diagnosis Date Noted  . S/P lobectomy of lung 08/20/2017  . Pulmonary nodule 07/16/2017  . Cervical adenopathy 07/04/2017  . Abdominal pain 01/02/2017  . Factor V Leiden ([place]) 04/13/2015  . Prediabetes 04/13/2015  . History of colon polyps 04/13/2015  . H/O drug rash due to contrast  05/14/2014  . Loss of weight 04/02/2014  . Current smoker 04/02/2014  . Routine health maintenance 04/02/2014  . History of anemia 05/08/2007   Discharged Condition: good  History of Present Illness:  [name] is a 46 yo white female with history of nicotine abuse, factor V Leiden deficiency, COPD, and anemia.  She smoked 2 ppd until recently.  She continues to smoke but is down to 3/4 ppd.  She had been in her normal state of health until the past 6 months.  She developed poor appetite and early satiety.  She has lost about 26 lbs and most of that has been over the past 3 months.  She noticed some nodules in her neck and presented to her PCP.  She ordered a full workup to evaluate the patient's weight loss.  CXR showed a RUL nodule.  CT scan of the chest was performed and confirmed a 1 cm nodule in the RUL.  PET CT scan showed the nodule to be hypermetabolic.  She was referred to [facility] and evaluated by Dr. Roxan Hockey  at which time she admitted to being short of breath if she walks long distances at a rapid pace.  She denied hemoptysis, wheezing, but does have an occasional cough with clear mucous.  It was felt the patient should undergo VATS with resection for definitive diagnosis after discussion at the oncology conference.  The risks and benefits of the procedure were explained to the patient and she was agreeable to proceed.   Hospital Course:   Heather [name] presented to [facility] on 08/20/2017.  She was taken to the operating room and underwent Right Video Assisted Thoracoscopy with wedge resection of RUL, completion lobectomy of Right upper lobe, and lymph node dissection.   She tolerated the procedure, was extubated, and taken to the PACU in stable condition.  POD #1 the patient's chest tube showed an air leak.  It remained on suction.  Her arterial line was removed without difficulty.  She was started on Lovenox for DVT prophylaxis.  POD #2 her chest tube was transitioned to water seal.  Follow up CXR showed increase in pneumothorax/space and she was transitioned back to suction on POD #3.  Follow up CXR showed improvement of pneumothorax/space.  There was no evidence of air leak and she was transitioned back to water seal.  Her follow up CXR remained stable but she continued to have an air leak, so she will be discharged with a mini-express.   She developed thrombocytopenia.  Her lovenox was discontinued and a HIT panel was obtained and was normal.  She is ambulating independently.  Her pain is well controlled.  She is tolerating a regular diet.  She is medically stable for discharge home today.         Significant Diagnostic Studies: nuclear medicine:   1. Hypermetabolic 14 mm pulmonary nodule right upper lobe consistent with neoplasm. No evidence for distant hypermetabolic disease in the neck, chest, abdomen, or pelvis. 2.  Emphysema. (ICD10-J43.9) 3.  Aortic Atherosclerois  (ICD10-170.0)  Treatments: surgery:   Right video-assisted thoracoscopy, wedge resection, right upper lobe x2, thoracoscopic right upper lobectomy, mediastinal lymph node dissection, On-Q local anesthetic catheter placement.  Disposition: 01-Home or Self Care   Discharge Medications:  Discharge Instructions    Discharge patient   Complete by:  As directed    Discharge disposition:  01-Home or Self Care   Discharge patient date:  08/28/2017     Allergies as of 08/28/2017      Reactions   Iohexol Swelling, Other (See Comments)   Updated to actual drug allergy   Aspirin Nausea And Vomiting      Medication List    STOP taking these medications   buPROPion 150 MG 12 hr tablet Commonly known as:  WELLBUTRIN SR   diphenhydrAMINE 25 MG tablet Commonly known as:  BENADRYL   omeprazole 20 MG capsule Commonly known as:  PRILOSEC     TAKE these medications   oxyCODONE 5 MG immediate release tablet Commonly known as:  Oxy IR/ROXICODONE Take 1-2 tablets (5-10 mg total) by mouth every 6 (six) hours as needed for severe pain.      Follow-up Information    [name], MD Follow up on 09/11/2017.   Specialty:  Cardiothoracic Surgery Why:  Appointment is at 4:00, please get CXR at 3:30 at [facility] located on first  Contact information: [address] [place] [address] [phone]          FINAL DIAGNOSIS Diagnosis 1. Lung, wedge biopsy/resection, right upper lobe FIBROSIS WITH SMALL FOCUS OF ATYPICAL ADENOMATOUS HYPERPLASIA NEGATIVE FOR INVASIVE CARCINOMA 2. Lung, wedge biopsy/resection, right upper lobe #2 MODERATELY DIFFERENTIATED ADENOCARCINOMA OF THE LUNG, GRADE 2, SIZE 1.0 CM THE TUMOR IS LIMITED TO LUNG AND WITHIN 1 MM FROM VISCERAL PLEURAL (PT1A, PL0) LYMPHOVASCULAR INVASION IS IDENTIFIED ALL MARGINS OF RESECTION ARE NEGATIVE FOR CARCINOMA 3. Lymph node, biopsy, 7 ONE BENIGN LYMPH NODE (0/1) 4. Lymph node, biopsy, 9 ONE  BENIGN LYMPH NODE (0/1) 5. Lymph node, biopsy, 10 ONE BENIGN LYMPH NODE (0/1) 6. Lymph node, biopsy, 12 ONE BENIGN LYMPH NODE (0/1) 7. Lymph node, biopsy, 12 #2 BENIGN FIBROVASCULAR TISSUE 8. Lymph node, biopsy, 11 ONE BENIGN LYMPH NODE (0/1) 9. Lymph node, biopsy, 12 #3 ONE BENIGN LYMPH NODE (0/1) 10. Lung, resection (segmental or lobe), right upper lobe BENIGN LUNG PARENCHYMA WITH REACTIVE CHANGES BRONCHIAL VASCULAR MARGINS ARE NEGATIVE FOR CARCINOMA 11. Lymph node, biopsy, 12 #4 ONE BENIGN LYMPH NODE (0/1) 12. Lymph node, biopsy, 12 #5 BENIGN FIBROUS TISSUE 1 of 4 FINAL for PAISLYNN, HEGSTROM ([id number]) Microscopic Comment 2. LUNG Specimen, including laterality: Right upper lobe Procedure: Wedge resection Specimen integrity (intact/disrupted): Intact Tumor site: Right upper lobe apex Tumor focality: Focal Maximum tumor size (cm): 1.0 cm Histologic type: adenocarcinoma Grade: 2 Margins: Negative Distance to closest margin (cm): 1.4 cm Visceral pleura invasion: Negative (PL0) Tumor extension: limited to lung Treatment effect (if treated with neoadjuvant therapy): Negative Lymph -Vascular  invasion: identified Lymph nodes: Number examined - 7 ; Number N1 nodes positive 0 ; Number N2 nodes positive 0 TNM code: pT1a , pN0 Ancillary Studies: Best tumor block for sendout testing: 2B Non-neoplastic lung: unremarkable Comments: The neoplasm stains positive for TTF-1, negative for ck5/6, supporting the diagnosis of adenocarcinoma of the lung. Part 1: There is small focus of atypical adenomatous hyperplasia identified at the perminent deeper level only. Dr. Saralyn Pilar also reviewed this part and agree. Casimer Lanius MD Pathologist, Electronic Signature (Case signed 08/27/2017) Intraoperative Diagnosis 1. FROZEN SECTION DIAGNOSIS, RIGHT UPPER LOBE LUNG WEDGE: NO MALIGNANCY IDENTIFIED. PER DR BUTLER WITH AGREEMENT FROM DR [facility]. 2. FROZEN SECTION DIAGNOSIS, RIGHT UPPER LOBE LUNG  WEDGE #2: NON-SMALL CELL CARCINOMA. PER DR BUTLER WITH AGREEMENT FROM DR [facility]. Specimen Gross and Clinical Information Specimen(s) Obtained: 1. Lung, wedge biopsy/resection, right upper lobe 2. Lung, wedge biopsy/resection, right upper lobe #2 3. Lymph node, biopsy, 7 4. Lymph node, biopsy, 9 5. Lymph node, biopsy, 10 6. Lymph node, biopsy, 12 7. Lymph node, biopsy, 12 #2 8. Lymph node, biopsy, 11 9. Lymph node, biopsy, 12 #3 2 of 4 FINAL for MICKI, CASSEL ([id number]) Specimen(s) Obtained:(continued) 10. Lung, resection (segmental or lobe), right upper lobe 11. Lymph node, biopsy, 12 #4 12. Lymph node, biopsy, 12 #5 Specimen Clinical Information 1. right upper lobe lung nodule (cm) Gross 1. Received fresh for intraoperative consult is a 5 x 1.7 x 1.5 cm, 4-gram wedge of tan-pink pale lung, with a 6 cm linear staple line at one end. The flora is pale tan-pink, mottled. The staple line is removed and under-inked yellow. The pleural surface is inked black in an area of roughened, firm pleura. The specimen is sectioned to reveal soft parenchyma. A representative section is submitted for frozen section and subsequently submitted in block 1A. Additional sections with perpendicular stapled margin is submitted in block 1B, 1C. 2. Received fresh for intraoperative consult is a 6 x 2.5 x 2 cm, 6-gram wedge of tan-pink lung parenchyma, received with a 6 cm linear staple line at one end. The staple line is removed and under-inked yellow. The specimen is sectioned to reveal a well circumscribed 1 x 1 x 0.9 cm ill-defined, firm, tan mass grossly abutting the pleural surface. The pleural surface is inked black. The mass is 1.4 cm to the stapled margin. A representative section of the mass is submitted for frozen section and subsequently submitted in block 1A. Additional representative sections of the mass to the stapled line are submitted in block 1B, 1C. (AK:ecj 08/20/2017) 3. Received in  saline is a 1.3 x 1.2 x 0.4 cm aggregate of anthracotic lymph node fragments, submitted entirely in one cassette. 4. Received in saline is a 1.5 x 0.5 x 0.3 cm anthracotic lymph node, bisected and submitted entirely in one cassette. 5. Received in saline is a 1.2 x 1 x 0.2 cm aggregate of anthracotic lymph node fragments, submitted entirely in one cassette. 6. Received in saline is a 1 x 1 x 0.5 cm aggregate of anthracotic lymph node fragments, submitted entirely in one cassette. 7. Received in saline is a 0.5 x 0.4 x 0.2 cm roughened anthracotic lymph node fragment, submitted entirely in one cassette. 8. Received in saline is a 1 x 0.5 x 0.5 cm anthracotic lymph node fragment, bisected and submitted entirely in one cassette. 9. Received in saline is a 0.7 x 0.4 x 0.3 cm anthracotic lymph node fragment, submitted entirely in one cassette. 10. Received fresh  and placed in formalin is a 13 x 10 x 4.5 cm, 175 gram lung lobectomy specimen, received with two linear staple lined opposite the bronchial margin averaging 9.5 cm. The stapled are removed and under inked black. The pleural surface is smooth, pink-purple, discrete lesions are not grossly seen. Specimen is sectioned to reveal unremarkable red brown spongy parenchyma. Discrete masses and lesions are not grossly seen. Representative sections are submitted as follows. Block summary: 10A= bronchial and vascular margin en face. 10B,10C= representative opposite staple margins, perpendicular sections. 10D,10E= representative normal parenchyma. 11. Received in saline is a 1.3 x 1 x 0.5 cm anthracotic lymph node, bisected and entirely submitted in one cassette. 12. Received in saline is a 1.2 x 1.2 x 0.3 cm aggregate of anthracotic lymph node fragments, submitted entirely in one cassette. (AK:gt, 08/21/17) 3 of 4 FINAL for SHANECE, COCHRANE ([id number]) Stain(s) used in Diagnosis: The following stain(s) were used in diagnosing the case: CK 5/6,  Thyroid Transcription Factor -1. The control(s) stained appropriately. Disclaimer Some of these immunohistochemical stains may have been developed and the performance characteristics determined by [facility]. Some may not have been cleared or approved by the U.S. Food and Drug Administration. The FDA has determined that such clearance or approval is not necessary. This test is used for clinical purposes. It should not be regarded as investigational or for research. This laboratory is certified under the [place] (CLIA-88) as qualified to perform high complexity clinical laboratory testing. Report signed out from the following location(s) Technical component and interpretation was performed at [facility] [place], [place],  [address]. CLIA #: [id number], 4 of      John Giovanni PA-C 08/28/2017, 8:56 AM

## 2017-08-22 NOTE — Progress Notes (Signed)
2 Days Post-Op Procedure(s) (LRB): RIGHT VIDEO ASSISTED THORACOSCOPY (VATS)/WEDGE RESECTION (Right) RIGHT UPPER LOBE LOBECTOMY (Right) LYMPH NODE DISSECTION (N/A) Subjective: Some discomfort, better than yesterday O/w feels well  Objective: Vital signs in last 24 hours: Temp:  [98 F (36.7 C)-98.4 F (36.9 C)] 98.3 F (36.8 C) (11/21 0742) Pulse Rate:  [62-75] 72 (11/21 0323) Cardiac Rhythm: Normal sinus rhythm (11/21 0700) Resp:  [14-21] 15 (11/21 0803) BP: (100-128)/(69-90) 128/90 (11/21 0742) SpO2:  [98 %-100 %] 100 % (11/21 0803)  Hemodynamic parameters for last 24 hours:    Intake/Output from previous day: 11/20 0701 - 11/21 0700 In: -  Out: 2775 [Urine:2475; Chest Tube:300] Intake/Output this shift: No intake/output data recorded.  General appearance: alert, cooperative and no distress Neurologic: intact Heart: regular rate and rhythm Lungs: clear to auscultation bilaterally Abdomen: normal findings: soft, non-tender minimal air leak  Lab Results: Recent Labs    08/21/17 0220 08/22/17 0500  WBC 11.9* 6.1  HGB 11.6* 10.7*  HCT 34.8* 32.7*  PLT 99* 86*   BMET:  Recent Labs    08/21/17 0220 08/22/17 0500  NA 136 139  K 3.7 3.5  CL 107 107  CO2 24 27  GLUCOSE 150* 90  BUN 7 6  CREATININE 0.54 0.60  CALCIUM 8.2* 8.4*    PT/INR: No results for input(s): LABPROT, INR in the last 72 hours. ABG    Component Value Date/Time   PHART 7.397 08/17/2017 1425   HCO3 23.0 08/17/2017 1425   TCO2 27 11/30/2009 1214   ACIDBASEDEF 1.2 08/17/2017 1425   O2SAT 96.8 08/17/2017 1425   CBG (last 3)  Recent Labs    08/21/17 0749 08/21/17 1312 08/21/17 1637  GLUCAP 183* 91 110*    Assessment/Plan: S/P Procedure(s) (LRB): RIGHT VIDEO ASSISTED THORACOSCOPY (VATS)/WEDGE RESECTION (Right) RIGHT UPPER LOBE LOBECTOMY (Right) LYMPH NODE DISSECTION (N/A) POD # 2 doing well  Looks great PCA + On-Q + toradol for pain control Minimal air leak- CT to water  seal Continue pulmonary hygiene SCD + ambulation for DVT prophylaxis Thrombocytopenia- PLT count down slightly- will dc enoxaparin, check HIT Path pending   LOS: 2 days    [name] 08/22/2017

## 2017-08-23 ENCOUNTER — Inpatient Hospital Stay (HOSPITAL_COMMUNITY): Payer: 59

## 2017-08-23 LAB — CBC
HCT: 35.3 % — ABNORMAL LOW (ref 36.0–46.0)
HEMOGLOBIN: 11.3 g/dL — AB (ref 12.0–15.0)
MCH: 30.4 pg (ref 26.0–34.0)
MCHC: 32 g/dL (ref 30.0–36.0)
MCV: 94.9 fL (ref 78.0–100.0)
PLATELETS: 97 10*3/uL — AB (ref 150–400)
RBC: 3.72 MIL/uL — ABNORMAL LOW (ref 3.87–5.11)
RDW: 13.4 % (ref 11.5–15.5)
WBC: 5.8 10*3/uL (ref 4.0–10.5)

## 2017-08-23 LAB — HEPARIN INDUCED PLATELET AB (HIT ANTIBODY): HEPARIN INDUCED PLT AB: 0.24 {OD_unit} (ref 0.000–0.400)

## 2017-08-23 MED ORDER — PNEUMOCOCCAL VAC POLYVALENT 25 MCG/0.5ML IJ INJ
0.5000 mL | INJECTION | INTRAMUSCULAR | Status: AC | PRN
  Administered 2017-08-28: 0.5 mL via INTRAMUSCULAR
  Filled 2017-08-23: qty 0.5

## 2017-08-23 NOTE — Progress Notes (Addendum)
[name]       ,[place] [address]             [phone]      3 Days Post-Op Procedure(s) (LRB): RIGHT VIDEO ASSISTED THORACOSCOPY (VATS)/WEDGE RESECTION (Right) RIGHT UPPER LOBE LOBECTOMY (Right) LYMPH NODE DISSECTION (N/A) Subjective: Feels pretty well  Objective: Vital signs in last 24 hours: Temp:  [98.2 F (36.8 C)-98.4 F (36.9 C)] 98.4 F (36.9 C) (11/22 0756) Pulse Rate:  [64-78] 66 (11/22 0451) Cardiac Rhythm: Normal sinus rhythm (11/22 0700) Resp:  [13-19] 17 (11/22 0800) BP: (95-129)/(64-91) 127/89 (11/22 0756) SpO2:  [97 %-100 %] 100 % (11/22 0800) Weight:  [99 lb 8 oz (45.1 kg)] 99 lb 8 oz (45.1 kg) (11/22 0543)  Hemodynamic parameters for last 24 hours:    Intake/Output from previous day: 11/21 0701 - 11/22 0700 In: 1776 [P.O.:680; I.V.:1096] Out: 2125 [Urine:2025; Chest Tube:100] Intake/Output this shift: Total I/O In: 240 [P.O.:240] Out: -   General appearance: alert, cooperative and no distress Heart: regular rate and rhythm Lungs: clear to auscultation bilaterally Abdomen: benign Extremities: no edema or calf tenderness Wound: incis healing well  Lab Results: Recent Labs    08/22/17 0500 08/23/17 0418  WBC 6.1 5.8  HGB 10.7* 11.3*  HCT 32.7* 35.3*  PLT 86* 97*   BMET:  Recent Labs    08/21/17 0220 08/22/17 0500  NA 136 139  K 3.7 3.5  CL 107 107  CO2 24 27  GLUCOSE 150* 90  BUN 7 6  CREATININE 0.54 0.60  CALCIUM 8.2* 8.4*    PT/INR: No results for input(s): LABPROT, INR in the last 72 hours. ABG    Component Value Date/Time   PHART 7.397 08/17/2017 1425   HCO3 23.0 08/17/2017 1425   TCO2 27 11/30/2009 1214   ACIDBASEDEF 1.2 08/17/2017 1425   O2SAT 96.8 08/17/2017 1425   CBG (last 3)  Recent Labs    08/21/17 0749 08/21/17 1312 08/21/17 1637  GLUCAP 183* 91 110*    Meds Scheduled Meds: . acetaminophen  1,000 mg Oral Q6H   Or  . acetaminophen (TYLENOL) oral liquid 160 mg/5 mL   1,000 mg Oral Q6H  . bisacodyl  10 mg Oral Daily  . fentaNYL   Intravenous Q4H  . guaiFENesin  600 mg Oral BID  . ketorolac  15 mg Intravenous Q6H  . senna-docusate  1 tablet Oral QHS   Continuous Infusions: . dextrose 5 % and 0.9% NaCl 10 mL/hr at 08/21/17 0909  . potassium chloride     PRN Meds:.diphenhydrAMINE **OR** diphenhydrAMINE, levalbuterol, naloxone **AND** sodium chloride flush, ondansetron (ZOFRAN) IV, oxyCODONE, pneumococcal 23 valent vaccine, potassium chloride, traMADol  Xrays Dg Chest Port 1 View  Result Date: 08/23/2017 CLINICAL DATA:  Lobectomy EXAM: PORTABLE CHEST 1 VIEW COMPARISON:  08/22/2017 FINDINGS: Right chest tube remains in place. 10% right apical pneumothorax is worse. Opacity in the right suprahilar region right apex is stable. Left lung remains clear. Normal heart size. Right jugular central venous catheter stable. There is emphysema in the right supraclavicular region and right axilla which has increased. IMPRESSION: Right chest tube remains in place.  10% right pneumothorax is worse. Stable opacity in the right suprahilar region and right apex. Electronically Signed   By: Marybelle Killings M.D.   On: 08/23/2017 08:52   Dg Chest Port 1 View  Result Date: 08/22/2017 CLINICAL DATA:  Shortness of breath and sore chest following right lung surgery EXAM: PORTABLE  CHEST 1 VIEW COMPARISON:  08/21/2017 FINDINGS: Cardiac shadow is stable. The left lung remains well aerated without focal abnormality. Postsurgical changes are again noted on the right. Stable right pneumothorax is noted. No new focal infiltrate is seen. Right jugular central line is noted in satisfactory position. IMPRESSION: Stable right apical pneumothorax.  Chest tube remains in place. Electronically Signed   By: Inez Catalina M.D.   On: 08/22/2017 09:07    Assessment/Plan: S/P Procedure(s) (LRB): RIGHT VIDEO ASSISTED THORACOSCOPY (VATS)/WEDGE RESECTION (Right) RIGHT UPPER LOBE LOBECTOMY (Right) LYMPH  NODE DISSECTION (N/A)  1 doing well 2 large air leak, pntx/space a bit larger, will put back on suction 3 H/H is improved, platelets improving  4 routine rehab/pulm toilet 5 pca and on q- cont 6 path pending  LOS: 3 days    Heather [name] 08/23/2017  I have seen and examined the patient and agree with the assessment and plan as outlined.  Rexene Alberts, MD 08/23/2017 1:20 PM

## 2017-08-24 ENCOUNTER — Inpatient Hospital Stay (HOSPITAL_COMMUNITY): Payer: 59

## 2017-08-24 ENCOUNTER — Encounter (HOSPITAL_COMMUNITY): Payer: Self-pay | Admitting: Thoracic Surgery (Cardiothoracic Vascular Surgery)

## 2017-08-24 NOTE — Progress Notes (Signed)
4 Days Post-Op Procedure(s) (LRB): RIGHT VIDEO ASSISTED THORACOSCOPY (VATS)/WEDGE RESECTION (Right) RIGHT UPPER LOBE LOBECTOMY (Right) LYMPH NODE DISSECTION (N/A) Subjective: Feels better today. Was having a lot of pain yesterday  Objective: Vital signs in last 24 hours: Temp:  [98.1 F (36.7 C)-99 F (37.2 C)] 98.1 F (36.7 C) (11/23 0800) Pulse Rate:  [63-87] 73 (11/23 0800) Cardiac Rhythm: Normal sinus rhythm (11/23 0701) Resp:  [13-22] 18 (11/23 0800) BP: (107-128)/(66-80) 128/80 (11/23 0439) SpO2:  [97 %-100 %] 100 % (11/23 0800) Weight:  [97 lb 8 oz (44.2 kg)] 97 lb 8 oz (44.2 kg) (11/23 0439)  Hemodynamic parameters for last 24 hours:    Intake/Output from previous day: 11/22 0701 - 11/23 0700 In: 720 [P.O.:580; I.V.:140] Out: 1225 [Urine:1025; Chest Tube:200] Intake/Output this shift: No intake/output data recorded.  General appearance: alert, cooperative and no distress Neurologic: intact Heart: regular rate and rhythm Lungs: clear to auscultation bilaterally no air leak  Lab Results: Recent Labs    08/22/17 0500 08/23/17 0418  WBC 6.1 5.8  HGB 10.7* 11.3*  HCT 32.7* 35.3*  PLT 86* 97*   BMET:  Recent Labs    08/22/17 0500  NA 139  K 3.5  CL 107  CO2 27  GLUCOSE 90  BUN 6  CREATININE 0.60  CALCIUM 8.4*    PT/INR: No results for input(s): LABPROT, INR in the last 72 hours. ABG    Component Value Date/Time   PHART 7.397 08/17/2017 1425   HCO3 23.0 08/17/2017 1425   TCO2 27 11/30/2009 1214   ACIDBASEDEF 1.2 08/17/2017 1425   O2SAT 96.8 08/17/2017 1425   CBG (last 3)  Recent Labs    08/21/17 1312 08/21/17 1637  GLUCAP 91 110*    Assessment/Plan: S/P Procedure(s) (LRB): RIGHT VIDEO ASSISTED THORACOSCOPY (VATS)/WEDGE RESECTION (Right) RIGHT UPPER LOBE LOBECTOMY (Right) LYMPH NODE DISSECTION (N/A) -doing well No air leak with cough this AM, apical space smaller on CXR this AM Will wrap CT site with vasoline gauze Try water seal  again today Continue SCD + ambulation Platelet count a little better today, HIT Ab normal   LOS: 4 days    [name] 08/24/2017

## 2017-08-24 NOTE — Progress Notes (Signed)
Pt noted to have +5 air leak with cough with chest tube to water seal and +2 air leak noted without cough upon assessment and noted subcutaneous air palpated in upper right side of chest.  Dressing to chest tube dry and intact.  Tubing secured and intact.  Dr. Roxy Manns with [facility] notified.  Pt RR 18 regular and unlabored.  O2 sat 97% on RA.    Pt denies any increase in pain.  Order received to place patient on -20cm suction.  Pt placed on suction per order.

## 2017-08-24 NOTE — Anesthesia Postprocedure Evaluation (Signed)
Anesthesia Post Note  Patient: Heather [name]  Procedure(s) Performed: RIGHT VIDEO ASSISTED THORACOSCOPY (VATS)/WEDGE RESECTION (Right Chest) RIGHT UPPER LOBE LOBECTOMY (Right ) LYMPH NODE DISSECTION (N/A Chest)     Patient location during evaluation: Nursing Unit Anesthesia Type: General Level of consciousness: awake, awake and alert and oriented Pain management: pain level controlled Vital Signs Assessment: post-procedure vital signs reviewed and stable Respiratory status: spontaneous breathing, nonlabored ventilation and respiratory function stable Cardiovascular status: blood pressure returned to baseline Postop Assessment: no headache Anesthetic complications: no    Last Vitals:  Vitals:   08/24/17 0800 08/24/17 1221  BP:    Pulse: 73   Resp: 18 15  Temp: 36.7 C   SpO2: 100% 100%    Last Pain:  Vitals:   08/24/17 1221  TempSrc:   PainSc: 2                  [name]

## 2017-08-25 ENCOUNTER — Inpatient Hospital Stay (HOSPITAL_COMMUNITY): Payer: 59

## 2017-08-25 NOTE — Progress Notes (Addendum)
[address]       [place] [address]             [phone]      5 Days Post-Op Procedure(s) (LRB): RIGHT VIDEO ASSISTED THORACOSCOPY (VATS)/WEDGE RESECTION (Right) RIGHT UPPER LOBE LOBECTOMY (Right) LYMPH NODE DISSECTION (N/A) Subjective: Feels well overall  Objective: Vital signs in last 24 hours: Temp:  [98.2 F (36.8 C)-98.8 F (37.1 C)] 98.4 F (36.9 C) (11/24 0800) Pulse Rate:  [75-82] 75 (11/24 0800) Cardiac Rhythm: Normal sinus rhythm (11/24 0700) Resp:  [13-20] 20 (11/24 0800) BP: (109-124)/(66-85) 124/76 (11/24 0800) SpO2:  [96 %-100 %] 99 % (11/24 0800)  Hemodynamic parameters for last 24 hours:    Intake/Output from previous day: 11/23 0701 - 11/24 0700 In: 450 [P.O.:240; I.V.:210] Out: 2615 [Urine:2575; Chest Tube:40] Intake/Output this shift: No intake/output data recorded.  General appearance: alert, cooperative and no distress Heart: regular rate and rhythm Lungs: clear to auscultation bilaterally Abdomen: benign Extremities: no edema or calf tenderness Wound: incis healing well  Lab Results: Recent Labs    08/23/17 0418  WBC 5.8  HGB 11.3*  HCT 35.3*  PLT 97*   BMET: No results for input(s): NA, K, CL, CO2, GLUCOSE, BUN, CREATININE, CALCIUM in the last 72 hours.  PT/INR: No results for input(s): LABPROT, INR in the last 72 hours. ABG    Component Value Date/Time   PHART 7.397 08/17/2017 1425   HCO3 23.0 08/17/2017 1425   TCO2 27 11/30/2009 1214   ACIDBASEDEF 1.2 08/17/2017 1425   O2SAT 96.8 08/17/2017 1425   CBG (last 3)  No results for input(s): GLUCAP in the last 72 hours.  Meds Scheduled Meds: . acetaminophen  1,000 mg Oral Q6H   Or  . acetaminophen (TYLENOL) oral liquid 160 mg/5 mL  1,000 mg Oral Q6H  . bisacodyl  10 mg Oral Daily  . fentaNYL   Intravenous Q4H  . senna-docusate  1 tablet Oral QHS   Continuous Infusions: . dextrose 5 % and 0.9% NaCl 10 mL/hr at 08/21/17 0909  . potassium  chloride     PRN Meds:.diphenhydrAMINE **OR** diphenhydrAMINE, levalbuterol, naloxone **AND** sodium chloride flush, ondansetron (ZOFRAN) IV, oxyCODONE, pneumococcal 23 valent vaccine, potassium chloride, traMADol  Xrays Dg Chest Port 1 View  Result Date: 08/25/2017 CLINICAL DATA:  Status post right upper lobectomy. EXAM: PORTABLE CHEST 1 VIEW COMPARISON:  08/24/2017 FINDINGS: Sequelae of right upper lobectomy are again identified. A right jugular catheter terminates over the lower SVC and a right chest tube remains in place, both unchanged in position. The cardiomediastinal silhouette is unchanged. A small right apical pneumothorax and right upper lung atelectasis are unchanged. The left lung remains clear. No sizable pleural effusion is seen. Soft tissue emphysema is again noted in the right chest wall extending into the neck. IMPRESSION: Postsurgical changes with unchanged small right apical pneumothorax and adjacent atelectasis. Electronically Signed   By: Logan Bores M.D.   On: 08/25/2017 06:41   Dg Chest Port 1 View  Result Date: 08/24/2017 CLINICAL DATA:  Surgery follow-up. EXAM: PORTABLE CHEST 1 VIEW COMPARISON:  08/23/2017. FINDINGS: Right IJ line and right chest tube in stable position. Heart size stable. Postsurgical changes right lung with atelectatic changes in the right upper lung again noted. Slight improvement of right apical pneumothorax. Right chest wall in neck subcutaneous emphysema stable . IMPRESSION: 1. Right IJ line and right chest tube in stable position. 2. Postsurgical changes right lung with atelectatic changes  in the right upper lung again noted. Slight improvement of right apical pneumothorax. Right chest wall and neck subcutaneous emphysema is stable. Electronically Signed   By: Marcello Moores  Register   On: 08/24/2017 06:14    Assessment/Plan: S/P Procedure(s) (LRB): RIGHT VIDEO ASSISTED THORACOSCOPY (VATS)/WEDGE RESECTION (Right) RIGHT UPPER LOBE LOBECTOMY (Right) LYMPH  NODE DISSECTION (N/A)  1 moderate air leak, cont small stable space/pntx- keep chest tube same for now 2 routine pulm toilet/rehab 3 hemodyn stable in sinus rhythm, no fevers 4 no new labs 5 path still pending  LOS: 5 days    John Giovanni 08/25/2017    I have seen and examined the patient and agree with the assessment and plan as outlined.  Patient developed SOB and increased subQ emphysema yesterday after chest tube placed to water seal.  Will leave tube on suction for now.  Air leak persists  Rexene Alberts, MD 08/25/2017 9:57 AM

## 2017-08-25 NOTE — Progress Notes (Addendum)
Wasted 1 mL of Fentnyl in the pyxis with Bethena Roys, Therapist, sports. Discarded in sharps.  Tressie Ellis, RN

## 2017-08-25 NOTE — Progress Notes (Signed)
Pt still noted to have +5 air leak.  Dressing changed and tubing reassessed to ensure connections tight.  Air leak continues.  [place] pleur-evac changed.  After pleur-evac collection device changed airleak decreased from +5 to +1.

## 2017-08-26 ENCOUNTER — Inpatient Hospital Stay (HOSPITAL_COMMUNITY): Payer: 59

## 2017-08-26 NOTE — Progress Notes (Addendum)
      [address]       [place] [address]             [phone]      6 Days Post-Op Procedure(s) (LRB): RIGHT VIDEO ASSISTED THORACOSCOPY (VATS)/WEDGE RESECTION (Right) RIGHT UPPER LOBE LOBECTOMY (Right) LYMPH NODE DISSECTION (N/A) Subjective: Says she feels a lot better today  Objective: Vital signs in last 24 hours: Temp:  [98.2 F (36.8 C)-98.6 F (37 C)] 98.2 F (36.8 C) (11/25 0742) Pulse Rate:  [76-100] 100 (11/25 0742) Cardiac Rhythm: Normal sinus rhythm (11/25 0742) Resp:  [14-24] 18 (11/25 0741) BP: (95-117)/(63-86) 108/77 (11/25 0742) SpO2:  [97 %-100 %] 100 % (11/25 0741)  Hemodynamic parameters for last 24 hours:    Intake/Output from previous day: 11/24 0701 - 11/25 0700 In: 1490 [P.O.:1200; I.V.:290] Out: 947 [Urine:850; Stool:1; Chest Tube:96] Intake/Output this shift: Total I/O In: 300 [P.O.:300] Out: -   General appearance: alert, cooperative and no distress Heart: regular rate and rhythm Lungs: clear to auscultation bilaterally Abdomen: benign Extremities: no edema or calf tenderness Wound: incis healing wel  Lab Results: No results for input(s): WBC, HGB, HCT, PLT in the last 72 hours. BMET: No results for input(s): NA, K, CL, CO2, GLUCOSE, BUN, CREATININE, CALCIUM in the last 72 hours.  PT/INR: No results for input(s): LABPROT, INR in the last 72 hours. ABG    Component Value Date/Time   PHART 7.397 08/17/2017 1425   HCO3 23.0 08/17/2017 1425   TCO2 27 11/30/2009 1214   ACIDBASEDEF 1.2 08/17/2017 1425   O2SAT 96.8 08/17/2017 1425   CBG (last 3)  No results for input(s): GLUCAP in the last 72 hours.  Meds Scheduled Meds: . bisacodyl  10 mg Oral Daily  . fentaNYL   Intravenous Q4H  . senna-docusate  1 tablet Oral QHS   Continuous Infusions: . dextrose 5 % and 0.9% NaCl 10 mL/hr at 08/25/17 2135  . potassium chloride     PRN Meds:.diphenhydrAMINE **OR** diphenhydrAMINE, levalbuterol, naloxone **AND** sodium  chloride flush, ondansetron (ZOFRAN) IV, oxyCODONE, pneumococcal 23 valent vaccine, potassium chloride, traMADol  Xrays Dg Chest Port 1 View  Result Date: 08/25/2017 CLINICAL DATA:  Status post right upper lobectomy. EXAM: PORTABLE CHEST 1 VIEW COMPARISON:  08/24/2017 FINDINGS: Sequelae of right upper lobectomy are again identified. A right jugular catheter terminates over the lower SVC and a right chest tube remains in place, both unchanged in position. The cardiomediastinal silhouette is unchanged. A small right apical pneumothorax and right upper lung atelectasis are unchanged. The left lung remains clear. No sizable pleural effusion is seen. Soft tissue emphysema is again noted in the right chest wall extending into the neck. IMPRESSION: Postsurgical changes with unchanged small right apical pneumothorax and adjacent atelectasis. Electronically Signed   By: Logan Bores M.D.   On: 08/25/2017 06:41    Assessment/Plan: S/P Procedure(s) (LRB): RIGHT VIDEO ASSISTED THORACOSCOPY (VATS)/WEDGE RESECTION (Right) RIGHT UPPER LOBE LOBECTOMY (Right) LYMPH NODE DISSECTION (N/A)  1 doing well 2 path pending 3 smaller air leak today, check CXR  4 cont pulm toilet and routine rehab   LOS: 6 days    John Giovanni 08/26/2017  I have seen and examined the patient and agree with the assessment and plan as outlined.   CXR looks good.  Will try water seal again.  Rexene Alberts, MD 08/26/2017 10:15 AM

## 2017-08-27 ENCOUNTER — Inpatient Hospital Stay (HOSPITAL_COMMUNITY): Payer: 59

## 2017-08-27 MED ORDER — SODIUM CHLORIDE 0.9% FLUSH: 10.0000 mL | INTRAVENOUS | Status: DC | PRN

## 2017-08-27 NOTE — Progress Notes (Signed)
7 Days Post-Op Procedure(s) (LRB): RIGHT VIDEO ASSISTED THORACOSCOPY (VATS)/WEDGE RESECTION (Right) RIGHT UPPER LOBE LOBECTOMY (Right) LYMPH NODE DISSECTION (N/A) Subjective: Frustrated with air leak, o/w feels well  Objective: Vital signs in last 24 hours: Temp:  [98.5 F (36.9 C)-99.2 F (37.3 C)] 98.9 F (37.2 C) (11/26 0356) Pulse Rate:  [76-99] 94 (11/26 0356) Cardiac Rhythm: Normal sinus rhythm (11/26 0700) Resp:  [14-21] 14 (11/26 0500) BP: (102-116)/(65-80) 102/65 (11/26 0356) SpO2:  [96 %-100 %] 96 % (11/26 0500)  Hemodynamic parameters for last 24 hours:    Intake/Output from previous day: 11/25 0701 - 11/26 0700 In: 1320 [P.O.:1080; I.V.:240] Out: 1485 [Urine:1425; Chest Tube:60] Intake/Output this shift: No intake/output data recorded.  General appearance: alert, cooperative and no distress Neurologic: intact Heart: regular rate and rhythm Lungs: clear to auscultation bilaterally small air leak with marked tidal movement  Lab Results: No results for input(s): WBC, HGB, HCT, PLT in the last 72 hours. BMET: No results for input(s): NA, K, CL, CO2, GLUCOSE, BUN, CREATININE, CALCIUM in the last 72 hours.  PT/INR: No results for input(s): LABPROT, INR in the last 72 hours. ABG    Component Value Date/Time   PHART 7.397 08/17/2017 1425   HCO3 23.0 08/17/2017 1425   TCO2 27 11/30/2009 1214   ACIDBASEDEF 1.2 08/17/2017 1425   O2SAT 96.8 08/17/2017 1425   CBG (last 3)  No results for input(s): GLUCAP in the last 72 hours.  Assessment/Plan: S/P Procedure(s) (LRB): RIGHT VIDEO ASSISTED THORACOSCOPY (VATS)/WEDGE RESECTION (Right) RIGHT UPPER LOBE LOBECTOMY (Right) LYMPH NODE DISSECTION (N/A) -Overall doing well but still has a small air leak CXR shows a small apical pneumo Will change to a mini x press. If CXR stable will dc home tomorrow Path still pending   LOS: 7 days    Heather [name] 08/27/2017

## 2017-08-28 ENCOUNTER — Inpatient Hospital Stay (HOSPITAL_COMMUNITY): Payer: 59

## 2017-08-28 MED ORDER — OXYCODONE HCL 5 MG PO TABS: 5.0000 mg | ORAL_TABLET | Freq: Four times a day (QID) | ORAL | 0 refills | Status: DC | PRN

## 2017-08-28 MED FILL — oxyCODONE HCL 5 MG TABS: 5 | 4 days supply | Qty: 30 | Fill #0

## 2017-08-28 NOTE — Progress Notes (Signed)
Removed Rt. IJ and PIV access. Patient received discharge instruction with prescription for pain. Pt has Rt. Chest tube with arranging home care. Pt understood well discharge instruction. Pt left to room at 1245. HS [employer]

## 2017-08-28 NOTE — Discharge Instructions (Signed)
Thoracotomy, Care After This sheet gives you information about how to care for yourself after your procedure. Your doctor may also give you more specific instructions. If you have problems or questions, contact your doctor. Follow these instructions at home: Preventing lung infection ( pneumonia)  Take deep breaths or do breathing exercises as told by your doctor.  Cough often. Coughing is important to clear thick spit (phlegm) and open your lungs. If coughing hurts, hold a pillow against your chest or place both hands flat on top of your cut (splinting) when you cough. This may help with discomfort.  Use an incentive spirometer as told. This is a tool that measures how well you fill your lungs with each breath.  Do lung therapy (pulmonary rehabilitation) as told. Medicines  Take over-the-counter or prescription medicines only as told by your doctor.  If you have pain, take pain-relieving medicine before your pain gets very bad. This will help you breathe and cough more comfortably.  If you were prescribed an antibiotic medicine, take it as told by your doctor. Do not stop taking the antibiotic even if you start to feel better. Activity  Ask your doctor what activities are safe for you.  Do not travel by airplane for 2 weeks after your chest tube is removed, or until your doctor says that this is safe.  Do not lift anything that is heavier than 10 lb (4.5 kg), or the limit that your doctor tells you, until he or she says that it is safe.  Do not drive until your doctor approves. ? Do not drive or use heavy machinery while taking prescription pain medicine. Incision care  Follow instructions from your doctor about how to take care of your cut from surgery (incision). Make sure you: ? Wash your hands with soap and water before you change your bandage (dressing). If you cannot use soap and water, use hand sanitizer. ? Change your bandage as told by your doctor. ? Leave stitches  (sutures), skin glue, or skin tape (adhesive) strips in place. They may need to stay in place for 2 weeks or longer. If tape strips get loose and curl up, you may trim the loose edges. Do not remove tape strips completely unless your doctor says it is okay.  Keep your bandage dry.  Check your cut from surgery every day for signs of infection. Check for: ? More redness, swelling, or pain. ? More fluid or blood. ? Warmth. ? Pus or a bad smell. Bathing  Do not take baths, swim, or use a hot tub until your doctor approves. You may take showers.  After your bandage has been removed, use soap and water to gently wash your cut from surgery. Do not use anything else to clean your cut unless your doctor tells you to. Eating and drinking  Eat a healthy diet as told by your doctor. A healthy diet includes: ? Fresh fruits and vegetables. ? Whole grains. ? Low-fat (lean) proteins.  Drink enough fluid to keep your pee (urine) clear or pale yellow. General instructions  To prevent or treat trouble pooping (constipation) while you are taking prescription pain medicine, your doctor may recommend that you: ? Take over-the-counter or prescription medicines. ? Eat foods that are high in fiber. These include fresh fruits and vegetables, whole grains, and beans. ? Limit foods that are high in fat and processed sugars, such as fried and sweet foods.  Do not use any products that contain nicotine or tobacco. These include cigarettes  and e-cigarettes. If you need help quitting, ask your doctor.  Avoid secondhand smoke.  Wear compression stockings as told. These help to prevent blood clots and reduce swelling in your legs.  If you have a chest tube, care for it as told.  Keep all follow-up visits as told by your doctor. This is important. Contact a doctor if:  You have more redness, swelling, or pain around your cut from surgery.  You have more fluid or blood coming from your cut from  surgery.  Your cut from surgery feels warm to the touch.  You have pus or a bad smell coming from your cut from surgery.  You have a fever or chills.  Your heartbeat seems uneven.  You feel sick to your stomach (nauseous).  You throw up (vomit).  You have muscle aches.  You have trouble pooping (having a bowel movement). This may mean that you: ? Poop fewer times in a week than normal. ? Have a hard time pooping. ? Have poop that is dry, hard, or bigger than normal. Get help right away if:  You get a rash.  You feel light-headed.  You feel like you might pass out (faint).  You are short of breath.  You have trouble breathing.  You are confused.  You have trouble talking.  You have problems with your seeing (vision).  You are not able to move.  You lose feeling (have numbness) in your: ? Face. ? Arms. ? Legs.  You pass out.  You have a sudden, bad headache.  You feel weak.  You have chest pain.  You have pain that: ? Is very bad. ? Gets worse, even with medicine. Summary  Take deep breaths, do breathing exercises, and cough often. This helps prevent lung infection (pneumonia).  Do not drive until your doctor approves. Do not travel by airplane for 2 weeks after your chest tube is removed, or until your doctor says that this is safe.  Check your cut from surgery every day for signs of infection.  Eat a healthy diet. This includes fresh fruits and vegetables, whole grains, and low-fat (lean) proteins. This information is not intended to replace advice given to you by your health care provider. Make sure you discuss any questions you have with your health care provider. Document Released: 03/19/2012 Document Revised: 06/12/2016 Document Reviewed: 06/12/2016 Elsevier Interactive Patient Education  2017 [employer].

## 2017-08-28 NOTE — Progress Notes (Signed)
Discontinued PCA and waste fentanyl 25 ml. HS [employer]

## 2017-08-28 NOTE — Progress Notes (Signed)
Change fentanyl syringe and waste fentanyl 2 ml. HS Glynis Smiles

## 2017-08-28 NOTE — Progress Notes (Signed)
8 Days Post-Op Procedure(s) (LRB): RIGHT VIDEO ASSISTED THORACOSCOPY (VATS)/WEDGE RESECTION (Right) RIGHT UPPER LOBE LOBECTOMY (Right) LYMPH NODE DISSECTION (N/A) Subjective: No complaints this morning  Objective: Vital signs in last 24 hours: Temp:  [98.1 F (36.7 C)-99.1 F (37.3 C)] 98.4 F (36.9 C) (11/27 0811) Pulse Rate:  [66-93] 93 (11/27 0811) Cardiac Rhythm: Normal sinus rhythm (11/27 0700) Resp:  [12-22] 16 (11/27 0811) BP: (94-110)/(52-84) 109/74 (11/27 0811) SpO2:  [96 %-100 %] 98 % (11/27 0811) Weight:  [97 lb (44 kg)] 97 lb (44 kg) (11/27 0621)  Hemodynamic parameters for last 24 hours:    Intake/Output from previous day: 11/26 0701 - 11/27 0700 In: 462.6 [P.O.:360; I.V.:102.6] Out: 500 [Urine:500] Intake/Output this shift: No intake/output data recorded.  General appearance: alert, cooperative and no distress Neurologic: intact Heart: regular rate and rhythm Lungs: clear to auscultation bilaterally difficult to assess air leak with minimal fluid but likely still present  Lab Results: No results for input(s): WBC, HGB, HCT, PLT in the last 72 hours. BMET: No results for input(s): NA, K, CL, CO2, GLUCOSE, BUN, CREATININE, CALCIUM in the last 72 hours.  PT/INR: No results for input(s): LABPROT, INR in the last 72 hours. ABG    Component Value Date/Time   PHART 7.397 08/17/2017 1425   HCO3 23.0 08/17/2017 1425   TCO2 27 11/30/2009 1214   ACIDBASEDEF 1.2 08/17/2017 1425   O2SAT 96.8 08/17/2017 1425   CBG (last 3)  No results for input(s): GLUCAP in the last 72 hours.  Assessment/Plan: S/P Procedure(s) (LRB): RIGHT VIDEO ASSISTED THORACOSCOPY (VATS)/WEDGE RESECTION (Right) RIGHT UPPER LOBE LOBECTOMY (Right) LYMPH NODE DISSECTION (N/A) Plan for discharge: see discharge orders   CXR unchanged with CT to mini X press DC PCA DC central line Home later today with CT in place Will see back in the office Friday afternoon with a CXR   LOS: 8 days     [name] 08/28/2017

## 2017-08-28 NOTE — Progress Notes (Signed)
Spoke with Jadene Pierini, PA about patient's chest x-ray which showed an increase pneumothorax. Patient still given the okay to discharge today and Dr. Roxan Hockey aware.

## 2017-08-28 NOTE — Care Management Note (Addendum)
Case Management Note  Patient Details  Name: Heather [name] MRN: [id number] Date of Birth: March 27, 1971  Subjective/Objective:      Pt is s/p VATS              Action/Plan:  PTA independent from home. Pt will discharge home with mini-express.  CM offered HHRN choice - pt chose [facility] - agency contact however CM was unable to reach liaison.  Pt's second choice was [facility] - CM contacted agency and pts referral was accepted - agency informed that pt has mini-express and will discharge home today.    Pt has PCP and denied barriers to obtaining and paying for prescription medications     Expected Discharge Date:  08/28/17               Expected Discharge Plan:  Home/Self Care  In-House Referral:     Discharge planning Services  CM Consult  Post Acute Care Choice:    Choice offered to:     DME Arranged:    DME Agency:     HH Arranged:    [place] Agency:     Status of Service:  Completed, signed off  If discussed at [employer] of Stay Meetings, dates discussed:    Additional Comments: Update;  CM informed after pts discharge that [facility] would not be able to accept referral for [facility] with mini-express.  CM was able to get in touch with [facility] and referral was accepted for [facility] with mini express.  CM attempted to contact pt however had to leave voicemail Maryclare Labrador, RN 08/28/2017, 8:58 AM

## 2017-08-29 ENCOUNTER — Other Ambulatory Visit: Payer: Self-pay | Admitting: *Deleted

## 2017-08-29 ENCOUNTER — Encounter: Payer: Self-pay | Admitting: *Deleted

## 2017-08-29 NOTE — Patient Outreach (Signed)
[facility]) Care Management  08/29/2017  Heather [name] 1970-12-08 [id number]   Subjective: Telephone call to patient's home / mobile number, spoke with patient, and HIPAA verified.  Discussed [facility] Care Management UMR Transition of care follow up, patient voiced understanding, and is in agreement to follow up.    Patient states she is doing ok, has supportive family / friends, is a single parent, has a 29 year old son, able to self manage activities of daily living / home management, does not have designated caregiver, will assess family / friends as needed, and has a follow up appointment with surgeon on 08/31/17 to remove chest tube.   States she also has a 2nd follow up appointment with surgeon on 09/11/17.  States her emergency contact Printmaker, sister) lives in [place],  is caregiver for her mother and is not able to be caregiver for patient at this time.  Patient states she will also be receiving home health services through [facility] instead of [facility] not able to provide services due to patient being out of service area, per voicemail she received from hospital [facility]).  Patient verbally given the contact information [phone]) for home health agency and states she will call if she has not received a call from agency by end of business 08/29/17.  Patient states she has been told in the past that she has prediabetes, COPD, is currently not having any issues, and not receiving any treatment. Patient voices understanding of medical diagnosis, surgery, and treatment plan.  States she is accessing the following [facility] benefits: outpatient pharmacy, hospital indemnity (verbally given contact number for [facility] [phone], will contact to file claim if appropriate, verbally given contact number for [facility] [employer] [phone]), Employee Assistance Counseling Program [phone] she will follow up as needed), [place] [phone]) regarding Advanced Directives document completion (states she will follow up as needed),  and has family medical leave act (FMLA) in place.   States she did not realize all the resources that [facility] has available for employees and is very appreciative of the information.  Patient states she does not have any education material, transition of care, care coordination, disease management, disease monitoring, transportation, community resource, or pharmacy needs at this time.  States she is very appreciative of the follow up and is in agreement to receive [place] Management information.     Objective: Per KPN (Knowledge Performance Now, point of care tool) and chart review, patient hospitalized 08/20/17 -08/28/17 for Pulmonary nodule.   Status post Right video-assisted thoracoscopy, wedge resection, right upper lobe x2, thoracoscopic right upper lobectomy, mediastinal lymph node dissection, On-Q local anesthetic catheter placement.  Post operative diagnosis: Non-small cell carcinoma, clinical stage IA.  Patient also has a history of Cervical adenopathy, Factor V Leiden , Prediabetes, and COPD.      Assessment:  Received UMR Transition of care referral on 08/28/17.   Transition of care follow up completed, no care management needs, and will proceed with case closure.     Plan: RNCM will send patient successful outreach letter, [facility] pamphlet, and magnet. RNCM will send case closure due to follow up completed / no care management needs request to [facility].    Heather [name] H. Annia Friendly, BSN, [place] Management [facility] Telephonic CM Phone: [phone] Fax: [phone]

## 2017-08-30 ENCOUNTER — Other Ambulatory Visit: Payer: Self-pay | Admitting: Thoracic Surgery (Cardiothoracic Vascular Surgery)

## 2017-08-30 DIAGNOSIS — Z902 Acquired absence of lung [part of]: Secondary | ICD-10-CM

## 2017-08-30 DIAGNOSIS — I700 Atherosclerosis of aorta: Secondary | ICD-10-CM | POA: Diagnosis not present

## 2017-08-30 DIAGNOSIS — C3411 Malignant neoplasm of upper lobe, right bronchus or lung: Secondary | ICD-10-CM | POA: Diagnosis not present

## 2017-08-30 DIAGNOSIS — R7303 Prediabetes: Secondary | ICD-10-CM | POA: Diagnosis not present

## 2017-08-30 DIAGNOSIS — Z483 Aftercare following surgery for neoplasm: Secondary | ICD-10-CM | POA: Diagnosis not present

## 2017-08-30 DIAGNOSIS — F17210 Nicotine dependence, cigarettes, uncomplicated: Secondary | ICD-10-CM | POA: Diagnosis not present

## 2017-08-30 DIAGNOSIS — J449 Chronic obstructive pulmonary disease, unspecified: Secondary | ICD-10-CM | POA: Diagnosis not present

## 2017-08-30 DIAGNOSIS — C539 Malignant neoplasm of cervix uteri, unspecified: Secondary | ICD-10-CM | POA: Diagnosis not present

## 2017-08-30 DIAGNOSIS — D6851 Activated protein C resistance: Secondary | ICD-10-CM | POA: Diagnosis not present

## 2017-08-31 ENCOUNTER — Encounter: Payer: Self-pay | Admitting: Thoracic Surgery (Cardiothoracic Vascular Surgery)

## 2017-08-31 ENCOUNTER — Ambulatory Visit (INDEPENDENT_AMBULATORY_CARE_PROVIDER_SITE_OTHER): Payer: Self-pay | Admitting: Thoracic Surgery (Cardiothoracic Vascular Surgery)

## 2017-08-31 ENCOUNTER — Ambulatory Visit
Admission: RE | Admit: 2017-08-31 | Discharge: 2017-08-31 | Disposition: A | Discharge status: Home / Self Care | Payer: 59 | Source: Ambulatory Visit | Attending: Thoracic Surgery (Cardiothoracic Vascular Surgery) | Admitting: Thoracic Surgery (Cardiothoracic Vascular Surgery)

## 2017-08-31 ENCOUNTER — Other Ambulatory Visit: Payer: Self-pay

## 2017-08-31 VITALS — BP 109/74 | HR 100 | Ht 63.5 in | Wt 97.0 lb

## 2017-08-31 DIAGNOSIS — Z902 Acquired absence of lung [part of]: Secondary | ICD-10-CM

## 2017-08-31 DIAGNOSIS — J939 Pneumothorax, unspecified: Secondary | ICD-10-CM | POA: Diagnosis not present

## 2017-08-31 DIAGNOSIS — J95812 Postprocedural air leak: Secondary | ICD-10-CM

## 2017-08-31 DIAGNOSIS — C3491 Malignant neoplasm of unspecified part of right bronchus or lung: Secondary | ICD-10-CM

## 2017-08-31 NOTE — Progress Notes (Signed)
[address]       [place] [address]             [phone]    HPI: Heather [name] returns today for scheduled follow-up visit  She is a 46 year old woman with a history of tobacco abuse and COPD who had a thoracoscopic right upper lobectomy and mediastinal node dissection on 08/20/2017.  The nodule turned out to be a stage IA adenocarcinoma.  Her postoperative course was complicated by prolonged air leak and she was sent home with a chest tube in place on 08/28/2017.  She complains of some irritation mostly from the tape on the bandage around the chest tube.  She says that the skin around the incision is sensitive but she is not having incisional pain per se.  She has not been taking oxycodone very often.  She denies any shortness of breath or leg swelling.  Past Medical History:  Diagnosis Date  . Blood dyscrasia   . COPD (chronic obstructive pulmonary disease) ([place])   . Current smoker   . Difficult intubation    pt was told [facility]  . 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 Rapid resting heart rate    happens infrequently     Current Outpatient Medications  Medication Sig Dispense Refill  . oxyCODONE (OXY IR/ROXICODONE) 5 MG immediate release tablet Take 1-2 tablets (5-10 mg total) by mouth every 6 (six) hours as needed for severe pain. 30 tablet 0   No current facility-administered medications for this visit.     Physical Exam BP 109/74 (BP Location: Left Arm, Patient Position: Sitting, Cuff Size: Normal)   Pulse 100   Ht 5' 3.5" (1.613 m)   Wt 97 lb (44 kg)   SpO2 97%   BMI 16.23 kg/m  47 year old woman in no acute distress Alert and oriented x3 with no focal deficits Lungs clear with equal breath sounds bilaterally Incisions well-healed.  Some mild erythema at the chest tube site. To-and-fro motion and tubing with questionable small air leak  Diagnostic Tests: CHEST  2 VIEW  COMPARISON:   08/28/2017.  FINDINGS: A right chest tube remains in place with a slight decrease in size of the previously demonstrated right apical pneumothorax, currently occupying less than 10% of the volume of the right hemithorax. Decreased right upper soft tissue air. Stable surgical staples overlying a rounded density in the right superior mediastinum. Stable surgical clips overlying the mid mediastinum. Clear left lung, remaining mildly hyperexpanded. Unremarkable bones.  IMPRESSION: 1. Slight decrease in size of the right apical pneumothorax, occupying less than 10% of the volume of the right hemithorax. 2. Decreased right subcutaneous emphysema. 3. Stable right superior mediastinal rounded density with overlying surgical staples. 4. Stable mild changes of COPD.   Electronically Signed   By: Claudie Revering M.D.   On: 08/31/2017 15:03 I personally reviewed the chest x-ray images and concur with the findings noted above  Impression: Heather [name] is a 46 year old smoker who had a thoracoscopic right upper lobectomy for stage IA adenocarcinoma about 12 days ago.  She had a prolonged air leak and went home with the tube in place 3 days ago.  It is very difficult to tell if she still has a definite air leak or not.  She does have a space and there is a lot of tidal variation within the tubing.  I think to be on the safe side we should  leave the tube in over the weekend.  I will plan to see her back on Monday and reassess whether or not it is safe to remove the tube.  Overall she is doing very well in terms of recovery especially considering the issues with the air leak.  Plan: Return on Monday with PA and lateral chest x-ray  [name], MD [facility] [phone]

## 2017-09-03 ENCOUNTER — Other Ambulatory Visit: Payer: Self-pay

## 2017-09-03 ENCOUNTER — Other Ambulatory Visit: Payer: Self-pay | Admitting: *Deleted

## 2017-09-03 ENCOUNTER — Ambulatory Visit
Admission: RE | Admit: 2017-09-03 | Discharge: 2017-09-03 | Disposition: A | Discharge status: Home / Self Care | Payer: 59 | Source: Ambulatory Visit | Attending: Thoracic Surgery (Cardiothoracic Vascular Surgery) | Admitting: Thoracic Surgery (Cardiothoracic Vascular Surgery)

## 2017-09-03 ENCOUNTER — Other Ambulatory Visit: Payer: Self-pay | Admitting: Physician Assistant

## 2017-09-03 ENCOUNTER — Inpatient Hospital Stay (HOSPITAL_COMMUNITY): Payer: 59

## 2017-09-03 ENCOUNTER — Ambulatory Visit (INDEPENDENT_AMBULATORY_CARE_PROVIDER_SITE_OTHER): Payer: Self-pay | Admitting: Thoracic Surgery (Cardiothoracic Vascular Surgery)

## 2017-09-03 ENCOUNTER — Other Ambulatory Visit: Payer: Self-pay | Admitting: Thoracic Surgery (Cardiothoracic Vascular Surgery)

## 2017-09-03 ENCOUNTER — Inpatient Hospital Stay (HOSPITAL_COMMUNITY)
Admission: AD | Admit: 2017-09-03 | Discharge: 2017-09-06 | DRG: 187 | Disposition: A | Discharge status: Home / Self Care | Payer: 59 | Source: Ambulatory Visit | Attending: Thoracic Surgery (Cardiothoracic Vascular Surgery) | Admitting: Thoracic Surgery (Cardiothoracic Vascular Surgery)

## 2017-09-03 VITALS — BP 104/78 | HR 118 | Resp 16 | Ht 63.5 in | Wt 97.0 lb

## 2017-09-03 DIAGNOSIS — Z902 Acquired absence of lung [part of]: Secondary | ICD-10-CM

## 2017-09-03 DIAGNOSIS — J948 Other specified pleural conditions: Secondary | ICD-10-CM | POA: Diagnosis not present

## 2017-09-03 DIAGNOSIS — R079 Chest pain, unspecified: Secondary | ICD-10-CM | POA: Diagnosis present

## 2017-09-03 DIAGNOSIS — R7303 Prediabetes: Secondary | ICD-10-CM | POA: Diagnosis present

## 2017-09-03 DIAGNOSIS — J939 Pneumothorax, unspecified: Secondary | ICD-10-CM

## 2017-09-03 DIAGNOSIS — Z9689 Presence of other specified functional implants: Secondary | ICD-10-CM

## 2017-09-03 DIAGNOSIS — Z4682 Encounter for fitting and adjustment of non-vascular catheter: Secondary | ICD-10-CM

## 2017-09-03 DIAGNOSIS — J95812 Postprocedural air leak: Secondary | ICD-10-CM

## 2017-09-03 DIAGNOSIS — D682 Hereditary deficiency of other clotting factors: Secondary | ICD-10-CM | POA: Diagnosis present

## 2017-09-03 DIAGNOSIS — Z87891 Personal history of nicotine dependence: Secondary | ICD-10-CM | POA: Diagnosis not present

## 2017-09-03 DIAGNOSIS — C3491 Malignant neoplasm of unspecified part of right bronchus or lung: Secondary | ICD-10-CM

## 2017-09-03 DIAGNOSIS — J449 Chronic obstructive pulmonary disease, unspecified: Secondary | ICD-10-CM | POA: Diagnosis present

## 2017-09-03 DIAGNOSIS — J95811 Postprocedural pneumothorax: Secondary | ICD-10-CM

## 2017-09-03 DIAGNOSIS — Z09 Encounter for follow-up examination after completed treatment for conditions other than malignant neoplasm: Secondary | ICD-10-CM

## 2017-09-03 HISTORY — DX: Cardiac murmur, unspecified: R01.1

## 2017-09-03 HISTORY — DX: Headache: R51

## 2017-09-03 HISTORY — DX: Prediabetes: R73.03

## 2017-09-03 HISTORY — DX: Headache, unspecified: R51.9

## 2017-09-03 HISTORY — DX: Malignant neoplasm of unspecified part of unspecified bronchus or lung: C34.90

## 2017-09-03 LAB — CBC
HEMATOCRIT: 34.5 % — AB (ref 36.0–46.0)
HEMOGLOBIN: 11.5 g/dL — AB (ref 12.0–15.0)
MCH: 30.7 pg (ref 26.0–34.0)
MCHC: 33.3 g/dL (ref 30.0–36.0)
MCV: 92.2 fL (ref 78.0–100.0)
Platelets: 235 10*3/uL (ref 150–400)
RBC: 3.74 MIL/uL — ABNORMAL LOW (ref 3.87–5.11)
RDW: 13.2 % (ref 11.5–15.5)
WBC: 14.8 10*3/uL — ABNORMAL HIGH (ref 4.0–10.5)

## 2017-09-03 LAB — HEMOGLOBIN A1C
Hgb A1c MFr Bld: 5.5 % (ref 4.8–5.6)
Mean Plasma Glucose: 111.15 mg/dL

## 2017-09-03 LAB — COMPREHENSIVE METABOLIC PANEL
ALBUMIN: 3.4 g/dL — AB (ref 3.5–5.0)
ALK PHOS: 46 U/L (ref 38–126)
ALT: 13 U/L — ABNORMAL LOW (ref 14–54)
ANION GAP: 9 (ref 5–15)
AST: 14 U/L — AB (ref 15–41)
BILIRUBIN TOTAL: 0.5 mg/dL (ref 0.3–1.2)
BUN: 8 mg/dL (ref 6–20)
CHLORIDE: 101 mmol/L (ref 101–111)
CO2: 25 mmol/L (ref 22–32)
Calcium: 9.1 mg/dL (ref 8.9–10.3)
Creatinine, Ser: 0.66 mg/dL (ref 0.44–1.00)
GFR calc Af Amer: >60 mL/min (ref >60)
GFR calc non Af Amer: >60 mL/min (ref >60)
GLUCOSE: 104 mg/dL — AB (ref 65–99)
Potassium: 3.3 mmol/L — ABNORMAL LOW (ref 3.5–5.1)
Sodium: 135 mmol/L (ref 135–145)
Total Protein: 6.5 g/dL (ref 6.5–8.1)

## 2017-09-03 MED ORDER — LIDOCAINE HCL (PF) 1 % IJ SOLN
INTRAMUSCULAR | Status: AC
  Administered 2017-09-03: 5 mL
  Filled 2017-09-03: qty 5

## 2017-09-03 MED ORDER — SODIUM CHLORIDE 0.9% FLUSH
3.0000 mL | Freq: Two times a day (BID) | INTRAVENOUS | Status: DC
  Administered 2017-09-03 – 2017-09-05 (×5): 3 mL via INTRAVENOUS

## 2017-09-03 MED ORDER — ACETAMINOPHEN 325 MG PO TABS
650.0000 mg | ORAL_TABLET | Freq: Four times a day (QID) | ORAL | Status: DC | PRN
  Administered 2017-09-03 – 2017-09-05 (×3): 650 mg via ORAL
  Filled 2017-09-03 (×3): qty 2

## 2017-09-03 MED ORDER — ONDANSETRON HCL 4 MG/2ML IJ SOLN: 4.0000 mg | Freq: Four times a day (QID) | INTRAMUSCULAR | Status: DC | PRN

## 2017-09-03 MED ORDER — POLYETHYLENE GLYCOL 3350 17 G PO PACK: 17.0000 g | PACK | Freq: Every day | ORAL | Status: DC | PRN

## 2017-09-03 MED ORDER — OXYCODONE HCL 5 MG PO TABS
5.0000 mg | ORAL_TABLET | ORAL | Status: DC | PRN
  Administered 2017-09-03: 5 mg via ORAL
  Administered 2017-09-03 – 2017-09-06 (×12): 10 mg via ORAL
  Filled 2017-09-03 (×13): qty 2

## 2017-09-03 MED ORDER — ONDANSETRON HCL 4 MG PO TABS: 4.0000 mg | ORAL_TABLET | Freq: Four times a day (QID) | ORAL | Status: DC | PRN

## 2017-09-03 MED ORDER — DOCUSATE SODIUM 100 MG PO CAPS
100.0000 mg | ORAL_CAPSULE | Freq: Two times a day (BID) | ORAL | Status: DC | PRN
  Administered 2017-09-05: 100 mg via ORAL
  Filled 2017-09-03: qty 1

## 2017-09-03 MED ORDER — LIDOCAINE HCL (PF) 1 % IJ SOLN
INTRAMUSCULAR | Status: AC
  Administered 2017-09-03: 18:00:00
  Filled 2017-09-03: qty 5

## 2017-09-03 MED ORDER — ZOLPIDEM TARTRATE 5 MG PO TABS: 5.0000 mg | ORAL_TABLET | Freq: Every evening | ORAL | Status: DC | PRN

## 2017-09-03 NOTE — Progress Notes (Signed)
Reviewed CXR  The tube is either intraparenchymal or in the fissure. There is still a significant pneumothorax. Will need to reposition the tube.  D/w patient  Revonda Standard. Roxan Hockey, MD [facility] [phone]

## 2017-09-03 NOTE — Procedures (Signed)
Initial CT did not resolve pneumothorax.   Discussed with [name]. She agreed to have new tube placed.  A new pigtail catheter placed into right pleural space using Seldinger technique one interspace above previous tube. Sterile technique used. 9 ml of 1% lidocaine local. + air return with needle placement and wire passed easily, + air leak after tube placed to suction. Some discomfort when initially placed to suction. Easing off now.  Called for CXR.

## 2017-09-03 NOTE — Procedures (Signed)
On arrival to 4E Mrs. Manuelito noted tightness in her chest. Still not short of breath. Given size of pneumothorax and onset of symptoms, I recommended we proceed with chest tube placement. She was informed of the indications, risks and benefits and agrees to proceed.  Pigtail catheter placed into right pleural space using Seldinger technique. Sterile technique used. 10 ml of 1% lidocaine local. + air return with needle placement and wire passed easily, but no air leak after tube placed. Will obtain CXR.  Tolerated well  Revonda Standard. Roxan Hockey, MD [facility] [phone]

## 2017-09-03 NOTE — Progress Notes (Addendum)
[address]       [place] [address]             [phone]    HPI: Heather [name] returns for scheduled follow-up  She is a 46 year old woman with a history of tobacco abuse and COPD who had a thoracoscopic right upper lobectomy and node dissection on 08/20/2017.  It turned out to be a stage IA adenocarcinoma.  She had a prolonged air leak and went home with a chest tube on 11/27.  I saw in the office on 08/31/2017 and there was a questionable small air leak.  Over the weekend she did not have any new issues.  She does have some pain at the tube site and still has some pain at the incision as well.  She has not had any problems with shortness of breath.  Past Medical History:  Diagnosis Date  . Blood dyscrasia   . COPD (chronic obstructive pulmonary disease) (Heather [name])   . Current smoker   . Difficult intubation    [facility]  . 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 Rapid resting heart rate    happens infrequently    Current Outpatient Medications  Medication Sig Dispense Refill  . oxyCODONE (OXY IR/ROXICODONE) 5 MG immediate release tablet Take 1-2 tablets (5-10 mg total) by mouth every 6 (six) hours as needed for severe pain. 30 tablet 0   No current facility-administered medications for this visit.     Physical Exam BP 104/78 (BP Location: Left Arm, Patient Position: Sitting, Cuff Size: Normal)   Pulse (!) 118   Resp 16   Ht 5' 3.5" (1.613 m)   Wt 97 lb (44 kg)   SpO2 98% Comment: ON RA  BMI 16.31 kg/m  46 year old woman in no acute distress Alert and oriented x3 with no focal deficits Cardiac regular rate and rhythm normal S1-S2 Lungs clear with equal breath sounds bilaterally Amber fluid in mini express, some tidal variation, no air leak  Diagnostic Tests: CHEST  2 VIEW  COMPARISON:  08/31/2017  FINDINGS: Right chest tube remains in place. Pulmonary resection as seen previously. Pleural  air at the right apex is diminished but does persist. No worsening or new finding. Mediastinal bulging on the right appears unchanged.  IMPRESSION: Right chest tube remains in place. Right apical pleural air is decreased but does persist.   Electronically Signed   By: Nelson Chimes M.D.   On: 09/03/2017 15:31 I personally reviewed the chest x-ray images and concur with the findings noted above.  Impression: [name] is a 46 year old woman who had a thoracoscopic right upper lobectomy for a stage IA adenocarcinoma about 2 weeks ago.  She had a small air leak but it would not resolve completely and she was discharged home with a chest tube.  On Friday there was a question of a small air leak, but today I do not see any suggestion of a leak at all.  I recommended to her that we remove the chest tube.  Right chest tube removed without difficulty.  Plan: I will send her down for a PA and lateral chest x-ray today and then I will see her back again in a week with a repeat chest x-ray.  Heather Nakayama, MD [facility] [phone]  Follow up CXR shows a large right pneumothorax  She is asymptomatic as far as pain and  shortness of breath, but does notice air moving at CT site. She is so thin she may be sucking air in and expelling it with respiration. Even if there was a small residual air leak I would not expect a pneumo of that size to develop that quickly. Nevertheless, she lives in [place] and I am not comfortable with her being that far away. I will admit her for 23 hour observation and repeat a CXR in the morning.   Heather Standard Roxan Hockey, MD [facility] [phone]

## 2017-09-04 ENCOUNTER — Other Ambulatory Visit: Payer: Self-pay

## 2017-09-04 ENCOUNTER — Inpatient Hospital Stay (HOSPITAL_COMMUNITY): Payer: 59

## 2017-09-04 ENCOUNTER — Encounter (HOSPITAL_COMMUNITY): Payer: Self-pay

## 2017-09-04 LAB — GLUCOSE, CAPILLARY: Glucose-Capillary: 92 mg/dL (ref 65–99)

## 2017-09-04 MED ORDER — ENOXAPARIN SODIUM 30 MG/0.3ML ~~LOC~~ SOLN
30.0000 mg | SUBCUTANEOUS | Status: DC
  Administered 2017-09-05: 30 mg via SUBCUTANEOUS
  Filled 2017-09-04: qty 0.3

## 2017-09-04 MED ORDER — ENOXAPARIN SODIUM 40 MG/0.4ML ~~LOC~~ SOLN
40.0000 mg | SUBCUTANEOUS | Status: DC
  Administered 2017-09-04: 40 mg via SUBCUTANEOUS
  Filled 2017-09-04: qty 0.4

## 2017-09-04 NOTE — Progress Notes (Signed)
Patient ambulated 466ft in the hall , ambulation well tolerated, will continue to monitor.

## 2017-09-04 NOTE — H&P (Signed)
[address]       [place] [address]             [phone]               HPI: Heather [name] returns for scheduled follow-up  She is a 46 year old woman with a history of tobacco abuse and COPD who had a thoracoscopic right upper lobectomy and node dissection on 08/20/2017.  It turned out to be a stage IA adenocarcinoma.  She had a prolonged air leak and went home with a chest tube on 11/27.  I saw in the office on 08/31/2017 and there was a questionable small air leak.  Over the weekend she did not have any new issues.  She does have some pain at the tube site and still has some pain at the incision as well.  She has not had any problems with shortness of breath.      Past Medical History:  Diagnosis Date  . Blood dyscrasia   . COPD (chronic obstructive pulmonary disease) ([place])   . Current smoker   . Difficult intubation    [facility]  . 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 Rapid resting heart rate    happens infrequently          Current Outpatient Medications  Medication Sig Dispense Refill  . oxyCODONE (OXY IR/ROXICODONE) 5 MG immediate release tablet Take 1-2 tablets (5-10 mg total) by mouth every 6 (six) hours as needed for severe pain. 30 tablet 0   No current facility-administered medications for this visit.     Physical Exam BP 104/78 (BP Location: Left Arm, Patient Position: Sitting, Cuff Size: Normal)   Pulse (!) 118   Resp 16   Ht 5' 3.5" (1.613 m)   Wt 97 lb (44 kg)   SpO2 98% Comment: ON RA  BMI 16.33 kg/m  46 year old woman in no acute distress Alert and oriented x3 with no focal deficits Cardiac regular rate and rhythm normal S1-S2 Lungs clear with equal breath sounds bilaterally Amber fluid in mini express, some tidal variation, no air leak  Diagnostic Tests: CHEST 2 VIEW  COMPARISON: 08/31/2017  FINDINGS: Right chest tube remains in place. Pulmonary  resection as seen previously. Pleural air at the right apex is diminished but does persist. No worsening or new finding. Mediastinal bulging on the right appears unchanged.  IMPRESSION: Right chest tube remains in place. Right apical pleural air is decreased but does persist.   Electronically Signed By: Nelson Chimes M.D. On: 09/03/2017 15:31 I personally reviewed the chest x-ray images and concur with the findings noted above.  Impression: Heather [name] is a 46 year old woman who had a thoracoscopic right upper lobectomy for a stage IA adenocarcinoma about 2 weeks ago.  She had a small air leak but it would not resolve completely and she was discharged home with a chest tube.  On Friday there was a question of a small air leak, but today I do not see any suggestion of a leak at all.  I recommended to her that we remove the chest tube.  Right chest tube removed without difficulty.  Plan: I will send her down for a PA and lateral chest x-ray today and then I will see her back again in a week with a repeat chest x-ray.  [name], MD [facility] [phone]  Follow up CXR shows  a large right pneumothorax  She is asymptomatic as far as pain and shortness of breath, but does notice air moving at CT site. She is so thin she may be sucking air in and expelling it with respiration. Even if there was a small residual air leak I would not expect a pneumo of that size to develop that quickly. Nevertheless, she lives in [place] and I am not comfortable with her being that far away. I will admit her for 23 hour observation and repeat a CXR in the morning.   Revonda Standard Roxan Hockey, MD [facility] [phone]

## 2017-09-04 NOTE — Progress Notes (Signed)
Patient ambulated 451ft in the hall, ambulation well tolerated, will continue to monitor.

## 2017-09-04 NOTE — Care Management Note (Signed)
Case Management Note Marvetta Gibbons RN, BSN Unit 4E-Case Manager [phone]  Patient Details  Name: Heather [name] MRN: [id number] Date of Birth: 17-Dec-1970  Subjective/Objective:  Pt admitted with hydropneumothorax- hx of recent VATS- d/c'd home with chest tube to mini express- returned for f/u visit with return of pntx                 Action/Plan: PTA pt lived at home- was discharged last admission with [facility] -[facility] with [facility]- for CT management- CM will follow for any d/c needs for transition to home  Expected Discharge Date:  09/06/17               Expected Discharge Plan:  Home/Self Care  In-House Referral:     Discharge planning Services  CM Consult  Post Acute Care Choice:    Choice offered to:     DME Arranged:    DME Agency:     HH Arranged:    [place] Agency:     Status of Service:  In process, will continue to follow  If discussed at Long Length of Stay Meetings, dates discussed:    Discharge Disposition:   Additional Comments:  Dawayne Patricia, RN 09/04/2017, 10:07 AM

## 2017-09-04 NOTE — Progress Notes (Signed)
Patient ambulated 490 ft in the hall ambulation well tolerated

## 2017-09-05 ENCOUNTER — Inpatient Hospital Stay (HOSPITAL_COMMUNITY): Payer: 59

## 2017-09-05 LAB — GLUCOSE, CAPILLARY: GLUCOSE-CAPILLARY: 120 mg/dL — AB (ref 65–99)

## 2017-09-05 MED ORDER — DIPHENHYDRAMINE HCL 25 MG PO CAPS
25.0000 mg | ORAL_CAPSULE | ORAL | Status: DC | PRN
  Administered 2017-09-05: 25 mg via ORAL
  Filled 2017-09-05: qty 1

## 2017-09-05 NOTE — Discharge Instructions (Signed)

## 2017-09-05 NOTE — Progress Notes (Addendum)
[address]       [place] [address]             [phone]         Subjective: Feels ok, not SOB  Objective: Vital signs in last 24 hours: Temp:  [98 F (36.7 C)-100.2 F (37.9 C)] 99.2 F (37.3 C) (12/05 0735) Pulse Rate:  [50-110] 110 (12/05 0735) Cardiac Rhythm: Normal sinus rhythm (12/05 0742) Resp:  [14-20] 14 (12/05 0735) BP: (99-122)/(50-70) 106/50 (12/05 0735) SpO2:  [99 %-100 %] 99 % (12/05 0735) Weight:  [96 lb 4.8 oz (43.7 kg)] 96 lb 4.8 oz (43.7 kg) (12/05 0358)  Hemodynamic parameters for last 24 hours:    Intake/Output from previous day: 12/04 0701 - 12/05 0700 In: 730 [P.O.:730] Out: 600 [Urine:600] Intake/Output this shift: No intake/output data recorded.  General appearance: alert, cooperative and no distress Heart: regular rate and rhythm and tachy Lungs: clear to auscultation bilaterally Wound: healing well  Lab Results: Recent Labs    09/03/17 1838  WBC 14.8*  HGB 11.5*  HCT 34.5*  PLT 235   BMET:  Recent Labs    09/03/17 1838  NA 135  K 3.3*  CL 101  CO2 25  GLUCOSE 104*  BUN 8  CREATININE 0.66  CALCIUM 9.1    PT/INR: No results for input(s): LABPROT, INR in the last 72 hours. ABG    Component Value Date/Time   PHART 7.397 08/17/2017 1425   HCO3 23.0 08/17/2017 1425   TCO2 27 11/30/2009 1214   ACIDBASEDEF 1.2 08/17/2017 1425   O2SAT 96.8 08/17/2017 1425   CBG (last 3)  Recent Labs    09/04/17 0641 09/05/17 0744  GLUCAP 92 120*    Meds Scheduled Meds: . enoxaparin (LOVENOX) injection  30 mg Subcutaneous Q24H  . sodium chloride flush  3 mL Intravenous Q12H   Continuous Infusions: PRN Meds:.acetaminophen, diphenhydrAMINE, docusate sodium, ondansetron **OR** ondansetron (ZOFRAN) IV, oxyCODONE, polyethylene glycol, zolpidem  Xrays Dg Chest 2 View  Result Date: 09/05/2017 CLINICAL DATA:  Follow-up right pneumothorax EXAM: CHEST  2 VIEW COMPARISON:  09/04/2017 FINDINGS: Cardiac shadow is  stable. The left lung remains well aerated. Postsurgical changes are noted on the right with fullness in the right suprahilar region consistent with the recent surgery. Right chest tube is again noted. Stable appearing right hydropneumothorax is noted predominately along the apical region. The lateral component has decreased somewhat in the interval. No new focal abnormality is seen. IMPRESSION: Overall stable right hydropneumothorax although there has been some decrease in the lateral component. No new focal abnormality is seen. Electronically Signed   By: Inez Catalina M.D.   On: 09/05/2017 07:45   X-ray Chest Pa And Lateral  Result Date: 09/04/2017 CLINICAL DATA:  Hydropneumothorax revaluation. EXAM: CHEST  2 VIEW COMPARISON:  09/03/2017 FINDINGS: Right-sided chest tube in satisfactory position. Increased small-moderate right pneumothorax. Trace right pleural effusion. Stable right paramediastinal opacity likely reflecting postsurgical changes. No left pneumothorax. No focal consolidation. Stable heart size. No acute osseous abnormality. IMPRESSION: 1. Interval increase in size of a small-moderate right hydropneumothorax measure approximately 10%. Electronically Signed   By: Kathreen Devoid   On: 09/04/2017 09:00   Dg Chest 2 View  Result Date: 09/03/2017 CLINICAL DATA:  Followup postsurgical pneumothorax. EXAM: CHEST  2 VIEW COMPARISON:  08/31/2017 FINDINGS: Right chest tube remains in place. Pulmonary resection as seen previously. Pleural air at the right apex is diminished but does persist. No worsening  or new finding. Mediastinal bulging on the right appears unchanged. IMPRESSION: Right chest tube remains in place. Right apical pleural air is decreased but does persist. Electronically Signed   By: Nelson Chimes M.D.   On: 09/03/2017 15:31   Dg Chest 2v Repeat Same Day  Result Date: 09/03/2017 CLINICAL DATA:  Status post right chest tube removal. EXAM: CHEST  2 VIEW COMPARISON:  Pre removal chest x-ray  of today at 1535 hours. FINDINGS: The right pneumothorax has increased in size and amounts to approximately 35-40% of the lung volume. There is pleural fluid at the right lung base. There is no mediastinal shift. There is stable soft tissue fullness in the right paratracheal region just above the carina. The left lung is clear. The heart and pulmonary vascularity are normal. IMPRESSION: There is an approximately 35-40% right-sided hydropneumothorax which has increased in volume since the pre chest tube removal film. Critical Value/emergent results were called by telephone at the time of interpretation on 09/03/2017 at 4:30 pm to Elizabeth Palau, RN, TCTS [facility] , who verbally acknowledged these results. Electronically Signed   By: David  Martinique M.D.   On: 09/03/2017 16:34   Dg Chest Port 1 View  Result Date: 09/03/2017 CLINICAL DATA:  46 year old female status post right upper lobectomy on 08/20/2017. Pneumothorax with right chest tube in place. EXAM: PORTABLE CHEST 1 VIEW COMPARISON:  1919 hours today and earlier. FINDINGS: Portable AP semi upright view at 2057 hours. Significantly decreased right pneumothorax. Only small right apical residual. The right chest tube position has mildly changed but this may be related to re-expansion of the right lung. Stable abnormal right mediastinal contour. Stable ventilation elsewhere. Stable mild right chest wall subcutaneous gas. IMPRESSION: 1. Significantly regressed right pneumothorax. Small right apical residual. Right chest tube in place. 2. No new cardiopulmonary abnormality. Electronically Signed   By: Genevie Ann M.D.   On: 09/03/2017 21:26   Dg Chest Port 1 View  Result Date: 09/03/2017 CLINICAL DATA:  Encounter for chest tube placement EXAM: PORTABLE CHEST 1 VIEW COMPARISON:  Portable exam 1919 hours compared to 1635 hours FINDINGS: Interval placement of RIGHT thoracostomy tube. Persistent very large RIGHT pneumothorax without mediastinal shift.  Postsurgical changes at RIGHT upper lobe. Underlying emphysematous changes with mild LEFT apical scarring. No infiltrate or pleural effusion. Stable heart size. Prominence of RIGHT hilum unchanged. Atherosclerotic calcification aorta. IMPRESSION: Persistent large RIGHT pneumothorax despite thoracostomy tube placement. No evidence of mediastinal shift to suggest tension. Electronically Signed   By: Lavonia Dana M.D.   On: 09/03/2017 19:52    Chest tube : no air leak  Assessment/Plan: 1 stable pntx on H2O seal- continue the same for now 2 routine pulm toilet/rehab      LOS: 2 days    Heather [name] 09/05/2017 Patient seen and examined, agree with above I think the pneumo is slightly better today. Has had no air leak and has been on water seal for 24 hours Will clamp tube and repeat CXR this afternoon- if CXR OK will dc tube  Remo Lipps C. Roxan Hockey, MD [facility] [phone]

## 2017-09-05 NOTE — Progress Notes (Signed)
Patient ambulated 932ft in the hall, ambulation well tolerated

## 2017-09-05 NOTE — Discharge Summary (Signed)
Physician Discharge Summary  Patient ID: Heather [name] MRN: [id number] DOB/AGE: 08-Sep-1971 46 y.o.  Admit date: 09/03/2017 Discharge date: 09/06/2017  Admission Diagnoses: pneumothorax  Discharge Diagnoses:  Active Problems:   Hydropneumothorax  Patient Active Problem List   Diagnosis Date Noted  . Hydropneumothorax 09/03/2017  . S/P lobectomy of lung 08/20/2017  . Pulmonary nodule 07/16/2017  . Cervical adenopathy 07/04/2017  . Abdominal pain 01/02/2017  . Factor V Leiden ([place]) 04/13/2015  . Prediabetes 04/13/2015  . History of colon polyps 04/13/2015  . H/O drug rash due to contrast  05/14/2014  . Loss of weight 04/02/2014  . Current smoker 04/02/2014  . Routine health maintenance 04/02/2014  . History of anemia 05/08/2007    HPI:  She is a 46 year old woman with a history of tobacco abuse and COPD who had a thoracoscopic right upper lobectomy and node dissection on 08/20/2017. It turned out to be a stage IAadenocarcinoma. She had a prolonged air leak and went home with a chest tube on 11/27.I saw in the office on 08/31/2017 and there was a questionable small air leak.  Over the weekend she did not have any new issues. She does have some pain at the tube site and still has some pain at the incision as well. She has not had any problems with shortness of breath. The chest tube was removed in the office without difficulty and a PA and lateral chest x-ray was obtained.  This x-ray showed a large right pneumothorax.  She was asymptomatic as far as pain and shortness of breath however there was some suction type noise at the chest tube site noted and it was felt that she should be admitted for further observation.  Discharged Condition: good  Hospital Course: The patient was admitted to the hospital for observation.  On arrival to 4 E. she noted increased tightness in her chest.  Given the size of the pneumothorax and the onset of new symptoms Dr. Roxan Hockey recommended  placing a chest tube and this was done.  She tolerated it well initially.  On postprocedure x-ray it was felt that the tube needed to be repositioned and this was done.  She is being monitored with serial chest x-rays and the pneumothorax has decreased in size over time.  She has no air leak noted.  The chest tube was clamped for approximately 4 hours and a repeat chest x-ray was obtained and showed no significant change.  The chest tube was therefore removed.  A repeat chest x-ray the following morning was obtained and the pneumothorax remained small and unchanged.  He was felt to be quite stable for discharge on today's date.  Consults: None  Significant Diagnostic Studies: serial CXR's  Treatments: pigtail chest tube placement right chest  Discharge Exam:   General appearance: alert, cooperative and no distress Heart: regular rate and rhythm Lungs: clear to auscultation bilaterally Abdomen: benign Extremities: no edema or calf tenderness Wound: incis healing well   Disposition: 06-Home-Health Care Svc  Discharge Instructions    Discharge patient   Complete by:  As directed    Discharge disposition:  01-Home or Self Care   Discharge patient date:  09/06/2017     Allergies as of 09/06/2017      Reactions   Iohexol Swelling   Aspirin Nausea And Vomiting      Medication List    TAKE these medications   oxyCODONE 5 MG immediate release tablet Commonly known as:  Oxy IR/ROXICODONE Take 1 tablet (5 mg  total) by mouth every 6 (six) hours as needed for severe pain. What changed:  how much to take      Follow-up Information    [name], MD Follow up.   Specialty:  Cardiothoracic Surgery Why:  Appointment to see Dr. Roxan Hockey on 09/20/2017 at 4:45 PM.  Please obtain a chest x-ray at [facility] is located in the same office complex on the first floor. Contact information: [address] [place]  [address] [phone]           Signed: John Giovanni PA-C 09/06/2017, 7:39 AM

## 2017-09-05 NOTE — Progress Notes (Signed)
      [address]       [place] [address]             [phone]      No issues with CT clamped CXR stable No air leak when unclamped Will dc chest tube Possibly home in [place] if no new issues  Remo Lipps C. Roxan Hockey, MD [facility] [phone]

## 2017-09-05 NOTE — Progress Notes (Signed)
Chest tube dc by this RN and Katty RN as ordered, procedure well tolerated, will continue to monitor.

## 2017-09-06 ENCOUNTER — Inpatient Hospital Stay (HOSPITAL_COMMUNITY): Payer: 59

## 2017-09-06 LAB — GLUCOSE, CAPILLARY: Glucose-Capillary: 126 mg/dL — ABNORMAL HIGH (ref 65–99)

## 2017-09-06 MED ORDER — OXYCODONE HCL 5 MG PO TABS: 5.0000 mg | ORAL_TABLET | Freq: Four times a day (QID) | ORAL | 0 refills | Status: DC | PRN

## 2017-09-06 MED FILL — oxyCODONE HCL 5 MG TABS: 5 | 8 days supply | Qty: 30 | Fill #0

## 2017-09-06 NOTE — Care Management Note (Signed)
Case Management Note Marvetta Gibbons RN, BSN Unit 4E-Case Manager [phone]  Patient Details  Name: Heather [name] MRN: [id number] Date of Birth: 07-27-1971  Subjective/Objective:  Pt admitted with hydropneumothorax- hx of recent VATS- [facility]- returned for f/u visit with return of pntx                 Action/Plan: PTA pt lived at home- was discharged last admission with [facility] -per last CM note services set up with [facility]- for CT management- CM will follow for any d/c needs for transition to home  Expected Discharge Date:  09/06/17               Expected Discharge Plan:  Home/Self Care  In-House Referral:  NA  Discharge planning Services  CM Consult  Post Acute Care Choice:  NA Choice offered to:  NA  DME Arranged:    DME Agency:     HH Arranged:  NA HH Agency:     Status of Service:  Completed, signed off  If discussed at [place] of Stay Meetings, dates discussed:    Discharge Disposition: home/self care   Additional Comments:  09/06/17- 0955- Marvetta Gibbons RN, CM- pt for d/c home today- no CM needs for d/c- chest tube has been removed- (call received from [facility] with [facility]- for services however pt will not need HH this time)-   Dawayne Patricia, RN 09/06/2017, 9:57 AM

## 2017-09-06 NOTE — Progress Notes (Signed)
Pt/family given discharge instructions, medication lists, follow up appointments, and when to call the doctor.  Pt/family verbalizes understanding. Pt given signs and symptoms of infection. Calvary Difranco McClintock, RN    

## 2017-09-06 NOTE — Progress Notes (Addendum)
[address]       [place] [address]             [phone]         Subjective: Feels fine, not SOB, CXR is stable  Objective: Vital signs in last 24 hours: Temp:  [98.3 F (36.8 C)-99.2 F (37.3 C)] 98.7 F (37.1 C) (12/06 0338) Pulse Rate:  [94-119] 94 (12/06 0338) Cardiac Rhythm: Sinus tachycardia (12/05 1900) Resp:  [14-30] 18 (12/06 0338) BP: (93-106)/(44-60) 104/60 (12/06 0338) SpO2:  [95 %-100 %] 99 % (12/06 0338) Weight:  [96 lb 12.8 oz (43.9 kg)] 96 lb 12.8 oz (43.9 kg) (12/06 0338)  Hemodynamic parameters for last 24 hours:    Intake/Output from previous day: 12/05 0701 - 12/06 0700 In: 720 [P.O.:720] Out: 700 [Urine:700] Intake/Output this shift: Total I/O In: -  Out: 1000 [Urine:1000]  General appearance: alert, cooperative and no distress Heart: regular rate and rhythm Lungs: clear to auscultation bilaterally Abdomen: benign Extremities: no edema or calf tenderness Wound: incis healing well  Lab Results: Recent Labs    09/03/17 1838  WBC 14.8*  HGB 11.5*  HCT 34.5*  PLT 235   BMET:  Recent Labs    09/03/17 1838  NA 135  K 3.3*  CL 101  CO2 25  GLUCOSE 104*  BUN 8  CREATININE 0.66  CALCIUM 9.1    PT/INR: No results for input(s): LABPROT, INR in the last 72 hours. ABG    Component Value Date/Time   PHART 7.397 08/17/2017 1425   HCO3 23.0 08/17/2017 1425   TCO2 27 11/30/2009 1214   ACIDBASEDEF 1.2 08/17/2017 1425   O2SAT 96.8 08/17/2017 1425   CBG (last 3)  Recent Labs    09/04/17 0641 09/05/17 0744  GLUCAP 92 120*    Meds Scheduled Meds: . enoxaparin (LOVENOX) injection  30 mg Subcutaneous Q24H  . sodium chloride flush  3 mL Intravenous Q12H   Continuous Infusions: PRN Meds:.acetaminophen, diphenhydrAMINE, docusate sodium, ondansetron **OR** ondansetron (ZOFRAN) IV, oxyCODONE, polyethylene glycol, zolpidem  Xrays Dg Chest 2 View  Result Date: 09/05/2017 CLINICAL DATA:  Follow-up right  pneumothorax EXAM: CHEST  2 VIEW COMPARISON:  09/04/2017 FINDINGS: Cardiac shadow is stable. The left lung remains well aerated. Postsurgical changes are noted on the right with fullness in the right suprahilar region consistent with the recent surgery. Right chest tube is again noted. Stable appearing right hydropneumothorax is noted predominately along the apical region. The lateral component has decreased somewhat in the interval. No new focal abnormality is seen. IMPRESSION: Overall stable right hydropneumothorax although there has been some decrease in the lateral component. No new focal abnormality is seen. Electronically Signed   By: Inez Catalina M.D.   On: 09/05/2017 07:45   X-ray Chest Pa And Lateral  Result Date: 09/04/2017 CLINICAL DATA:  Hydropneumothorax revaluation. EXAM: CHEST  2 VIEW COMPARISON:  09/03/2017 FINDINGS: Right-sided chest tube in satisfactory position. Increased small-moderate right pneumothorax. Trace right pleural effusion. Stable right paramediastinal opacity likely reflecting postsurgical changes. No left pneumothorax. No focal consolidation. Stable heart size. No acute osseous abnormality. IMPRESSION: 1. Interval increase in size of a small-moderate right hydropneumothorax measure approximately 10%. Electronically Signed   By: Kathreen Devoid   On: 09/04/2017 09:00   Dg Chest 1v Repeat Same Day  Result Date: 09/05/2017 CLINICAL DATA:  Follow-up right sided pneumothorax after chest tube removal. EXAM: CHEST - 1 VIEW SAME DAY COMPARISON:  Chest x-ray of September 05, 2017 at 12:58 p.m. FINDINGS: There is a persistent approximately 15 % right apical pneumothorax after chest tube removal. The interstitial markings in the right mid lung are coarse which is along the tract of the previous small caliber chest tube. There is a small right pleural effusion. The left lung is well-expanded and clear. The heart and pulmonary vascularity are normal. There is a soft tissue mass in the right  paratracheal region with some suture material observed within it. IMPRESSION: Stable approximately 15% right apical pneumothorax. Electronically Signed   By: David  Martinique M.D.   On: 09/05/2017 16:25   Dg Chest Port 1 View  Result Date: 09/05/2017 CLINICAL DATA:  Clamped chest tube EXAM: PORTABLE CHEST 1 VIEW COMPARISON:  Earlier today FINDINGS: Right-sided small bore chest tube in stable position. Unchanged small right apical pneumothorax, ~ 10%. Small right pleural effusion. Postoperative, distorted mediastinum. Normal heart size. Clear left lung with stable apical thickening. IMPRESSION: Unchanged small right apical pneumothorax. Electronically Signed   By: Monte Fantasia M.D.   On: 09/05/2017 13:20    Assessment/Plan: S/P  Plan for discharge: see discharge orders   LOS: 3 days    John Giovanni 09/06/2017 Patient seen and examined, agree with above Home today  [place]. Roxan Hockey, MD [facility] [phone]

## 2017-09-07 ENCOUNTER — Other Ambulatory Visit: Payer: Self-pay | Admitting: Thoracic Surgery (Cardiothoracic Vascular Surgery)

## 2017-09-07 DIAGNOSIS — Z902 Acquired absence of lung [part of]: Secondary | ICD-10-CM

## 2017-09-11 ENCOUNTER — Other Ambulatory Visit: Payer: Self-pay

## 2017-09-11 ENCOUNTER — Ambulatory Visit
Admission: RE | Admit: 2017-09-11 | Discharge: 2017-09-11 | Disposition: A | Discharge status: Home / Self Care | Payer: 59 | Source: Ambulatory Visit | Attending: Thoracic Surgery (Cardiothoracic Vascular Surgery) | Admitting: Thoracic Surgery (Cardiothoracic Vascular Surgery)

## 2017-09-11 ENCOUNTER — Encounter: Payer: Self-pay | Admitting: Thoracic Surgery (Cardiothoracic Vascular Surgery)

## 2017-09-11 ENCOUNTER — Ambulatory Visit (INDEPENDENT_AMBULATORY_CARE_PROVIDER_SITE_OTHER): Payer: Self-pay | Admitting: Thoracic Surgery (Cardiothoracic Vascular Surgery)

## 2017-09-11 VITALS — BP 111/79 | HR 93 | Resp 18 | Ht 63.5 in | Wt 97.0 lb

## 2017-09-11 DIAGNOSIS — C3491 Malignant neoplasm of unspecified part of right bronchus or lung: Secondary | ICD-10-CM

## 2017-09-11 DIAGNOSIS — Z902 Acquired absence of lung [part of]: Secondary | ICD-10-CM

## 2017-09-11 DIAGNOSIS — J449 Chronic obstructive pulmonary disease, unspecified: Secondary | ICD-10-CM | POA: Diagnosis not present

## 2017-09-11 NOTE — Progress Notes (Signed)
[address]       [place] [address]             [phone]     HPI: Heather [name] returns for a scheduled follow up visit  She is a 46 yo woman with a history of tobacco abuse who had a VATS right upper lobectomy on 08/20/2017 for a stage IA adenocarcinoma. She had a prolonged air leak and went home with a chest tube on 11/27.I saw in the office on 08/31/2017 and there was a questionable small air leak. I saw her back on 12/4 and there was no air leak. I removed the tube in the office. There was a pneumothorax on the post removal CXR and she was admitted for observation. She complained of discomfort in the chest so a pigtail catheter was placed. She did not have an air leak and the tube was removed the following day. She was discharged on 12/6.  She has not had any problems with her breathing since discharge.  She does have pain in the right shoulder blade area.  She is using oxycodone at times for that.  She only had to take that once yesterday. Past Medical History:  Diagnosis Date  . Blood dyscrasia   . COPD (chronic obstructive pulmonary disease) ([place])   . Current smoker   . Difficult intubation    pt was told difficult intubation at [facility]  . 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 Headache    "weekly" (09/04/2017)  . Heart murmur   . Lung cancer ([place]) dx'd 08/2017  . Pre-diabetes   . Rapid resting heart rate    happens infrequently    Current Outpatient Medications  Medication Sig Dispense Refill  . oxyCODONE (OXY IR/ROXICODONE) 5 MG immediate release tablet Take 1 tablet (5 mg total) by mouth every 6 (six) hours as needed for severe pain. 30 tablet 0   No current facility-administered medications for this visit.     Physical Exam BP 111/79 (BP Location: Right Arm, Patient Position: Sitting, Cuff Size: Normal)   Pulse 93   Resp 18   Ht 5' 3.5" (1.613 m)   Wt 97 lb (44 kg)   SpO2 98% Comment: on RA  BMI 16.7 kg/m    46 year old woman in no acute distress Alert and oriented x3 with no focal deficits Lungs slightly diminished at right base, otherwise clear Incision and chest tube sites healing well Cardiac regular rate and rhythm  Diagnostic Tests: CHEST  2 VIEW  COMPARISON:  PA and lateral chest x-ray of September 06, 2017  FINDINGS: There is a persistent right-sided hydropneumothorax with the upper lung surface overlying in the interspace of the fourth and fifth posterior ribs. A moderate amount of pleural fluid persists at the right lung base. There is stable density in the right mid lung. The left lung is mildly hyperinflated. There is stable left apical pleural thickening. There is no left-sided pneumothorax or pleural effusion. The heart is normal in size. There is stable soft tissue fullness in the right paratracheal region. Surgical suture material is present here. There is calcification in the wall of the aortic arch. The bony thorax exhibits no acute abnormality.  IMPRESSION: COPD. Stable right-sided hydropneumothorax. Stable parenchymal density in the right mid lung and in the right paratracheal region.  Thoracic aortic atherosclerosis.   Electronically Signed   By: David  Martinique M.D.   On: 09/11/2017 10:49 I personally reviewed  the CXR images and concur with the findings noted above  Impression: Heather [name] is a 46 year old woman with a history of tobacco abuse who had a thoracoscopic right upper lobectomy for a stage Ia adenocarcinoma about 3 weeks ago.  That was comp gated by prolonged air leak.  She then had her tube removed in the office and had a pneumothorax afterwards.  I suspect that this was just air that sucked in through the chest tube site as she is extremely thin and had no air leak once a pigtail drain was placed.  In any event her chest x-ray today looks good.  She does have some right scapular pain.  This should improve with time.  I encouraged her to continue  to use the right shoulder and gradually increase her mobility with that.  Tobacco abuse-she quit smoking prior to surgery and has not had any cravings.  Plan: Referral to multidisciplinary thoracic oncology clinic  Return in 1 month with PA and lateral chest x-ray to check on progress.  [name], MD [facility] [phone]

## 2017-09-12 ENCOUNTER — Encounter: Payer: Self-pay | Admitting: *Deleted

## 2017-09-12 ENCOUNTER — Telehealth: Payer: Self-pay | Admitting: *Deleted

## 2017-09-12 DIAGNOSIS — R911 Solitary pulmonary nodule: Secondary | ICD-10-CM

## 2017-09-12 NOTE — Telephone Encounter (Signed)
Oncology Nurse Navigator Documentation  Oncology Nurse Navigator Flowsheets 09/12/2017  Navigator Location [facility]  Navigator Encounter Type Telephone/I received a message from Dr. Leonarda Salon office. He would like her to be seen at clinic. I called and spoke with patient. I updated her on appt for [place] on 09/20/17. She verbalized understanding of appt.   Telephone Outgoing Call  Treatment Phase Follow-up  Barriers/Navigation Needs Coordination of Care  Interventions Coordination of Care  Coordination of Care Appts  Acuity Level 1  Time Spent with Patient 15

## 2017-09-12 NOTE — Patient Instructions (Signed)
Lung Resection A lung resection is a procedure to remove part or all of a lung. When an entire lung is removed, the procedure is called a pneumonectomy. When only part of a lung is removed, the procedure is called a lobectomy. A lung resection is typically done to get rid of a tumor or cancer, but it may be done to treat other conditions. This procedure can help relieve some or all of your symptoms and can also help keep the problem from getting worse. Lung resection may provide the best chance for curing your disease. However, the procedure may not necessarily cure lung cancer if that is the problem. Tell a health care provider about:  Any allergies you have.  All medicines you are taking, including vitamins, herbs, eye drops, creams, and over-the-counter medicines.  Any problems you or family members have had with anesthetic medicines.  Any blood disorders you have.  Any surgeries you have had.  Any medical conditions you have. What are the risks? Generally, lung resection is a safe procedure. However, problems can occur and include:  Excessive bleeding.  Infection.  Inability to breathe without a ventilator.  Persistent shortness of breath.  Heart problems, including abnormal rhythms and a risk of heart attack or heart failure.  Blood clots.  Injury to a blood vessel.  Injury to a nerve.  Failure to heal properly.  Stroke.  Bronchopleural fistula. This is a small hole between one of the main breathing tubes (bronchus) and the lining of the lungs. This is rare.  Reaction to anesthesia.  What happens before the procedure? You may have tests done before the procedure, including:  Blood tests.  Urine tests.  X-rays.  Other imaging tests (such as CT scans, MRI scans, and PET scans). These tests are done to find the exact size and location of the condition being treated with this surgery.  Pulmonary function tests. These are breathing tests to assess the function of  your lungs before surgery and to decide how to best help your breathing after surgery.  Heart testing. This is done to make sure your heart is strong enough for the procedure.  Bronchoscopy. This is a technique that allows your health care provider to look at the inside of your airways. This is done using a soft, flexible tube (bronchoscope). Along with imaging tests, this can help your health care provider know the exact location and size of the area that will be removed during surgery.  Lymph node sampling. This may need to be done to see if the tumor has spread. It may be done as a separate surgery or right before your lung resection procedure.  What happens during the procedure?  An IV tube will be placed in your arm. You will be given a medicine that makes you fall asleep (general anesthetic). You may also get pain medicine through a thin, flexible tube (catheter) in your back.  A breathing tube will be placed in your throat.  Once the surgical team has prepared you for surgery, your surgeon will make an incision on your side. Some resections are done through large incisions, while others can be done through small incisions using smaller instruments and assisted with small cameras (laparoscopic surgery).  Your surgeon will carefully cut the veins, arteries, and bronchus leading to your lung. After being cut, each of these pieces will be sewn or stapled closed. The lung or part of the lung will then be removed.  Your surgeon will check inside your chest to  make sure there is no bleeding in or around the lungs. Lymph nodes near the lung may also be removed for later tests.  Your surgeon may put tubes into your chest to drain extra fluid and air after surgery.  Your incision will be closed. This may be done using: ? Stitches that absorb into your body and do not need to be removed. ? Stitches that must be removed. ? Staples that must be removed. What happens after the procedure?  You  will be taken to the recovery area and your progress will be monitored. You may still have a breathing tube and other tubes or catheters in your body immediately after surgery. These will be removed during your recovery. You may be put on a respirator following surgery if some assistance is needed to help your breathing. When you are awake and not experiencing immediate problems from surgery, you will be moved to the intensive care unit (ICU) where you will continue your recovery.  You may feel pain in your chest and throat. Sometimes during recovery, patients may shiver or feel nauseous. You will be given medicine to help with pain and nausea.  The breathing tube will be taken out as soon as your health care providers feel you can breathe on your own. For most people, this happens on the same day as the surgery.  If your surgery and time in the ICU go well, most of the tubes and equipment will be taken out within 1-2 days after surgery. This is about how long most people stay in the ICU. You may need to stay longer, depending on how you are doing.  You should also start respiratory therapy in the ICU. This therapy uses breathing exercises to help your other lung stay healthy and get stronger.  As you improve, you will be moved to a regular hospital room for continued respiratory therapy, help with your bladder and bowels, and to continue medicines.  After your lung or part of your lung is taken out, there will be a space inside your chest. This space will often fill up with fluid over time. The amount of time this takes is different for each person.  You will receive care until you are doing well and your health care provider feels it is safe for you to go home or to transfer to an extended care facility. This information is not intended to replace advice given to you by your health care provider. Make sure you discuss any questions you have with your health care provider. Document Released:  12/09/2002 Document Revised: 02/24/2016 Document Reviewed: 11/07/2013 Elsevier Interactive Patient Education  2018 [employer].

## 2017-09-19 ENCOUNTER — Telehealth: Payer: Self-pay | Admitting: Internal Medicine

## 2017-09-19 NOTE — Telephone Encounter (Signed)
Called dana she said she would reschedule

## 2017-09-20 ENCOUNTER — Ambulatory Visit: Payer: 59 | Admitting: Thoracic Surgery (Cardiothoracic Vascular Surgery)

## 2017-09-20 ENCOUNTER — Other Ambulatory Visit: Payer: 59

## 2017-09-20 ENCOUNTER — Ambulatory Visit: Payer: 59 | Admitting: Internal Medicine

## 2017-09-28 ENCOUNTER — Telehealth: Payer: Self-pay | Admitting: *Deleted

## 2017-09-28 NOTE — Telephone Encounter (Signed)
Oncology Nurse Navigator Documentation  Oncology Nurse Navigator Flowsheets 09/28/2017  Navigator Location [facility]  Navigator Encounter Type Telephone/Ms. Mihalko missed appt last week with Dr. Julien Nordmann. I called today to follow up and re-schedule.  I spoke with patient and re-scheduled her to see Dr. Julien Nordmann on 10/15/17. She verbalized understanding of appt time and place.   Telephone Outgoing Call  Treatment Phase Follow-up  Barriers/Navigation Needs Coordination of Care  Interventions Coordination of Care  Coordination of Care Appts  Acuity Level 1  Time Spent with Patient 15

## 2017-10-15 ENCOUNTER — Other Ambulatory Visit: Payer: Self-pay | Admitting: Thoracic Surgery (Cardiothoracic Vascular Surgery)

## 2017-10-15 ENCOUNTER — Inpatient Hospital Stay: Payer: 59 | Attending: Internal Medicine | Admitting: Internal Medicine

## 2017-10-15 ENCOUNTER — Telehealth: Payer: Self-pay | Admitting: Internal Medicine

## 2017-10-15 ENCOUNTER — Inpatient Hospital Stay: Payer: 59

## 2017-10-15 ENCOUNTER — Encounter: Payer: Self-pay | Admitting: Internal Medicine

## 2017-10-15 VITALS — BP 116/66 | HR 89 | Temp 98.2°F | Resp 18 | Ht 63.5 in | Wt 100.4 lb

## 2017-10-15 DIAGNOSIS — R079 Chest pain, unspecified: Secondary | ICD-10-CM | POA: Insufficient documentation

## 2017-10-15 DIAGNOSIS — R911 Solitary pulmonary nodule: Secondary | ICD-10-CM

## 2017-10-15 DIAGNOSIS — Z720 Tobacco use: Secondary | ICD-10-CM | POA: Diagnosis not present

## 2017-10-15 DIAGNOSIS — E876 Hypokalemia: Secondary | ICD-10-CM | POA: Diagnosis not present

## 2017-10-15 DIAGNOSIS — C3411 Malignant neoplasm of upper lobe, right bronchus or lung: Secondary | ICD-10-CM

## 2017-10-15 DIAGNOSIS — C3490 Malignant neoplasm of unspecified part of unspecified bronchus or lung: Secondary | ICD-10-CM

## 2017-10-15 DIAGNOSIS — Z902 Acquired absence of lung [part of]: Secondary | ICD-10-CM

## 2017-10-15 DIAGNOSIS — R0609 Other forms of dyspnea: Secondary | ICD-10-CM | POA: Insufficient documentation

## 2017-10-15 HISTORY — DX: Hypokalemia: E87.6

## 2017-10-15 LAB — CBC WITH DIFFERENTIAL/PLATELET
BASOS ABS: 0.1 10*3/uL (ref 0.0–0.1)
BASOS PCT: 1 %
Eosinophils Absolute: 0.1 10*3/uL (ref 0.0–0.5)
Eosinophils Relative: 1 %
HEMATOCRIT: 36.9 % (ref 34.8–46.6)
Hemoglobin: 12 g/dL (ref 11.6–15.9)
Lymphocytes Relative: 18 %
Lymphs Abs: 1.4 10*3/uL (ref 0.9–3.3)
MCH: 29.2 pg (ref 25.1–34.0)
MCHC: 32.5 g/dL (ref 31.5–36.0)
MCV: 89.8 fL (ref 79.5–101.0)
MONOS PCT: 5 %
Monocytes Absolute: 0.4 10*3/uL (ref 0.1–0.9)
NEUTROS PCT: 75 %
Neutro Abs: 5.9 10*3/uL (ref 1.5–6.5)
Platelets: 259 10*3/uL (ref 145–400)
RBC: 4.11 MIL/uL (ref 3.70–5.45)
RDW: 15.1 % (ref 11.2–16.1)
WBC: 7.9 10*3/uL (ref 3.9–10.3)

## 2017-10-15 LAB — COMPREHENSIVE METABOLIC PANEL
ALBUMIN: 4.1 g/dL (ref 3.5–5.0)
ALK PHOS: 88 U/L (ref 40–150)
ALT: 11 U/L (ref 0–55)
AST: 15 U/L (ref 5–34)
Anion gap: 13 — ABNORMAL HIGH (ref 3–11)
BILIRUBIN TOTAL: 0.5 mg/dL (ref 0.2–1.2)
BUN: 8 mg/dL (ref 7–26)
CALCIUM: 9.9 mg/dL (ref 8.4–10.4)
CO2: 25 mmol/L (ref 22–29)
Chloride: 101 mmol/L (ref 98–109)
Creatinine, Ser: 0.72 mg/dL (ref 0.60–1.10)
GFR calc Af Amer: >60 mL/min (ref >60)
GFR calc non Af Amer: >60 mL/min (ref >60)
GLUCOSE: 93 mg/dL (ref 70–140)
Potassium: 4.3 mmol/L (ref 3.3–4.7)
Sodium: 139 mmol/L (ref 136–145)
TOTAL PROTEIN: 8.4 g/dL — AB (ref 6.4–8.3)

## 2017-10-15 NOTE — Progress Notes (Signed)
[place] Telephone:([phone]   Fax:([phone]) [phone]  OFFICE PROGRESS NOTE  Aldine Contes, MD [address] [place] [address]  DIAGNOSIS: Stage IA (T1a, N0, M0) moderately differentiated adenocarcinoma diagnosed in November 2018.  PRIOR THERAPY: Status post right upper lobectomy with mediastinal lymph node dissection under the care of Dr. Roxan Hockey on August 20, 2017.  CURRENT THERAPY: Observation.  INTERVAL HISTORY: Heather [name] 46 y.o. female returns to the clinic today for follow-up visit accompanied by her sister.  The patient is feeling fine today with no specific complaints except for soreness on the right side of the chest as well as shortness of breath with exertion.  She denied having any cough or hemoptysis.  She denied having any recent weight loss or night sweats.  The patient underwent right upper lobectomy with mediastinal lymph node dissection under the care of Dr. Roxan Hockey on August 20, 2017 and the final pathology showed moderately differentiated adenocarcinoma measuring 1.0 cm with negative resection margin as well as negative mediastinal lymphadenopathy.  She is here today for evaluation and recommendation regarding adjuvant therapy.  MEDICAL HISTORY: Past Medical History:  Diagnosis Date  . Blood dyscrasia   . COPD (chronic obstructive pulmonary disease) ([place])   . Current smoker   . Difficult intubation    pt was told difficult intubation [facility]  . 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 Headache    "weekly" (09/04/2017)  . Heart murmur   . Lung cancer ([place]) dx'd 08/2017  . Pre-diabetes   . Rapid resting heart rate    happens infrequently    ALLERGIES:  is allergic to iohexol and aspirin.  MEDICATIONS:  Current Outpatient Medications  Medication Sig Dispense Refill  . oxyCODONE (OXY IR/ROXICODONE) 5 MG immediate release tablet Take 1 tablet (5 mg total) by mouth every 6 (six)  hours as needed for severe pain. 30 tablet 0   No current facility-administered medications for this visit.     SURGICAL HISTORY:  Past Surgical History:  Procedure Laterality Date  . CESAREAN SECTION  1992; 2012  . COLONOSCOPY N/A 05/15/2014   Procedure: COLONOSCOPY;  Surgeon: Lafayette Dragon, MD;  Location: [facility];  Service: Endoscopy;  Laterality: N/A;  . ESOPHAGOGASTRODUODENOSCOPY N/A 05/15/2014   Procedure: ESOPHAGOGASTRODUODENOSCOPY (EGD);  Surgeon: Lafayette Dragon, MD;  Location: [facility];  Service: Endoscopy;  Laterality: N/A;  . LAPAROSCOPY  07/07/2011   Procedure: LAPAROSCOPY DIAGNOSTIC;  Surgeon: Elveria Royals;  Location: [facility];  Service: Gynecology;  Laterality: N/A;  . LESION REMOVAL  07/07/2011   Procedure: EXCISION VAGINAL LESION;  Surgeon: Elveria Royals;  Location: [facility];  Service: Gynecology;  Laterality: N/A;  . LOBECTOMY Right 08/20/2017   Procedure: RIGHT UPPER LOBE LOBECTOMY;  Surgeon: [name], MD;  Location: [facility];  Service: Thoracic;  Laterality: Right;  . LYMPH NODE DISSECTION N/A 08/20/2017   Procedure: LYMPH NODE DISSECTION;  Surgeon: [name], MD;  Location: [facility];  Service: Thoracic;  Laterality: N/A;  . VAGINAL HYSTERECTOMY  ~ 2014  . VIDEO ASSISTED THORACOSCOPY (VATS)/WEDGE RESECTION Right 08/20/2017   Procedure: RIGHT VIDEO ASSISTED THORACOSCOPY (VATS)/WEDGE RESECTION;  Surgeon: [name], MD;  Location: [facility];  Service: Thoracic;  Laterality: Right;    REVIEW OF SYSTEMS:  Constitutional: negative Eyes: negative Ears, nose, mouth, throat, and face: negative Respiratory: positive for dyspnea on exertion and pleurisy/chest pain Cardiovascular: negative Gastrointestinal: negative  Genitourinary:negative Integument/breast: negative Hematologic/lymphatic: negative Musculoskeletal:negative Neurological: negative Behavioral/Psych: negative Endocrine: negative Allergic/Immunologic: negative   PHYSICAL  EXAMINATION: General appearance: alert, cooperative and no distress Head: Normocephalic, without obvious abnormality, atraumatic Neck: no adenopathy, no JVD, supple, symmetrical, trachea midline and thyroid not enlarged, symmetric, no tenderness/mass/nodules Lymph nodes: Cervical, supraclavicular, and axillary nodes normal. Resp: clear to auscultation bilaterally Back: symmetric, no curvature. ROM normal. No CVA tenderness. Cardio: regular rate and rhythm, S1, S2 normal, no murmur, click, rub or gallop GI: soft, non-tender; bowel sounds normal; no masses,  no organomegaly Extremities: extremities normal, atraumatic, no cyanosis or edema Neurologic: Alert and oriented X 3, normal strength and tone. Normal symmetric reflexes. Normal coordination and gait  ECOG PERFORMANCE STATUS: 1 - Symptomatic but completely ambulatory  Blood pressure 116/66, pulse 89, temperature 98.2 F (36.8 C), temperature source Oral, resp. rate 18, height 5' 3.5" (1.613 m), weight 100 lb 6.4 oz (45.5 kg), SpO2 100 %.  LABORATORY DATA: Lab Results  Component Value Date   WBC 7.9 10/15/2017   HGB 12.0 10/15/2017   HCT 36.9 10/15/2017   MCV 89.8 10/15/2017   PLT 259 10/15/2017      Chemistry      Component Value Date/Time   NA 135 09/03/2017 1838   NA 141 07/31/2017 1054   K 3.3 (L) 09/03/2017 1838   K 3.6 07/31/2017 1054   CL 101 09/03/2017 1838   CO2 25 09/03/2017 1838   CO2 23 07/31/2017 1054   BUN 8 09/03/2017 1838   BUN 5.9 (L) 07/31/2017 1054   CREATININE 0.66 09/03/2017 1838   CREATININE 0.8 07/31/2017 1054      Component Value Date/Time   CALCIUM 9.1 09/03/2017 1838   CALCIUM 9.9 07/31/2017 1054   ALKPHOS 46 09/03/2017 1838   ALKPHOS 50 07/31/2017 1054   AST 14 (L) 09/03/2017 1838   AST 12 07/31/2017 1054   ALT 13 (L) 09/03/2017 1838   ALT 9 07/31/2017 1054   BILITOT 0.5 09/03/2017 1838   BILITOT 0.69 07/31/2017 1054       RADIOGRAPHIC STUDIES: No results found.  ASSESSMENT AND  PLAN: This is a very pleasant 47 years old white female recently diagnosed with a stage IA non-small cell lung cancer, adenocarcinoma status post right upper lobectomy with lymph node dissection in November 2018 under the care of Dr. Roxan Hockey. I had a lengthy discussion with the patient and her sister today about her current disease of stage, prognosis and treatment options. I explained to the patient that the 5-year survival for stage IA is around 80%.  Also explained to the patient that there is no evidence for survival benefit for adjuvant systemic chemotherapy for patient with a stage IA. I recommended for the patient to continue on observation with repeat CT scan of the chest in 6 months. For the hypokalemia, she was encouraged to increase her potassium intake in her diet. The patient was advised to call immediately if she has any concerning symptoms in the interval. The patient voices understanding of current disease status and treatment options and is in agreement with the current care plan.  All questions were answered. The patient knows to call the clinic with any problems, questions or concerns. We can certainly see the patient much sooner if necessary.  I spent 15 minutes counseling the patient face to face. The total time spent in the appointment was 25 minutes.  Disclaimer: This note was dictated with voice recognition software. Similar sounding words can inadvertently be transcribed and  may not be corrected upon review.

## 2017-10-15 NOTE — Telephone Encounter (Signed)
Gave avs and calendar for july °

## 2017-10-16 ENCOUNTER — Encounter: Payer: Self-pay | Admitting: Thoracic Surgery (Cardiothoracic Vascular Surgery)

## 2017-10-16 ENCOUNTER — Ambulatory Visit
Admission: RE | Admit: 2017-10-16 | Discharge: 2017-10-16 | Disposition: A | Discharge status: Home / Self Care | Payer: 59 | Source: Ambulatory Visit | Attending: Thoracic Surgery (Cardiothoracic Vascular Surgery) | Admitting: Thoracic Surgery (Cardiothoracic Vascular Surgery)

## 2017-10-16 ENCOUNTER — Ambulatory Visit (INDEPENDENT_AMBULATORY_CARE_PROVIDER_SITE_OTHER): Payer: Self-pay | Admitting: Thoracic Surgery (Cardiothoracic Vascular Surgery)

## 2017-10-16 ENCOUNTER — Other Ambulatory Visit: Payer: Self-pay

## 2017-10-16 VITALS — BP 120/82 | HR 94 | Resp 18 | Ht 63.5 in | Wt 98.8 lb

## 2017-10-16 DIAGNOSIS — Z902 Acquired absence of lung [part of]: Secondary | ICD-10-CM

## 2017-10-16 DIAGNOSIS — J948 Other specified pleural conditions: Secondary | ICD-10-CM

## 2017-10-16 DIAGNOSIS — J939 Pneumothorax, unspecified: Secondary | ICD-10-CM | POA: Diagnosis not present

## 2017-10-16 NOTE — Progress Notes (Signed)
[address]       [place] [address]             [phone]     HPI: [name] returns for scheduled follow-up visit  Heather [name] is a 47 year old woman with a history of tobacco abuse who had a thoracoscopic right upper lobectomy on 08/20/2017 for stage Ia adenocarcinoma.  She had a prolonged air leak postoperatively.  Finally got her tube out on December 4 but she had a pneumothorax on her post removal film.  She was admitted for observation.  She had some pain so a new tube was placed.  She did not have any air leak and that was removed and she was discharged on December 6.  She saw Dr. Julien Nordmann yesterday.  No adjuvant therapy is planned.  He will see her back in 6 months.  She feels well.  She still has some good days and bad days.  She has been driving without any issues.  She not currently taking any pain medication.  She is anxious to return to work.  Her appetite has improved.  She has gained 2 pounds. Past Medical History:  Diagnosis Date  . Blood dyscrasia   . COPD (chronic obstructive pulmonary disease) ([place])   . Current smoker   . Difficult intubation    pt was told difficult intubation [facility]  . 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 Headache    "weekly" (09/04/2017)  . Heart murmur   . Lung cancer ([place]) dx'd 08/2017  . Pre-diabetes   . Rapid resting heart rate    happens infrequently    No current outpatient medications on file.   No current facility-administered medications for this visit.     Physical Exam BP 120/82 (BP Location: Right Arm, Patient Position: Sitting, Cuff Size: Normal)   Pulse 94   Resp 18   Ht 5' 3.5" (1.613 m)   Wt 98 lb 12.8 oz (44.8 kg)   SpO2 99% Comment: RA  BMI 17.65 kg/m  47 year old woman in no acute distress Thin Alert and oriented x3 with no focal deficits Lungs essentially equal bilaterally Incisions well-healed  Diagnostic Tests: CHEST  2 VIEW  COMPARISON:  Radiographs  of September 11, 2017.  FINDINGS: The heart size and mediastinal contours are within normal limits. Left lung is clear. Postsurgical changes are noted in the right lung apex. Moderate right hydropneumothorax is noted which is enlarged compared to prior exam. The visualized skeletal structures are unremarkable.  IMPRESSION: Moderate right hydropneumothorax is noted which is enlarged compared to prior exam. These results will be called to the ordering clinician or representative by the Radiologist Assistant, and communication documented in the PACS or zVision Dashboard.   Electronically Signed   By: Marijo Conception, M.D.   On: 10/16/2017 10:37 I personally reviewed the chest x-ray images and concur with the findings noted above.  There is a residual space  Impression: [name] is a 47 year old woman who is now about 2 months out from a thoracoscopic right upper lobectomy for stage Ia non-small cell carcinoma.  She had a prolonged air leak postoperatively.  Currently she is doing well.  She still has a space on her chest x-ray.  That is not unusual given her hyperinflation prior to surgery.  She saw Dr. Julien Nordmann and does not need adjuvant therapy.  She has been driving without any issues.  She is anxious to return  to work.  I think it is okay for her to go back next week but I recommended she only work half days for the first week is to ease herself back into it.  If she does okay with that she can go back full-time on January 28.  Plan: Return in 6 months after she has CT and sees Dr. Thad Ranger, MD [facility] [phone]

## 2017-12-04 ENCOUNTER — Other Ambulatory Visit: Payer: Self-pay

## 2017-12-04 MED ORDER — BENZONATATE 100 MG PO CAPS: 100.0000 mg | ORAL_CAPSULE | Freq: Three times a day (TID) | ORAL | 1 refills | Status: DC | PRN

## 2017-12-04 MED FILL — BENZONATATE 100 MG CAPS: 100 | 10 days supply | Qty: 30 | Fill #0

## 2017-12-14 ENCOUNTER — Ambulatory Visit (INDEPENDENT_AMBULATORY_CARE_PROVIDER_SITE_OTHER): Payer: 59 | Admitting: Internal Medicine

## 2017-12-14 ENCOUNTER — Encounter: Payer: Self-pay | Admitting: Internal Medicine

## 2017-12-14 ENCOUNTER — Other Ambulatory Visit: Payer: Self-pay

## 2017-12-14 ENCOUNTER — Telehealth: Payer: Self-pay

## 2017-12-14 ENCOUNTER — Ambulatory Visit (HOSPITAL_COMMUNITY)
Admission: RE | Admit: 2017-12-14 | Discharge: 2017-12-14 | Disposition: A | Discharge status: Home / Self Care | Payer: 59 | Source: Ambulatory Visit | Attending: Internal Medicine | Admitting: Internal Medicine

## 2017-12-14 VITALS — BP 109/70 | HR 86 | Temp 98.3°F | Ht 63.0 in | Wt 103.7 lb

## 2017-12-14 DIAGNOSIS — Z85110 Personal history of malignant carcinoid tumor of bronchus and lung: Secondary | ICD-10-CM | POA: Diagnosis not present

## 2017-12-14 DIAGNOSIS — R05 Cough: Secondary | ICD-10-CM | POA: Insufficient documentation

## 2017-12-14 DIAGNOSIS — R109 Unspecified abdominal pain: Secondary | ICD-10-CM | POA: Diagnosis not present

## 2017-12-14 DIAGNOSIS — Z902 Acquired absence of lung [part of]: Secondary | ICD-10-CM | POA: Diagnosis not present

## 2017-12-14 DIAGNOSIS — R0602 Shortness of breath: Secondary | ICD-10-CM

## 2017-12-14 DIAGNOSIS — R079 Chest pain, unspecified: Secondary | ICD-10-CM | POA: Diagnosis not present

## 2017-12-14 DIAGNOSIS — J948 Other specified pleural conditions: Secondary | ICD-10-CM | POA: Diagnosis not present

## 2017-12-14 DIAGNOSIS — Z87891 Personal history of nicotine dependence: Secondary | ICD-10-CM | POA: Diagnosis not present

## 2017-12-14 MED ORDER — DICLOFENAC SODIUM 1 % TD GEL: 2.0000 g | Freq: Four times a day (QID) | TRANSDERMAL | 0 refills | Status: DC

## 2017-12-14 MED FILL — predniSONE 10 MG TABS: 10 | 21 days supply | Qty: 63 | Fill #0

## 2017-12-14 MED FILL — DICLOFENAC SODIUM 1% GEL: 1 | 12 days supply | Qty: 100 | Fill #0

## 2017-12-14 NOTE — Progress Notes (Signed)
CC: cough and right flank pain  HPI:  [name] is a 47 y.o. woman with a past medical history listed below here today for follow up of her cough and right flank pain.  For details of today's visit and the status of her chronic medical issues please refer to the assessment and plan.   Past Medical History:  Diagnosis Date  . Blood dyscrasia   . COPD (chronic obstructive pulmonary disease) ([place])   . Current smoker   . Difficult intubation    pt was told difficult int[facility]  . 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 Headache    "weekly" (09/04/2017)  . Heart murmur   . Lung cancer ([place]) dx'd 08/2017  . Pre-diabetes   . Rapid resting heart rate    happens infrequently   Review of Systems:  Review of Systems  Constitutional: Negative for chills and fever.  HENT: Negative for congestion, sinus pain and sore throat.   Respiratory: Positive for cough and shortness of breath. Negative for hemoptysis, sputum production and wheezing.   Gastrointestinal: Negative for nausea and vomiting.  Genitourinary: Negative for dysuria and frequency.     Physical Exam:  Vitals:   12/14/17 1321  BP: 109/70  Pulse: 86  Temp: 98.3 F (36.8 C)  TempSrc: Oral  SpO2: 100%  Weight: 103 lb 11.2 oz (47 kg)  Height: 5\' 3"  (1.6 m)   Physical Exam  Constitutional: She is oriented to person, place, and time and well-developed, well-nourished, and in no distress.  HENT:  Head: Normocephalic and atraumatic.  Cardiovascular: Normal rate, regular rhythm and normal heart sounds.  Pulmonary/Chest: Effort normal. She has no wheezes. She has no rales. She exhibits tenderness.  Her breath sounds are slightly diminished but otherwise clear.  She is tender over her prior surgical incision sites but they look clean.  She is also tender along her ribs on the right.   Neurological: She is alert and oriented to person, place, and time.  Skin: Skin is warm and dry.    Psychiatric: Mood and affect normal.    Assessment & Plan:   See Encounters Tab for problem based charting.  Patient discussed with Dr. Dareen Piano.  S/P lobectomy of lung She has hx of tobacco use and is s/p right upper lobectomy in Nov 2018 for stage 1a adenocarcinoma.  She last had f/u with oncology and CT surgery in January 2019.  She presents today with pain at prior surgery site and cough.  For the past 2 weeks she has had a dry cough not preceded by any type of viral illness or symptoms.  She was prescribed Tessalon pearls without relief.  This cough has further been accompanied by tenderness and pain at her prior surgical site.  She also subjectively reports shortness of breath.  She has a call in to her surgeons office as well for further advice.  She states pain is worsened with coughing, talking, doing daily activity.  Nothing makes it better and thus far she has tried holding the pillow to her chest while coughing and tylenol.  Fortunately, she is able to speak in full sentences today without any difficulty, she is saturating at 100% on ambient air, and was able to hoist herself on to the exam table with out trouble.    Plan: - I am not sure what is causing her cough to worsen as it has the past 2 weeks in light of no other  symptoms.  She reports a chronic off and on cough since surgery but this is more constant - We will obtain a 2-view CXR for further evaluation - Can continue Tessalon pearls as needed - Will give Voltaren gel for likely MSK type pain at her surgical site and rib areas.  - RTC next week

## 2017-12-14 NOTE — Telephone Encounter (Signed)
RX called to [place] for Prednisone taper. Prednisone 40 mg per day x 1 week; Prednisone 30 mg per day x 1 week; Prednisone 20 mg per day x 1 week.  Appointment with Dr Roxan Hockey in 3 weeks with CXR to follow up. Patient is aware

## 2017-12-14 NOTE — Assessment & Plan Note (Signed)
She has hx of tobacco use and is s/p right upper lobectomy in Nov 2018 for stage 1a adenocarcinoma.  She last had f/u with oncology and CT surgery in January 2019.  She presents today with pain at prior surgery site and cough.  For the past 2 weeks she has had a dry cough not preceded by any type of viral illness or symptoms.  She was prescribed Tessalon pearls without relief.  This cough has further been accompanied by tenderness and pain at her prior surgical site.  She also subjectively reports shortness of breath.  She has a call in to her surgeons office as well for further advice.  She states pain is worsened with coughing, talking, doing daily activity.  Nothing makes it better and thus far she has tried holding the pillow to her chest while coughing and tylenol.  Fortunately, she is able to speak in full sentences today without any difficulty, she is saturating at 100% on ambient air, and was able to hoist herself on to the exam table with out trouble.    Plan: - I am not sure what is causing her cough to worsen as it has the past 2 weeks in light of no other symptoms.  She reports a chronic off and on cough since surgery but this is more constant - We will obtain a 2-view CXR for further evaluation - Can continue Tessalon pearls as needed - Will give Voltaren gel for likely MSK type pain at her surgical site and rib areas.  - RTC next week

## 2017-12-14 NOTE — Patient Instructions (Signed)
FOLLOW-UP INSTRUCTIONS When: 1 week, sooner if needed For: cough and rib pain What to bring: medications  Please go upstairs to radiology to get a chest x ray.  We will let you know what it says when the radiologist reads it  I also ordered pain cream to apply to your rib area to hopefully help these symptoms.

## 2017-12-17 NOTE — Progress Notes (Signed)
Internal Medicine Clinic Attending  Case discussed with Dr. Wallace at the time of the visit.  We reviewed the resident's history and exam and pertinent patient test results.  I agree with the assessment, diagnosis, and plan of care documented in the resident's note.  

## 2017-12-21 ENCOUNTER — Ambulatory Visit (INDEPENDENT_AMBULATORY_CARE_PROVIDER_SITE_OTHER): Payer: 59 | Admitting: Internal Medicine

## 2017-12-21 ENCOUNTER — Other Ambulatory Visit: Payer: Self-pay

## 2017-12-21 VITALS — BP 104/67 | HR 78 | Temp 98.5°F | Ht 63.0 in | Wt 104.0 lb

## 2017-12-21 DIAGNOSIS — R0781 Pleurodynia: Secondary | ICD-10-CM | POA: Diagnosis not present

## 2017-12-21 DIAGNOSIS — Z902 Acquired absence of lung [part of]: Secondary | ICD-10-CM

## 2017-12-21 DIAGNOSIS — Z85118 Personal history of other malignant neoplasm of bronchus and lung: Secondary | ICD-10-CM

## 2017-12-21 HISTORY — DX: Pleurodynia: R07.81

## 2017-12-21 NOTE — Progress Notes (Signed)
Medicine attending: Medical history, presenting problems, physical findings, and medications, reviewed with resident physician Dr Justin Helberg on the day of the patient visit and I concur with his evaluation and management plan. 

## 2017-12-21 NOTE — Patient Instructions (Signed)
Rib Pain: - Ibuprofen 800 mg every every 8 hours x 2 weeks. Take with food. - If things aren't improving please come back to see [place].

## 2017-12-21 NOTE — Progress Notes (Signed)
   CC: Right rib pain  HPI:  [name] is a 47 y.o. who presented to the clinic for evaluation of right sided rib pain of 2 weeks duration. For a detailed evaluation and management plan please refer to problem based charting below.   Past Medical History:  Diagnosis Date  . Blood dyscrasia   . COPD (chronic obstructive pulmonary disease) ([place])   . Current smoker   . Difficult intubation    pt was told [facility]  . 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 Headache    "weekly" (09/04/2017)  . Heart murmur   . Lung cancer ([place]) dx'd 08/2017  . Pre-diabetes   . Rapid resting heart rate    happens infrequently   Review of Systems:   Denies SOB Endorses pain with deep inspiration, increased pain with movement  Denies N/V, abdominal pain   Physical Exam: Vitals:   12/21/17 0922  BP: 104/67  Pulse: 78  Temp: 98.5 F (36.9 C)  TempSrc: Oral  SpO2: 100%  Weight: 104 lb (47.2 kg)  Height: 5\' 3"  (1.6 m)   General: Thin female in no acute distress Pulm: Good air movement with no wheezing or crackles  CV: RRR, no murmurs, no rubs, tenderness to palpation of the right lower thoracic cavity  Extremities: No LE edema  Skin: Warm and dry   Assessment & Plan:   See Encounters Tab for problem based charting.  Patient discussed with Dr. Beryle Beams

## 2017-12-21 NOTE — Assessment & Plan Note (Signed)
It is a 47 year old female status post right upper lobe lobectomy in November 2018 for adenocarcinoma of the right lung who presented to the clinic with two weeks of her right sided thoracic pain. She does not remember any trauma to the area. The pain is worse with cough, deep inspiration, and any type of movement. She has had a cough for several weeks leading up to this pain. She was evaluated on 3/15 for this pain and at the time chest x-ray was obtained. While it is not dedicated rib film there were no apparent lytic lesions.  On physical exam there is tenderness to palpation the right thoracic cavity primarily over the lower ribs. There are no associated skin changes or rashes noted. Cardiac exam does not reveal any rub.  At this point the etiology of her right thoracic pain is likely musculoskeletal. The Voltaren gel prescribed at her last visit has not improved to the pain. I do not think we need any further imaging at this point but will need to monitor this patient given her history of malignancy. She was advised to try ibuprofen for two weeks with food and if her pain is not resolved to restart the clinic. At that point if her pain has not resolved it may be appropriate to do further imaging.  Plan: - Ibuprofen 800 mg TID for 2 weeks  - Follow-up if not improved.

## 2018-01-07 ENCOUNTER — Other Ambulatory Visit: Payer: Self-pay | Admitting: Thoracic Surgery (Cardiothoracic Vascular Surgery)

## 2018-01-07 DIAGNOSIS — Z902 Acquired absence of lung [part of]: Secondary | ICD-10-CM

## 2018-01-08 ENCOUNTER — Ambulatory Visit: Payer: 59 | Admitting: Thoracic Surgery (Cardiothoracic Vascular Surgery)

## 2018-02-05 ENCOUNTER — Ambulatory Visit (INDEPENDENT_AMBULATORY_CARE_PROVIDER_SITE_OTHER): Payer: 59 | Admitting: Thoracic Surgery (Cardiothoracic Vascular Surgery)

## 2018-02-05 ENCOUNTER — Other Ambulatory Visit: Payer: Self-pay

## 2018-02-05 ENCOUNTER — Other Ambulatory Visit: Payer: Self-pay | Admitting: Thoracic Surgery (Cardiothoracic Vascular Surgery)

## 2018-02-05 ENCOUNTER — Ambulatory Visit
Admission: RE | Admit: 2018-02-05 | Discharge: 2018-02-05 | Disposition: A | Discharge status: Home / Self Care | Payer: 59 | Source: Ambulatory Visit | Attending: Thoracic Surgery (Cardiothoracic Vascular Surgery) | Admitting: Thoracic Surgery (Cardiothoracic Vascular Surgery)

## 2018-02-05 ENCOUNTER — Encounter: Payer: Self-pay | Admitting: Thoracic Surgery (Cardiothoracic Vascular Surgery)

## 2018-02-05 VITALS — BP 105/76 | HR 85 | Resp 16 | Ht 63.0 in | Wt 101.6 lb

## 2018-02-05 DIAGNOSIS — Z902 Acquired absence of lung [part of]: Secondary | ICD-10-CM | POA: Diagnosis not present

## 2018-02-05 DIAGNOSIS — R0602 Shortness of breath: Secondary | ICD-10-CM | POA: Diagnosis not present

## 2018-02-05 DIAGNOSIS — R0781 Pleurodynia: Secondary | ICD-10-CM

## 2018-02-05 DIAGNOSIS — R911 Solitary pulmonary nodule: Secondary | ICD-10-CM

## 2018-02-05 DIAGNOSIS — C3491 Malignant neoplasm of unspecified part of right bronchus or lung: Secondary | ICD-10-CM

## 2018-02-05 MED ORDER — OXYCODONE HCL 5 MG PO TABS: 5.0000 mg | ORAL_TABLET | Freq: Four times a day (QID) | ORAL | 0 refills | Status: AC | PRN

## 2018-02-05 MED FILL — oxyCODONE HCL 5 MG TABS: 5 | 4 days supply | Qty: 30 | Fill #0

## 2018-02-05 NOTE — Progress Notes (Signed)
[address]       [place] [address]             [phone]       HPI: Heather [name] returns for follow-up due to right sided chest pain.  Heather [name] is a 47 year old woman with a history of tobacco abuse who had a thoracoscopic right upper lobectomy in November 2018 for stage IA non-small cell carcinoma.  She had a prolonged air leak postoperatively.  She saw Dr. Earlie [name].  No adjuvant therapy is planned.  She has had problems with a persistent cough since surgery.  This tends to come in spasms.  She says this has been getting better over the past month or so.  Unfortunately about a month ago she had a severe coughing spell and developed right-sided chest pain.  She saw her primary who felt it was musculoskeletal in origin.  She continues to have pain it varies from day-to-day and throughout the day.  It is a sharp stabbing pain there is no burning and no radiating component.  She is tender to touch in the area.  She is concerned because she did not have this degree of pain initially with the surgery.  Past Medical History:  Diagnosis Date  . Blood dyscrasia   . COPD (chronic obstructive pulmonary disease) ([place])   . Current smoker   . Difficult intubation    [facility]  . 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 Headache    "weekly" (09/04/2017)  . Heart murmur   . Lung cancer ([place]) dx'd 08/2017  . Pre-diabetes   . Rapid resting heart rate    happens infrequently  ] Current Outpatient Medications  Medication Sig Dispense Refill  . acetaminophen (TYLENOL) 500 MG tablet Take 1,000 mg by mouth every 6 (six) hours as needed.    . naproxen sodium (ALEVE) 220 MG tablet Take 220 mg by mouth 2 (two) times daily as needed.    Marland Kitchen oxyCODONE (OXY IR/ROXICODONE) 5 MG immediate release tablet Take 1-2 tablets (5-10 mg total) by mouth every 6 (six) hours as needed for up to 5 days for severe pain. 30 tablet 0   No current  facility-administered medications for this visit.     Physical Exam BP 105/76 (BP Location: Left Arm, Patient Position: Sitting, Cuff Size: Normal)   Pulse 85   Resp 16   Ht 5\' 3"  (1.6 m)   Wt 101 lb 9.6 oz (46.1 kg)   SpO2 98% Comment: ON RA  BMI 18.7 kg/m  47 year old woman in no acute distress Alert and oriented x3 with no focal deficits Lungs clear with essentially equal breath sounds bilaterally Incisions well-healed Tender to palpation around the seventh and eighth rib laterally.  One interspace below the chest tube sites.  Incision and chest tube sites are well-healed.  Diagnostic Tests: CHEST - 2 VIEW  COMPARISON:  Chest x-ray of 12/14/2017  FINDINGS: There has been further decreases in size of the probable loculated right apical hydropneumothorax. The lungs otherwise are clear and hyperaerated with some pleural thickening over the apices. Mediastinal and hilar contours are unremarkable and the heart is within normal limits in size. No bony abnormality is seen.  IMPRESSION: 1. Further decrease in size of the probable loculated small right apical hydropneumothorax. 2. Hyperaeration.  No pneumonia or effusion.   Electronically Signed   By: Ivar Drape M.D.   On: 02/05/2018  09:56 I personally reviewed the chest x-ray images and concur with the findings noted above  Impression: Heather [name] is a 47 year old former smoker who had a thoracoscopic right upper lobectomy for stage IA adenocarcinoma in November 2018.  She was trouble for a long time by a persistent cough.  That has started to improve.  Unfortunately about a month ago she has severe spell and developed onset of a stabbing pain in her right chest.  She has tried nonsteroidal anti-inflammatories without significant improvement.  She has not tried narcotics for the pain.  The pain site is actually below the level of the chest tube insertions I do not think is related to that.  It does not sound neuropathic  in nature.  I suspect that she has an intercostal muscle tear or possibly even a hairline rib fracture in that area.  I recommend that she take Aleve twice daily for the next 2 weeks.  I am also getting give her a prescription for oxycodone 5 to 10 mg 1 to 2 tablets up to 4 times a day as needed for pain to try and break the pain cycle for her.  I do not think narcotics are a good long-term solution, but they may help relieve her discomfort until regular anti-inflammatories are adequate.  I do not think gabapentin or Lyrica are necessary at this point.  I do not see any concerning findings on her chest x-ray.  However these were not dedicated rib films.  She is due for a CT of the chest soon so I am going to go ahead and do the CT now.  That may give [place] a little more detail on her rib pain.  Plan: Oxycodone 5 to 10 mg 4 times daily as needed for pain Aleve twice daily CT chest Return in 2 weeks  [name], MD [facility] [phone]

## 2018-02-05 NOTE — Progress Notes (Unsigned)
ct 

## 2018-02-06 ENCOUNTER — Other Ambulatory Visit: Payer: Self-pay | Admitting: Internal Medicine

## 2018-02-08 ENCOUNTER — Ambulatory Visit (HOSPITAL_COMMUNITY)
Admission: RE | Admit: 2018-02-08 | Discharge: 2018-02-08 | Disposition: A | Discharge status: Home / Self Care | Payer: 59 | Source: Ambulatory Visit | Attending: Thoracic Surgery (Cardiothoracic Vascular Surgery) | Admitting: Thoracic Surgery (Cardiothoracic Vascular Surgery)

## 2018-02-08 DIAGNOSIS — R0781 Pleurodynia: Secondary | ICD-10-CM

## 2018-02-08 DIAGNOSIS — J984 Other disorders of lung: Secondary | ICD-10-CM | POA: Insufficient documentation

## 2018-02-08 DIAGNOSIS — J9811 Atelectasis: Secondary | ICD-10-CM | POA: Diagnosis not present

## 2018-02-08 DIAGNOSIS — J939 Pneumothorax, unspecified: Secondary | ICD-10-CM | POA: Diagnosis not present

## 2018-02-08 DIAGNOSIS — J479 Bronchiectasis, uncomplicated: Secondary | ICD-10-CM | POA: Insufficient documentation

## 2018-02-08 DIAGNOSIS — I700 Atherosclerosis of aorta: Secondary | ICD-10-CM | POA: Diagnosis not present

## 2018-02-19 ENCOUNTER — Encounter: Payer: Self-pay | Admitting: Thoracic Surgery (Cardiothoracic Vascular Surgery)

## 2018-02-19 ENCOUNTER — Other Ambulatory Visit: Payer: Self-pay

## 2018-02-19 ENCOUNTER — Ambulatory Visit: Payer: 59 | Admitting: Thoracic Surgery (Cardiothoracic Vascular Surgery)

## 2018-02-19 VITALS — BP 90/60 | HR 82 | Resp 16 | Ht 63.0 in | Wt 102.0 lb

## 2018-02-19 DIAGNOSIS — Z902 Acquired absence of lung [part of]: Secondary | ICD-10-CM

## 2018-02-19 DIAGNOSIS — R0781 Pleurodynia: Secondary | ICD-10-CM

## 2018-02-19 NOTE — Progress Notes (Signed)
[address]       [place] [address]             [phone]     HPI: Ms. Ruben returns for follow-up regarding her chest wall pain  Ms. Glynn is a 47 year old woman with a history of tobacco abuse who had a thoracoscopic right upper lobectomy in November 2018 for stage Ia non-small cell carcinoma.  She had a prolonged air leak postoperatively but otherwise her course was uncomplicated.  She saw Dr. Julien Nordmann from oncology, but did not require adjuvant therapy.  She has had problems with coughing spasms since surgery.  About 6 weeks ago she had a severe coughing spell and developed right-sided chest pain posteriorly.  Since then her coughing has improved but her pain persisted.  She did not have that much pain initially after the surgery and was concerned that this could be a recurrence of the cancer.  It seems more musculoskeletal in character, but since it has been about 6 months since her surgery, I recommended we do a CT to look further at that area.  Since I saw her a couple weeks ago her pain has improved but has not resolved.  She is continuing to take Aleve twice a day.  She is taking one oxycodone tablet at night to help her get to sleep.  Past Medical History:  Diagnosis Date  . Blood dyscrasia   . COPD (chronic obstructive pulmonary disease) ([place])   . Current smoker   . Difficult intubation    [facility]  . 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 Headache    "weekly" (09/04/2017)  . Heart murmur   . Lung cancer ([place]) dx'd 08/2017  . Pre-diabetes   . Rapid resting heart rate    happens infrequently   Current Outpatient Medications  Medication Sig Dispense Refill  . acetaminophen (TYLENOL) 500 MG tablet Take 1,000 mg by mouth every 6 (six) hours as needed.    . naproxen sodium (ALEVE) 220 MG tablet Take 220 mg by mouth 2 (two) times daily as needed.    Marland Kitchen oxycodone (OXY-IR) 5 MG capsule Take 5-10 mg by  mouth every 6 (six) hours as needed.      No current facility-administered medications for this visit.     Physical Exam BP 90/60 (BP Location: Left Arm, Patient Position: Sitting, Cuff Size: Normal)   Pulse 82   Resp 16   Ht 5\' 3"  (1.6 m)   Wt 102 lb (46.3 kg)   SpO2 99% Comment: ON RA  BMI 18.26 kg/m  47 year old woman in no acute distress Alert and oriented x3 with no focal deficits Incisions well-healed Lungs clear with equal breath sounds bilaterally Point tenderness along seventh and eighth ribs posteriorly  Diagnostic Tests: CT CHEST WITHOUT CONTRAST  TECHNIQUE: Multidetector CT imaging of the chest was performed following the standard protocol without IV contrast.  COMPARISON:  Chest radiographs dated 02/05/2018. Chest CT dated 07/13/2017.  FINDINGS: Cardiovascular: Mild atheromatous arterial calcifications, including the thoracic aorta. Normal sized heart. No pericardial effusion.  Mediastinum/Nodes: No enlarged mediastinal or axillary lymph nodes. Thyroid gland, trachea, and esophagus demonstrate no significant findings.  Lungs/Pleura: Interval post right upper lobectomy changes. Atelectasis and cylindrical bronchiectasis in the small amount of remaining right upper lobe or right middle lobe. A small loculated right apical pneumothorax is again demonstrated. There is also mild left apical pleural and parenchymal  scarring with small adjacent bullae. The previously demonstrated right upper lobe nodule is no longer present following right upper lobectomy. No new lung nodules are seen.  Upper Abdomen: Unremarkable.  Musculoskeletal: No acute or old rib fractures. Minimal thoracic spine degenerative changes.  IMPRESSION: 1. Stable small right apical loculated pneumothorax with a surgical staple line along the superior margin of the lung at that location. 2. Atelectasis and bronchiectasis in a small amount of remaining right upper lobe/right middle  lobe. 3. Mild left apical pleural and parenchymal scarring and bullous changes. 4. No rib fracture seen  Aortic Atherosclerosis (ICD10-I70.0).   Electronically Signed   By: Claudie Revering M.D.   On: 02/08/2018 18:27 I personally reviewed the CT images and concur with the findings noted above.  Impression: Ms. Farrugia is a 47 year old woman who had a thoracoscopic right upper lobectomy for stage IA non-small cell carcinoma about 6 months ago.  She developed acute onset of right posterior lateral chest wall pain about 6 weeks ago after a severe coughing spell.  Chest x-ray did not show any evidence of rib fracture.  There was no pleural effusion.  Since she is 6 months out from surgery ordered a CT to further assess the area and it shows some atelectasis in the right middle lobe.  There is no evidence of any rib fracture.  There is some mild degenerative change in the thoracic spine but I do not think that is enough to account for her pain.  I suspect that the pain is musculoskeletal.  She has a scheduled follow-up with Dr. Julien Nordmann in July.  I will plan to see her back in 6 months for a one-year follow-up.  Tobacco abuse- has not smoked since surgery. Smoking cessation instruction/counseling given:  commended patient for quitting and reviewed strategies for preventing relapses  Plan: She knows to call if she has any further issues.  [name], MD [facility] [phone]

## 2018-02-26 ENCOUNTER — Other Ambulatory Visit: Payer: Self-pay

## 2018-02-26 ENCOUNTER — Ambulatory Visit (INDEPENDENT_AMBULATORY_CARE_PROVIDER_SITE_OTHER): Payer: 59 | Admitting: Internal Medicine

## 2018-02-26 ENCOUNTER — Encounter: Payer: 59 | Admitting: Internal Medicine

## 2018-02-26 ENCOUNTER — Encounter: Payer: Self-pay | Admitting: Internal Medicine

## 2018-02-26 VITALS — BP 104/75 | HR 72 | Temp 98.6°F | Ht 63.0 in | Wt 101.8 lb

## 2018-02-26 DIAGNOSIS — R634 Abnormal weight loss: Secondary | ICD-10-CM

## 2018-02-26 DIAGNOSIS — R0781 Pleurodynia: Secondary | ICD-10-CM | POA: Diagnosis not present

## 2018-02-26 DIAGNOSIS — Z23 Encounter for immunization: Secondary | ICD-10-CM

## 2018-02-26 DIAGNOSIS — Z902 Acquired absence of lung [part of]: Secondary | ICD-10-CM | POA: Diagnosis not present

## 2018-02-26 DIAGNOSIS — F17200 Nicotine dependence, unspecified, uncomplicated: Secondary | ICD-10-CM

## 2018-02-26 DIAGNOSIS — Z0000 Encounter for general adult medical examination without abnormal findings: Secondary | ICD-10-CM | POA: Diagnosis not present

## 2018-02-26 DIAGNOSIS — R0789 Other chest pain: Secondary | ICD-10-CM

## 2018-02-26 NOTE — Assessment & Plan Note (Addendum)
-  Tetanus shot given today -Patient referred to [facility] today for her Pap smear

## 2018-02-26 NOTE — Assessment & Plan Note (Signed)
-  Patient has not smoked since her surgery in November -She was congratulated on this and encouraged to continue to abstain from smoking

## 2018-02-26 NOTE — Assessment & Plan Note (Signed)
-   patient has a history of stage Ia adenocarcinoma of her lung status post right upper lobectomy in November 2018 -She recently followed up with cardiothoracic surgery this month and had a repeat CT scan which showed no recurrence of her cancer and a small stable apical pneumothorax -Patient still has some mild residual right-sided rib pain for which she occasionally takes OxyIR at night but states that this pain has improved -She has follow-up with oncology in July -She does have a mild chronic cough but states this is also improving -She has no new complaints at this time We will continue to monitor closely-

## 2018-02-26 NOTE — Progress Notes (Signed)
   Subjective:    Patient ID: Heather [name], female    DOB: October 16, 1970, 47 y.o.   MRN: [id number]  HPI I have seen and examined this patient.  Patient is here for routine health maintenance exam.  Patient has no new complaints today.  She states she has not been smoking since her surgery for her lung cancer.  States that her rib pain is doing much better as well.  She has no new complaints at this time.  She is compliant with all her medications.   Review of Systems  Constitutional: Negative.   HENT: Negative.   Respiratory: Negative.   Cardiovascular: Negative.   Gastrointestinal: Negative.   Musculoskeletal: Negative.   Neurological: Negative.   Psychiatric/Behavioral: Negative.        Objective:   Physical Exam  Constitutional: She is oriented to person, place, and time. She appears well-developed and well-nourished.  HENT:  Head: Normocephalic and atraumatic.  Mouth/Throat: No oropharyngeal exudate.  Neck: Neck supple.  Cardiovascular: Normal rate, regular rhythm and normal heart sounds.  Pulmonary/Chest: Effort normal and breath sounds normal. No stridor. She has no wheezes. She has no rales.  Abdominal: Soft. Bowel sounds are normal. She exhibits no distension. There is no tenderness.  Musculoskeletal: Normal range of motion. She exhibits no edema.  Lymphadenopathy:    She has no cervical adenopathy.  Neurological: She is alert and oriented to person, place, and time.  Psychiatric: She has a normal mood and affect. Her behavior is normal.          Assessment & Plan:  Please see problem based charting for assessment and plan:

## 2018-02-26 NOTE — Assessment & Plan Note (Signed)
-  This pain is improved substantially -Patient states that she only takes OxyIR at night as needed -She has no other complaints at this time

## 2018-02-26 NOTE — Assessment & Plan Note (Signed)
-  This problem is chronic and stable -Patient's weight has now stabilized and her weight is now 101 pounds -We will continue to monitor her closely -She is encouraged to eat small, frequent meals throughout the day -She does not like boost or Ensure and cannot take this

## 2018-02-26 NOTE — Patient Instructions (Signed)
-  It was a pleasure seeing you today -Please follow-up with oncology  -We will give you a tetanus shot today -Please continue to try to eat and increase her weight -We will refer [facility] to [facility] for Pap smear

## 2018-03-22 ENCOUNTER — Encounter (HOSPITAL_COMMUNITY): Payer: Self-pay | Admitting: Emergency Medicine

## 2018-03-22 ENCOUNTER — Ambulatory Visit (HOSPITAL_COMMUNITY)
Admission: EM | Admit: 2018-03-22 | Discharge: 2018-03-22 | Disposition: A | Discharge status: Home / Self Care | Payer: 59 | Attending: Internal Medicine | Admitting: Internal Medicine

## 2018-03-22 DIAGNOSIS — Z85118 Personal history of other malignant neoplasm of bronchus and lung: Secondary | ICD-10-CM | POA: Insufficient documentation

## 2018-03-22 DIAGNOSIS — Z886 Allergy status to analgesic agent status: Secondary | ICD-10-CM | POA: Diagnosis not present

## 2018-03-22 DIAGNOSIS — Z79899 Other long term (current) drug therapy: Secondary | ICD-10-CM | POA: Diagnosis not present

## 2018-03-22 DIAGNOSIS — D682 Hereditary deficiency of other clotting factors: Secondary | ICD-10-CM | POA: Insufficient documentation

## 2018-03-22 DIAGNOSIS — K0510 Chronic gingivitis, plaque induced: Secondary | ICD-10-CM | POA: Diagnosis not present

## 2018-03-22 DIAGNOSIS — K069 Disorder of gingiva and edentulous alveolar ridge, unspecified: Secondary | ICD-10-CM | POA: Diagnosis not present

## 2018-03-22 DIAGNOSIS — Z87891 Personal history of nicotine dependence: Secondary | ICD-10-CM | POA: Diagnosis not present

## 2018-03-22 DIAGNOSIS — R109 Unspecified abdominal pain: Secondary | ICD-10-CM | POA: Diagnosis not present

## 2018-03-22 DIAGNOSIS — J449 Chronic obstructive pulmonary disease, unspecified: Secondary | ICD-10-CM | POA: Diagnosis not present

## 2018-03-22 DIAGNOSIS — N12 Tubulo-interstitial nephritis, not specified as acute or chronic: Secondary | ICD-10-CM | POA: Insufficient documentation

## 2018-03-22 DIAGNOSIS — R7303 Prediabetes: Secondary | ICD-10-CM | POA: Diagnosis not present

## 2018-03-22 DIAGNOSIS — R110 Nausea: Secondary | ICD-10-CM | POA: Diagnosis present

## 2018-03-22 DIAGNOSIS — R6884 Jaw pain: Secondary | ICD-10-CM | POA: Diagnosis present

## 2018-03-22 LAB — POCT URINALYSIS DIP (DEVICE)
Glucose, UA: NEGATIVE mg/dL
NITRITE: NEGATIVE
PROTEIN: 100 mg/dL — AB
Specific Gravity, Urine: >=1.03 (ref 1.005–1.030)
UROBILINOGEN UA: 1 mg/dL (ref 0.0–1.0)
pH: 5.5 (ref 5.0–8.0)

## 2018-03-22 MED ORDER — LIDOCAINE VISCOUS HCL 2 % MT SOLN: 15.0000 mL | OROMUCOSAL | 0 refills | Status: DC | PRN

## 2018-03-22 MED ORDER — CEFTRIAXONE SODIUM 1 G IJ SOLR
INTRAMUSCULAR | Status: AC
  Filled 2018-03-22: qty 10

## 2018-03-22 MED ORDER — CEFTRIAXONE SODIUM 1 G IJ SOLR
1.0000 g | Freq: Once | INTRAMUSCULAR | Status: AC
  Administered 2018-03-22: 1 g via INTRAMUSCULAR

## 2018-03-22 MED ORDER — CIPROFLOXACIN HCL 500 MG PO TABS: 500.0000 mg | ORAL_TABLET | Freq: Two times a day (BID) | ORAL | 0 refills | Status: AC

## 2018-03-22 MED FILL — LIDOCAINE 2% VISCOUS SOLN: 2 | 7 days supply | Qty: 100 | Fill #0

## 2018-03-22 MED FILL — CIPROFLOXACIN HCL 500 MG TA: 500 | 7 days supply | Qty: 14 | Fill #0

## 2018-03-22 NOTE — ED Provider Notes (Signed)
[facility]   SUBJECTIVE:  Heather [name] is a 47 y.o. female who complains of left flank pain for the past 3 days.  Patient denies a precipitating event, recent sexual encounter, excessive caffeine intake.  Localizes the pain to the left flank.  Pain is constant and describes it as sharp.  Has tried OTC medications like tylenol without relief.  Denies aggravating or alleviating factors.  Denies similar symptoms in the past. Complains of fever with TMAX of 103 at home, chills, and nausea.   Patient also complains of intermittent left sided jaw pain and swelling that began yesterday.  Denies a precipitating event, but does admit to chewing on the right side.  Denies tooth pain, but admits to poor oral hygiene.   Denies vomiting, dysuria, urinary frequency or urgency, abnormal vaginal discharge or bleeding, hematuria.    LMP: No LMP recorded. Patient has had a hysterectomy.  ROS: As in HPI.  Past Medical History:  Diagnosis Date  . Blood dyscrasia   . COPD (chronic obstructive pulmonary disease) ([place])   . Current smoker   . Difficult intubation    [facility]  . 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 Headache    "weekly" (09/04/2017)  . Heart murmur   . Lung cancer ([place]) dx'd 08/2017  . Pre-diabetes   . Rapid resting heart rate    happens infrequently   Past Surgical History:  Procedure Laterality Date  . CESAREAN SECTION  1992; 2012  . COLONOSCOPY N/A 05/15/2014   Procedure: COLONOSCOPY;  Surgeon: Lafayette Dragon, MD;  Location: [facility];  Service: Endoscopy;  Laterality: N/A;  . ESOPHAGOGASTRODUODENOSCOPY N/A 05/15/2014   Procedure: ESOPHAGOGASTRODUODENOSCOPY (EGD);  Surgeon: Lafayette Dragon, MD;  Location: [facility];  Service: Endoscopy;  Laterality: N/A;  . LAPAROSCOPY  07/07/2011   Procedure: LAPAROSCOPY DIAGNOSTIC;  Surgeon: Elveria Royals;  Location: [facility];  Service: Gynecology;  Laterality: N/A;  . LESION  REMOVAL  07/07/2011   Procedure: EXCISION VAGINAL LESION;  Surgeon: Elveria Royals;  Location: [facility];  Service: Gynecology;  Laterality: N/A;  . LOBECTOMY Right 08/20/2017   Procedure: RIGHT UPPER LOBE LOBECTOMY;  Surgeon: [name], MD;  Location: [facility];  Service: Thoracic;  Laterality: Right;  . LYMPH NODE DISSECTION N/A 08/20/2017   Procedure: LYMPH NODE DISSECTION;  Surgeon: [name], MD;  Location: [facility];  Service: Thoracic;  Laterality: N/A;  . VAGINAL HYSTERECTOMY  ~ 2014  . VIDEO ASSISTED THORACOSCOPY (VATS)/WEDGE RESECTION Right 08/20/2017   Procedure: RIGHT VIDEO ASSISTED THORACOSCOPY (VATS)/WEDGE RESECTION;  Surgeon: [name], MD;  Location: [facility];  Service: Thoracic;  Laterality: Right;   Allergies  Allergen Reactions  . Iohexol Swelling  . Aspirin Nausea And Vomiting   No current facility-administered medications on file prior to encounter.    Current Outpatient Medications on File Prior to Encounter  Medication Sig Dispense Refill  . acetaminophen (TYLENOL) 500 MG tablet Take 1,000 mg by mouth every 6 (six) hours as needed.    . naproxen sodium (ALEVE) 220 MG tablet Take 220 mg by mouth 2 (two) times daily as needed.    Marland Kitchen oxycodone (OXY-IR) 5 MG capsule Take 5-10 mg by mouth every 6 (six) hours as needed.      Social History   Socioeconomic History  . Marital status: Divorced    Spouse name: Not on file  . Number of children: Not on file  .  Years of education: Not on file  . Highest education level: Not on file  Occupational History  . Not on file  Social Needs  . Financial resource strain: Somewhat hard  . Food insecurity:    Worry: Never true    Inability: Never true  . Transportation needs:    Medical: No    Non-medical: No  Tobacco Use  . Smoking status: Former Smoker    Packs/day: 0.75    Years: 34.00    Pack years: 25.50    Types: Cigarettes    Last attempt to quit: 08/17/2017    Years since quitting: 0.5  .  Smokeless tobacco: Never Used  . Tobacco comment: Cannot afford Chantex.  Cutting back 3/4 pk  Substance and Sexual Activity  . Alcohol use: Yes    Alcohol/week: 0.0 oz    Comment: 09/04/2017 "a mixed drink a few times/year"  . Drug use: No  . Sexual activity: Not Currently  Lifestyle  . Physical activity:    Days per week: 3 days    Minutes per session: 60 min  . Stress: Very much  Relationships  . Social connections:    Talks on phone: Once a week    Gets together: More than three times a week    Attends religious service: Never    Active member of club or organization: No    Attends meetings of clubs or organizations: Never    Relationship status: Divorced  . Intimate partner violence:    Fear of current or ex partner: No    Emotionally abused: No    Physically abused: No    Forced sexual activity: No  Other Topics Concern  . Not on file  Social History Narrative  . Not on file   No family history on file.  OBJECTIVE:  Vitals:   03/22/18 1412  BP: 122/85  Pulse: (!) 113  Resp: 18  Temp: 98.6 F (37 C)  SpO2: 98%   General appearance: AOx3; appears fatigued HEENT: NCAT.  Mouth: Poor dentition with multiple dental caries, swollen gums right lower side with tenderness, no erythema apprecipated; tenderness with palpation about the left side of mandible; opens and closes mouth without difficulty; no trismus; voice normal; uvula midline Throat: No LAD. Lungs: clear to auscultation bilaterally without adventitious breath sounds Heart: regular rate and rhythm.  Radial pulses 2+ symmetrical bilaterally Abdomen: soft; non-distended; tender about the left flank; bowel sounds present; no masses or organomegaly; guarding; no rebound tenderness Back: Left sided CVA tenderness Extremities: no edema; symmetrical with no gross deformities Skin: warm and dry Neurologic: Ambulates from chair to exam table without difficulty Psychological: alert and cooperative; normal mood and  affect  Labs Reviewed  POCT URINALYSIS DIP (DEVICE) - Abnormal; Notable for the following components:      Result Value   Bilirubin Urine SMALL (*)    Ketones, ur TRACE (*)    Hgb urine dipstick LARGE (*)    Protein, ur 100 (*)    Leukocytes, UA TRACE (*)    All other components within normal limits    ASSESSMENT & PLAN:  1. Pyelonephritis   2. Flank pain   3. Gingivitis     Meds ordered this encounter  Medications  . cefTRIAXone (ROCEPHIN) injection 1 g  . ciprofloxacin (CIPRO) 500 MG tablet    Sig: Take 1 tablet (500 mg total) by mouth every 12 (twelve) hours for 7 days.    Dispense:  14 tablet    Refill:  0    Order Specific Question:   Supervising Provider    Answer:   Wynona Luna [[id number]]  . lidocaine (XYLOCAINE) 2 % solution    Sig: Use as directed 15 mLs in the mouth or throat as needed for mouth pain.    Dispense:  100 mL    Refill:  0    Order Specific Question:   Supervising Provider    Answer:   Wynona Luna [[id number]]   Urine showed signs of infection.  Urine culture sent.   1 gram of rocephin given in office Prescribed ciprofloxacin.  Take as directed and to completion Viscous lidocaine prescribed.  Use as needed for mouth pain Continue OTC medications as needed for pain relief Follow up with PCP in the next two week for reevaluation and to ensure symptoms are resolving Return or go to the ED if you have any new or worsening symptoms  Outlined signs and symptoms indicating need for more acute intervention. Patient verbalized understanding. After Visit Summary given.     Lestine Box, PA-C 03/22/18 1542

## 2018-03-22 NOTE — ED Triage Notes (Signed)
Pt c/o L sided facial swelling, and L sided flank pain x3 days.

## 2018-03-22 NOTE — ED Notes (Signed)
Bed: UC01 Expected date:  Expected time:  Means of arrival:  Comments:

## 2018-03-22 NOTE — Discharge Instructions (Addendum)
Urine showed signs of infection.  Urine culture sent.   1 gram of rocephin given in office Prescribed ciprofloxacin.  Take as directed and to completion Viscous lidocaine prescribed.  Use as needed for mouth pain Continue OTC medications as needed for pain relief Follow up with PCP in the next two week for reevaluation and to ensure symptoms are resolving Return or go to the ED if you have any new or worsening symptoms

## 2018-03-23 LAB — URINE CULTURE: Culture: <10000 — AB

## 2018-04-10 ENCOUNTER — Encounter: Payer: Self-pay | Admitting: Internal Medicine

## 2018-04-15 ENCOUNTER — Inpatient Hospital Stay: Payer: 59 | Attending: Internal Medicine

## 2018-04-15 DIAGNOSIS — Z85118 Personal history of other malignant neoplasm of bronchus and lung: Secondary | ICD-10-CM | POA: Insufficient documentation

## 2018-04-15 DIAGNOSIS — C3490 Malignant neoplasm of unspecified part of unspecified bronchus or lung: Secondary | ICD-10-CM

## 2018-04-15 DIAGNOSIS — Z902 Acquired absence of lung [part of]: Secondary | ICD-10-CM | POA: Diagnosis not present

## 2018-04-15 DIAGNOSIS — R5383 Other fatigue: Secondary | ICD-10-CM | POA: Diagnosis not present

## 2018-04-15 LAB — CBC WITH DIFFERENTIAL (CANCER CENTER ONLY)
BASOS ABS: 0 10*3/uL (ref 0.0–0.1)
BASOS PCT: 1 %
EOS ABS: 0 10*3/uL (ref 0.0–0.5)
Eosinophils Relative: 0 %
HCT: 36.9 % (ref 34.8–46.6)
Hemoglobin: 11.9 g/dL (ref 11.6–15.9)
Lymphocytes Relative: 19 %
Lymphs Abs: 1.1 10*3/uL (ref 0.9–3.3)
MCH: 30.2 pg (ref 25.1–34.0)
MCHC: 32.2 g/dL (ref 31.5–36.0)
MCV: 93.7 fL (ref 79.5–101.0)
Monocytes Absolute: 0.4 10*3/uL (ref 0.1–0.9)
Monocytes Relative: 7 %
Neutro Abs: 4.2 10*3/uL (ref 1.5–6.5)
Neutrophils Relative %: 73 %
Platelet Count: 151 10*3/uL (ref 145–400)
RBC: 3.94 MIL/uL (ref 3.70–5.45)
RDW: 15 % — AB (ref 11.2–14.5)
WBC: 5.8 10*3/uL (ref 3.9–10.3)

## 2018-04-15 LAB — CMP (CANCER CENTER ONLY)
ALK PHOS: 67 U/L (ref 38–126)
ALT: 13 U/L (ref 0–44)
AST: 14 U/L — AB (ref 15–41)
Albumin: 4.3 g/dL (ref 3.5–5.0)
Anion gap: 8 (ref 5–15)
BILIRUBIN TOTAL: 0.5 mg/dL (ref 0.3–1.2)
BUN: 8 mg/dL (ref 6–20)
CALCIUM: 9.5 mg/dL (ref 8.9–10.3)
CO2: 26 mmol/L (ref 22–32)
CREATININE: 0.71 mg/dL (ref 0.44–1.00)
Chloride: 108 mmol/L (ref 98–111)
GFR, Est AFR Am: >60 mL/min (ref >60)
Glucose, Bld: 95 mg/dL (ref 70–99)
POTASSIUM: 4.2 mmol/L (ref 3.5–5.1)
Sodium: 142 mmol/L (ref 135–145)
TOTAL PROTEIN: 7.1 g/dL (ref 6.5–8.1)

## 2018-04-17 ENCOUNTER — Inpatient Hospital Stay: Payer: 59 | Admitting: Internal Medicine

## 2018-04-17 ENCOUNTER — Telehealth: Payer: Self-pay | Admitting: Internal Medicine

## 2018-04-17 ENCOUNTER — Encounter: Payer: Self-pay | Admitting: Internal Medicine

## 2018-04-17 VITALS — BP 121/68 | HR 78 | Temp 98.1°F | Resp 17 | Ht 63.0 in | Wt 104.9 lb

## 2018-04-17 DIAGNOSIS — C3490 Malignant neoplasm of unspecified part of unspecified bronchus or lung: Secondary | ICD-10-CM

## 2018-04-17 DIAGNOSIS — R5383 Other fatigue: Secondary | ICD-10-CM | POA: Diagnosis not present

## 2018-04-17 DIAGNOSIS — Z85118 Personal history of other malignant neoplasm of bronchus and lung: Secondary | ICD-10-CM

## 2018-04-17 DIAGNOSIS — Z902 Acquired absence of lung [part of]: Secondary | ICD-10-CM

## 2018-04-17 NOTE — Progress Notes (Signed)
[facility] [phone]   [phone]  OFFICE PROGRESS NOTE  Aldine Contes, MD [address], [address] [place] [address]  DIAGNOSIS: Stage IA (T1a, N0, M0) moderately differentiated adenocarcinoma diagnosed in November 2018.  PRIOR THERAPY: Status post right upper lobectomy with mediastinal lymph node dissection under the care of Dr. Roxan Hockey on August 20, 2017.  CURRENT THERAPY: Observation.  INTERVAL HISTORY: Heather [name] 47 y.o. female returns to the clinic today for six-month follow-up visit.  The patient is feeling fine today with no specific complaints except for mild fatigue.  She denied having any chest pain, shortness breath, cough or hemoptysis.  She denied having any recent weight loss or night sweats.  She has no nausea, vomiting, diarrhea or constipation.  She is here today for evaluation and discussion of her recent scan results.  MEDICAL HISTORY: Past Medical History:  Diagnosis Date  . Blood dyscrasia   . COPD (chronic obstructive pulmonary disease) ([place])   . Current smoker   . Difficult intubation    pt was told [facility]  . 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 Headache    "weekly" (09/04/2017)  . Heart murmur   . Lung cancer ([place]) dx'd 08/2017  . Pre-diabetes   . Rapid resting heart rate    happens infrequently    ALLERGIES:  is allergic to iohexol and aspirin.  MEDICATIONS:  Current Outpatient Medications  Medication Sig Dispense Refill  . acetaminophen (TYLENOL) 500 MG tablet Take 1,000 mg by mouth every 6 (six) hours as needed.    . lidocaine (XYLOCAINE) 2 % solution Use as directed 15 mLs in the mouth or throat as needed for mouth pain. 100 mL 0  . naproxen sodium (ALEVE) 220 MG tablet Take 220 mg by mouth 2 (two) times daily as needed.    Marland Kitchen oxycodone (OXY-IR) 5 MG capsule Take 5-10 mg by mouth every 6 (six) hours as needed.      No current  facility-administered medications for this visit.     SURGICAL HISTORY:  Past Surgical History:  Procedure Laterality Date  . CESAREAN SECTION  1992; 2012  . COLONOSCOPY N/A 05/15/2014   Procedure: COLONOSCOPY;  Surgeon: Lafayette Dragon, MD;  [facility]: [facility];  Service: Endoscopy;  Laterality: N/A;  . ESOPHAGOGASTRODUODENOSCOPY N/A 05/15/2014   Procedure: ESOPHAGOGASTRODUODENOSCOPY (EGD);  Surgeon: Lafayette Dragon, MD;  Location: [facility];  Service: Endoscopy;  Laterality: N/A;  . LAPAROSCOPY  07/07/2011   Procedure: LAPAROSCOPY DIAGNOSTIC;  Surgeon: Elveria Royals;  Location: [facility];  Service: Gynecology;  Laterality: N/A;  . LESION REMOVAL  07/07/2011   Procedure: EXCISION VAGINAL LESION;  Surgeon: Elveria Royals;  Location: [facility];  Service: Gynecology;  Laterality: N/A;  . LOBECTOMY Right 08/20/2017   Procedure: RIGHT UPPER LOBE LOBECTOMY;  Surgeon: [name], MD;  Location: [facility];  Service: Thoracic;  Laterality: Right;  . LYMPH NODE DISSECTION N/A 08/20/2017   Procedure: LYMPH NODE DISSECTION;  Surgeon: [name], MD;  Location: [facility];  Service: Thoracic;  Laterality: N/A;  . VAGINAL HYSTERECTOMY  ~ 2014  . VIDEO ASSISTED THORACOSCOPY (VATS)/WEDGE RESECTION Right 08/20/2017   Procedure: RIGHT VIDEO ASSISTED THORACOSCOPY (VATS)/WEDGE RESECTION;  Surgeon: [name], MD;  Location: [facility];  Service: Thoracic;  Laterality: Right;    REVIEW OF SYSTEMS:  A comprehensive review of systems was negative except for: Constitutional: positive for  fatigue   PHYSICAL EXAMINATION: General appearance: alert, cooperative and no distress Head: Normocephalic, without obvious abnormality, atraumatic Neck: no adenopathy, no JVD, supple, symmetrical, trachea midline and thyroid not enlarged, symmetric, no tenderness/mass/nodules Lymph nodes: Cervical, supraclavicular, and axillary nodes normal. Resp: clear to auscultation bilaterally Back: symmetric, no  curvature. ROM normal. No CVA tenderness. Cardio: regular rate and rhythm, S1, S2 normal, no murmur, click, rub or gallop GI: soft, non-tender; bowel sounds normal; no masses,  no organomegaly Extremities: extremities normal, atraumatic, no cyanosis or edema  ECOG PERFORMANCE STATUS: 1 - Symptomatic but completely ambulatory  Blood pressure 121/68, pulse 78, temperature 98.1 F (36.7 C), temperature source Oral, resp. rate 17, height 5\' 3"  (1.6 m), weight 104 lb 14.4 oz (47.6 kg), SpO2 100 %.  LABORATORY DATA: Lab Results  Component Value Date   WBC 5.8 04/15/2018   HGB 11.9 04/15/2018   HCT 36.9 04/15/2018   MCV 93.7 04/15/2018   PLT 151 04/15/2018      Chemistry      Component Value Date/Time   NA 142 04/15/2018 1056   NA 141 07/31/2017 1054   K 4.2 04/15/2018 1056   K 3.6 07/31/2017 1054   CL 108 04/15/2018 1056   CO2 26 04/15/2018 1056   CO2 23 07/31/2017 1054   BUN 8 04/15/2018 1056   BUN 5.9 (L) 07/31/2017 1054   CREATININE 0.71 04/15/2018 1056   CREATININE 0.8 07/31/2017 1054      Component Value Date/Time   CALCIUM 9.5 04/15/2018 1056   CALCIUM 9.9 07/31/2017 1054   ALKPHOS 67 04/15/2018 1056   ALKPHOS 50 07/31/2017 1054   AST 14 (L) 04/15/2018 1056   AST 12 07/31/2017 1054   ALT 13 04/15/2018 1056   ALT 9 07/31/2017 1054   BILITOT 0.5 04/15/2018 1056   BILITOT 0.69 07/31/2017 1054       RADIOGRAPHIC STUDIES: No results found.  ASSESSMENT AND PLAN: This is a very pleasant 47 years old white female recently diagnosed with a stage IA non-small cell lung cancer, adenocarcinoma status post right upper lobectomy with lymph node dissection in November 2018 under the care of Dr. Roxan Hockey. The patient is currently on observation and she is feeling fine.  Her recent CT scan of the chest performed in May 2019 showed no concerning findings for disease recurrence.  I discussed the scan results with the patient and recommended for her to continue on observation  with repeat CT scan of the chest in December 2019. She was advised to call immediately if she has any concerning symptoms in the interval. The patient voices understanding of current disease status and treatment options and is in agreement with the current care plan.  All questions were answered. The patient knows to call the clinic with any problems, questions or concerns. We can certainly see the patient much sooner if necessary.  I spent 10 minutes counseling the patient face to face. The total time spent in the appointment was 15 minutes.  Disclaimer: This note was dictated with voice recognition software. Similar sounding words can inadvertently be transcribed and may not be corrected upon review.

## 2018-04-17 NOTE — Telephone Encounter (Signed)
Scheduled appt per 7/17 los - reminder letter sent in the mail

## 2018-04-19 ENCOUNTER — Encounter: Payer: Self-pay | Admitting: Internal Medicine

## 2018-04-23 ENCOUNTER — Encounter: Payer: 59 | Admitting: Thoracic Surgery (Cardiothoracic Vascular Surgery)

## 2018-06-13 ENCOUNTER — Encounter (HOSPITAL_COMMUNITY): Payer: Self-pay

## 2018-06-13 ENCOUNTER — Encounter: Payer: Self-pay | Admitting: Internal Medicine

## 2018-06-13 ENCOUNTER — Other Ambulatory Visit: Payer: Self-pay

## 2018-06-13 ENCOUNTER — Ambulatory Visit (HOSPITAL_COMMUNITY)
Admission: RE | Admit: 2018-06-13 | Discharge: 2018-06-13 | Disposition: A | Discharge status: Home / Self Care | Payer: 59 | Source: Ambulatory Visit | Attending: Family Medicine | Admitting: Family Medicine

## 2018-06-13 ENCOUNTER — Ambulatory Visit (INDEPENDENT_AMBULATORY_CARE_PROVIDER_SITE_OTHER): Payer: 59 | Admitting: Internal Medicine

## 2018-06-13 ENCOUNTER — Emergency Department (HOSPITAL_COMMUNITY)
Admission: EM | Admit: 2018-06-13 | Discharge: 2018-06-13 | Discharge status: Against Medical Advice | Payer: 59 | Attending: Emergency Medicine | Admitting: Emergency Medicine

## 2018-06-13 ENCOUNTER — Emergency Department (HOSPITAL_COMMUNITY): Payer: 59

## 2018-06-13 VITALS — BP 118/68 | HR 91 | Temp 98.1°F | Wt 107.7 lb

## 2018-06-13 DIAGNOSIS — R0789 Other chest pain: Secondary | ICD-10-CM

## 2018-06-13 DIAGNOSIS — Z79899 Other long term (current) drug therapy: Secondary | ICD-10-CM | POA: Insufficient documentation

## 2018-06-13 DIAGNOSIS — R0781 Pleurodynia: Secondary | ICD-10-CM

## 2018-06-13 DIAGNOSIS — R072 Precordial pain: Secondary | ICD-10-CM | POA: Insufficient documentation

## 2018-06-13 DIAGNOSIS — D6851 Activated protein C resistance: Secondary | ICD-10-CM

## 2018-06-13 DIAGNOSIS — J449 Chronic obstructive pulmonary disease, unspecified: Secondary | ICD-10-CM | POA: Diagnosis not present

## 2018-06-13 DIAGNOSIS — Z85118 Personal history of other malignant neoplasm of bronchus and lung: Secondary | ICD-10-CM | POA: Diagnosis not present

## 2018-06-13 DIAGNOSIS — Z902 Acquired absence of lung [part of]: Secondary | ICD-10-CM

## 2018-06-13 DIAGNOSIS — R0602 Shortness of breath: Secondary | ICD-10-CM | POA: Insufficient documentation

## 2018-06-13 DIAGNOSIS — R079 Chest pain, unspecified: Secondary | ICD-10-CM | POA: Diagnosis not present

## 2018-06-13 DIAGNOSIS — Z87891 Personal history of nicotine dependence: Secondary | ICD-10-CM | POA: Insufficient documentation

## 2018-06-13 DIAGNOSIS — R002 Palpitations: Secondary | ICD-10-CM

## 2018-06-13 HISTORY — DX: Pleurodynia: R07.81

## 2018-06-13 LAB — CBC
HEMATOCRIT: 42.6 % (ref 36.0–46.0)
HEMOGLOBIN: 13.4 g/dL (ref 12.0–15.0)
MCH: 30.2 pg (ref 26.0–34.0)
MCHC: 31.5 g/dL (ref 30.0–36.0)
MCV: 95.9 fL (ref 78.0–100.0)
Platelets: 152 10*3/uL (ref 150–400)
RBC: 4.44 MIL/uL (ref 3.87–5.11)
RDW: 13.3 % (ref 11.5–15.5)
WBC: 6.9 10*3/uL (ref 4.0–10.5)

## 2018-06-13 LAB — BASIC METABOLIC PANEL
ANION GAP: 12 (ref 5–15)
BUN: 9 mg/dL (ref 6–20)
CHLORIDE: 104 mmol/L (ref 98–111)
CO2: 26 mmol/L (ref 22–32)
Calcium: 9.8 mg/dL (ref 8.9–10.3)
Creatinine, Ser: 0.7 mg/dL (ref 0.44–1.00)
GFR calc Af Amer: >60 mL/min (ref >60)
GFR calc non Af Amer: >60 mL/min (ref >60)
Glucose, Bld: 110 mg/dL — ABNORMAL HIGH (ref 70–99)
POTASSIUM: 4.4 mmol/L (ref 3.5–5.1)
SODIUM: 142 mmol/L (ref 135–145)

## 2018-06-13 LAB — I-STAT TROPONIN, ED: Troponin i, poc: 0.01 ng/mL (ref 0.00–0.08)

## 2018-06-13 MED ORDER — ENOXAPARIN SODIUM 80 MG/0.8ML ~~LOC~~ SOLN
1.5000 mg/kg | Freq: Once | SUBCUTANEOUS | Status: AC
  Administered 2018-06-13: 75 mg via SUBCUTANEOUS
  Filled 2018-06-13: qty 0.75

## 2018-06-13 NOTE — Progress Notes (Signed)
Medicine attending: Medical history, presenting problems, physical findings, and medications, reviewed with resident physician Dr Alphonzo Grieve on the day of the patient visit and I concur with her evaluation and management plan. Progressive cough, left pleuritic chest pain, hx stage I NSC lung cancer resected 11/18. She is at Increased risk for PE. Will get Ct angio today.

## 2018-06-13 NOTE — ED Notes (Signed)
Pt unable to wait. EDP notified. Pt to leave AMA. Pt notified that our recommendations are to return to ED.

## 2018-06-13 NOTE — ED Triage Notes (Addendum)
Pt presents with 2 week h/o L sided chest pain.  Pt reports pain has been constant and radiates into L scapula with deep inspiration. Pt reports episode last night of hard palpitations that lasted a few minutes.  Pt reports shortness of breath, reports productive cough with clear phlegm.  Pt had lobectomy in November 2018 for lung CA to L side.  Pt reports BLE swelling x 2 months with L leg > R leg.  Denies any recent travel.

## 2018-06-13 NOTE — Assessment & Plan Note (Addendum)
Patient with recent <21yr h/o adenocarcinoma of the lung s/p resection, h/o FVL deficiency presenting with progressive left sided pleurtic chest pain, shortness of breath, cough; no signs of DVT on exam. With RF there is concern for PE (Well's score 3 based on likelyhood of PE only).   Plan: --EKG  --initial plan was for stat CTA outpatient however due to IV contrast allergy she would require 13hr prep with prednisone and benadryl which would push imaging to tomorrow. Instead we will send to ED for 4hr protocol. RN with alert ED triage. Patient in agreement with plan.

## 2018-06-13 NOTE — ED Notes (Signed)
ED Provider at bedside. 

## 2018-06-13 NOTE — ED Provider Notes (Signed)
[facility] EMERGENCY DEPARTMENT Provider Note   CSN: [id number] Arrival date & time: 06/13/18  1204     History   Chief Complaint Chief Complaint  Patient presents with  . Chest Pain    HPI Arine Foley is a 47 y.o. female.  HPI   Sharran Caratachea is a 47 y.o. female, with a history of factor V deficiency, COPD, right upper lobectomy, presenting to the ED with shortness of breath worsening over the last 2 weeks.  Accompanied by pain near the left scapula, 6/10 at rest, worsens with deep inspiration, radiates through to the chest.  Accompanied by nausea.  She has not had similar pain in the past.  She does endorse a productive cough over the same time period. I tried to clarify with the patient whether she has factor V deficiency or factor V Leiden and she states she does not know.  She does state she had to administer Lovenox injections to herself during pregnancy. I spoke with the patient about her contrast dye allergy.  She states she had full body swelling and shortness of breath, then adds even when she was premedicated she had a full body rash Denies fever/chills, vomiting, diarrhea, syncope, abdominal pain, lower extremity edema or pain, or any other complaints.  Past Medical History:  Diagnosis Date  . Blood dyscrasia   . COPD (chronic obstructive pulmonary disease) ([place])   . Current smoker   . Difficult intubation    [facility]  . 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 Headache    "weekly" (09/04/2017)  . Heart murmur   . Lung cancer ([place]) dx'd 08/2017  . Pre-diabetes   . Rapid resting heart rate    happens infrequently    Patient Active Problem List   Diagnosis Date Noted  . Pleuritic chest pain 06/13/2018  . Rib pain on right side 12/21/2017  . Hypokalemia 10/15/2017  . S/P lobectomy of lung 08/20/2017  . Pulmonary nodule 07/16/2017  . Cervical adenopathy 07/04/2017  . Factor V  Leiden ([place]) 04/13/2015  . Prediabetes 04/13/2015  . History of colon polyps 04/13/2015  . H/O drug rash due to contrast  05/14/2014  . Loss of weight 04/02/2014  . Current smoker 04/02/2014  . Routine health maintenance 04/02/2014  . History of anemia 05/08/2007    Past Surgical History:  Procedure Laterality Date  . CESAREAN SECTION  1992; 2012  . COLONOSCOPY N/A 05/15/2014   Procedure: COLONOSCOPY;  Surgeon: Lafayette Dragon, MD;  [facility];  Service: Endoscopy;  Laterality: N/A;  . ESOPHAGOGASTRODUODENOSCOPY N/A 05/15/2014   Procedure: ESOPHAGOGASTRODUODENOSCOPY (EGD);  Surgeon: Lafayette Dragon, MD;  Location: [facility];  Service: Endoscopy;  Laterality: N/A;  . LAPAROSCOPY  07/07/2011   Procedure: LAPAROSCOPY DIAGNOSTIC;  Surgeon: Elveria Royals;  Location: [facility];  Service: Gynecology;  Laterality: N/A;  . LESION REMOVAL  07/07/2011   Procedure: EXCISION VAGINAL LESION;  Surgeon: Elveria Royals;  Location: [facility];  Service: Gynecology;  Laterality: N/A;  . LOBECTOMY Right 08/20/2017   Procedure: RIGHT UPPER LOBE LOBECTOMY;  Surgeon: [name], MD;  Location: [facility];  Service: Thoracic;  Laterality: Right;  . LYMPH NODE DISSECTION N/A 08/20/2017   Procedure: LYMPH NODE DISSECTION;  Surgeon: [name], MD;  Location: [facility];  Service: Thoracic;  Laterality: N/A;  . VAGINAL HYSTERECTOMY  ~ 2014  . VIDEO ASSISTED THORACOSCOPY (VATS)/WEDGE RESECTION Right  08/20/2017   Procedure: RIGHT VIDEO ASSISTED THORACOSCOPY (VATS)/WEDGE RESECTION;  Surgeon: [name], MD;  Location: [facility];  Service: Thoracic;  Laterality: Right;     OB History   None      Home Medications    Prior to Admission medications   Medication Sig Start Date End Date Taking? Authorizing Provider  acetaminophen (TYLENOL) 500 MG tablet Take 1,000 mg by mouth every 6 (six) hours as needed.   Yes [provider]  lidocaine (XYLOCAINE) 2 % solution Use as directed  15 mLs in the mouth or throat as needed for mouth pain. 03/22/18  Yes [place], [place], PA-C  naproxen sodium (ALEVE) 220 MG tablet Take 220 mg by mouth 2 (two) times daily as needed.   Yes [provider]    Family History History reviewed. No pertinent family history.  Social History Social History   Tobacco Use  . Smoking status: Former Smoker    Packs/day: 0.75    Years: 34.00    Pack years: 25.50    Types: Cigarettes    Last attempt to quit: 08/17/2017    Years since quitting: 0.8  . Smokeless tobacco: Never Used  . Tobacco comment: 1 cigarette per week currently  Substance Use Topics  . Alcohol use: Yes    Alcohol/week: 0.0 standard drinks    Comment: 09/04/2017 "a mixed drink a few times/year"  . Drug use: No     Allergies   Iohexol and Aspirin   Review of Systems Review of Systems  Constitutional: Negative for chills and fever.  Respiratory: Positive for cough and shortness of breath.   Cardiovascular: Positive for chest pain. Negative for leg swelling.  Gastrointestinal: Positive for nausea. Negative for abdominal pain, diarrhea and vomiting.  Neurological: Negative for dizziness, syncope, weakness and light-headedness.  All other systems reviewed and are negative.    Physical Exam Updated Vital Signs BP 128/81 (BP Location: Right Arm)   Pulse 79   Temp 98 F (36.7 C) (Oral)   Resp 18   Ht 5' 3.5" (1.613 m)   SpO2 99%   BMI 18.78 kg/m   Physical Exam  Constitutional: She appears well-developed and well-nourished. No distress.  HENT:  Head: Normocephalic and atraumatic.  Eyes: Conjunctivae are normal.  Neck: Neck supple.  Cardiovascular: Normal rate, regular rhythm, normal heart sounds and intact distal pulses.  Pulmonary/Chest: Effort normal and breath sounds normal. No respiratory distress.  No increased work of breathing.  Speaks in full sentences without noted difficulty.  Abdominal: Soft. There is no tenderness. There is no guarding.    Musculoskeletal: She exhibits no edema.  Lymphadenopathy:    She has no cervical adenopathy.  Neurological: She is alert.  Skin: Skin is warm and dry. She is not diaphoretic.  Psychiatric: She has a normal mood and affect. Her behavior is normal.  Nursing note and vitals reviewed.    ED Treatments / Results  Labs (all labs ordered are listed, but only abnormal results are displayed) Labs Reviewed  BASIC METABOLIC PANEL - Abnormal; Notable for the following components:      Result Value   Glucose, Bld 110 (*)    All other components within normal limits  CBC  I-STAT TROPONIN, ED    EKG None  Radiology Dg Chest 2 View  Result Date: 06/13/2018 CLINICAL DATA:  Chest pain. EXAM: CHEST - 2 VIEW COMPARISON:  Radiographs of Feb 05, 2018. FINDINGS: The heart size and mediastinal contours are within normal limits. Both  lungs are clear. No pneumothorax or pleural effusion is noted. The visualized skeletal structures are unremarkable. IMPRESSION: No active cardiopulmonary disease. Electronically Signed   By: Marijo Conception, M.D.   On: 06/13/2018 12:55    Procedures Procedures (including critical care time)  Medications Ordered in ED Medications  enoxaparin (LOVENOX) injection 75 mg (75 mg Subcutaneous Given 06/13/18 1933)     Initial Impression / Assessment and Plan / ED Course  I have reviewed the triage vital signs and the nursing notes.  Pertinent labs & imaging results that were available during my care of the patient were reviewed by me and considered in my medical decision making (see chart for details).  Clinical Course as of Jun 14 1999  Thu Jun 13, 2018  1827 Spoke with Dr. Rosana Hoes, radiologist.  States since patient has had a breakthrough reaction of rash, even with pre-medication, the safest option would be to perform a V/Q scan.    [SJ]  1900 Patient states she is a single mother and needs to get home to her son.   [SJ]  1907 Spoke with Col[place], pharmacist.  Recommends lovenox dosed at 1.5 mg/kg for 24-hour coverage.  States this medication is safe to give even with the patient's history of factor V Leiden   [SJ]  1951 Spoke with Dr. Frederico Hamman, IM resident.  I notified her that the patient left AMA.  I requested assistance from the internal medicine service to follow-up with the patient to assure that she comes back to the hospital for VQ scan.  Dr. Frederico Hamman states she will communicate with the clinic staff for this purpose.   [SJ]    Clinical Course User Index [SJ] Aiesha Leland C, PA-C    Patient presents with chest pain and shortness of breath.  Patient is nontoxic appearing, afebrile, not tachycardic, not tachypneic, not hypotensive, maintains excellent SPO2 on room air, and is in no apparent distress.  Lab results were reassuring.  Based on her vital signs and appearance, my suspicion for PE is low, however, due to the sudden onset of the patient's symptoms and her history of factor V Leiden, I suspect patient is too high risk to rely on the results of the d-dimer.  Therefore, VQ scan indicated.   Patient stated she needed to leave.  Prior to her leaving [facility], she was given a Lovenox injection.  She is to return in the morning for her VQ scan.  Findings and plan of care discussed with Duffy Bruce, MD.    Vitals:   06/13/18 1830 06/13/18 1900 06/13/18 1915 06/13/18 1932  BP: 119/89   (!) 121/96  Pulse: 71 79 85 72  Resp: 14 (!) 22 19 14   Temp:      TempSrc:      SpO2: 98% 100% 100% 99%  Height:         Final Clinical Impressions(s) / ED Diagnoses   Final diagnoses:  Atypical chest pain  Shortness of breath    ED Discharge Orders    None       Layla Maw 06/13/18 2221    Duffy Bruce, MD 06/15/18 [phone]

## 2018-06-13 NOTE — Progress Notes (Signed)
Returned paged from [facility] for admission for [name]. She was seen in clinic earlier today with complaints of progressive pleuritic L posterior chest pain, SOB, and productive cough concerning for PE due to her history of Factor V Leiden and R lung adenocarcinoma s/p resection 08/2017. Initial plan was to obtain STAT CTA as an outpatient, but she has an allergy to contrast requiring 13hr pre-medication protocol. She was instead sent to he ED for 4hr protocol.   In the ED, patient reported she has had a reaction to contrast even after being pre-medicated and did not want to undergo CTA. EDP consulted with radiologist who recommended V/Q scan. However, patient is a single mother and stated she had to leave to take care of her son and left AMA. She stated she will return to the ED tomorrow. She received a dose of Lovenox prior to leaving ED. Sent message to clinic staff to place call to patient tomorrow morning as a remainder to return to ED for V/Q scan given concerning symptoms.

## 2018-06-13 NOTE — ED Notes (Signed)
Pt called RN into room stating she would like to leave. RN told pt we are awaiting a consult. Shawn, PA notified and at bedside speaking with patient.

## 2018-06-13 NOTE — ED Notes (Signed)
Pt stating to RN she must leave to go see her family. RN explained to pt we are awaiting a consult and they have been re-paged. Pt stating she must leave immediately and cannot wait. RN to notify EDP.

## 2018-06-13 NOTE — Progress Notes (Signed)
   CC: chest pain  HPI:  [name] is a 47 y.o. with a PMH of Factor V Leiden, h/o adenocarcinoma of right lung [id number] s/p resection Nov 2018, COPD, presenting to clinic for chest pain.  Patient endorses a few week h/o of progressive left posterior chest with deep breath or cough, associated with shortness of breath, minimally productive cough, palpitations, and possible worsening of submandibular lymphadenopathy. She denies significant LE swelling, erythema, increased warmth. She denies weight loss, night sweats, fevers, chills, hemoptysis.  Please see problem based Assessment and Plan for status of patients chronic conditions.  Past Medical History:  Diagnosis Date  . Blood dyscrasia   . COPD (chronic obstructive pulmonary disease) ([place])   . Current smoker   . Difficult intubation    [facility]  . 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 Headache    "weekly" (09/04/2017)  . Heart murmur   . Lung cancer ([place]) dx'd 08/2017  . Pre-diabetes   . Rapid resting heart rate    happens infrequently    Review of Systems:   Per HPI  Physical Exam:  Vitals:   06/13/18 0903  BP: 118/68  Pulse: 91  Temp: 98.1 F (36.7 C)  TempSrc: Oral  SpO2: 100%  Weight: 107 lb 11.2 oz (48.9 kg)   GENERAL- alert, co-operative, appears as stated age, not in any distress. HEENT- rubbery, mildly tender submandibular lymphadenopathy. CARDIAC- RRR, no murmurs, rubs or gallops. RESP- Moving equal volumes of air, and clear to auscultation bilaterally, no wheezes or crackles. CHEST- mild TTP of left post ribs ABDOMEN- Soft, nontender, bowel sounds present. EXTREMITIES- pulse 2+, symmetric, no pedal edema. SKIN- Warm, dry, no rash or lesion. PSYCH- Normal mood and affect, appropriate thought content and speech.  Assessment & Plan:   See Encounters Tab for problem based charting.   Patient discussed with Dr. Verdie Drown, MD Internal Medicine PGY-3

## 2018-06-13 NOTE — Discharge Instructions (Signed)
Please return to the ED tomorrow morning to have a scan to rule out a blood clot in the lung. Return to the ED immediately should symptoms worsen.

## 2018-06-14 ENCOUNTER — Other Ambulatory Visit: Payer: Self-pay

## 2018-06-14 ENCOUNTER — Emergency Department (HOSPITAL_COMMUNITY)
Admission: EM | Admit: 2018-06-14 | Discharge: 2018-06-14 | Disposition: A | Discharge status: Home / Self Care | Payer: 59 | Attending: Emergency Medicine | Admitting: Emergency Medicine

## 2018-06-14 ENCOUNTER — Emergency Department (HOSPITAL_COMMUNITY): Payer: 59

## 2018-06-14 ENCOUNTER — Encounter (HOSPITAL_COMMUNITY): Payer: Self-pay | Admitting: *Deleted

## 2018-06-14 DIAGNOSIS — R7303 Prediabetes: Secondary | ICD-10-CM | POA: Diagnosis not present

## 2018-06-14 DIAGNOSIS — R0789 Other chest pain: Secondary | ICD-10-CM | POA: Diagnosis not present

## 2018-06-14 DIAGNOSIS — Z87891 Personal history of nicotine dependence: Secondary | ICD-10-CM | POA: Diagnosis not present

## 2018-06-14 DIAGNOSIS — Z85118 Personal history of other malignant neoplasm of bronchus and lung: Secondary | ICD-10-CM | POA: Diagnosis not present

## 2018-06-14 DIAGNOSIS — R091 Pleurisy: Secondary | ICD-10-CM | POA: Diagnosis not present

## 2018-06-14 DIAGNOSIS — J449 Chronic obstructive pulmonary disease, unspecified: Secondary | ICD-10-CM | POA: Diagnosis not present

## 2018-06-14 DIAGNOSIS — R0602 Shortness of breath: Secondary | ICD-10-CM | POA: Insufficient documentation

## 2018-06-14 MED ORDER — TECHNETIUM TC 99M DIETHYLENETRIAME-PENTAACETIC ACID
32.0000 | Freq: Once | INTRAVENOUS | Status: AC | PRN
  Administered 2018-06-14: 32 via RESPIRATORY_TRACT

## 2018-06-14 MED ORDER — IPRATROPIUM-ALBUTEROL 0.5-2.5 (3) MG/3ML IN SOLN
3.0000 mL | Freq: Once | RESPIRATORY_TRACT | Status: AC
  Administered 2018-06-14: 3 mL via RESPIRATORY_TRACT
  Filled 2018-06-14: qty 3

## 2018-06-14 MED ORDER — ALBUTEROL SULFATE HFA 108 (90 BASE) MCG/ACT IN AERS
2.0000 | INHALATION_SPRAY | Freq: Once | RESPIRATORY_TRACT | Status: AC
  Administered 2018-06-14: 2 via RESPIRATORY_TRACT
  Filled 2018-06-14: qty 6.7

## 2018-06-14 MED ORDER — TECHNETIUM TO 99M ALBUMIN AGGREGATED
4.1500 | Freq: Once | INTRAVENOUS | Status: AC | PRN
  Administered 2018-06-14: 4.15 via INTRAVENOUS

## 2018-06-14 NOTE — Discharge Instructions (Addendum)
1.  Schedule appointment with your family doctor to discuss further evaluation for shortness of breath, possibly including referral to pulmonology if needed. 2.  Try using albuterol inhaler 2 puffs every 4 hours as needed for shortness of breath. 3.  Return to the emergency department if you have new or worsening symptoms.

## 2018-06-14 NOTE — ED Triage Notes (Signed)
Pt returning to ED for VQ scan... Seen last night for chest pain and shortness of breath, plan was to admit but due to family restrictions pt had to leave and was instructed to return this am for VQ scan, alert and oriented no distress noted

## 2018-06-14 NOTE — ED Notes (Signed)
Pt transported to V/Q Scan

## 2018-06-14 NOTE — ED Notes (Signed)
Discharge instructions and prescription discussed with Pt. Pt verbalized understanding. Pt stable and ambulatory.    

## 2018-06-14 NOTE — ED Notes (Signed)
MD at bedside. 

## 2018-06-14 NOTE — ED Provider Notes (Signed)
[facility] EMERGENCY DEPARTMENT Provider Note   CSN: [id number] Arrival date & time: 06/14/18  0175     History   Chief Complaint Chief Complaint  Patient presents with  . Shortness of Breath    HPI Heather [name] is a 47 y.o. female.  HPI Patient is returning this morning for a VQ scan.  She denies any change in condition since her visit yesterday evening.  He has been having ongoing problems with shortness of breath particularly since her lobectomy in November.  This however seems to be worsening.  She reports that if she tries to clean house or talk on the phone for a long time she starts to get short of breath.  See Shawn Joy's note from yesterday. Past Medical History:  Diagnosis Date  . Blood dyscrasia   . COPD (chronic obstructive pulmonary disease) ([place])   . Current smoker   . Difficult intubation    pt was told difficult intubation at [facility]  . 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 Headache    "weekly" (09/04/2017)  . Heart murmur   . Lung cancer ([place]) dx'd 08/2017  . Pre-diabetes   . Rapid resting heart rate    happens infrequently    Patient Active Problem List   Diagnosis Date Noted  . Pleuritic chest pain 06/13/2018  . Rib pain on right side 12/21/2017  . Hypokalemia 10/15/2017  . S/P lobectomy of lung 08/20/2017  . Pulmonary nodule 07/16/2017  . Cervical adenopathy 07/04/2017  . Factor V Leiden ([place]) 04/13/2015  . Prediabetes 04/13/2015  . History of colon polyps 04/13/2015  . H/O drug rash due to contrast  05/14/2014  . Loss of weight 04/02/2014  . Current smoker 04/02/2014  . Routine health maintenance 04/02/2014  . History of anemia 05/08/2007    Past Surgical History:  Procedure Laterality Date  . CESAREAN SECTION  1992; 2012  . COLONOSCOPY N/A 05/15/2014   Procedure: COLONOSCOPY;  Surgeon: Lafayette Dragon, MD;  Location: [facility];  Service: Endoscopy;  Laterality: N/A;  .  ESOPHAGOGASTRODUODENOSCOPY N/A 05/15/2014   Procedure: ESOPHAGOGASTRODUODENOSCOPY (EGD);  Surgeon: Lafayette Dragon, MD;  Location: [facility];  Service: Endoscopy;  Laterality: N/A;  . LAPAROSCOPY  07/07/2011   Procedure: LAPAROSCOPY DIAGNOSTIC;  Surgeon: Elveria Royals;  Location: [facility];  Service: Gynecology;  Laterality: N/A;  . LESION REMOVAL  07/07/2011   Procedure: EXCISION VAGINAL LESION;  Surgeon: Elveria Royals;  Location: [facility];  Service: Gynecology;  Laterality: N/A;  . LOBECTOMY Right 08/20/2017   Procedure: RIGHT UPPER LOBE LOBECTOMY;  Surgeon: [name], MD;  Location: [facility];  Service: Thoracic;  Laterality: Right;  . LYMPH NODE DISSECTION N/A 08/20/2017   Procedure: LYMPH NODE DISSECTION;  Surgeon: [name], MD;  Location: [facility];  Service: Thoracic;  Laterality: N/A;  . VAGINAL HYSTERECTOMY  ~ 2014  . VIDEO ASSISTED THORACOSCOPY (VATS)/WEDGE RESECTION Right 08/20/2017   Procedure: RIGHT VIDEO ASSISTED THORACOSCOPY (VATS)/WEDGE RESECTION;  Surgeon: [name], MD;  Location: [facility];  Service: Thoracic;  Laterality: Right;     OB History   None      Home Medications    Prior to Admission medications   Medication Sig Start Date End Date Taking? Authorizing Provider  lidocaine (XYLOCAINE) 2 % solution Use as directed 15 mLs in the mouth or throat as needed for mouth pain. Patient not taking: Reported on 06/14/2018 03/22/18   Stacey Drain [place],  PA-C    Family History History reviewed. No pertinent family history.  Social History Social History   Tobacco Use  . Smoking status: Former Smoker    Packs/day: 0.75    Years: 34.00    Pack years: 25.50    Types: Cigarettes    Last attempt to quit: 08/17/2017    Years since quitting: 0.8  . Smokeless tobacco: Never Used  . Tobacco comment: 1 cigarette per week currently  Substance Use Topics  . Alcohol use: Yes    Alcohol/week: 0.0 standard drinks    Comment: 09/04/2017 "a mixed drink a  few times/year"  . Drug use: No     Allergies   Iohexol and Aspirin   Review of Systems Review of Systems 10 Systems reviewed and are negative for acute change except as noted in the HPI.   Physical Exam Updated Vital Signs BP 110/83   Pulse 73   Temp 98 F (36.7 C) (Oral)   Resp 17   SpO2 99%   Physical Exam  Constitutional: She is oriented to person, place, and time.  Patient is alert and nontoxic.  She is very thin.  No active respiratory distress at rest.  Alert and appropriate.  HENT:  Head: Normocephalic and atraumatic.  Eyes: EOM are normal.  Cardiovascular: Normal rate, regular rhythm, normal heart sounds and intact distal pulses.  Pulmonary/Chest: Effort normal and breath sounds normal. No respiratory distress.  Abdominal: Soft. She exhibits no distension. There is no tenderness. There is no guarding.  Musculoskeletal: Normal range of motion. She exhibits no edema or tenderness.  Neurological: She is alert and oriented to person, place, and time. She exhibits normal muscle tone. Coordination normal.  Skin: Skin is warm and dry.  Psychiatric: She has a normal mood and affect.     ED Treatments / Results  Labs (all labs ordered are listed, but only abnormal results are displayed) Labs Reviewed - No data to display  EKG None  Radiology Dg Chest 2 View  Result Date: 06/13/2018 CLINICAL DATA:  Chest pain. EXAM: CHEST - 2 VIEW COMPARISON:  Radiographs of Feb 05, 2018. FINDINGS: The heart size and mediastinal contours are within normal limits. Both lungs are clear. No pneumothorax or pleural effusion is noted. The visualized skeletal structures are unremarkable. IMPRESSION: No active cardiopulmonary disease. Electronically Signed   By: Marijo Conception, M.D.   On: 06/13/2018 12:55   Nm Pulmonary Vent And Perf (v/q Scan)  Result Date: 06/14/2018 CLINICAL DATA:  pleuritic L posterior chest pain, SOB, and productive cough concerning for PE due to her history of  Factor V Leiden and R lung adenocarcinoma s/p resection 08/2017. EXAM: NUCLEAR MEDICINE VENTILATION - PERFUSION LUNG SCAN TECHNIQUE: Ventilation images were obtained in multiple projections using inhaled aerosol Tc-52m DTPA. Perfusion images were obtained in multiple projections after intravenous injection of Tc-40m-MAA. RADIOPHARMACEUTICALS:  4.15 mCi of Tc-55m DTPA aerosol inhalation and 32 mCi Tc55m-MAA IV COMPARISON:  Radiograph from previous day, showing no active disease, pulmonary hyperinflation, postop changes in the right upper lung FINDINGS: Ventilation: Decreased activity at the right apex. No other significant focal ventilation defect. Perfusion: Concordant decrease in apical activity. No wedge shaped peripheral perfusion defects to suggest acute pulmonary embolism. IMPRESSION: Low likelihood ratio for pulmonary embolism. Electronically Signed   By: Lucrezia Europe M.D.   On: 06/14/2018 12:16    Procedures Procedures (including critical care time)  Medications Ordered in ED Medications  ipratropium-albuterol (DUONEB) 0.5-2.5 (3) MG/3ML nebulizer solution 3 mL (  has no administration in time range)  albuterol (PROVENTIL HFA;VENTOLIN HFA) 108 (90 Base) MCG/ACT inhaler 2 puff (has no administration in time range)  technetium TC 30M diethylenetriame-pentaacetic acid (DTPA) injection 32 millicurie (32 millicuries Inhalation Given 06/14/18 1158)  technetium albumin aggregated (MAA) injection solution 5.79 millicurie (0.38 millicuries Intravenous Contrast Given 06/14/18 1157)     Initial Impression / Assessment and Plan / ED Course  I have reviewed the triage vital signs and the nursing notes.  Pertinent labs & imaging results that were available during my care of the patient were reviewed by me and considered in my medical decision making (see chart for details).     Final Clinical Impressions(s) / ED Diagnoses   Final diagnoses:  Shortness of breath  Pleurisy   VQ scan low probability.   Patient is clinically well.  She has had ongoing dyspnea of uncertain etiology.  Patient does have history of right upper lobectomy.  She also has been a smoker although has nearly quit at this point.  There may be some element of chronic bronchitis although cough does not seem to be a predominant feature.  She does not have peripheral edema or other signs of volume overload.  Recommendation to patient is to try as needed albuterol inhaler which will be dispensed with the emergency department and follow-up with her PCP for further pulmonary testing and possible referral to pulmonology if needed.  Return precautions reviewed. ED Discharge Orders    None       Charlesetta Shanks, MD 06/14/18 1348

## 2018-07-01 ENCOUNTER — Other Ambulatory Visit: Payer: Self-pay | Admitting: *Deleted

## 2018-08-14 ENCOUNTER — Other Ambulatory Visit: Payer: Self-pay | Admitting: Medical Oncology

## 2018-08-14 DIAGNOSIS — C3490 Malignant neoplasm of unspecified part of unspecified bronchus or lung: Secondary | ICD-10-CM

## 2018-08-20 ENCOUNTER — Ambulatory Visit: Payer: 59 | Admitting: Thoracic Surgery (Cardiothoracic Vascular Surgery)

## 2018-08-23 ENCOUNTER — Ambulatory Visit
Admission: RE | Admit: 2018-08-23 | Discharge: 2018-08-23 | Disposition: A | Discharge status: Home / Self Care | Payer: 59 | Source: Ambulatory Visit | Attending: Internal Medicine | Admitting: Internal Medicine

## 2018-08-23 ENCOUNTER — Other Ambulatory Visit: Payer: Self-pay | Admitting: Internal Medicine

## 2018-08-23 DIAGNOSIS — Z1231 Encounter for screening mammogram for malignant neoplasm of breast: Secondary | ICD-10-CM

## 2018-08-26 ENCOUNTER — Other Ambulatory Visit: Payer: Self-pay | Admitting: Thoracic Surgery (Cardiothoracic Vascular Surgery)

## 2018-08-26 DIAGNOSIS — Z902 Acquired absence of lung [part of]: Secondary | ICD-10-CM

## 2018-08-27 ENCOUNTER — Ambulatory Visit
Admission: RE | Admit: 2018-08-27 | Discharge: 2018-08-27 | Disposition: A | Discharge status: Home / Self Care | Payer: 59 | Source: Ambulatory Visit | Attending: Thoracic Surgery (Cardiothoracic Vascular Surgery) | Admitting: Thoracic Surgery (Cardiothoracic Vascular Surgery)

## 2018-08-27 ENCOUNTER — Other Ambulatory Visit: Payer: Self-pay

## 2018-08-27 ENCOUNTER — Ambulatory Visit (INDEPENDENT_AMBULATORY_CARE_PROVIDER_SITE_OTHER): Payer: 59 | Admitting: Thoracic Surgery (Cardiothoracic Vascular Surgery)

## 2018-08-27 ENCOUNTER — Encounter: Payer: Self-pay | Admitting: Thoracic Surgery (Cardiothoracic Vascular Surgery)

## 2018-08-27 VITALS — BP 128/88 | HR 83 | Resp 18 | Ht 63.0 in | Wt 103.4 lb

## 2018-08-27 DIAGNOSIS — Z902 Acquired absence of lung [part of]: Secondary | ICD-10-CM

## 2018-08-27 DIAGNOSIS — J449 Chronic obstructive pulmonary disease, unspecified: Secondary | ICD-10-CM | POA: Diagnosis not present

## 2018-08-27 DIAGNOSIS — R0781 Pleurodynia: Secondary | ICD-10-CM | POA: Diagnosis not present

## 2018-08-27 NOTE — Progress Notes (Signed)
[address] HeightsSuite [address]       [place] [address]             [phone]       HPI: Heather [name] returns for a scheduled follow-up visit  Heather [name] is a 47 year old woman with a past history of tobacco abuse, COPD, and factor V deficiency.  She had a thoracoscopic right upper lobectomy for stage IA non-small cell carcinoma in November 2018.  She did have a prolonged air leak postoperatively but that eventually resolved.  I last saw her in the office in May.  At that time she was still having a lot of pain.  She had a persistent cough.  In September she was having a lot of difficulty breathing and ended up in the emergency room.  She had a VQ scan that was low probability for PE.  She says that her pain is resolved.  She does not have any pain related to her surgery.  She still has a cough which gets worse the more she talks during the day.  Her breathing has improved and she thinks that was likely due to the humidity.  Past Medical History:  Diagnosis Date  . Blood dyscrasia   . COPD (chronic obstructive pulmonary disease) ([place])   . Current smoker   . Difficult intubation    pt was told difficult intubation [facility]  . 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 Headache    "weekly" (09/04/2017)  . Heart murmur   . Lung cancer ([place]) dx'd 08/2017  . Pre-diabetes   . Rapid resting heart rate    happens infrequently    No current outpatient medications on file.   No current facility-administered medications for this visit.     Physical Exam BP 128/88 (BP Location: Right Arm, Patient Position: Sitting, Cuff Size: Normal)   Pulse 83   Resp 18   Ht 5\' 3"  (1.6 m)   Wt 103 lb 6.4 oz (46.9 kg)   SpO2 98% Comment: RA  BMI 18.50 kg/m  47 year old woman in no acute distress Alert and oriented x3 with no focal deficits Lungs slightly diminished at right base, otherwise clear Cardiac regular rate and rhythm normal S1 and S2 Incisions well-healed No  cervical or subclavicular adenopathy  Diagnostic Tests: CHEST - 2 VIEW  COMPARISON:  PA and lateral chest x-ray of June 13, 2018  FINDINGS: The lungs remain hyperinflated. Postsurgical changes are present and stable on the right. No new masses are observed. The heart and pulmonary vascularity are normal. There is calcification in the wall of the aortic arch. The bony thorax exhibits no acute abnormality.  IMPRESSION: COPD. Postsurgical changes in the right upper lobe and right hilum without findings to suggest recurrent malignancy. No evidence of pneumonia.   Electronically Signed   By: David  Martinique M.D.   On: 08/27/2018 14:11 I personally reviewed the chest x-ray images and concur with the findings noted above  Impression: Heather [name] is a 47 year old woman with history of tobacco abuse, COPD, factor V Leiden deficiency, and a stage IA adenocarcinoma treated with right upper lobectomy a year ago.  Stage Ia adenocarcinoma-she is being followed Dr. Julien Nordmann.  She is scheduled for a CT next month.  She has no evidence of recurrent disease.  COPD-symptoms currently well controlled.  She actually had good flows on her preoperative PFTs.  Cough-persistent since surgery.  It remains problematic for her, but she  has adjusted to it to some degree.  Postoperative pain-resolved  Plan: Follow-up with Dr. Julien Nordmann  I will be happy to see her back anytime in the future if I can be of any further assistance with her care  [name], MD [facility] [phone]

## 2018-09-06 ENCOUNTER — Telehealth: Payer: Self-pay | Admitting: Internal Medicine

## 2018-09-06 NOTE — Telephone Encounter (Signed)
Moved 12/19 f/u to 11:30 am. Spoke with patient she is aware.

## 2018-09-17 ENCOUNTER — Ambulatory Visit (HOSPITAL_COMMUNITY): Payer: 59

## 2018-09-17 ENCOUNTER — Inpatient Hospital Stay: Payer: 59 | Attending: Internal Medicine

## 2018-09-17 ENCOUNTER — Telehealth: Payer: Self-pay | Admitting: Medical Oncology

## 2018-09-17 DIAGNOSIS — Z85118 Personal history of other malignant neoplasm of bronchus and lung: Secondary | ICD-10-CM | POA: Insufficient documentation

## 2018-09-17 DIAGNOSIS — Z902 Acquired absence of lung [part of]: Secondary | ICD-10-CM | POA: Insufficient documentation

## 2018-09-17 DIAGNOSIS — R05 Cough: Secondary | ICD-10-CM | POA: Insufficient documentation

## 2018-09-17 NOTE — Telephone Encounter (Signed)
Pt will r/s scan .

## 2018-09-18 ENCOUNTER — Telehealth: Payer: Self-pay | Admitting: Internal Medicine

## 2018-09-18 NOTE — Telephone Encounter (Signed)
Scheduled appt per 12/17 sch message - pt is aware of appt date and time

## 2018-09-19 ENCOUNTER — Inpatient Hospital Stay: Payer: 59 | Admitting: Internal Medicine

## 2018-09-23 ENCOUNTER — Inpatient Hospital Stay: Payer: 59

## 2018-09-23 ENCOUNTER — Ambulatory Visit (HOSPITAL_COMMUNITY)
Admission: RE | Admit: 2018-09-23 | Discharge: 2018-09-23 | Disposition: A | Discharge status: Home / Self Care | Payer: 59 | Source: Ambulatory Visit | Attending: Internal Medicine | Admitting: Internal Medicine

## 2018-09-23 DIAGNOSIS — Z85118 Personal history of other malignant neoplasm of bronchus and lung: Secondary | ICD-10-CM | POA: Diagnosis not present

## 2018-09-23 DIAGNOSIS — Z902 Acquired absence of lung [part of]: Secondary | ICD-10-CM | POA: Diagnosis not present

## 2018-09-23 DIAGNOSIS — R0602 Shortness of breath: Secondary | ICD-10-CM | POA: Diagnosis not present

## 2018-09-23 DIAGNOSIS — C3490 Malignant neoplasm of unspecified part of unspecified bronchus or lung: Secondary | ICD-10-CM | POA: Diagnosis not present

## 2018-09-23 DIAGNOSIS — R05 Cough: Secondary | ICD-10-CM | POA: Diagnosis not present

## 2018-09-23 LAB — CBC WITH DIFFERENTIAL (CANCER CENTER ONLY)
ABS IMMATURE GRANULOCYTES: 0.03 10*3/uL (ref 0.00–0.07)
BASOS ABS: 0.1 10*3/uL (ref 0.0–0.1)
Basophils Relative: 1 %
Eosinophils Absolute: 0 10*3/uL (ref 0.0–0.5)
Eosinophils Relative: 0 %
HEMATOCRIT: 41.6 % (ref 36.0–46.0)
HEMOGLOBIN: 13.5 g/dL (ref 12.0–15.0)
IMMATURE GRANULOCYTES: 0 %
LYMPHS ABS: 1 10*3/uL (ref 0.7–4.0)
LYMPHS PCT: 14 %
MCH: 30.5 pg (ref 26.0–34.0)
MCHC: 32.5 g/dL (ref 30.0–36.0)
MCV: 93.9 fL (ref 80.0–100.0)
MONOS PCT: 5 %
Monocytes Absolute: 0.3 10*3/uL (ref 0.1–1.0)
NEUTROS ABS: 5.7 10*3/uL (ref 1.7–7.7)
NEUTROS PCT: 80 %
Platelet Count: 134 10*3/uL — ABNORMAL LOW (ref 150–400)
RBC: 4.43 MIL/uL (ref 3.87–5.11)
RDW: 13.5 % (ref 11.5–15.5)
WBC Count: 7.1 10*3/uL (ref 4.0–10.5)
nRBC: 0 % (ref 0.0–0.2)

## 2018-09-23 LAB — CMP (CANCER CENTER ONLY)
ALBUMIN: 4.5 g/dL (ref 3.5–5.0)
ALT: 12 U/L (ref 0–44)
ANION GAP: 10 (ref 5–15)
AST: 14 U/L — ABNORMAL LOW (ref 15–41)
Alkaline Phosphatase: 58 U/L (ref 38–126)
BUN: 9 mg/dL (ref 6–20)
CALCIUM: 9.7 mg/dL (ref 8.9–10.3)
CHLORIDE: 108 mmol/L (ref 98–111)
CO2: 24 mmol/L (ref 22–32)
Creatinine: 0.83 mg/dL (ref 0.44–1.00)
GFR, Est AFR Am: >60 mL/min (ref >60)
GFR, Estimated: >60 mL/min (ref >60)
GLUCOSE: 114 mg/dL — AB (ref 70–99)
Potassium: 4.1 mmol/L (ref 3.5–5.1)
SODIUM: 142 mmol/L (ref 135–145)
Total Bilirubin: 0.4 mg/dL (ref 0.3–1.2)
Total Protein: 7.4 g/dL (ref 6.5–8.1)

## 2018-09-30 ENCOUNTER — Inpatient Hospital Stay (HOSPITAL_BASED_OUTPATIENT_CLINIC_OR_DEPARTMENT_OTHER): Payer: 59 | Admitting: Internal Medicine

## 2018-09-30 ENCOUNTER — Encounter: Payer: Self-pay | Admitting: Internal Medicine

## 2018-09-30 DIAGNOSIS — C3490 Malignant neoplasm of unspecified part of unspecified bronchus or lung: Secondary | ICD-10-CM

## 2018-09-30 DIAGNOSIS — Z902 Acquired absence of lung [part of]: Secondary | ICD-10-CM | POA: Diagnosis not present

## 2018-09-30 DIAGNOSIS — Z85118 Personal history of other malignant neoplasm of bronchus and lung: Secondary | ICD-10-CM

## 2018-09-30 DIAGNOSIS — R05 Cough: Secondary | ICD-10-CM

## 2018-09-30 NOTE — Progress Notes (Signed)
[facility] [phone]   Fax:([phone]) [phone]  OFFICE PROGRESS NOTE  Aldine Contes, MD [address], [address] [place] [address]  DIAGNOSIS: Stage IA (T1a, N0, M0) moderately differentiated adenocarcinoma diagnosed in November 2018.  PRIOR THERAPY: Status post right upper lobectomy with mediastinal lymph node dissection under the care of Dr. Roxan Hockey on August 20, 2017.  CURRENT THERAPY: Observation.  INTERVAL HISTORY: Heather [name] 47 y.o. female returns to the clinic today for 6 months follow-up visit.  The patient is feeling fine today with no concerning complaints except for dry cough.  She denied having any chest pain, shortness of breath or hemoptysis.  She denied having any fever or chills.  She has no nausea, vomiting, diarrhea or constipation.  She had repeat CT scan of the chest performed recently and she is here for evaluation and discussion of her risk her results.  MEDICAL HISTORY: Past Medical History:  Diagnosis Date  . Blood dyscrasia   . COPD (chronic obstructive pulmonary disease) ([place])   . Current smoker   . Difficult intubation    pt was told [facility] int[facility]  . 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 Headache    "weekly" (09/04/2017)  . Heart murmur   . Lung cancer ([place]) dx'd 08/2017  . Pre-diabetes   . Rapid resting heart rate    happens infrequently    ALLERGIES:  is allergic to iohexol and aspirin.  MEDICATIONS:  No current outpatient medications on file.   No current facility-administered medications for this visit.     SURGICAL HISTORY:  Past Surgical History:  Procedure Laterality Date  . CESAREAN SECTION  1992; 2012  . COLONOSCOPY N/A 05/15/2014   Procedure: COLONOSCOPY;  Surgeon: Lafayette Dragon, MD;  [facility]: [facility];  Service: Endoscopy;  Laterality: N/A;  . ESOPHAGOGASTRODUODENOSCOPY N/A 05/15/2014   Procedure: ESOPHAGOGASTRODUODENOSCOPY (EGD);   Surgeon: Lafayette Dragon, MD;  Location: [facility];  Service: Endoscopy;  Laterality: N/A;  . LAPAROSCOPY  07/07/2011   Procedure: LAPAROSCOPY DIAGNOSTIC;  Surgeon: Elveria Royals;  Location: [facility];  Service: Gynecology;  Laterality: N/A;  . LESION REMOVAL  07/07/2011   Procedure: EXCISION VAGINAL LESION;  Surgeon: Elveria Royals;  Location: [facility];  Service: Gynecology;  Laterality: N/A;  . LOBECTOMY Right 08/20/2017   Procedure: RIGHT UPPER LOBE LOBECTOMY;  Surgeon: [name], MD;  Location: [facility];  Service: Thoracic;  Laterality: Right;  . LYMPH NODE DISSECTION N/A 08/20/2017   Procedure: LYMPH NODE DISSECTION;  Surgeon: [name], MD;  Location: [facility];  Service: Thoracic;  Laterality: N/A;  . VAGINAL HYSTERECTOMY  ~ 2014  . VIDEO ASSISTED THORACOSCOPY (VATS)/WEDGE RESECTION Right 08/20/2017   Procedure: RIGHT VIDEO ASSISTED THORACOSCOPY (VATS)/WEDGE RESECTION;  Surgeon: [name], MD;  Location: [facility];  Service: Thoracic;  Laterality: Right;    REVIEW OF SYSTEMS:  A comprehensive review of systems was negative except for: Respiratory: positive for cough   PHYSICAL EXAMINATION: General appearance: alert, cooperative and no distress Head: Normocephalic, without obvious abnormality, atraumatic Neck: no adenopathy, no JVD, supple, symmetrical, trachea midline and thyroid not enlarged, symmetric, no tenderness/mass/nodules Lymph nodes: Cervical, supraclavicular, and axillary nodes normal. Resp: clear to auscultation bilaterally Back: symmetric, no curvature. ROM normal. No CVA tenderness. Cardio: regular rate and rhythm, S1, S2 normal, no murmur, click, rub or gallop GI: soft, non-tender; bowel sounds normal; no masses,  no organomegaly  Extremities: extremities normal, atraumatic, no cyanosis or edema  ECOG PERFORMANCE STATUS: 1 - Symptomatic but completely ambulatory  Blood pressure 124/79, pulse 86, temperature 98.7 F (37.1 C), temperature source  Oral, resp. rate 17, height 5\' 3"  (1.6 m), weight 101 lb 12.8 oz (46.2 kg), SpO2 100 %.  LABORATORY DATA: Lab Results  Component Value Date   WBC 7.1 09/23/2018   HGB 13.5 09/23/2018   HCT 41.6 09/23/2018   MCV 93.9 09/23/2018   PLT 134 (L) 09/23/2018      Chemistry      Component Value Date/Time   NA 142 09/23/2018 0811   NA 141 07/31/2017 1054   K 4.1 09/23/2018 0811   K 3.6 07/31/2017 1054   CL 108 09/23/2018 0811   CO2 24 09/23/2018 0811   CO2 23 07/31/2017 1054   BUN 9 09/23/2018 0811   BUN 5.9 (L) 07/31/2017 1054   CREATININE 0.83 09/23/2018 0811   CREATININE 0.8 07/31/2017 1054      Component Value Date/Time   CALCIUM 9.7 09/23/2018 0811   CALCIUM 9.9 07/31/2017 1054   ALKPHOS 58 09/23/2018 0811   ALKPHOS 50 07/31/2017 1054   AST 14 (L) 09/23/2018 0811   AST 12 07/31/2017 1054   ALT 12 09/23/2018 0811   ALT 9 07/31/2017 1054   BILITOT 0.4 09/23/2018 0811   BILITOT 0.69 07/31/2017 1054       RADIOGRAPHIC STUDIES: Ct Chest Wo Contrast  Result Date: 09/23/2018 CLINICAL DATA:  Lung cancer, right upper lobectomy, persistent shortness of breath and cough. EXAM: CT CHEST WITHOUT CONTRAST TECHNIQUE: Multidetector CT imaging of the chest was performed following the standard protocol without IV contrast. COMPARISON:  02/08/2018. FINDINGS: Cardiovascular: Atherosclerotic calcification of the aorta and coronary arteries. Heart size normal. No pericardial effusion. Mediastinum/Nodes: No pathologically enlarged mediastinal or axillary lymph nodes. Hilar regions are difficult to evaluate without IV contrast. Esophagus is grossly unremarkable. Lungs/Pleura: Right upper lobectomy. Right middle lobe may be surgically absent as well. Left apical pleuroparenchymal scarring. Centrilobular and paraseptal emphysema. No pleural fluid. Airway is otherwise unremarkable. Upper Abdomen: Visualized portions of the liver, adrenal glands and kidneys are unremarkable. Calcification is seen along  the periphery of the spleen. Visualized portions of the pancreas and stomach are grossly unremarkable. Musculoskeletal: No worrisome lytic or sclerotic lesions. IMPRESSION: 1. Postoperative changes in the right hemithorax without evidence of recurrent or metastatic disease. 2. Aortic atherosclerosis (ICD10-170.0). Coronary artery calcification. 3.  Emphysema (ICD10-J43.9). Electronically Signed   By: Lorin Picket M.D.   On: 09/23/2018 10:52    ASSESSMENT AND PLAN: This is a very pleasant 47 years old white female recently diagnosed with a stage IA non-small cell lung cancer, adenocarcinoma status post right upper lobectomy with lymph node dissection in November 2018 under the care of Dr. Roxan Hockey. The patient is currently on observation and she is feeling fine with no concerning complaints. She had repeat CT scan of the chest performed recently.  I personally and independently reviewed the scans and discussed the results with the patient today. Her scan showed no concerning findings for disease recurrence.  I recommended for the patient to continue on observation with repeat CT scan of the chest in 6 months. She was advised to call immediately if she has any concerning symptoms in the interval. The patient voices understanding of current disease status and treatment options and is in agreement with the current care plan. All questions were answered. The patient knows to call the clinic with any problems,  questions or concerns. We can certainly see the patient much sooner if necessary.  I spent 10 minutes counseling the patient face to face. The total time spent in the appointment was 15 minutes.  Disclaimer: This note was dictated with voice recognition software. Similar sounding words can inadvertently be transcribed and may not be corrected upon review.

## 2018-10-01 ENCOUNTER — Telehealth: Payer: Self-pay | Admitting: Internal Medicine

## 2018-10-01 NOTE — Telephone Encounter (Signed)
Scheduled appt per 12/30 los - sent reminder letter in the mail with appt date and time

## 2018-11-29 ENCOUNTER — Encounter: Payer: Self-pay | Admitting: Gastroenterology

## 2019-01-28 ENCOUNTER — Telehealth: Payer: 59 | Admitting: Physician Assistant

## 2019-01-28 DIAGNOSIS — B9689 Other specified bacterial agents as the cause of diseases classified elsewhere: Secondary | ICD-10-CM

## 2019-01-28 DIAGNOSIS — J208 Acute bronchitis due to other specified organisms: Secondary | ICD-10-CM

## 2019-01-28 MED ORDER — DOXYCYCLINE HYCLATE 100 MG PO CAPS: 100.0000 mg | ORAL_CAPSULE | Freq: Two times a day (BID) | ORAL | 0 refills | Status: AC

## 2019-01-28 MED ORDER — BENZONATATE 100 MG PO CAPS: 100.0000 mg | ORAL_CAPSULE | Freq: Three times a day (TID) | ORAL | 0 refills | Status: DC

## 2019-01-28 MED FILL — BENZONATATE 100 MG CAPS: 100 | 5 days supply | Qty: 30 | Fill #0

## 2019-01-28 MED FILL — DOXYCYCLINE HYCLATE 100 MG: 100 | 7 days supply | Qty: 14 | Fill #0

## 2019-01-28 NOTE — Progress Notes (Signed)
We are sorry that you are not feeling well.  Here is how we plan to help!  Based on your presentation I believe you most likely have A cough due to bacteria.  When patients have a fever and a productive cough with a change in color or increased sputum production, we are concerned about bacterial bronchitis.  If left untreated it can progress to pneumonia.  If your symptoms do not improve with your treatment plan it is important that you contact your provider.   I have prescribed Doxycycline 100 mg twice a day for 7 days     In addition you may use A prescription cough medication called Tessalon Perles 100mg . You may take 1-2 capsules every 8 hours as needed for your cough.    From your responses in the eVisit questionnaire you describe inflammation in the upper respiratory tract which is causing a significant cough.  This is commonly called Bronchitis and has four common causes:   Allergies Viral Infections Acid Reflux Bacterial Infection Allergies, viruses and acid reflux are treated by controlling symptoms or eliminating the cause. An example might be a cough caused by taking certain blood pressure medications. You stop the cough by changing the medication. Another example might be a cough caused by acid reflux. Controlling the reflux helps control the cough.  USE OF BRONCHODILATOR ("RESCUE") INHALERS: There is a risk from using your bronchodilator too frequently.  The risk is that over-reliance on a medication which only relaxes the muscles surrounding the breathing tubes can reduce the effectiveness of medications prescribed to reduce swelling and congestion of the tubes themselves.  Although you feel brief relief from the bronchodilator inhaler, your asthma may actually be worsening with the tubes becoming more swollen and filled with mucus.  This can delay other crucial treatments, such as oral steroid medications. If you need to use a bronchodilator inhaler daily, several times per day, you  should discuss this with your provider.  There are probably better treatments that could be used to keep your asthma under control.     HOME CARE Only take medications as instructed by your medical team. Complete the entire course of an antibiotic. Drink plenty of fluids and get plenty of rest. Avoid close contacts especially the very young and the elderly Cover your mouth if you cough or cough into your sleeve. Always remember to wash your hands A steam or ultrasonic humidifier can help congestion.   GET HELP RIGHT AWAY IF: You develop worsening fever. You become short of breath You cough up blood. Your symptoms persist after you have completed your treatment plan MAKE SURE YOU  Understand these instructions. Will watch your condition. Will get help right away if you are not doing well or get worse.  Your e-visit answers were reviewed by a board certified advanced clinical practitioner to complete your personal care plan.  Depending on the condition, your plan could have included both over the counter or prescription medications. If there is a problem please reply once you have received a response from your provider. Your safety is important to [place].  If you have drug allergies check your prescription carefully.    You can use MyChart to ask questions about today's visit, request a non-urgent call back, or ask for a work or school excuse for 24 hours related to this e-Visit. If it has been greater than 24 hours you will need to follow up with your provider, or enter a new e-Visit to address those concerns. You  will get an e-mail in the next two days asking about your experience.  I hope that your e-visit has been valuable and will speed your recovery. Thank you for using e-visits.   ===View-only below this line===   ----- Message -----    From: [name]    Sent: 01/28/2019 11:43 AM EDT      To: E-Visit Mailing List Subject: E-Visit Submission: Cough  E-Visit Submission:  Cough --------------------------------  Question: How long have you been coughing? Answer:   7 or more days  Question: How would you describe the cough? Answer:   A deep cough  Question: How often are you coughing? Answer:   Constantly  Question: Does the cough prevent you from sleeping at night? Answer:   Yes  Question: What other symptoms have you experienced with the cough? Answer:   Chest pain            Headache  Question: Do you have a fever? Answer:   No, I do not have a fever  Question: Are you coughing up any mucus? Answer:   I am coughing up a little bit of mucus  Question: Do you use a maintenance inhaler? Answer:   No  Question: Do you use a rescue inhaler (such as Ventolin?) Answer:   No  Question: Have you previously required a prescription for prednisone for cough? Answer:   No  Question: Are you diabetic? Answer:   No  Question: Are you pregnant? Answer:   I am confident that I am not pregnant  Question: Are you breastfeeding? Answer:   No  Question: What is the appearance of the mucus? Answer:   The mucus has changed from clear to colored  Question: Do you have any of the following? Answer:   Loss of appetite            Fatigue  Question: Do you smoke? Answer:   Sometimes  Question: Are there people you know with similar symptoms? Answer:   No  Question: Are you experiencing any of the following? Answer:   Coughing more after exercise  Question: Are you having difficulty breathing? Answer:   Yes  Question: Please describe what kind of difficulty you are having breathing. Answer:   When I take in a breath especially deep, I will start coughing more but even just walking a little distance I have to stop a few minutes and catch my breath. Its like my chest feels like it is so tight or pressure is on it but it radiates from my chest to my mid back and side on the left. I have 14 steps to go upstairs in my house and even trying to get to the  top of them im out of breath. 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 I have had no fever at all though  Question: Is your coughing worse when you are exposed to pollen, dust, or other things in the environment? Answer:   No  Question: Have you been treated for a similar cough in the past? Answer:   No  Question: Have you ever been diagnosed with asthma, bronchitis, or lung disease? Answer:   No  Question: Have you recently started  on any medications for your heart or for blood pressure? Answer:   No  Question: Have you recently been hospitalized? Answer:   No  Question: Please list your medication allergies that you may have ? (If 'none' , please list as 'none') Answer:   Contrast dye...extremely!  aspirin products make me nausea  Question: Please list any additional comments  Answer:     A total of 5-10 minutes was spent evaluating this patients questionnaire and formulating a plan of care.

## 2019-02-19 IMAGING — DX DG CHEST 1V PORT
1 series · 1 of 1 positions shown · non-contrast
Comparison: 1 day prior

CLINICAL DATA: Shortness of breath. Follow-up of lobectomy. Chest
tube.

EXAM:
PORTABLE CHEST 1 VIEW

[chest]
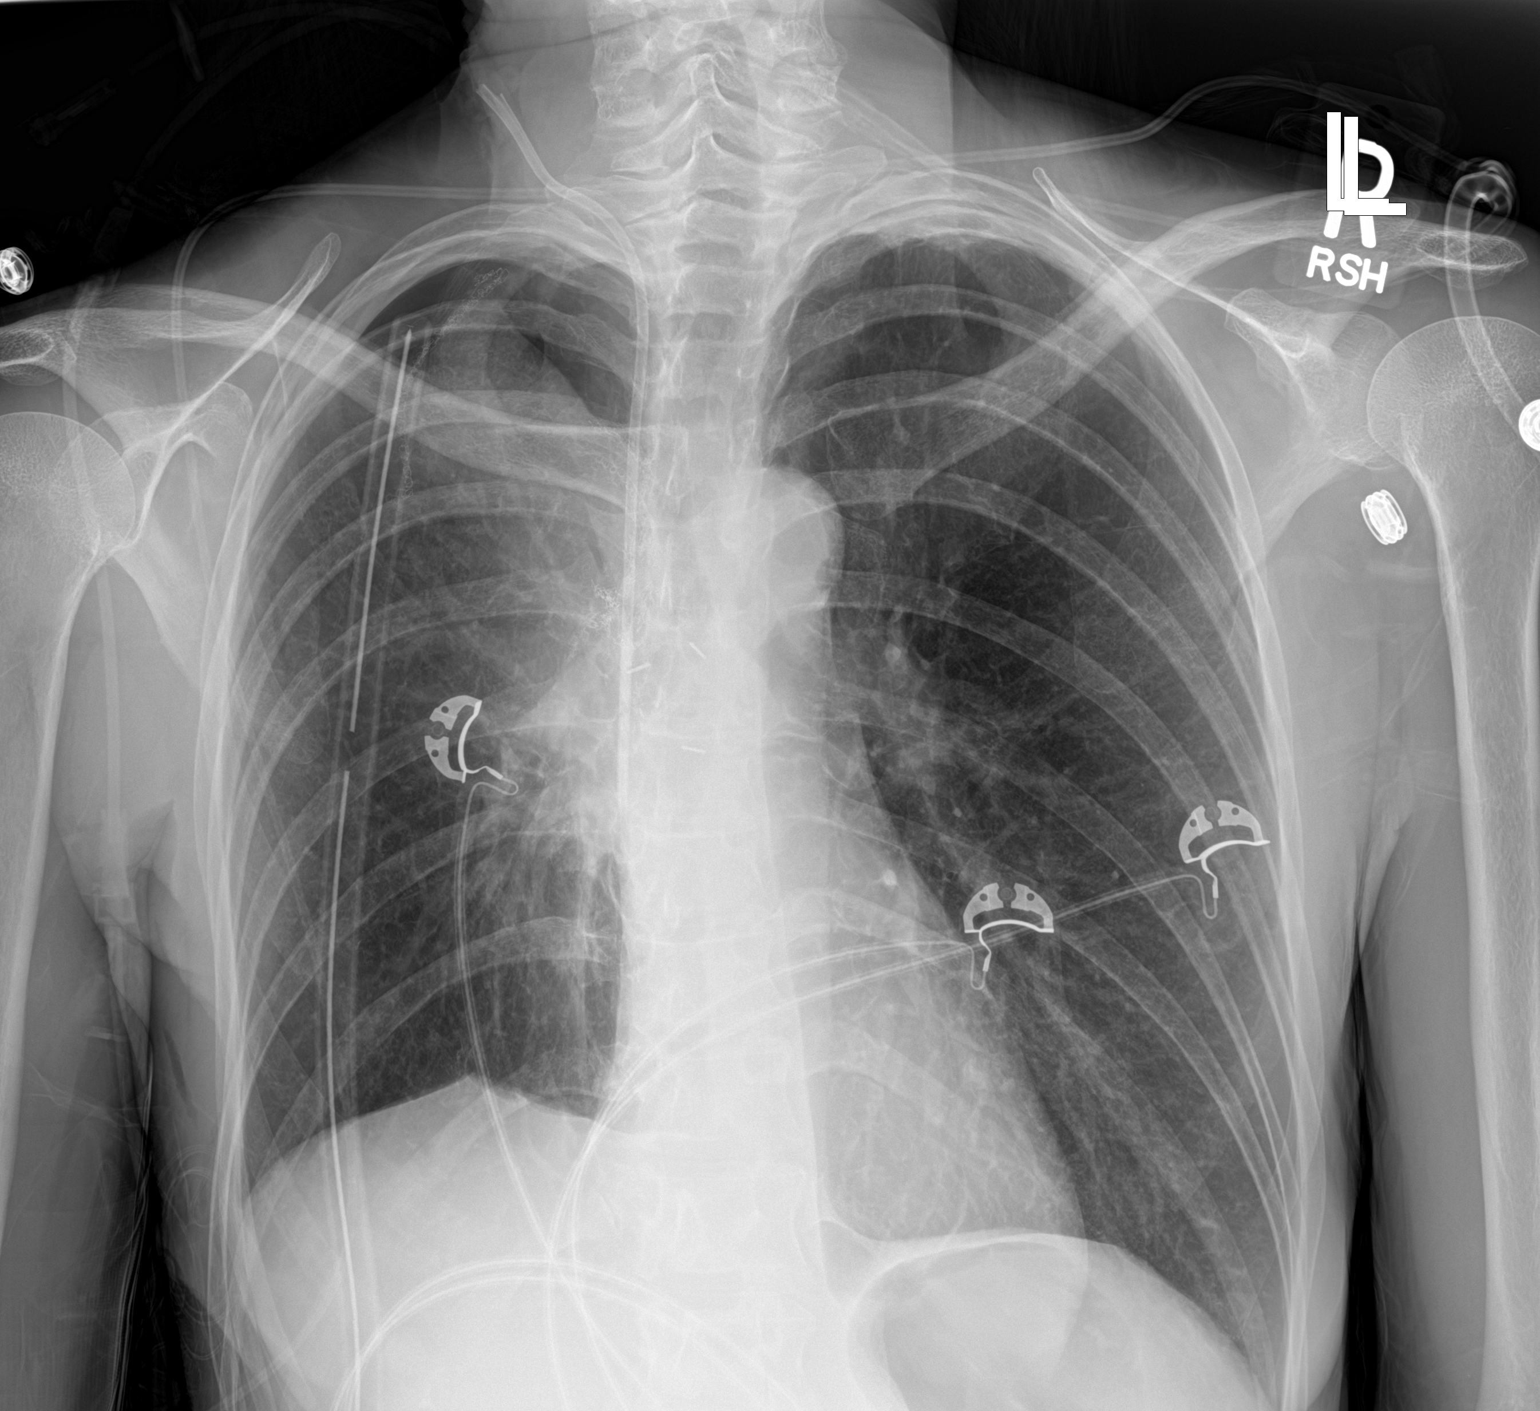

[1 of 1 positions shown; findings below may reference images not displayed]

FINDINGS: Right internal jugular line terminates at the low SVC.

Right-sided chest tube remains in place. Surgical sutures at the
right apex. Suspect a residual 5-10% right apical pneumothorax.

No pleural fluid. Right hemidiaphragm elevation. Markedly improved
right suprahilar and medial upper lung airspace disease. Normal
heart size. Atherosclerosis in the transverse aorta. Clear left
lung.
IMPRESSION: Right chest tube remaining in place. Possible residual 5-10% right
apical pneumothorax.

Significantly improved right-sided aeration.

Aortic Atherosclerosis ([id number]-[id number].M).  This is age advanced.

## 2019-02-20 IMAGING — DX DG CHEST 1V PORT
1 series · 1 of 1 positions shown · non-contrast
Comparison: 08/21/2017

CLINICAL DATA: Shortness of breath and sore chest following right
lung surgery

EXAM:
PORTABLE CHEST 1 VIEW

[chest ap]
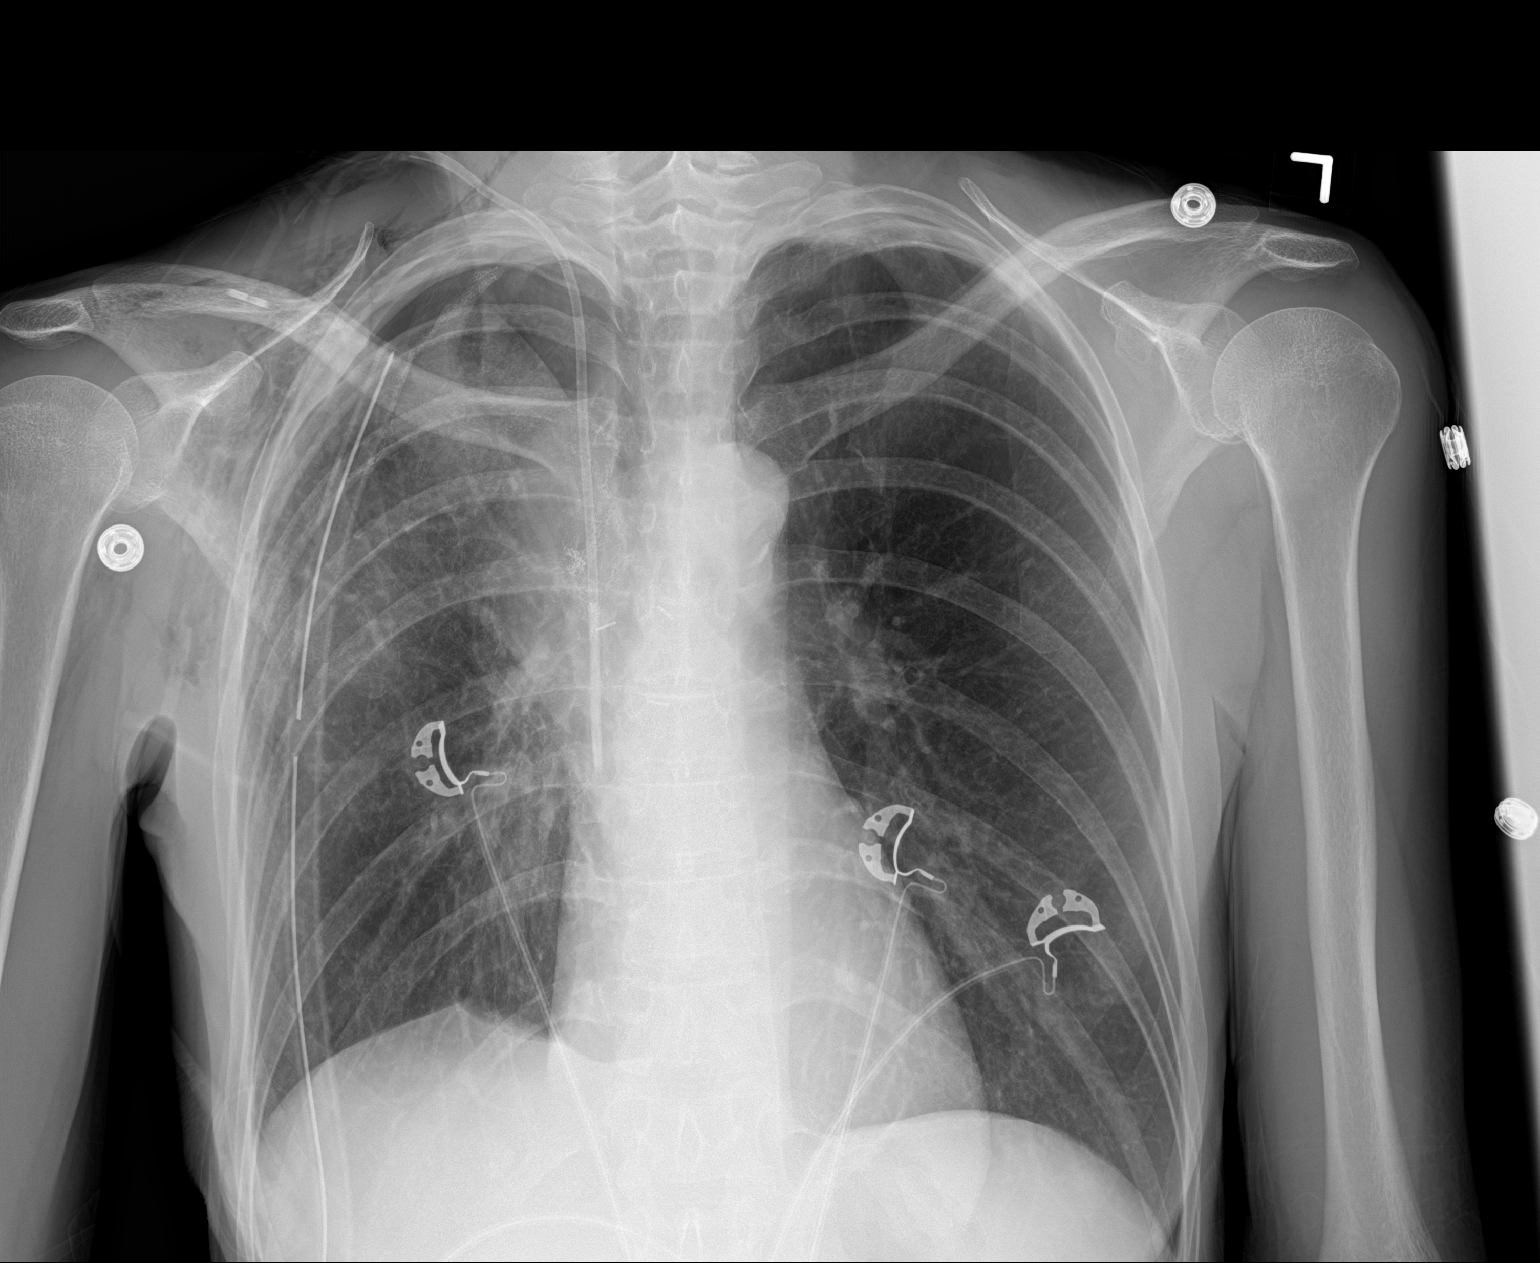

[1 of 1 positions shown; findings below may reference images not displayed]

FINDINGS: Cardiac shadow is stable. The left lung remains well aerated without
focal abnormality. Postsurgical changes are again noted on the
right. Stable right pneumothorax is noted. No new focal infiltrate
is seen. Right jugular central line is noted in satisfactory
position.
IMPRESSION: Stable right apical pneumothorax.  Chest tube remains in place.

## 2019-02-22 IMAGING — DX DG CHEST 1V PORT
1 series · 1 of 1 positions shown · non-contrast
Comparison: 08/23/2017.

CLINICAL DATA: Surgery follow-up.

EXAM:
PORTABLE CHEST 1 VIEW

[chest]
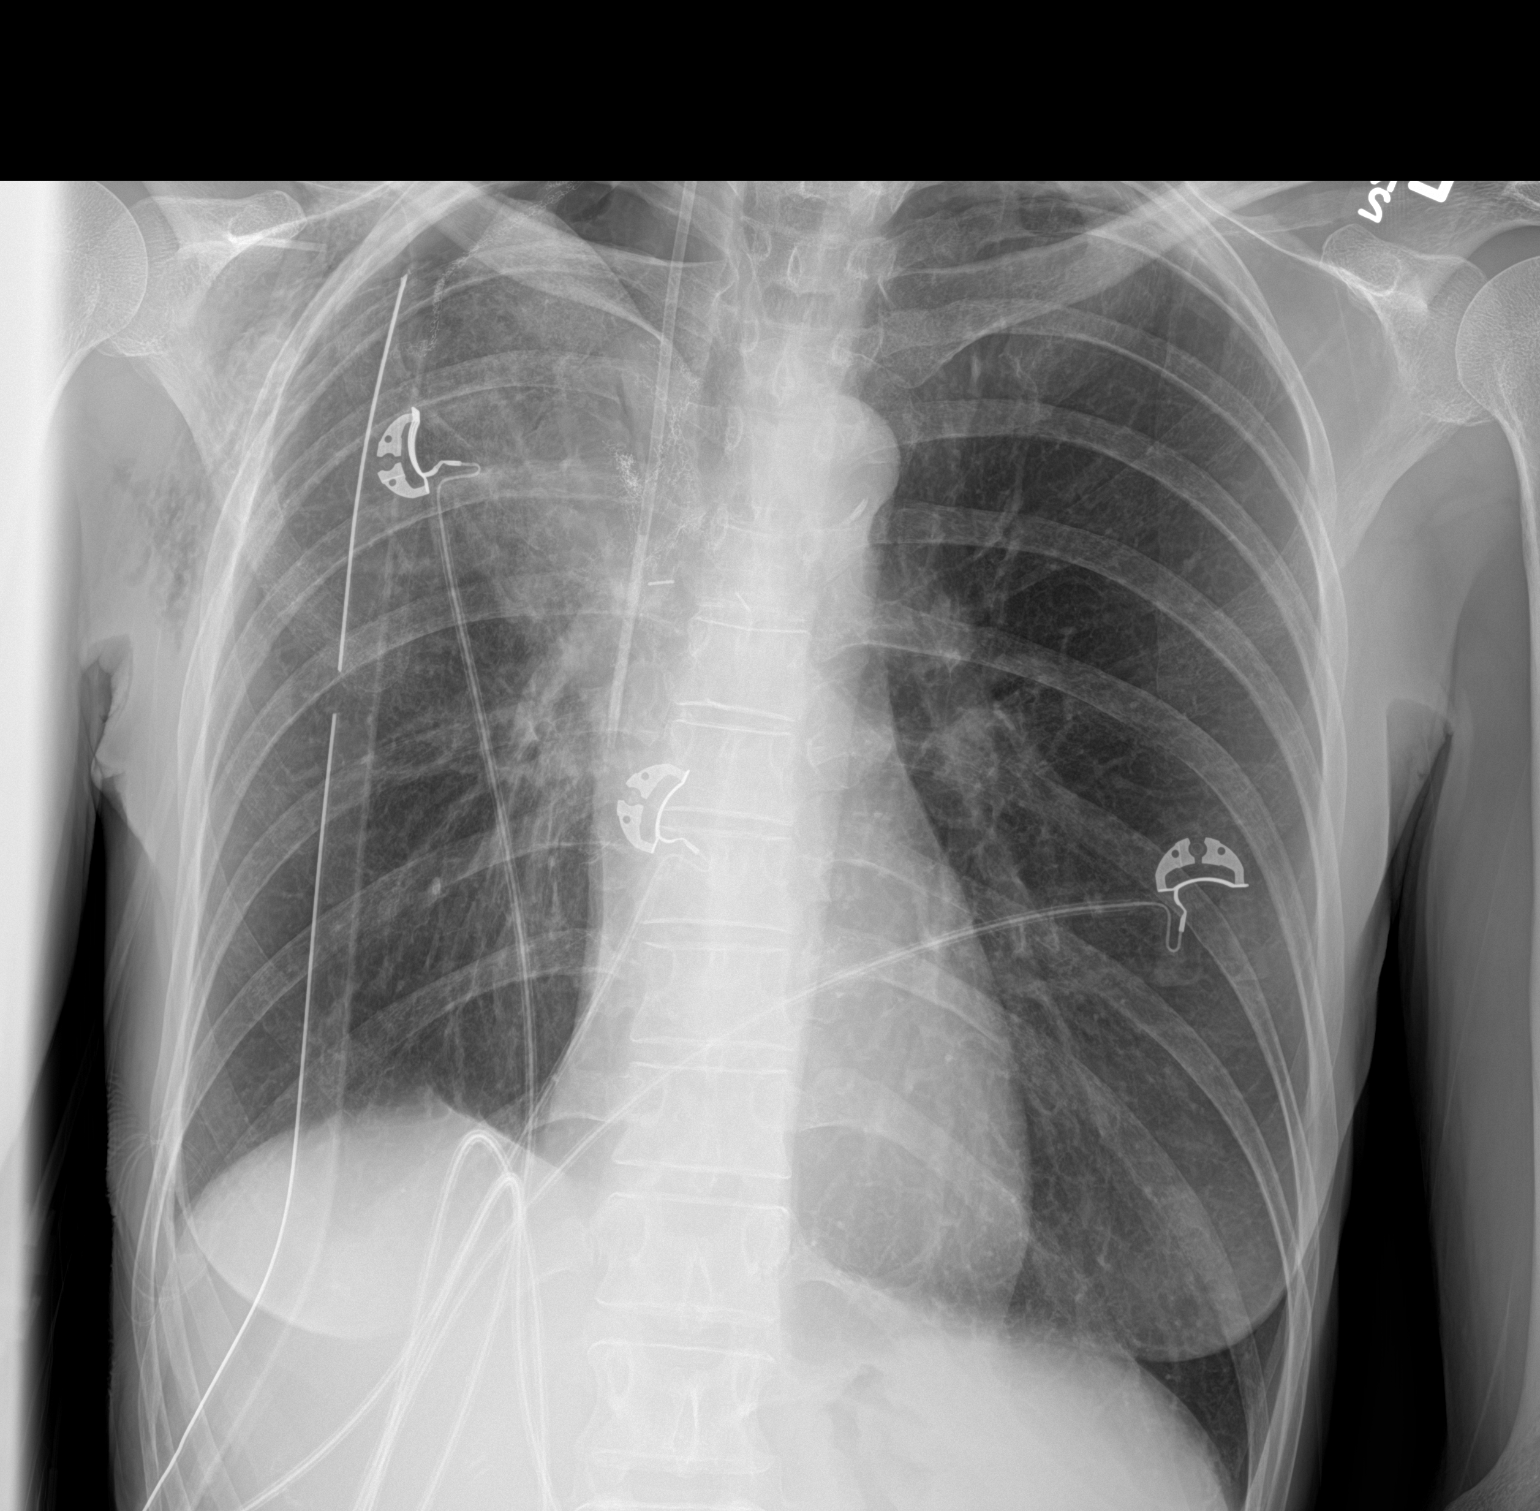

[1 of 1 positions shown; findings below may reference images not displayed]

FINDINGS: Right IJ line and right chest tube in stable position. Heart size
stable. Postsurgical changes right lung with atelectatic changes in
the right upper lung again noted. Slight improvement of right apical
pneumothorax. Right chest wall in neck subcutaneous emphysema stable
.
IMPRESSION: 1. Right IJ line and right chest tube in stable position.

2. Postsurgical changes right lung with atelectatic changes in the
right upper lung again noted. Slight improvement of right apical
pneumothorax. Right chest wall and neck subcutaneous emphysema is
stable.

## 2019-02-23 IMAGING — DX DG CHEST 1V PORT
1 series · 1 of 1 positions shown · non-contrast
Comparison: 08/24/2017

CLINICAL DATA: Status post right upper lobectomy.

EXAM:
PORTABLE CHEST 1 VIEW

[chest]
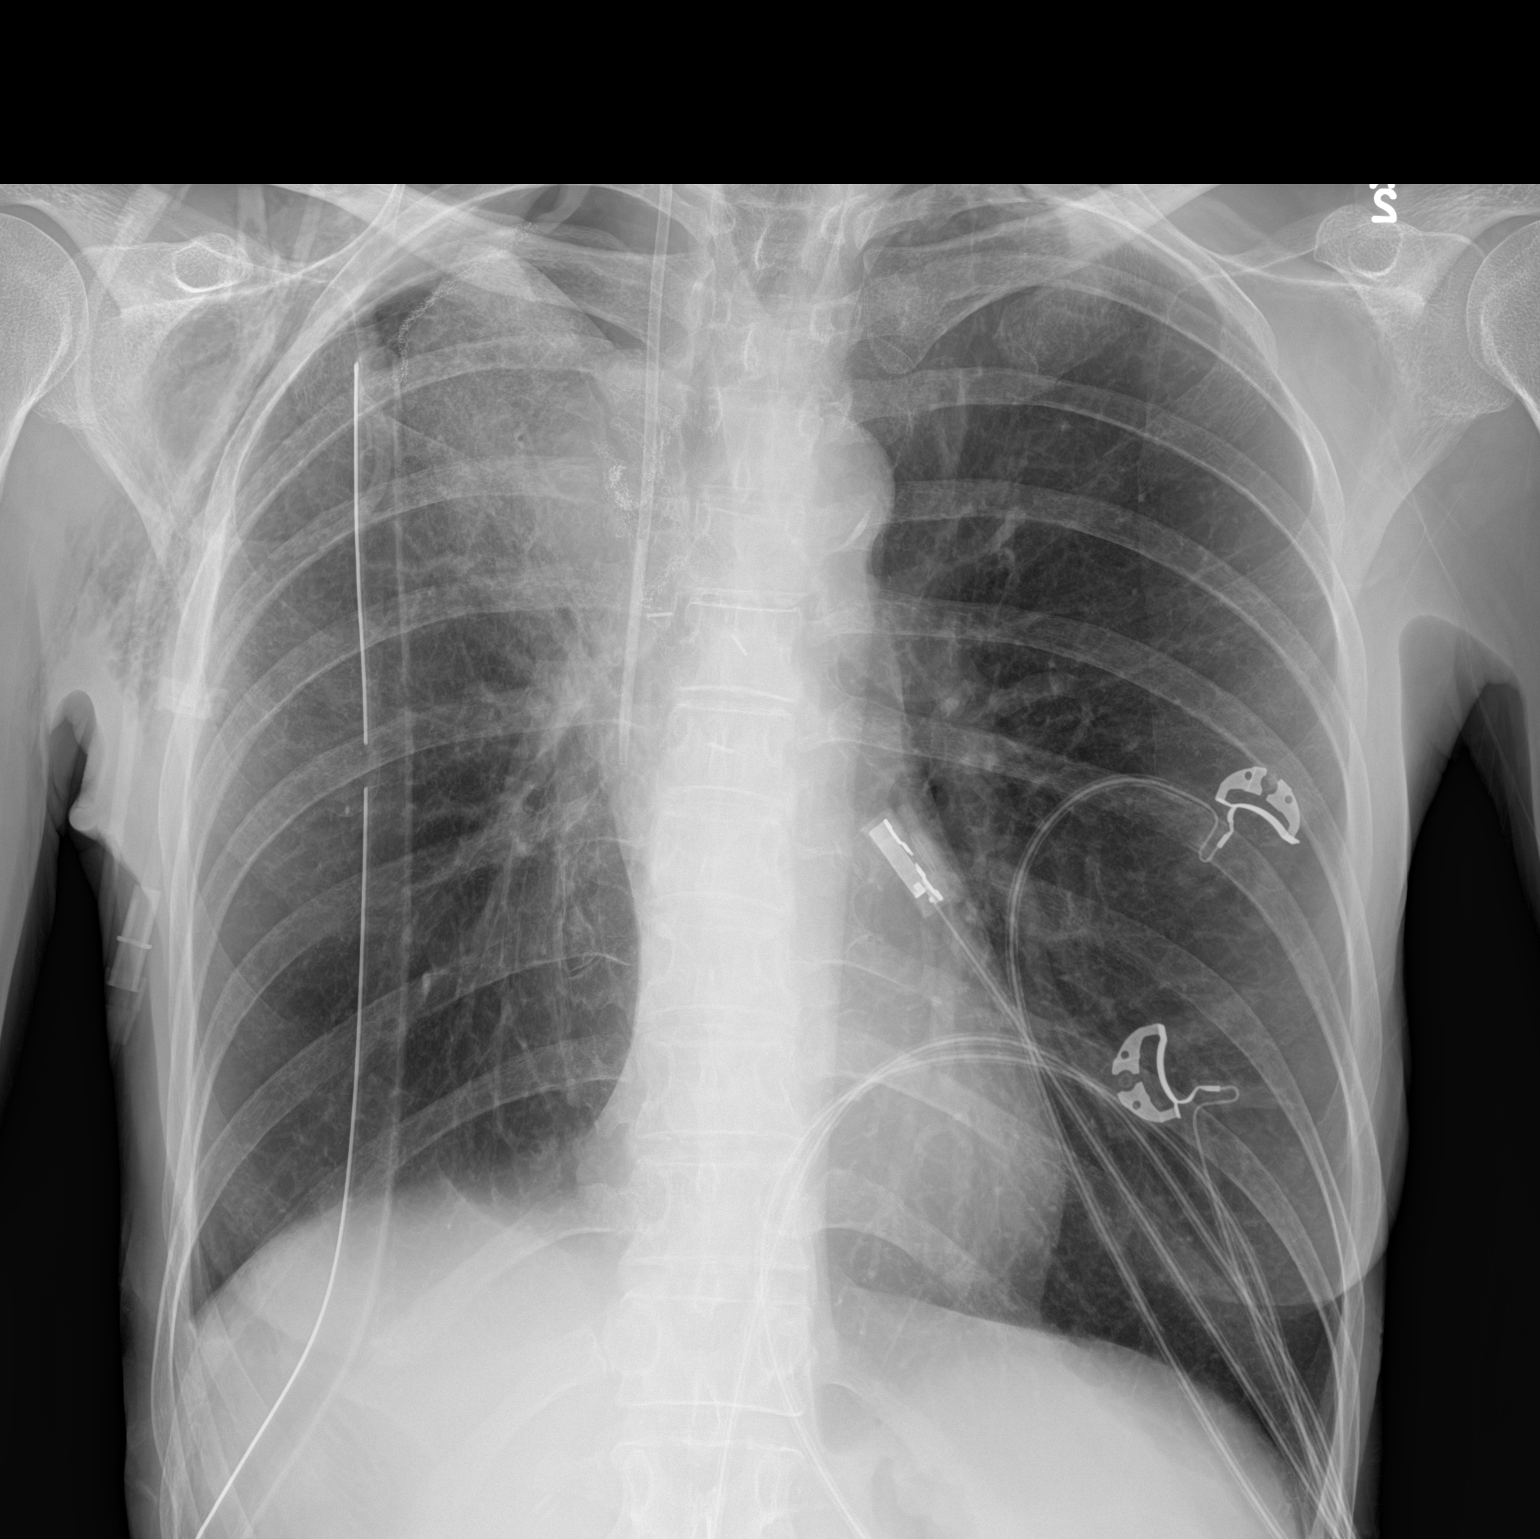

[1 of 1 positions shown; findings below may reference images not displayed]

FINDINGS: Sequelae of right upper lobectomy are again identified. A right
jugular catheter terminates over the lower SVC and a right chest
tube remains in place, both unchanged in position. The
cardiomediastinal silhouette is unchanged. A small right apical
pneumothorax and right upper lung atelectasis are unchanged. The
left lung remains clear. No sizable pleural effusion is seen. Soft
tissue emphysema is again noted in the right chest wall extending
into the neck.
IMPRESSION: Postsurgical changes with unchanged small right apical pneumothorax
and adjacent atelectasis.

## 2019-02-24 IMAGING — DX DG CHEST 1V PORT
1 series · 1 of 1 positions shown · non-contrast
Comparison: Single-view of the chest 08/24/2017 and 08/25/2017.

CLINICAL DATA: Patient status post right upper lobe wedge resection
for non-small cell carcinoma 08/20/2017. Right chest tube in place.

EXAM:
PORTABLE CHEST 1 VIEW

[chest ap]
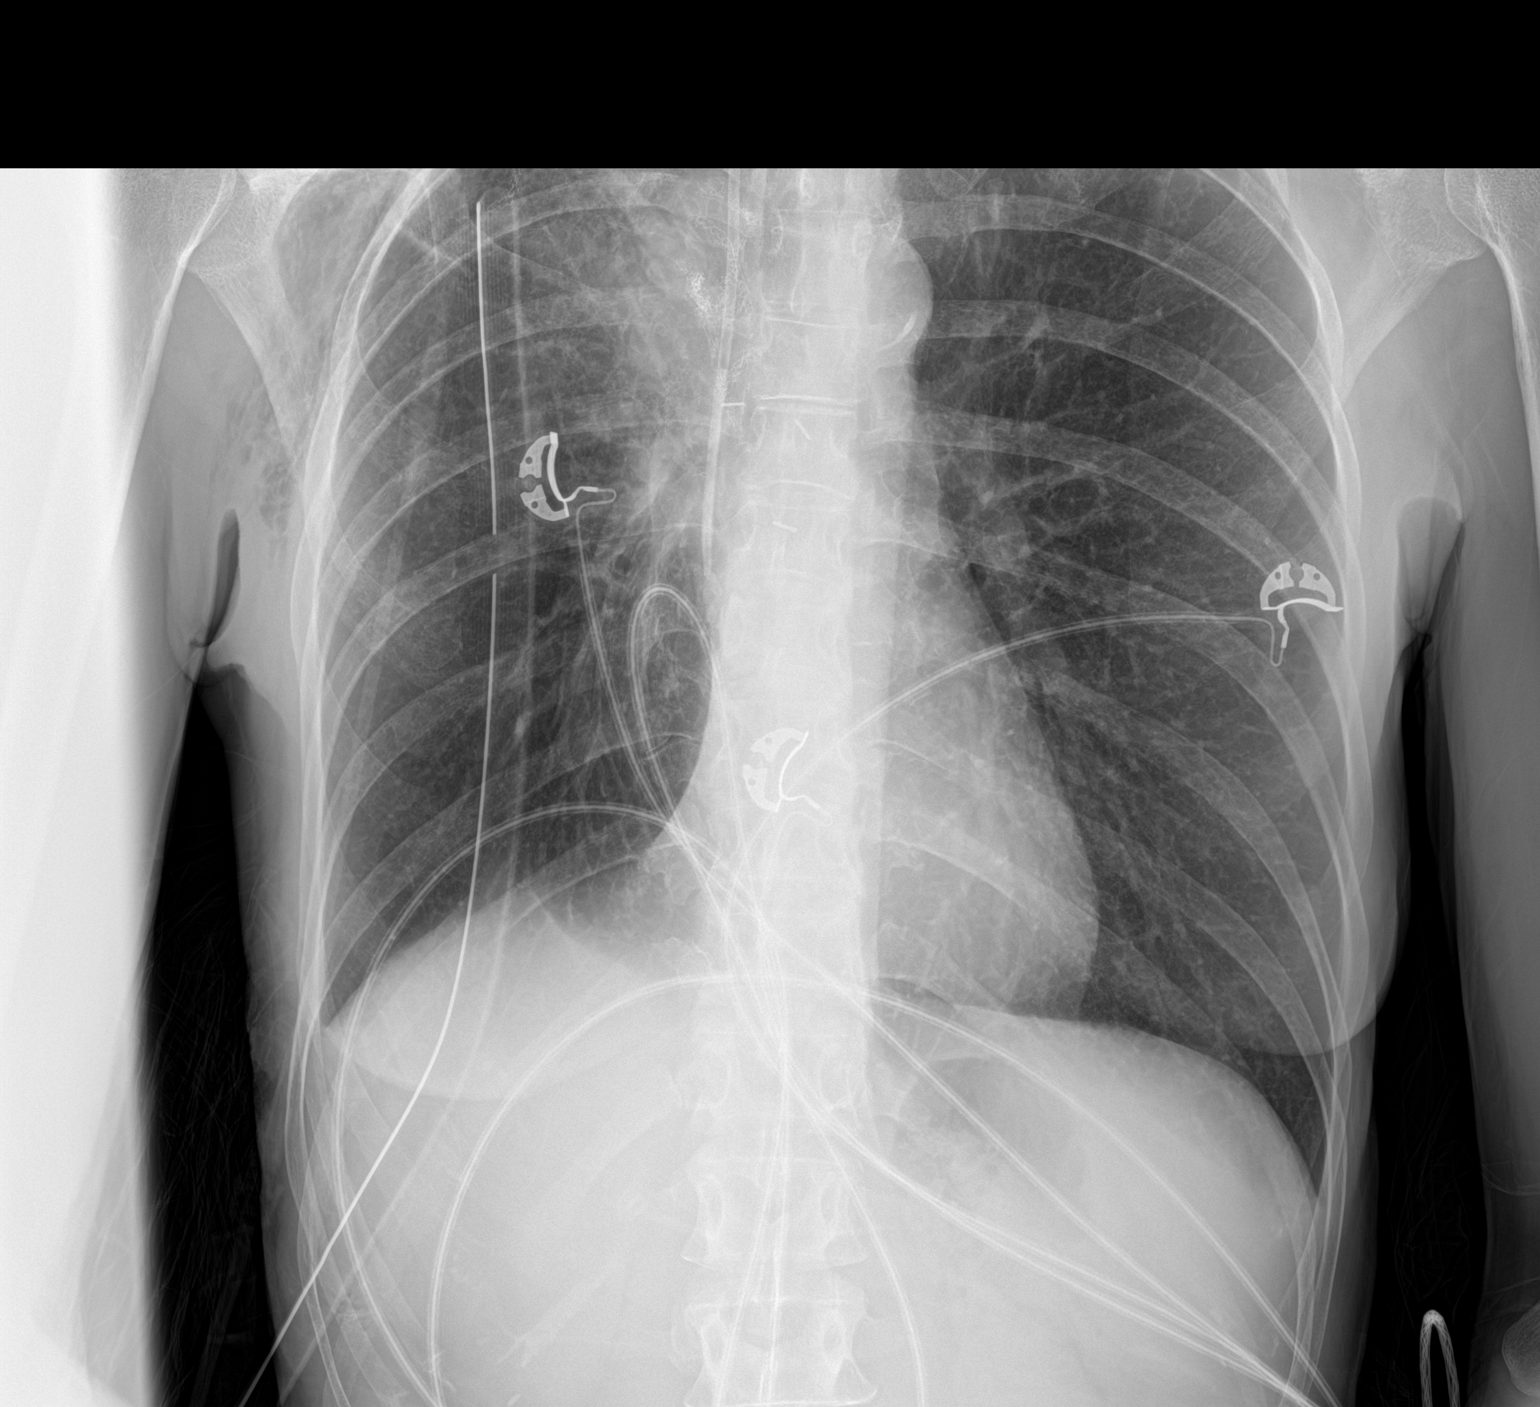

[1 of 1 positions shown; findings below may reference images not displayed]

FINDINGS: Right IJ catheter and right chest tube are unchanged. Small right
apical pneumothorax has slightly decreased in size since yesterday's
examination. The left lung is expanded and clear. Subcutaneous
emphysema right upper chest wall is noted. Aortic atherosclerosis is
seen.
IMPRESSION: Small right apical pneumothorax has decreased in size somewhat since
yesterday's examination with a chest tube in place. No new
abnormality.

## 2019-02-25 ENCOUNTER — Telehealth: Payer: Self-pay

## 2019-02-25 NOTE — Telephone Encounter (Signed)
rtc lm for rtc 

## 2019-02-25 NOTE — Telephone Encounter (Signed)
Requesting to speak with a nurse about cough. Please call back.

## 2019-02-25 NOTE — Telephone Encounter (Signed)
Pt calls back and states she is worse than she was 4/28. Took all abx and feels worse, denies fevers.worried it is r/t lung cancer appt 5/27 dr Eileen Stanford

## 2019-02-26 ENCOUNTER — Other Ambulatory Visit: Payer: Self-pay

## 2019-02-26 ENCOUNTER — Ambulatory Visit (INDEPENDENT_AMBULATORY_CARE_PROVIDER_SITE_OTHER): Payer: 59 | Admitting: Internal Medicine

## 2019-02-26 VITALS — BP 119/58 | HR 78 | Temp 98.9°F | Ht 63.25 in | Wt 107.0 lb

## 2019-02-26 DIAGNOSIS — R05 Cough: Secondary | ICD-10-CM | POA: Diagnosis not present

## 2019-02-26 DIAGNOSIS — Z87891 Personal history of nicotine dependence: Secondary | ICD-10-CM

## 2019-02-26 DIAGNOSIS — R0602 Shortness of breath: Secondary | ICD-10-CM | POA: Diagnosis not present

## 2019-02-26 DIAGNOSIS — Z85118 Personal history of other malignant neoplasm of bronchus and lung: Secondary | ICD-10-CM | POA: Diagnosis not present

## 2019-02-26 DIAGNOSIS — R059 Cough, unspecified: Secondary | ICD-10-CM

## 2019-02-26 MED ORDER — HYDROCOD POLST-CPM POLST ER 10-8 MG/5ML PO SUER: 5.0000 mL | Freq: Two times a day (BID) | ORAL | 0 refills | Status: DC | PRN

## 2019-02-26 MED FILL — HYDROCODONE-CHLORPHEN ER SU: 10-8 | 14 days supply | Qty: 140 | Fill #0

## 2019-02-26 NOTE — Patient Instructions (Signed)
Thank you for visiting clinic today. I am testing you for COVID-19-we will call you with results. Please stay at home and use mask when around other people till will get your results. Also checking some basic labs. Giving you Tussionex syrup, you can use it twice daily if needed for your cough.

## 2019-02-26 NOTE — Telephone Encounter (Signed)
Thank you Bonnita Nasuti. I agree with in person appointment

## 2019-02-26 NOTE — Assessment & Plan Note (Signed)
>>  ASSESSMENT AND PLAN FOR COUGH WRITTEN ON 02/26/2019  5:11 PM BY Arnetha Courser, MD  Patient has a longstanding history of cough with recent exacerbation. She is high risk due to her history of lung cancer. Be some COPD element due to her history of smoking. 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  -We will test her for COVID-19 although pretest probability is low. -Check CBC with differential and BMP. -Tussionex cough syrup for symptomatic relief. -Can give her a trial of PPI during next follow-up visit if her symptoms continue to get worse.

## 2019-02-26 NOTE — Assessment & Plan Note (Signed)
Patient has a longstanding history of cough with recent exacerbation. She is high risk due to her history of lung cancer. Be some COPD element due to her history of smoking. 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  -We will test her for COVID-19 although pretest probability is low. -Check CBC with differential and BMP. -Tussionex cough syrup for symptomatic relief. -Can give her a trial of PPI during next follow-up visit if her symptoms continue to get worse.

## 2019-02-26 NOTE — Progress Notes (Signed)
   CC: Worsening dry cough and shortness of breath.  HPI:  Heather [name] is a 48 y.o. with past medical history as listed below came to the clinic with complaint of worsening of her chronic dry cough and exertional dyspnea. Patient is worried that her lung cancer is back, saw her oncologist in December 2019 and will have a follow-up on April 02, 2019.  She is also due for a repeat CT chest. She recently went to an urgent care with complaint of worsening cough and was given a one-week course of doxycycline which she completed with no relief. Per patient she cannot sleep at night, cough get worse while lying flat and she is using multiple pillows to prop herself up.  There is no daytime or nighttime exacerbation of her cough.  Tessalon Perles only helped for a very short  time.  He was also complaining of worsening exertional dyspnea, she has to take a break while walking from parking lot to the hospital building.  She denies any nasal congestion, postnasal drip, fever or chills, sick contacts or recent travels.  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  She quit smoking after her lung surgery in September 2018. She was also complaining of left-sided pleuritic chest pain, this is also a chronic problem and going on for many months.  Coughing make it worse. She denies any nausea, vomiting, diarrhea or recent change in her appetite or bowel habits. No change in her taste or smell.  Please see assessment and plan for her chronic conditions.  Past Medical History:  Diagnosis Date  . Blood dyscrasia   . COPD (chronic obstructive pulmonary disease) ([place])   . Current smoker   . Difficult intubation    pt was told difficult intubation at [facility]  . 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 Headache    "weekly" (09/04/2017)  . Heart murmur   . Lung cancer ([place]) dx'd 08/2017  . Pre-diabetes    . Rapid resting heart rate    happens infrequently   Review of Systems: Negative except mentioned in HPI.  Physical Exam:  Vitals:   02/26/19 1425 02/26/19 1427  BP:  (!) 119/58  Pulse:  78  Temp:  98.9 F (37.2 C)  TempSrc:  Oral  SpO2:  100%  Weight: 107 lb (48.5 kg)   Height: 5' 3.25" (1.607 m)    Orthostatic VS for the past 24 hrs:  BP- Lying Pulse- Lying BP- Sitting Pulse- Sitting BP- Standing at 0 minutes Pulse- Standing at 0 minutes  02/26/19 1452 119/82 76 117/81 77 105/82 87   General: Vital signs reviewed.  Patient is well-developed and well-nourished, in no acute distress and cooperative with exam.  Head: Normocephalic and atraumatic. Eyes: EOMI, conjunctivae normal, no scleral icterus.  Neck: Supple, trachea midline, normal ROM, no JVD, Cardiovascular: RRR, S1 normal, S2 normal, no murmurs, gallops, or rubs. Pulmonary/Chest: Clear to auscultation bilaterally, no wheezes, rales, or rhonchi. Abdominal: Soft, non-tender, non-distended, BS +, Extremities: No lower extremity edema bilaterally,  pulses symmetric and intact bilaterally. No cyanosis or clubbing. Skin: Warm, dry and intact. No rashes or erythema. Psychiatric: Normal mood and affect. speech and behavior is normal. Cognition and memory are normal.  Assessment & Plan:   See Encounters Tab for problem based charting.  Patient discussed with Dr. Angelia Mould.

## 2019-02-27 LAB — CBC WITH DIFFERENTIAL/PLATELET
Basophils Absolute: 0.1 10*3/uL (ref 0.0–0.2)
Basos: 1 %
EOS (ABSOLUTE): 0.1 10*3/uL (ref 0.0–0.4)
Eos: 1 %
Hematocrit: 37.7 % (ref 34.0–46.6)
Hemoglobin: 13.1 g/dL (ref 11.1–15.9)
Immature Grans (Abs): 0 10*3/uL (ref 0.0–0.1)
Immature Granulocytes: 0 %
Lymphocytes Absolute: 1.5 10*3/uL (ref 0.7–3.1)
Lymphs: 24 %
MCH: 31.5 pg (ref 26.6–33.0)
MCHC: 34.7 g/dL (ref 31.5–35.7)
MCV: 91 fL (ref 79–97)
Monocytes Absolute: 0.4 10*3/uL (ref 0.1–0.9)
Monocytes: 6 %
Neutrophils Absolute: 4.4 10*3/uL (ref 1.4–7.0)
Neutrophils: 68 %
Platelets: 146 10*3/uL — ABNORMAL LOW (ref 150–450)
RBC: 4.16 x10E6/uL (ref 3.77–5.28)
RDW: 12.9 % (ref 11.7–15.4)
WBC: 6.4 10*3/uL (ref 3.4–10.8)

## 2019-02-27 LAB — BMP8+ANION GAP
Anion Gap: 18 mmol/L (ref 10.0–18.0)
BUN/Creatinine Ratio: 16 (ref 9–23)
BUN: 11 mg/dL (ref 6–24)
CO2: 21 mmol/L (ref 20–29)
Calcium: 9.8 mg/dL (ref 8.7–10.2)
Chloride: 104 mmol/L (ref 96–106)
Creatinine, Ser: 0.68 mg/dL (ref 0.57–1.00)
GFR calc Af Amer: 119 mL/min/{1.73_m2} (ref >59)
GFR calc non Af Amer: 103 mL/min/{1.73_m2} (ref >59)
Glucose: 89 mg/dL (ref 65–99)
Potassium: 4 mmol/L (ref 3.5–5.2)
Sodium: 143 mmol/L (ref 134–144)

## 2019-02-27 LAB — INPATIENT

## 2019-02-27 LAB — NOVEL CORONAVIRUS, NAA: SARS-CoV-2, NAA: NOT DETECTED

## 2019-03-04 ENCOUNTER — Other Ambulatory Visit: Payer: Self-pay | Admitting: Internal Medicine

## 2019-03-04 MED ORDER — DIPHENHYDRAMINE HCL 25 MG PO TABS: 25.0000 mg | ORAL_TABLET | Freq: Once | ORAL | 0 refills | Status: DC

## 2019-03-04 MED ORDER — PREDNISONE 50 MG PO TABS: ORAL_TABLET | ORAL | 0 refills | Status: DC

## 2019-03-04 MED FILL — predniSONE 50 MG TABS: 50 | 1 days supply | Qty: 3 | Fill #0

## 2019-03-04 NOTE — Progress Notes (Signed)
Internal Medicine Clinic Attending  Case discussed with Dr. Amin at the time of the visit.  We reviewed the resident's history and exam and pertinent patient test results.  I agree with the assessment, diagnosis, and plan of care documented in the resident's note.    

## 2019-03-11 ENCOUNTER — Telehealth: Payer: Self-pay | Admitting: Internal Medicine

## 2019-03-11 NOTE — Telephone Encounter (Signed)
MM PAL 7/1 moved from 7/1 to 7/14. Other appointments remain the same. Left message. Schedule mailed.

## 2019-03-19 ENCOUNTER — Other Ambulatory Visit: Payer: Self-pay | Admitting: *Deleted

## 2019-03-19 MED ORDER — DIPHENHYDRAMINE HCL 25 MG PO TABS: 25.0000 mg | ORAL_TABLET | Freq: Once | ORAL | 0 refills | Status: DC

## 2019-03-19 MED ORDER — PREDNISONE 50 MG PO TABS: ORAL_TABLET | ORAL | 0 refills | Status: DC

## 2019-03-19 MED FILL — predniSONE 50 MG TABS: 50 | 1 days supply | Qty: 3 | Fill #0

## 2019-03-19 NOTE — Telephone Encounter (Signed)
Received call from patient. She is requesting refill of prednisone and benadryl as pre-meds prior to CT Scan she has scheduled for next week. Refills sent to [facility]

## 2019-03-28 ENCOUNTER — Other Ambulatory Visit: Payer: Self-pay

## 2019-03-28 ENCOUNTER — Ambulatory Visit (HOSPITAL_COMMUNITY)
Admission: RE | Admit: 2019-03-28 | Discharge: 2019-03-28 | Disposition: A | Discharge status: Home / Self Care | Payer: 59 | Source: Ambulatory Visit | Attending: Internal Medicine | Admitting: Internal Medicine

## 2019-03-28 DIAGNOSIS — C3490 Malignant neoplasm of unspecified part of unspecified bronchus or lung: Secondary | ICD-10-CM | POA: Diagnosis not present

## 2019-03-28 DIAGNOSIS — J439 Emphysema, unspecified: Secondary | ICD-10-CM | POA: Diagnosis not present

## 2019-03-28 MED ORDER — IOHEXOL 300 MG/ML  SOLN
75.0000 mL | Freq: Once | INTRAMUSCULAR | Status: AC | PRN
  Administered 2019-03-28: 75 mL via INTRAVENOUS

## 2019-03-28 MED ORDER — SODIUM CHLORIDE (PF) 0.9 % IJ SOLN
INTRAMUSCULAR | Status: AC
  Filled 2019-03-28: qty 50

## 2019-03-31 ENCOUNTER — Inpatient Hospital Stay: Payer: 59 | Attending: Internal Medicine

## 2019-03-31 ENCOUNTER — Other Ambulatory Visit: Payer: Self-pay

## 2019-03-31 DIAGNOSIS — Z85118 Personal history of other malignant neoplasm of bronchus and lung: Secondary | ICD-10-CM | POA: Diagnosis not present

## 2019-03-31 DIAGNOSIS — C3490 Malignant neoplasm of unspecified part of unspecified bronchus or lung: Secondary | ICD-10-CM

## 2019-03-31 LAB — CBC WITH DIFFERENTIAL (CANCER CENTER ONLY)
Abs Immature Granulocytes: 0.03 10*3/uL (ref 0.00–0.07)
Basophils Absolute: 0.1 10*3/uL (ref 0.0–0.1)
Basophils Relative: 1 %
Eosinophils Absolute: 0.1 10*3/uL (ref 0.0–0.5)
Eosinophils Relative: 1 %
HCT: 44.7 % (ref 36.0–46.0)
Hemoglobin: 14.4 g/dL (ref 12.0–15.0)
Immature Granulocytes: 0 %
Lymphocytes Relative: 13 %
Lymphs Abs: 1.1 10*3/uL (ref 0.7–4.0)
MCH: 30.8 pg (ref 26.0–34.0)
MCHC: 32.2 g/dL (ref 30.0–36.0)
MCV: 95.7 fL (ref 80.0–100.0)
Monocytes Absolute: 0.4 10*3/uL (ref 0.1–1.0)
Monocytes Relative: 5 %
Neutro Abs: 6.9 10*3/uL (ref 1.7–7.7)
Neutrophils Relative %: 80 %
Platelet Count: 147 10*3/uL — ABNORMAL LOW (ref 150–400)
RBC: 4.67 MIL/uL (ref 3.87–5.11)
RDW: 13.6 % (ref 11.5–15.5)
WBC Count: 8.6 10*3/uL (ref 4.0–10.5)
nRBC: 0 % (ref 0.0–0.2)

## 2019-03-31 LAB — CMP (CANCER CENTER ONLY)
ALT: 12 U/L (ref 0–44)
AST: 13 U/L — ABNORMAL LOW (ref 15–41)
Albumin: 4.4 g/dL (ref 3.5–5.0)
Alkaline Phosphatase: 54 U/L (ref 38–126)
Anion gap: 9 (ref 5–15)
BUN: 11 mg/dL (ref 6–20)
CO2: 25 mmol/L (ref 22–32)
Calcium: 9.1 mg/dL (ref 8.9–10.3)
Chloride: 108 mmol/L (ref 98–111)
Creatinine: 0.8 mg/dL (ref 0.44–1.00)
GFR, Est AFR Am: >60 mL/min (ref >60)
GFR, Estimated: >60 mL/min (ref >60)
Glucose, Bld: 116 mg/dL — ABNORMAL HIGH (ref 70–99)
Potassium: 4.3 mmol/L (ref 3.5–5.1)
Sodium: 142 mmol/L (ref 135–145)
Total Bilirubin: 0.3 mg/dL (ref 0.3–1.2)
Total Protein: 6.8 g/dL (ref 6.5–8.1)

## 2019-04-02 ENCOUNTER — Ambulatory Visit: Payer: 59 | Admitting: Internal Medicine

## 2019-04-15 ENCOUNTER — Inpatient Hospital Stay: Payer: 59 | Attending: Internal Medicine | Admitting: Internal Medicine

## 2019-04-15 ENCOUNTER — Encounter: Payer: Self-pay | Admitting: Internal Medicine

## 2019-04-15 ENCOUNTER — Other Ambulatory Visit: Payer: Self-pay

## 2019-04-15 DIAGNOSIS — J439 Emphysema, unspecified: Secondary | ICD-10-CM | POA: Diagnosis not present

## 2019-04-15 DIAGNOSIS — Z85118 Personal history of other malignant neoplasm of bronchus and lung: Secondary | ICD-10-CM | POA: Diagnosis not present

## 2019-04-15 DIAGNOSIS — R05 Cough: Secondary | ICD-10-CM | POA: Insufficient documentation

## 2019-04-15 DIAGNOSIS — C3490 Malignant neoplasm of unspecified part of unspecified bronchus or lung: Secondary | ICD-10-CM

## 2019-04-15 NOTE — Progress Notes (Signed)
[place] [phone]   [phone]  OFFICE PROGRESS NOTE  Aldine Contes, MD [address], [address] [place] [address]  DIAGNOSIS: Stage IA (T1a, N0, M0) moderately differentiated adenocarcinoma diagnosed in November 2018.  PRIOR THERAPY: Status post right upper lobectomy with mediastinal lymph node dissection under the care of Dr. Roxan Hockey on August 20, 2017.  CURRENT THERAPY: Observation.  INTERVAL HISTORY: Heather [name] 48 y.o. female returns to the clinic today for 6 months follow-up visit.  The patient is feeling fine today with no concerning complaints.  She denied having any chest pain, shortness of breath, but has dry cough with no hemoptysis.  She denied having any recent weight loss or night sweats.  She has no nausea, vomiting, diarrhea or constipation.  She had repeat CT scan of the chest last month and she is here for evaluation and discussion of her scan results.  MEDICAL HISTORY: Past Medical History:  Diagnosis Date  . Blood dyscrasia   . COPD (chronic obstructive pulmonary disease) ([place])   . Current smoker   . Difficult intubation    pt was told difficult intubation [facility]  . 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 Headache    "weekly" (09/04/2017)  . Heart murmur   . Lung cancer ([place]) dx'd 08/2017  . Pre-diabetes   . Rapid resting heart rate    happens infrequently    ALLERGIES:  is allergic to iohexol and aspirin.  MEDICATIONS:  Current Outpatient Medications  Medication Sig Dispense Refill  . benzonatate (TESSALON) 100 MG capsule Take 1-2 capsules (100-200 mg total) by mouth 3 (three) times daily. (Patient not taking: Reported on 04/15/2019) 30 capsule 0  . chlorpheniramine-HYDROcodone (TUSSIONEX PENNKINETIC ER) 10-8 MG/5ML SUER Take 5 mLs by mouth every 12 (twelve) hours as needed for cough. (Patient not taking: Reported on 04/15/2019) 140 mL 0  . diphenhydrAMINE  (BENADRYL) 25 MG tablet Take 1 tablet (25 mg total) by mouth once for 1 dose. 2 hours before CT scan 1 tablet 0  . predniSONE (DELTASONE) 50 MG tablet One tab po 13, 7 and 2 hours before CT scan (Patient not taking: Reported on 04/15/2019) 3 tablet 0   No current facility-administered medications for this visit.     SURGICAL HISTORY:  Past Surgical History:  Procedure Laterality Date  . CESAREAN SECTION  1992; 2012  . COLONOSCOPY N/A 05/15/2014   Procedure: COLONOSCOPY;  Surgeon: Lafayette Dragon, MD;  [facility]: [facility];  Service: Endoscopy;  Laterality: N/A;  . ESOPHAGOGASTRODUODENOSCOPY N/A 05/15/2014   Procedure: ESOPHAGOGASTRODUODENOSCOPY (EGD);  Surgeon: Lafayette Dragon, MD;  Location: [facility];  Service: Endoscopy;  Laterality: N/A;  . LAPAROSCOPY  07/07/2011   Procedure: LAPAROSCOPY DIAGNOSTIC;  Surgeon: Elveria Royals;  Location: [facility];  Service: Gynecology;  Laterality: N/A;  . LESION REMOVAL  07/07/2011   Procedure: EXCISION VAGINAL LESION;  Surgeon: Elveria Royals;  Location: [facility];  Service: Gynecology;  Laterality: N/A;  . LOBECTOMY Right 08/20/2017   Procedure: RIGHT UPPER LOBE LOBECTOMY;  Surgeon: [name], MD;  Location: [facility];  Service: Thoracic;  Laterality: Right;  . LYMPH NODE DISSECTION N/A 08/20/2017   Procedure: LYMPH NODE DISSECTION;  Surgeon: [name], MD;  Location: [facility];  Service: Thoracic;  Laterality: N/A;  . VAGINAL HYSTERECTOMY  ~ 2014  . VIDEO ASSISTED THORACOSCOPY (VATS)/WEDGE RESECTION Right 08/20/2017   Procedure: RIGHT VIDEO ASSISTED THORACOSCOPY (  VATS)/WEDGE RESECTION;  Surgeon: [name], MD;  Location[facility];  Service: Thoracic;  Laterality: Right;    REVIEW OF SYSTEMS:  A comprehensive review of systems was negative except for: Respiratory: positive for cough   PHYSICAL EXAMINATION: General appearance: alert, cooperative and no distress Head: Normocephalic, without obvious abnormality, atraumatic Neck: no  adenopathy, no JVD, supple, symmetrical, trachea midline and thyroid not enlarged, symmetric, no tenderness/mass/nodules Lymph nodes: Cervical, supraclavicular, and axillary nodes normal. Resp: clear to auscultation bilaterally Back: symmetric, no curvature. ROM normal. No CVA tenderness. Cardio: regular rate and rhythm, S1, S2 normal, no murmur, click, rub or gallop GI: soft, non-tender; bowel sounds normal; no masses,  no organomegaly Extremities: extremities normal, atraumatic, no cyanosis or edema  ECOG PERFORMANCE STATUS: 1 - Symptomatic but completely ambulatory  Blood pressure 129/70, pulse 96, temperature 99.1 F (37.3 C), temperature source Oral, resp. rate 18, height 5' 3.25" (1.607 m), weight 104 lb 9.6 oz (47.4 kg), SpO2 99 %.  LABORATORY DATA: Lab Results  Component Value Date   WBC 8.6 03/31/2019   HGB 14.4 03/31/2019   HCT 44.7 03/31/2019   MCV 95.7 03/31/2019   PLT 147 (L) 03/31/2019      Chemistry      Component Value Date/Time   NA 142 03/31/2019 0821   NA 143 02/26/2019 1555   NA 141 07/31/2017 1054   K 4.3 03/31/2019 0821   K 3.6 07/31/2017 1054   CL 108 03/31/2019 0821   CO2 25 03/31/2019 0821   CO2 23 07/31/2017 1054   BUN 11 03/31/2019 0821   BUN 11 02/26/2019 1555   BUN 5.9 (L) 07/31/2017 1054   CREATININE 0.80 03/31/2019 0821   CREATININE 0.8 07/31/2017 1054      Component Value Date/Time   CALCIUM 9.1 03/31/2019 0821   CALCIUM 9.9 07/31/2017 1054   ALKPHOS 54 03/31/2019 0821   ALKPHOS 50 07/31/2017 1054   AST 13 (L) 03/31/2019 0821   AST 12 07/31/2017 1054   ALT 12 03/31/2019 0821   ALT 9 07/31/2017 1054   BILITOT 0.3 03/31/2019 0821   BILITOT 0.69 07/31/2017 1054       RADIOGRAPHIC STUDIES: Ct Chest W Contrast  Result Date: 03/28/2019 CLINICAL DATA:  Right-sided lung cancer. Status post right upper lobectomy. EXAM: CT CHEST WITH CONTRAST TECHNIQUE: Multidetector CT imaging of the chest was performed during intravenous contrast  administration. CONTRAST:  33mL OMNIPAQUE IOHEXOL 300 MG/ML  SOLN COMPARISON:  09/23/2018 FINDINGS: Cardiovascular: The heart size is normal. No substantial pericardial effusion. No thoracic aortic aneurysm. Mediastinum/Nodes: No mediastinal lymphadenopathy. There is no hilar lymphadenopathy. The esophagus has normal imaging features. There is no axillary lymphadenopathy. Lungs/Pleura: Centrilobular emphsyema noted. Biapical pleuroparenchymal scarring noted. Volume loss right hemithorax compatible with prior right upper lobectomy. Stable paramediastinal scarring in the right lung. No new suspicious pulmonary nodule or mass. No focal airspace consolidation. No pleural effusion. Upper Abdomen: Minimal atherosclerotic calcification of the abdominal aorta. Otherwise unremarkable. Musculoskeletal: No worrisome lytic or sclerotic osseous abnormality. IMPRESSION: 1. Stable exam. No new or progressive interval findings to suggest recurrent or metastatic disease. 2.  Emphysema. (ICD10-J43.9) 3.  Aortic Atherosclerois (ICD10-170.0) Electronically Signed   By: Misty Stanley M.D.   On: 03/28/2019 14:06    ASSESSMENT AND PLAN: This is a very pleasant 49 years old white female recently diagnosed with a stage IA non-small cell lung cancer, adenocarcinoma status post right upper lobectomy with lymph node dissection in November 2018 under the care of Dr.  Hendrickson. The patient is on observation since her surgery.  She is feeling fine with no concerning complaints except for dry cough likely secondary to her emphysema. The patient had repeat CT scan of the chest performed last months.  I personally and independently reviewed the scans and discussed the results with the patient. Her scan showed no concerning findings for disease recurrence. I recommended for the patient to continue on observation with repeat CT scan of the chest in 6 months. She was advised to call immediately if she has any concerning symptoms in the  interval. The patient voices understanding of current disease status and treatment options and is in agreement with the current care plan. All questions were answered. The patient knows to call the clinic with any problems, questions or concerns. We can certainly see the patient much sooner if necessary.  I spent 10 minutes counseling the patient face to face. The total time spent in the appointment was 15 minutes.  Disclaimer: This note was dictated with voice recognition software. Similar sounding words can inadvertently be transcribed and may not be corrected upon review.

## 2019-08-07 ENCOUNTER — Telehealth: Payer: Self-pay | Admitting: Internal Medicine

## 2019-08-07 ENCOUNTER — Other Ambulatory Visit: Payer: Self-pay | Admitting: Internal Medicine

## 2019-08-07 ENCOUNTER — Telehealth: Payer: 59 | Admitting: Physician Assistant

## 2019-08-07 DIAGNOSIS — R059 Cough, unspecified: Secondary | ICD-10-CM

## 2019-08-07 DIAGNOSIS — R05 Cough: Secondary | ICD-10-CM

## 2019-08-07 DIAGNOSIS — Z1231 Encounter for screening mammogram for malignant neoplasm of breast: Secondary | ICD-10-CM

## 2019-08-07 MED ORDER — BENZONATATE 200 MG PO CAPS: 200.0000 mg | ORAL_CAPSULE | Freq: Two times a day (BID) | ORAL | 0 refills | Status: DC | PRN

## 2019-08-07 MED FILL — BENZONATATE 200 MG CAPSULE: 200 | 10 days supply | Qty: 20 | Fill #0

## 2019-08-07 NOTE — Telephone Encounter (Signed)
Pt contact pt regarding her breast exam order [phone]

## 2019-08-07 NOTE — Telephone Encounter (Signed)
Called pt - stated she was told by the [facility], she needs ultra sound done. I asked if she's having any problems - stated she told them about having tenderness but not leakage or any other problem.  Please send new order Thanks

## 2019-08-07 NOTE — Telephone Encounter (Signed)
Do they need me to put an order in for ultrasound? Thought they usually put in what they need and send it to me? Is it for both breasts?

## 2019-08-07 NOTE — Progress Notes (Signed)
I spent 10 min on this E-Visit  Based on what you shared with me, I feel your condition warrants further evaluation and I recommend that you be seen for a face to face office visit.  I am ordering Tessalon Pearls to assist with you cough while arranging your appointment with primary MD.  NOTE: If you entered your credit card information for this eVisit, you will not be charged. You may see a "hold" on your card for the $35 but that hold will drop off and you will not have a charge processed.  If you are having a true medical emergency please call 911.     For an urgent face to face visit, [place] has four urgent care centers for your convenience:    NEW:  [facility] [place], [place] [address] .  Monday - Friday 10 am - 6 pm    . [facility]    [phone]                  Get Driving Directions  [address] [place], [place] [address] . 10 am to 8 pm Monday-Friday . 12 pm to 8 pm Saturday-Sunday   . [facility] at [place]                  Get Driving Directions  [address], [place], [place] [address] . 8 am to 8 pm Monday-Friday . 9 am to 6 pm Saturday . 11 am to 6 pm Sunday     . [facility] at [place]                  Get Driving Directions   [address] [place], [place] [address] . 8 am to 8 pm Monday-Friday . 8 am to 4 pm Saturday-Sunday    . [facility] at [place]                    Get Driving Directions  [phone]  [address] [place], [place] [address]  . Monday-Friday, 12 PM to 6 PM    Your e-visit answers were reviewed by a board certified advanced clinical practitioner to complete your personal care plan.  Thank you for using e-Visits.

## 2019-08-08 ENCOUNTER — Other Ambulatory Visit: Payer: Self-pay | Admitting: Internal Medicine

## 2019-08-08 DIAGNOSIS — N644 Mastodynia: Secondary | ICD-10-CM

## 2019-08-08 NOTE — Telephone Encounter (Signed)
Yes both breasts. Need order for diagnostic mammogram.

## 2019-08-11 ENCOUNTER — Other Ambulatory Visit: Payer: Self-pay | Admitting: Internal Medicine

## 2019-08-11 DIAGNOSIS — N644 Mastodynia: Secondary | ICD-10-CM

## 2019-08-26 ENCOUNTER — Other Ambulatory Visit: Payer: Self-pay

## 2019-08-26 ENCOUNTER — Ambulatory Visit
Admission: RE | Admit: 2019-08-26 | Discharge: 2019-08-26 | Disposition: A | Discharge status: Home / Self Care | Payer: 59 | Source: Ambulatory Visit | Attending: Internal Medicine | Admitting: Internal Medicine

## 2019-08-26 DIAGNOSIS — R922 Inconclusive mammogram: Secondary | ICD-10-CM | POA: Diagnosis not present

## 2019-08-26 DIAGNOSIS — N644 Mastodynia: Secondary | ICD-10-CM

## 2019-09-18 ENCOUNTER — Other Ambulatory Visit: Payer: Self-pay

## 2019-09-18 ENCOUNTER — Encounter (HOSPITAL_COMMUNITY): Payer: Self-pay

## 2019-09-18 ENCOUNTER — Ambulatory Visit (HOSPITAL_COMMUNITY)
Admission: EM | Admit: 2019-09-18 | Discharge: 2019-09-18 | Disposition: A | Discharge status: Home / Self Care | Payer: 59 | Attending: Family Medicine | Admitting: Family Medicine

## 2019-09-18 ENCOUNTER — Ambulatory Visit (INDEPENDENT_AMBULATORY_CARE_PROVIDER_SITE_OTHER): Payer: 59

## 2019-09-18 DIAGNOSIS — R079 Chest pain, unspecified: Secondary | ICD-10-CM | POA: Diagnosis not present

## 2019-09-18 DIAGNOSIS — R0789 Other chest pain: Secondary | ICD-10-CM | POA: Diagnosis not present

## 2019-09-18 DIAGNOSIS — Z85118 Personal history of other malignant neoplasm of bronchus and lung: Secondary | ICD-10-CM | POA: Diagnosis not present

## 2019-09-18 MED ORDER — HYDROCODONE-ACETAMINOPHEN 7.5-325 MG PO TABS: 1.0000 | ORAL_TABLET | Freq: Four times a day (QID) | ORAL | 0 refills | Status: DC | PRN

## 2019-09-18 MED ORDER — IBUPROFEN 800 MG PO TABS: 800.0000 mg | ORAL_TABLET | Freq: Three times a day (TID) | ORAL | 0 refills | Status: DC

## 2019-09-18 MED FILL — IBUPROFEN 800 MG TABS: 800 | 7 days supply | Qty: 21 | Fill #0

## 2019-09-18 MED FILL — HYDROCODON-APAP 7.5-325: 7.5-325 | 4 days supply | Qty: 15 | Fill #0

## 2019-09-18 NOTE — Discharge Instructions (Addendum)
Rest Heat to area Take ibuprofen 3 x a day with food Take hydrocodone if needed Do not drive on the pain medicine Call your doctors if the pain persists or worsens

## 2019-09-18 NOTE — ED Provider Notes (Signed)
[place]    CSN: [id number] Arrival date & time: 09/18/19  1118      History   Chief Complaint Chief Complaint  Patient presents with  . Appointment    1130  . Back Pain  . Cough  . Generalized Body Aches    HPI Heather [name] is a 48 y.o. female.   HPI  Patient is a lung cancer survivor.  She had an cancer in 2018.  She had a right-sided lobectomy for well-defined cancer, and states that she has had shortness of breath and pain with deep breath and cough off and on ever since that time.  She states that for the last 2 weeks she is having increasing pain in her left posterior chest.  Hurts with deep breath.  Hurts with movement.  Last night it woke her from sleep.  Today she states the pain is "severe".  No fever chills.  No body aches.  No headache.  No known exposure to coronavirus.  She states she is careful to avoid it.  No sputum.  No hemoptysis.  No weight loss  Past Medical History:  Diagnosis Date  . Blood dyscrasia   . COPD (chronic obstructive pulmonary disease) ([place])   . Current smoker   . Difficult intubation    pt was told [facility]  . 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 Headache    "weekly" (09/04/2017)  . Heart murmur   . Lung cancer ([place]) dx'd 08/2017  . Pre-diabetes   . Rapid resting heart rate    happens infrequently    Patient Active Problem List   Diagnosis Date Noted  . Pleuritic chest pain 06/13/2018  . Rib pain on right side 12/21/2017  . Hypokalemia 10/15/2017  . S/P lobectomy of lung 08/20/2017  . Pulmonary nodule 07/16/2017  . Cervical adenopathy 07/04/2017  . Factor V Leiden ([place]) 04/13/2015  . Prediabetes 04/13/2015  . History of colon polyps 04/13/2015  . H/O drug rash due to contrast  05/14/2014  . Loss of weight 04/02/2014  . Cough 04/02/2014  . Current smoker 04/02/2014  . Routine health maintenance 04/02/2014  . History of anemia 05/08/2007    Past Surgical  History:  Procedure Laterality Date  . CESAREAN SECTION  1992; 2012  . COLONOSCOPY N/A 05/15/2014   Procedure: COLONOSCOPY;  Surgeon: Lafayette Dragon, MD;  [facility];  Service: Endoscopy;  Laterality: N/A;  . ESOPHAGOGASTRODUODENOSCOPY N/A 05/15/2014   Procedure: ESOPHAGOGASTRODUODENOSCOPY (EGD);  Surgeon: Lafayette Dragon, MD;  Location: [facility];  Service: Endoscopy;  Laterality: N/A;  . LAPAROSCOPY  07/07/2011   Procedure: LAPAROSCOPY DIAGNOSTIC;  Surgeon: Elveria Royals;  Location: [facility];  Service: Gynecology;  Laterality: N/A;  . LESION REMOVAL  07/07/2011   Procedure: EXCISION VAGINAL LESION;  Surgeon: Elveria Royals;  Location: [facility];  Service: Gynecology;  Laterality: N/A;  . LOBECTOMY Right 08/20/2017   Procedure: RIGHT UPPER LOBE LOBECTOMY;  Surgeon: [name], MD;  Location: [facility];  Service: Thoracic;  Laterality: Right;  . LYMPH NODE DISSECTION N/A 08/20/2017   Procedure: LYMPH NODE DISSECTION;  Surgeon: [name], MD;  Location: [facility];  Service: Thoracic;  Laterality: N/A;  . VAGINAL HYSTERECTOMY  ~ 2014  . VIDEO ASSISTED THORACOSCOPY (VATS)/WEDGE RESECTION Right 08/20/2017   Procedure: RIGHT VIDEO ASSISTED THORACOSCOPY (VATS)/WEDGE RESECTION;  Surgeon: [name], MD;  Location: [facility];  Service: Thoracic;  Laterality: Right;  OB History   No obstetric history on file.      Home Medications    Prior to Admission medications   Medication Sig Start Date End Date Taking? Authorizing Provider  benzonatate (TESSALON) 200 MG capsule Take 1 capsule (200 mg total) by mouth 2 (two) times daily as needed for cough. 08/07/19   Lily Kocher, PA-C  HYDROcodone-acetaminophen (NORCO) 7.5-325 MG tablet Take 1 tablet by mouth every 6 (six) hours as needed for moderate pain. 09/18/19   Raylene Everts, MD  ibuprofen (ADVIL) 800 MG tablet Take 1 tablet (800 mg total) by mouth 3 (three) times daily. 09/18/19   Raylene Everts, MD    diphenhydrAMINE (BENADRYL) 25 MG tablet Take 1 tablet (25 mg total) by mouth once for 1 dose. 2 hours before CT scan 03/19/19 09/18/19  Curt Bears, MD    Family History Family History  Problem Relation Age of Onset  . Breast cancer Sister 34    Social History Social History   Tobacco Use  . Smoking status: Former Smoker    Packs/day: 0.75    Years: 34.00    Pack years: 25.50    Types: Cigarettes    Quit date: 08/17/2017    Years since quitting: 2.0  . Smokeless tobacco: Never Used  . Tobacco comment: 1 cigarette per week currently  Substance Use Topics  . Alcohol use: Yes    Alcohol/week: 0.0 standard drinks    Comment: 09/04/2017 "a mixed drink a few times/year"  . Drug use: No     Allergies   Iohexol and Aspirin   Review of Systems Review of Systems  Constitutional: Negative for chills and fever.  HENT: Negative for congestion and hearing loss.   Eyes: Negative for pain.  Respiratory: Positive for cough and shortness of breath.   Cardiovascular: Positive for chest pain. Negative for leg swelling.  Gastrointestinal: Negative for abdominal pain, constipation and diarrhea.  Genitourinary: Negative for dysuria and frequency.  Musculoskeletal: Negative for myalgias.  Neurological: Negative for dizziness, seizures and headaches.  Psychiatric/Behavioral: The patient is not nervous/anxious.      Physical Exam Triage Vital Signs ED Triage Vitals  Enc Vitals Group     BP 09/18/19 1141 111/74     Pulse Rate 09/18/19 1141 84     Resp 09/18/19 1141 14     Temp 09/18/19 1141 97.9 F (36.6 C)     Temp Source 09/18/19 1141 Oral     SpO2 09/18/19 1141 100 %     Weight --      Height --      Head Circumference --      Peak Flow --      Pain Score 09/18/19 1138 10     Pain Loc --      Pain Edu? --      Excl. in [place]? --    No data found.  Updated Vital Signs BP 111/74   Pulse 84   Temp 97.9 F (36.6 C) (Oral)   Resp 14   SpO2 100%     Physical  Exam Constitutional:      General: She is not in acute distress.    Appearance: She is well-developed. She is not ill-appearing.     Comments: Thin in appearance.  Appears uncomfortable  HENT:     Head: Normocephalic and atraumatic.     Right Ear: Tympanic membrane and ear canal normal.     Left Ear: Tympanic membrane and ear canal normal.  Nose: Nose normal.     Mouth/Throat:     Pharynx: No posterior oropharyngeal erythema.  Eyes:     Conjunctiva/sclera: Conjunctivae normal.     Pupils: Pupils are equal, round, and reactive to light.  Cardiovascular:     Rate and Rhythm: Normal rate and regular rhythm.     Heart sounds: Normal heart sounds.  Pulmonary:     Effort: Pulmonary effort is normal. No respiratory distress.     Breath sounds: Normal breath sounds. No rhonchi or rales.     Comments: Chest wall tenderness in the left lower ribs posteriorly and in the lower thoracic spinous processes, mild Abdominal:     General: Abdomen is flat. There is no distension.     Palpations: Abdomen is soft.  Musculoskeletal:        General: Normal range of motion.     Cervical back: Normal range of motion and neck supple.  Skin:    General: Skin is warm and dry.  Neurological:     General: No focal deficit present.     Mental Status: She is alert.  Psychiatric:        Mood and Affect: Mood normal.        Behavior: Behavior normal.      UC Treatments / Results  Labs (all labs ordered are listed, but only abnormal results are displayed) Labs Reviewed - No data to display  EKG   Radiology DG Chest 2 View  Result Date: 09/18/2019 CLINICAL DATA:  Chest pain.  History of lung cancer EXAM: CHEST - 2 VIEW COMPARISON:  August 27, 2018. FINDINGS: Normal cardiac size. Left lung is clear. Postsurgical changes are noted in the right suprahilar and apical regions. No pneumothorax or pleural effusion is noted. No acute consolidation is noted. Bony thorax is unremarkable. Mild right  basilar scarring is noted. IMPRESSION: Right-sided postoperative changes as described above. No acute cardiopulmonary abnormality seen. Electronically Signed   By: Marijo Conception M.D.   On: 09/18/2019 12:25    Procedures Procedures (including critical care time)  Medications Ordered in UC Medications - No data to display  Initial Impression / Assessment and Plan / UC Course  I have reviewed the triage vital signs and the nursing notes.  Pertinent labs & imaging results that were available during my care of the patient were reviewed by me and considered in my medical decision making (see chart for details).     I explained to the patient that although her chest x-ray is normal, I do have concern regarding the severe pain.  If sheDoes not get better pretty quickly over the next few days I want her to call her cancer provider and have her biennial CT scan moved up from January to December.  Follow-up with her primary care and oncology Final Clinical Impressions(s) / UC Diagnoses   Final diagnoses:  None     Discharge Instructions     Rest Heat to area Take ibuprofen 3 x a day with food Take hydrocodone if needed Do not drive on the pain medicine Call your doctors if the pain persists or worsens   ED Prescriptions    Medication Sig Dispense Auth. Provider   ibuprofen (ADVIL) 800 MG tablet Take 1 tablet (800 mg total) by mouth 3 (three) times daily. 21 tablet Raylene Everts, MD   HYDROcodone-acetaminophen [facility]) 7.5-325 MG tablet Take 1 tablet by mouth every 6 (six) hours as needed for moderate pain. 15 tablet Raylene Everts, MD  I have reviewed the PDMP during this encounter.   Raylene Everts, MD 09/18/19 [phone]

## 2019-09-18 NOTE — ED Triage Notes (Addendum)
Pt reports having with left sided back pain x 1 week;body aches 2 weeks;  cough x 2 years,  after right lung removal surgery. Pt denies any chest pain. Pt states when she breath she feel a sharp pain in her back, the pain woke her up last night.

## 2019-10-06 ENCOUNTER — Telehealth: Payer: Self-pay | Admitting: *Deleted

## 2019-10-06 ENCOUNTER — Other Ambulatory Visit: Payer: Self-pay | Admitting: Internal Medicine

## 2019-10-06 MED ORDER — DIPHENHYDRAMINE HCL 25 MG PO TABS: ORAL_TABLET | ORAL | 0 refills | Status: DC

## 2019-10-06 MED ORDER — PREDNISONE 50 MG PO TABS: ORAL_TABLET | ORAL | 0 refills | Status: DC

## 2019-10-06 MED FILL — predniSONE 50 MG TABS: 50 | 1 days supply | Qty: 3 | Fill #0

## 2019-10-06 NOTE — Telephone Encounter (Signed)
Notified that benadryl and prednisone sent to [facility] pharmacy

## 2019-10-06 NOTE — Telephone Encounter (Signed)
Pt left a message stating she has a CT with contrast scheduled for 10/13/19. Needs premeds as she is allergic to contrast.

## 2019-10-09 ENCOUNTER — Telehealth: Payer: Self-pay | Admitting: Internal Medicine

## 2019-10-09 NOTE — Telephone Encounter (Signed)
Scheduled appt per 1/7 sch message - left message with appt date and time

## 2019-10-10 ENCOUNTER — Other Ambulatory Visit: Payer: 59

## 2019-10-10 ENCOUNTER — Other Ambulatory Visit (HOSPITAL_COMMUNITY)
Admission: RE | Admit: 2019-10-10 | Discharge: 2019-10-10 | Disposition: A | Discharge status: Home / Self Care | Payer: 59 | Source: Ambulatory Visit | Attending: Internal Medicine | Admitting: Internal Medicine

## 2019-10-10 ENCOUNTER — Other Ambulatory Visit: Payer: Self-pay

## 2019-10-10 DIAGNOSIS — Z130 Encounter for screening for diseases of the blood and blood-forming organs and certain disorders involving the immune mechanism: Secondary | ICD-10-CM | POA: Diagnosis not present

## 2019-10-10 LAB — COMPREHENSIVE METABOLIC PANEL
ALT: 14 U/L (ref 0–44)
AST: 17 U/L (ref 15–41)
Albumin: 4.5 g/dL (ref 3.5–5.0)
Alkaline Phosphatase: 49 U/L (ref 38–126)
Anion gap: 11 (ref 5–15)
BUN: 15 mg/dL (ref 6–20)
CO2: 24 mmol/L (ref 22–32)
Calcium: 9.6 mg/dL (ref 8.9–10.3)
Chloride: 105 mmol/L (ref 98–111)
Creatinine, Ser: 0.88 mg/dL (ref 0.44–1.00)
GFR calc Af Amer: >60 mL/min (ref >60)
GFR calc non Af Amer: >60 mL/min (ref >60)
Glucose, Bld: 101 mg/dL — ABNORMAL HIGH (ref 70–99)
Potassium: 4.1 mmol/L (ref 3.5–5.1)
Sodium: 140 mmol/L (ref 135–145)
Total Bilirubin: 0.5 mg/dL (ref 0.3–1.2)
Total Protein: 6.7 g/dL (ref 6.5–8.1)

## 2019-10-10 LAB — CBC WITH DIFFERENTIAL/PLATELET
Abs Immature Granulocytes: 0.01 10*3/uL (ref 0.00–0.07)
Basophils Absolute: 0.1 10*3/uL (ref 0.0–0.1)
Basophils Relative: 1 %
Eosinophils Absolute: 0.2 10*3/uL (ref 0.0–0.5)
Eosinophils Relative: 2 %
HCT: 42 % (ref 36.0–46.0)
Hemoglobin: 13.5 g/dL (ref 12.0–15.0)
Immature Granulocytes: 0 %
Lymphocytes Relative: 26 %
Lymphs Abs: 2.1 10*3/uL (ref 0.7–4.0)
MCH: 31 pg (ref 26.0–34.0)
MCHC: 32.1 g/dL (ref 30.0–36.0)
MCV: 96.6 fL (ref 80.0–100.0)
Monocytes Absolute: 0.5 10*3/uL (ref 0.1–1.0)
Monocytes Relative: 6 %
Neutro Abs: 5.2 10*3/uL (ref 1.7–7.7)
Neutrophils Relative %: 65 %
Platelets: 135 10*3/uL — ABNORMAL LOW (ref 150–400)
RBC: 4.35 MIL/uL (ref 3.87–5.11)
RDW: 13.7 % (ref 11.5–15.5)
WBC: 8.1 10*3/uL (ref 4.0–10.5)
nRBC: 0 % (ref 0.0–0.2)

## 2019-10-13 ENCOUNTER — Encounter (HOSPITAL_COMMUNITY): Payer: Self-pay

## 2019-10-13 ENCOUNTER — Other Ambulatory Visit: Payer: Self-pay | Admitting: Internal Medicine

## 2019-10-13 ENCOUNTER — Other Ambulatory Visit: Payer: Self-pay

## 2019-10-13 ENCOUNTER — Ambulatory Visit (HOSPITAL_COMMUNITY)
Admission: RE | Admit: 2019-10-13 | Discharge: 2019-10-13 | Disposition: A | Discharge status: Home / Self Care | Payer: 59 | Source: Ambulatory Visit | Attending: Internal Medicine | Admitting: Internal Medicine

## 2019-10-13 DIAGNOSIS — C3490 Malignant neoplasm of unspecified part of unspecified bronchus or lung: Secondary | ICD-10-CM | POA: Diagnosis not present

## 2019-12-17 DIAGNOSIS — G43109 Migraine with aura, not intractable, without status migrainosus: Secondary | ICD-10-CM | POA: Diagnosis not present

## 2019-12-22 ENCOUNTER — Ambulatory Visit (INDEPENDENT_AMBULATORY_CARE_PROVIDER_SITE_OTHER): Payer: 59 | Admitting: Internal Medicine

## 2019-12-22 ENCOUNTER — Telehealth: Payer: Self-pay | Admitting: *Deleted

## 2019-12-22 VITALS — BP 115/71 | HR 71 | Temp 98.2°F | Ht 63.0 in | Wt 108.2 lb

## 2019-12-22 DIAGNOSIS — R519 Headache, unspecified: Secondary | ICD-10-CM | POA: Diagnosis not present

## 2019-12-22 DIAGNOSIS — Z859 Personal history of malignant neoplasm, unspecified: Secondary | ICD-10-CM | POA: Diagnosis not present

## 2019-12-22 DIAGNOSIS — H539 Unspecified visual disturbance: Secondary | ICD-10-CM | POA: Diagnosis not present

## 2019-12-22 NOTE — Telephone Encounter (Signed)
Message from front desk: "I have been having an eye problem in the left, I am seeing white lightening streaks constantly and blurred vision. I am also having severe headaches and muscle weakness in my arms to go numb and bad cramping in my legs and feet even walking I did go to my eye doctor on 12-17-19. He said there was no sign of a retina detachment and suggested that I see a vascular doctor for any blockages not allowing enough oxygen to my eyes and brain because of the way my vessels looked in the pictures. "  I called pt - repeated statement as above; stated unsure where to go next as far as who to call. [facility] appt schedule for today @ 7998 PM.

## 2019-12-22 NOTE — Patient Instructions (Signed)
FOLLOW-UP INSTRUCTIONS When: in 4-6 wks What to bring: All of your medications  Today we discussed your vision changes and headache.  Given what we discussed I have ordered an MRI.  Meanwhile, I recommend you treat your migraines and visual aura with ibuprofen.  You may use up to 800 mg of ibuprofen 3 times a day and Tylenol 1000 mg up to 3 times a day.  You may use them together for severe headaches.  As always if your symptoms worsen, fail to improve, or you develop other concerning symptoms, please notify our office or visit the local ER if we are unavailable. Symptoms including but not limited to visual changes lasting longer than a few moments, unrelenting headaches, weakness in any body part or loss of sensation in the body part, should not be ignored and should encourage you to visit the ED if we are unavailable by phone or the symptoms are severe.  Thank you for your visit to the [facility] today. If you have any questions or concerns please call us at [phone].

## 2019-12-22 NOTE — Progress Notes (Signed)
   CC: left eye aura  HPI:[name] is a 49 y.o. female who presents for evaluation of aura of the left eye. Please see individual problem based A/P for details.  Past Medical History:  Diagnosis Date  . Blood dyscrasia   . COPD (chronic obstructive pulmonary disease) ([place])   . Current smoker   . Difficult intubation    pt was told difficult intubation at [facility]  . 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 Headache    "weekly" (09/04/2017)  . Heart murmur   . Lung cancer ([place]) dx'd 08/2017  . Pre-diabetes   . Rapid resting heart rate    happens infrequently   Review of Systems:  ROS negative except as per HPI.  Physical Exam: Vitals:   12/22/19 1511  BP: 115/71  Pulse: 71  Temp: 98.2 F (36.8 C)  TempSrc: Oral  SpO2: 100%  Weight: 108 lb 3.2 oz (49.1 kg)  Height: 5\' 3"  (1.6 m)   Filed Weights   12/22/19 1511  Weight: 108 lb 3.2 oz (49.1 kg)   General: A/O x4, in no acute distress, afebrile, nondiaphoretic HEENT: PEERL, EMO intact Cardio: RRR, no mrg's  Pulmonary: CTA bilaterally, no wheezing or crackles  MSK: BLE nontender, nonedematous Neuro: Alert, CNII-XII grossly intact, conversational, strength 5/5 in the upper and lower extremities bilaterally, normal gait, sensation intact to fine touch, finger to nose normal Psych: Appropriate affect, not depressed in appearance, engages well  Assessment & Plan:   See Encounters Tab for problem based charting.  Patient discussed with Dr. Lynnae January

## 2019-12-23 ENCOUNTER — Encounter: Payer: Self-pay | Admitting: Internal Medicine

## 2019-12-23 DIAGNOSIS — H539 Unspecified visual disturbance: Secondary | ICD-10-CM | POA: Insufficient documentation

## 2019-12-23 NOTE — Assessment & Plan Note (Signed)
Patient presents today with visual aura or neurologic symptom followed by headaches.  There is rarely associated nausea, photosensitivity, or sensitivity to sound.  Headaches can be severe, unilateral but most often bilateral.  These are new as of the past 6 months.  They are progressively worsening.  Most of her headaches are not severe but she will times be unable to even move her head due to the severity.  Given the aura, I am concerned that these may be migraines although that they do not have the classic associated migrainous symptoms.

## 2019-12-23 NOTE — Progress Notes (Signed)
Internal Medicine Clinic Attending  Case discussed with Dr. Harbrecht at the time of the visit.  We reviewed the resident's history and exam and pertinent patient test results.  I agree with the assessment, diagnosis, and plan of care documented in the resident's note.   

## 2019-12-23 NOTE — Assessment & Plan Note (Signed)
Aura of the left eye: Patient here today for evaluation of ~3-4 wks of a "flash of white lightning" in the visual field of the left eye. This is sometimes associated with subsequently developing migraines but not 100% of the time. There is no associated muscle weakness, loss of sensation, or sinus symptoms. She has been to see her ophthalmologist and described the visit as non-diagnostic with the recommendation to follow-up with her PCP. She denoted associated pulsation in her neck but denies fever, chills, nausea, vomiting, cough, chest pain, shortness of breath, myalgias or other concerning symptoms.  She notes a personal history of lung cancer for which she still follows with oncology.  Patient has headaches approximately 5-6 times a month which began less than 6 months prior.  However headaches are extremely severe in nature and prohibitive of movement with onset.  She denies a prior history of headaches to her knowledge. Her neuro exam is unremarkable today.  I am unable to clearly visualize the fovea or retina clearly bolus visualized appears unremarkable.  Pupils equal round reactive to light. Given the patient's personal history of cancer, her age.  And 49 and intermittent nature of her symptoms feel brain imaging MRI is warranted. Given the presentation and not felt this is consistent with MS, stroke, or notable vascular pathology but there is concern for metastasis given her lung cancer history.  Most likely etiology is migraine with aura.  However, she has multiple risk factors indicating a need for brain imaging with MRI with new onset severe headaches after the age of 59 and a personal history of cancer.  Plan: MRI of the brain with/without contrast ordered Recommended treating migrainous symptoms with abortive therapy starting with NSAIDs and Tylenol Patient was given strict return precautions

## 2020-01-12 ENCOUNTER — Other Ambulatory Visit: Payer: Self-pay

## 2020-01-12 ENCOUNTER — Ambulatory Visit (HOSPITAL_COMMUNITY)
Admission: RE | Admit: 2020-01-12 | Discharge: 2020-01-12 | Disposition: A | Discharge status: Home / Self Care | Payer: 59 | Source: Ambulatory Visit | Attending: Internal Medicine | Admitting: Internal Medicine

## 2020-01-12 DIAGNOSIS — R519 Headache, unspecified: Secondary | ICD-10-CM | POA: Diagnosis not present

## 2020-01-12 DIAGNOSIS — H539 Unspecified visual disturbance: Secondary | ICD-10-CM | POA: Insufficient documentation

## 2020-01-12 MED ORDER — GADOBUTROL 1 MMOL/ML IV SOLN
4.5000 mL | Freq: Once | INTRAVENOUS | Status: AC | PRN
  Administered 2020-01-12: 4.5 mL via INTRAVENOUS

## 2020-02-02 ENCOUNTER — Encounter: Payer: Self-pay | Admitting: Internal Medicine

## 2020-02-03 ENCOUNTER — Encounter: Payer: 59 | Admitting: Internal Medicine

## 2020-02-10 ENCOUNTER — Encounter: Payer: 59 | Admitting: Internal Medicine

## 2020-02-24 ENCOUNTER — Encounter: Payer: Self-pay | Admitting: Internal Medicine

## 2020-02-24 ENCOUNTER — Ambulatory Visit: Payer: 59 | Admitting: Internal Medicine

## 2020-02-24 VITALS — BP 121/76 | HR 76 | Temp 98.1°F | Ht 63.0 in | Wt 105.7 lb

## 2020-02-24 DIAGNOSIS — F17200 Nicotine dependence, unspecified, uncomplicated: Secondary | ICD-10-CM

## 2020-02-24 DIAGNOSIS — Z0000 Encounter for general adult medical examination without abnormal findings: Secondary | ICD-10-CM | POA: Diagnosis not present

## 2020-02-24 DIAGNOSIS — G4700 Insomnia, unspecified: Secondary | ICD-10-CM | POA: Diagnosis not present

## 2020-02-24 DIAGNOSIS — R519 Headache, unspecified: Secondary | ICD-10-CM

## 2020-02-24 DIAGNOSIS — F329 Major depressive disorder, single episode, unspecified: Secondary | ICD-10-CM | POA: Diagnosis not present

## 2020-02-24 DIAGNOSIS — F339 Major depressive disorder, recurrent, unspecified: Secondary | ICD-10-CM | POA: Insufficient documentation

## 2020-02-24 DIAGNOSIS — Z902 Acquired absence of lung [part of]: Secondary | ICD-10-CM | POA: Diagnosis not present

## 2020-02-24 DIAGNOSIS — R634 Abnormal weight loss: Secondary | ICD-10-CM

## 2020-02-24 DIAGNOSIS — F17210 Nicotine dependence, cigarettes, uncomplicated: Secondary | ICD-10-CM | POA: Diagnosis not present

## 2020-02-24 DIAGNOSIS — F32A Depression, unspecified: Secondary | ICD-10-CM

## 2020-02-24 MED ORDER — TRAZODONE HCL 50 MG PO TABS: 50.0000 mg | ORAL_TABLET | Freq: Every evening | ORAL | 0 refills | Status: DC | PRN

## 2020-02-24 NOTE — Patient Instructions (Signed)
-  Was a pleasure seeing you today -Please follow-up with me in 3 months -I prescribed trazodone for your insomnia.  Please let me know if you have any issues or complications with this medication -I have also referred you to Ms. Miquel Dunn takes who is our Special educational needs teacher.  Please follow-up with her to help with her stress -Please make an appointment to see your oncologist as well as your gynecologist -Please call me with any questions or concerns or if you need any refills on medication

## 2020-02-25 ENCOUNTER — Telehealth: Payer: Self-pay | Admitting: Internal Medicine

## 2020-02-25 MED FILL — traZODone HCL 50 MG TABS: 50 | 30 days supply | Qty: 30 | Fill #0

## 2020-02-25 NOTE — Assessment & Plan Note (Signed)
-  Patient states that she quit smoking since her surgery November but every once in a while does have a cigarette secondary to stress but this is rare -Continue to encourage patient to remain abstinent from smoking

## 2020-02-25 NOTE — Telephone Encounter (Signed)
Trazodone #30 sent yesterday but following message received:  Verification status not available for this order. Rx has been called to [place] at [facility]. Patient will receive a text from Pharmacy when it is ready. Hubbard Hartshorn, BSN, RN-BC

## 2020-02-25 NOTE — Assessment & Plan Note (Signed)
-  This problem is chronic and stable -Patient's weight is now 105 pounds and she has maintained her weight in this range over the last couple months -She does complain of decreased appetite and decreased oral intake at home but attributes this to stress secondary to a number of personal issues  -We will continue to monitor her weight closely

## 2020-02-25 NOTE — Assessment & Plan Note (Addendum)
-  Patient received both shots of her Covid vaccine

## 2020-02-25 NOTE — Telephone Encounter (Signed)
Pt seen yesterday 02/24/2020 and states she has not rec'd her refill for the following.   traZODone (DESYREL) 50 MG tablet  [facility] - [place], [place] - [id number].

## 2020-02-25 NOTE — Assessment & Plan Note (Signed)
-  Patient has a history of stage Ia adenocarcinoma of her lung status post right upper lobectomy in November 2018 -She is on blood work and a repeat CT scan of her chest done in January this year -CT of the chest shows no evidence of recurrent cancer -Blood work done at the time was within normal limits -Patient was supposed to have an appointment with oncology in January but states that she has not been called for this -I encouraged the patient to call Dr. Lew Dawes office for follow-up  -No further work-up at this time

## 2020-02-25 NOTE — Assessment & Plan Note (Signed)
-  Patient still with intermittent headaches which I suspect is secondary to her lack of sleep and ongoing personal stressors -She had an MRI of her brain done which showed no acute pathology -Patient was given trazodone to help with her insomnia and was referred to integrated behavioral health to help her with coping mechanisms for stress -I suspect that her headache will improve with better sleep as well as improvement in her stress. -We will follow-up at her next visit

## 2020-02-25 NOTE — Progress Notes (Signed)
   Subjective:    Patient ID: Heather [name], female    DOB: Apr 22, 1971, 49 y.o.   MRN: [id number]  HPI  I have seen and examined this patient.  Patient is here for routine health follow-up as well as follow-up for recent headache and decreased appetite.  Patient states that she feels stressed out and has been unable to sleep well and has been having recurrent headaches as well as decreased appetite and decreased oral intake over the last couple of weeks.  She denies any other complaints at this time and states that she feels well otherwise.   Review of Systems  Constitutional: Negative.   HENT: Negative.   Respiratory: Negative.   Cardiovascular: Negative.   Gastrointestinal: Negative.   Musculoskeletal: Negative.   Neurological: Positive for headaches.  Psychiatric/Behavioral:       Patient complains of being stressed out at home       Objective:   Physical Exam Constitutional:      Appearance: Normal appearance.  HENT:     Head: Normocephalic and atraumatic.  Cardiovascular:     Rate and Rhythm: Normal rate and regular rhythm.     Heart sounds: Normal heart sounds.  Pulmonary:     Effort: Pulmonary effort is normal.     Breath sounds: Normal breath sounds. No wheezing or rales.  Abdominal:     General: Bowel sounds are normal. There is no distension.     Palpations: Abdomen is soft.     Tenderness: There is no abdominal tenderness.  Musculoskeletal:        General: No swelling or tenderness.  Neurological:     Mental Status: She is alert and oriented to person, place, and time.  Psychiatric:        Mood and Affect: Mood normal.        Behavior: Behavior normal.           Assessment & Plan:  Please see problem based charting for assessment and plan:

## 2020-02-25 NOTE — Assessment & Plan Note (Signed)
-  Patient states that she is sleeping only 3 to 4 hours every night and that she is tossing and turning and that her mind is constantly racing -She attributes this to stress secondary to personal issues  -Patient has tried over-the-counter sleeping aids such as melatonin and Advil PM with no success -I believe that the patient would benefit from a short-term sleep aid and will start the patient on trazodone to see if this will help -Patient started to call me if she has any issues with this medication

## 2020-02-25 NOTE — Assessment & Plan Note (Signed)
-  Patient states that she has been stressed out at home and has been having decreased sleep, decreased appetite and headaches secondary to this -Her PHQ nine score today is 12 -I believe that the patient would benefit from ambulatory referral to integrated behavioral health (Ms. Dessie Coma) to help her with coping mechanisms for her stress as well as referral to psychiatry if needed -Patient is also started on trazodone to help with her insomnia which may benefit her depression as well

## 2020-02-26 NOTE — Telephone Encounter (Signed)
Thank you! Is there anything else I need to do?

## 2020-02-26 NOTE — Telephone Encounter (Signed)
No further action needed. Thank you.

## 2020-03-17 ENCOUNTER — Encounter: Payer: Self-pay | Admitting: Licensed Clinical Social Worker

## 2020-03-17 ENCOUNTER — Telehealth: Payer: Self-pay | Admitting: Licensed Clinical Social Worker

## 2020-03-17 NOTE — Telephone Encounter (Signed)
Patient was called twice to discuss a referral for services. I was unable to leave a vm both times.  A letter was mailed to let the patient know about the referral from her doctor.

## 2020-03-23 ENCOUNTER — Telehealth: Payer: Self-pay | Admitting: *Deleted

## 2020-03-23 NOTE — Telephone Encounter (Signed)
I received a call from patient. She was not scheduled for a follow up appt with Dr. Julien Nordmann in 6 months.  She also states she is not feeling well. I called her but was unable to reach. I did leave her a message to call me with my name and phone number.

## 2020-03-24 ENCOUNTER — Telehealth: Payer: Self-pay | Admitting: *Deleted

## 2020-03-24 ENCOUNTER — Encounter: Payer: Self-pay | Admitting: *Deleted

## 2020-03-24 NOTE — Telephone Encounter (Signed)
I received a call from [name].  She says she is getting short of breath and went to see her PCP.  They did not offer any suggestions to help her. I spoke with Dr. Julien Nordmann and he states for her to see symptom management for help.  I updated [name] of his suggestion.  I asked that she call the office and ask to symptom management team.

## 2020-03-24 NOTE — Telephone Encounter (Signed)
Received call from patient requesting to be seen in [facility].  RN returned call and spoke with patient about increasing SOB and cough with mild exertion.  Patient agreed to be seen on 03/25/2020 at 1000 -- scheduling message sent.

## 2020-03-25 ENCOUNTER — Emergency Department (HOSPITAL_COMMUNITY): Payer: 59

## 2020-03-25 ENCOUNTER — Encounter (HOSPITAL_COMMUNITY): Payer: Self-pay

## 2020-03-25 ENCOUNTER — Inpatient Hospital Stay: Payer: 59 | Attending: Medical | Admitting: Medical

## 2020-03-25 ENCOUNTER — Inpatient Hospital Stay: Payer: 59

## 2020-03-25 ENCOUNTER — Other Ambulatory Visit: Payer: Self-pay

## 2020-03-25 ENCOUNTER — Emergency Department (HOSPITAL_COMMUNITY)
Admission: EM | Admit: 2020-03-25 | Discharge: 2020-03-25 | Disposition: A | Discharge status: Home / Self Care | Payer: 59 | Attending: Emergency Medicine | Admitting: Emergency Medicine

## 2020-03-25 VITALS — BP 120/89 | HR 87 | Temp 97.7°F | Resp 17 | Ht 63.0 in | Wt 102.2 lb

## 2020-03-25 DIAGNOSIS — J449 Chronic obstructive pulmonary disease, unspecified: Secondary | ICD-10-CM | POA: Insufficient documentation

## 2020-03-25 DIAGNOSIS — Z886 Allergy status to analgesic agent status: Secondary | ICD-10-CM | POA: Insufficient documentation

## 2020-03-25 DIAGNOSIS — R0902 Hypoxemia: Secondary | ICD-10-CM | POA: Diagnosis not present

## 2020-03-25 DIAGNOSIS — Z20822 Contact with and (suspected) exposure to covid-19: Secondary | ICD-10-CM | POA: Diagnosis not present

## 2020-03-25 DIAGNOSIS — R0602 Shortness of breath: Secondary | ICD-10-CM | POA: Diagnosis not present

## 2020-03-25 DIAGNOSIS — R079 Chest pain, unspecified: Secondary | ICD-10-CM | POA: Diagnosis present

## 2020-03-25 DIAGNOSIS — C3490 Malignant neoplasm of unspecified part of unspecified bronchus or lung: Secondary | ICD-10-CM

## 2020-03-25 DIAGNOSIS — Z87891 Personal history of nicotine dependence: Secondary | ICD-10-CM | POA: Diagnosis not present

## 2020-03-25 LAB — CMP (CANCER CENTER ONLY)
ALT: 11 U/L (ref 0–44)
AST: 13 U/L — ABNORMAL LOW (ref 15–41)
Albumin: 4.7 g/dL (ref 3.5–5.0)
Alkaline Phosphatase: 63 U/L (ref 38–126)
Anion gap: 11 (ref 5–15)
BUN: 8 mg/dL (ref 6–20)
CO2: 23 mmol/L (ref 22–32)
Calcium: 9.7 mg/dL (ref 8.9–10.3)
Chloride: 103 mmol/L (ref 98–111)
Creatinine: 0.82 mg/dL (ref 0.44–1.00)
GFR, Est AFR Am: >60 mL/min (ref >60)
GFR, Estimated: >60 mL/min (ref >60)
Glucose, Bld: 113 mg/dL — ABNORMAL HIGH (ref 70–99)
Potassium: 4.2 mmol/L (ref 3.5–5.1)
Sodium: 137 mmol/L (ref 135–145)
Total Bilirubin: 0.7 mg/dL (ref 0.3–1.2)
Total Protein: 7.6 g/dL (ref 6.5–8.1)

## 2020-03-25 LAB — BASIC METABOLIC PANEL
Anion gap: 11 (ref 5–15)
BUN: 9 mg/dL (ref 6–20)
CO2: 25 mmol/L (ref 22–32)
Calcium: 9.6 mg/dL (ref 8.9–10.3)
Chloride: 103 mmol/L (ref 98–111)
Creatinine, Ser: 0.73 mg/dL (ref 0.44–1.00)
GFR calc Af Amer: >60 mL/min (ref >60)
GFR calc non Af Amer: >60 mL/min (ref >60)
Glucose, Bld: 93 mg/dL (ref 70–99)
Potassium: 4 mmol/L (ref 3.5–5.1)
Sodium: 139 mmol/L (ref 135–145)

## 2020-03-25 LAB — CBC WITH DIFFERENTIAL/PLATELET
Abs Immature Granulocytes: 0.02 10*3/uL (ref 0.00–0.07)
Abs Immature Granulocytes: 0.02 10*3/uL (ref 0.00–0.07)
Basophils Absolute: 0.1 10*3/uL (ref 0.0–0.1)
Basophils Absolute: 0.1 10*3/uL (ref 0.0–0.1)
Basophils Relative: 1 %
Basophils Relative: 1 %
Eosinophils Absolute: 0.1 10*3/uL (ref 0.0–0.5)
Eosinophils Absolute: 0.1 10*3/uL (ref 0.0–0.5)
Eosinophils Relative: 2 %
Eosinophils Relative: 2 %
HCT: 44.5 % (ref 36.0–46.0)
HCT: 41.5 % (ref 36.0–46.0)
Hemoglobin: 14.7 g/dL (ref 12.0–15.0)
Hemoglobin: 13.8 g/dL (ref 12.0–15.0)
Immature Granulocytes: 0 %
Immature Granulocytes: 0 %
Lymphocytes Relative: 18 %
Lymphocytes Relative: 22 %
Lymphs Abs: 1.4 10*3/uL (ref 0.7–4.0)
Lymphs Abs: 1.6 10*3/uL (ref 0.7–4.0)
MCH: 31.4 pg (ref 26.0–34.0)
MCH: 30.9 pg (ref 26.0–34.0)
MCHC: 33 g/dL (ref 30.0–36.0)
MCHC: 33.3 g/dL (ref 30.0–36.0)
MCV: 95.1 fL (ref 80.0–100.0)
MCV: 93 fL (ref 80.0–100.0)
Monocytes Absolute: 0.4 10*3/uL (ref 0.1–1.0)
Monocytes Absolute: 0.4 10*3/uL (ref 0.1–1.0)
Monocytes Relative: 5 %
Monocytes Relative: 5 %
Neutro Abs: 5.9 10*3/uL (ref 1.7–7.7)
Neutro Abs: 4.9 10*3/uL (ref 1.7–7.7)
Neutrophils Relative %: 74 %
Neutrophils Relative %: 70 %
Platelets: 147 10*3/uL — ABNORMAL LOW (ref 150–400)
Platelets: 133 10*3/uL — ABNORMAL LOW (ref 150–400)
RBC: 4.68 MIL/uL (ref 3.87–5.11)
RBC: 4.46 MIL/uL (ref 3.87–5.11)
RDW: 13.3 % (ref 11.5–15.5)
RDW: 13.3 % (ref 11.5–15.5)
WBC: 7.9 10*3/uL (ref 4.0–10.5)
WBC: 7.1 10*3/uL (ref 4.0–10.5)
nRBC: 0 % (ref 0.0–0.2)
nRBC: 0 % (ref 0.0–0.2)

## 2020-03-25 LAB — BRAIN NATRIURETIC PEPTIDE: B Natriuretic Peptide: 29.1 pg/mL (ref 0.0–100.0)

## 2020-03-25 LAB — TROPONIN I (HIGH SENSITIVITY)
Troponin I (High Sensitivity): <2 ng/L (ref <18)
Troponin I (High Sensitivity): <2 ng/L (ref <18)

## 2020-03-25 LAB — SARS CORONAVIRUS 2 BY RT PCR (HOSPITAL ORDER, PERFORMED IN ~~LOC~~ HOSPITAL LAB): SARS Coronavirus 2: NEGATIVE

## 2020-03-25 MED ORDER — DIPHENHYDRAMINE HCL 50 MG/ML IJ SOLN: 25.0000 mg | Freq: Once | INTRAMUSCULAR | Status: DC

## 2020-03-25 MED ORDER — IOHEXOL 350 MG/ML SOLN
100.0000 mL | Freq: Once | INTRAVENOUS | Status: AC | PRN
  Administered 2020-03-25: 75 mL via INTRAVENOUS

## 2020-03-25 MED ORDER — DIPHENHYDRAMINE HCL 50 MG/ML IJ SOLN
50.0000 mg | Freq: Once | INTRAMUSCULAR | Status: AC
  Administered 2020-03-25: 50 mg via INTRAVENOUS
  Filled 2020-03-25: qty 1

## 2020-03-25 MED ORDER — DIPHENHYDRAMINE HCL 50 MG/ML IJ SOLN: 50.0000 mg | Freq: Once | INTRAMUSCULAR | Status: DC

## 2020-03-25 MED ORDER — EPINEPHRINE 0.3 MG/0.3ML IJ SOAJ
INTRAMUSCULAR | Status: AC
  Filled 2020-03-25: qty 0.3

## 2020-03-25 MED ORDER — DIPHENHYDRAMINE HCL 25 MG PO CAPS
25.0000 mg | ORAL_CAPSULE | Freq: Once | ORAL | Status: AC
  Administered 2020-03-25: 25 mg via ORAL
  Filled 2020-03-25: qty 1

## 2020-03-25 MED ORDER — DIPHENHYDRAMINE HCL 25 MG PO CAPS: 50.0000 mg | ORAL_CAPSULE | Freq: Once | ORAL | Status: DC

## 2020-03-25 MED ORDER — HYDROCORTISONE NA SUCCINATE PF 250 MG IJ SOLR
200.0000 mg | Freq: Once | INTRAMUSCULAR | Status: AC
  Administered 2020-03-25: 200 mg via INTRAVENOUS
  Filled 2020-03-25: qty 200

## 2020-03-25 NOTE — ED Provider Notes (Signed)
[facility] DEPT Provider Note   CSN: [id number] Arrival date & time: 03/25/20  1152     History Chief Complaint  Patient presents with  . Shortness of Breath    Heather [name] is a 49 y.o. female past med history of COPD, factor V leiden deficiency, lung cancer with previous right lobectomy, pulmonary nodule who presents for evaluation of shortness of breath. She says she has had some mild shortness of breath over last few months. She states that over the last 3 days, it is gotten significantly worse. She reports that 2 days ago, she was walking from her car to work and states that she could not even make it in the entire distance because she became so short of breath. She states she will have some intermittent occurring chest pain that happens when she has these episodes of shortness of breath. She states it does not last long. She does report that when she takes a deep breath in, it makes her chest pain worse. She feels like "her chest is going to explode." She has had cough but states that that is chronic for her. No changes. Denies any fevers. She states she has a history of lung cancer. She had a lobectomy. She reports that she follows every 6 months with oncology and states that her last scans in January 2021 were good. She is not currently in treatment. She does have history of factor V Leiden. She has never had a blood clot before. She does occasionally smoke. She denies any abdominal pain, vomiting, asymmetric swelling, warmth, redness of her legs.  The history is provided by the patient.       Past Medical History:  Diagnosis Date  . Blood dyscrasia   . COPD (chronic obstructive pulmonary disease) ([place])   . Current smoker   . Difficult intubation    pt was told difficult intubation at [facility]  . Factor V deficiency ([place])    protein deficiency  . Headache    "weekly" (09/04/2017)  . Heart murmur   . Hypokalemia 10/15/2017  . Lung cancer  ([place]) dx'd 08/2017  . Pleuritic chest pain 06/13/2018  . Pre-diabetes   . Pulmonary nodule 07/16/2017  . Rapid resting heart rate    happens infrequently  . Rib pain on right side 12/21/2017    Patient Active Problem List   Diagnosis Date Noted  . Insomnia 02/24/2020  . Depression 02/24/2020  . Visual aura 12/23/2019  . S/P lobectomy of lung 08/20/2017  . Cervical adenopathy 07/04/2017  . Factor V Leiden ([place]) 04/13/2015  . Prediabetes 04/13/2015  . History of colon polyps 04/13/2015  . Nonintractable headache 11/24/2014  . H/O drug rash due to contrast  05/14/2014  . Loss of weight 04/02/2014  . Cough 04/02/2014  . Current smoker 04/02/2014  . Routine health maintenance 04/02/2014  . History of anemia 05/08/2007    Past Surgical History:  Procedure Laterality Date  . CESAREAN SECTION  1992; 2012  . COLONOSCOPY N/A 05/15/2014   Procedure: COLONOSCOPY;  Surgeon: Lafayette Dragon, MD;  Location: [facility];  Service: Endoscopy;  Laterality: N/A;  . ESOPHAGOGASTRODUODENOSCOPY N/A 05/15/2014   Procedure: ESOPHAGOGASTRODUODENOSCOPY (EGD);  Surgeon: Lafayette Dragon, MD;  Location: [facility];  Service: Endoscopy;  Laterality: N/A;  . LAPAROSCOPY  07/07/2011   Procedure: LAPAROSCOPY DIAGNOSTIC;  Surgeon: Elveria Royals;  Location: [facility];  Service: Gynecology;  Laterality: N/A;  . LESION REMOVAL  07/07/2011   Procedure: EXCISION VAGINAL  LESION;  Surgeon: Elveria Royals;  Location: [facility];  Service: Gynecology;  Laterality: N/A;  . LOBECTOMY Right 08/20/2017   Procedure: RIGHT UPPER LOBE LOBECTOMY;  Surgeon: [name], MD;  Location: [facility];  Service: Thoracic;  Laterality: Right;  . LYMPH NODE DISSECTION N/A 08/20/2017   Procedure: LYMPH NODE DISSECTION;  Surgeon: [name], MD;  Location: [facility];  Service: Thoracic;  Laterality: N/A;  . VAGINAL HYSTERECTOMY  ~ 2014  . VIDEO ASSISTED THORACOSCOPY (VATS)/WEDGE RESECTION Right 08/20/2017   Procedure: RIGHT VIDEO  ASSISTED THORACOSCOPY (VATS)/WEDGE RESECTION;  Surgeon: [name], MD;  Location: [facility];  Service: Thoracic;  Laterality: Right;     OB History   No obstetric history on file.     Family History  Problem Relation Age of Onset  . Breast cancer Sister 65    Social History   Tobacco Use  . Smoking status: Former Smoker    Packs/day: 0.75    Years: 34.00    Pack years: 25.50    Types: Cigarettes    Quit date: 08/17/2017    Years since quitting: 2.6  . Smokeless tobacco: Never Used  . Tobacco comment: 1 cigarette per week currently  Vaping Use  . Vaping Use: Never used  Substance Use Topics  . Alcohol use: Yes    Alcohol/week: 0.0 standard drinks    Comment: 09/04/2017 "a mixed drink a few times/year"  . Drug use: No    Home Medications Prior to Admission medications   Medication Sig Start Date End Date Taking? Authorizing Provider  ibuprofen (ADVIL) 200 MG tablet Take 200 mg by mouth every 6 (six) hours as needed for headache or moderate pain.   Yes [provider]  traZODone (DESYREL) 50 MG tablet Take 1 tablet (50 mg total) by mouth at bedtime as needed for sleep. 02/24/20  Yes Aldine Contes, MD    Allergies    Iohexol and Aspirin  Review of Systems   Review of Systems  Constitutional: Negative for fever.  Respiratory: Positive for cough and shortness of breath.   Cardiovascular: Positive for chest pain. Negative for leg swelling.  Gastrointestinal: Negative for abdominal pain, nausea and vomiting.  Genitourinary: Negative for dysuria and hematuria.  Neurological: Negative for headaches.  All other systems reviewed and are negative.   Physical Exam Updated Vital Signs BP 118/78   Pulse 66   Temp 97.8 F (36.6 C) (Oral)   Resp 20   SpO2 100%   Physical Exam Vitals and nursing note reviewed.  Constitutional:      Appearance: Normal appearance. She is well-developed.  HENT:     Head: Normocephalic and atraumatic.  Eyes:      General: Lids are normal.     Conjunctiva/sclera: Conjunctivae normal.     Pupils: Pupils are equal, round, and reactive to light.  Cardiovascular:     Rate and Rhythm: Normal rate and regular rhythm.     Pulses: Normal pulses.          Radial pulses are 2+ on the right side and 2+ on the left side.       Dorsalis pedis pulses are 2+ on the right side and 2+ on the left side.     Heart sounds: Normal heart sounds. No murmur heard.  No friction rub. No gallop.   Pulmonary:     Effort: Pulmonary effort is normal.     Breath sounds: Normal breath sounds.     Comments: Lungs  clear to auscultation bilaterally.  Symmetric chest rise.  No wheezing, rales, rhonchi. Abdominal:     Palpations: Abdomen is soft. Abdomen is not rigid.     Tenderness: There is no abdominal tenderness. There is no guarding.  Musculoskeletal:        General: Normal range of motion.     Cervical back: Full passive range of motion without pain.     Comments: Bilateral lower extremity is without any overlying warmth, erythema, edema.  Skin:    General: Skin is warm and dry.     Capillary Refill: Capillary refill takes less than 2 seconds.  Neurological:     Mental Status: She is alert and oriented to person, place, and time.  Psychiatric:        Speech: Speech normal.     ED Results / Procedures / Treatments   Labs (all labs ordered are listed, but only abnormal results are displayed) Labs Reviewed  CBC WITH DIFFERENTIAL/PLATELET - Abnormal; Notable for the following components:      Result Value   Platelets 133 (*)    All other components within normal limits  SARS CORONAVIRUS 2 BY RT PCR (HOSPITAL ORDER, [facility])  BASIC METABOLIC PANEL  BRAIN NATRIURETIC PEPTIDE  TROPONIN I (HIGH SENSITIVITY)  TROPONIN I (HIGH SENSITIVITY)    EKG EKG Interpretation  Date/Time:  Thursday March 25 2020 12:02:45 EDT Ventricular Rate:  68 PR Interval:    QRS Duration: 73 QT  Interval:  405 QTC Calculation: 431 R Axis:   83 Text Interpretation: Sinus rhythm ST elev, probable normal early repol pattern No significant change since last tracing Confirmed by Theotis Burrow [phone]) on 03/25/2020 12:39:16 PM   Radiology CT Angio Chest PE W and/or Wo Contrast  Result Date: 03/25/2020 CLINICAL DATA:  Shortness of breath. EXAM: CT ANGIOGRAPHY CHEST WITH CONTRAST TECHNIQUE: Multidetector CT imaging of the chest was performed using the standard protocol during bolus administration of intravenous contrast. Multiplanar CT image reconstructions and MIPs were obtained to evaluate the vascular anatomy. CONTRAST:  58mL OMNIPAQUE IOHEXOL 350 MG/ML SOLN COMPARISON:  CT chest October 13, 2019 FINDINGS: Cardiovascular: Heart size is normal. No pericardial effusion. No signs of pulmonary embolism. Post treatment changes along the RIGHT mediastinum distort pulmonary arterial anatomy. Aortic caliber is normal. Aorta is not well opacified. Scattered atheromatous plaque in the thoracic aorta. Mediastinum/Nodes: Post treatment changes along the RIGHT mediastinal border similar to the prior study. No adenopathy in the mediastinum, thoracic inlet or axillary regions. Lungs/Pleura: Post treatment changes along the RIGHT mediastinal border. Scarring at the RIGHT lung apex. Also with LEFT apical scarring and background pulmonary emphysema airways are patent. No consolidation. No pleural effusion. Operative findings of upper lobectomy and signs of wedge resection as before. Upper Abdomen: Incidental imaging of upper abdominal contents without acute process. Musculoskeletal: No acute musculoskeletal process. Spinal degenerative changes. Review of the MIP images confirms the above findings. IMPRESSION: 1. No signs of pulmonary embolism. 2. Post treatment changes along the RIGHT mediastinal border and RIGHT lung apex similar to the prior study with postoperative changes as before. 3. Emphysema and aortic  atherosclerosis. Aortic Atherosclerosis (ICD10-I70.0) and Emphysema (ICD10-J43.9). Electronically Signed   By: Zetta Bills M.D.   On: 03/25/2020 17:36   DG Chest Portable 1 View  Result Date: 03/25/2020 CLINICAL DATA:  Shortness of breath. EXAM: PORTABLE CHEST 1 VIEW COMPARISON:  09/18/2019. FINDINGS: Mediastinum is normal. Stable postsurgical changes right upper lung with right upper lung pleural-parenchymal  thickening and retraction consistent scarring. Left apical pleural thickening again noted consistent scarring. No acute infiltrate. Mild basilar pleural thickening noted most consistent scarring. No prominent pleural effusion. No pneumothorax. No acute bony abnormality. IMPRESSION: Stable postsurgical changes right upper lung with right upper lung pleural-parenchymal scarring. Stable mild left apical and bibasilar pleural thickening consistent scarring. No acute pulmonary disease. Electronically Signed   By: Marcello Moores  Register   On: 03/25/2020 13:16    Procedures Procedures (including critical care time)  Medications Ordered in ED Medications  EPINEPHrine (EPI-PEN) 0.3 mg/0.3 mL injection (has no administration in time range)  hydrocortisone sodium succinate (SOLU-CORTEF) injection 200 mg (200 mg Intravenous Given 03/25/20 1305)  diphenhydrAMINE (BENADRYL) capsule 25 mg (25 mg Oral Given 03/25/20 1304)  diphenhydrAMINE (BENADRYL) injection 50 mg (50 mg Intravenous Given 03/25/20 1658)  iohexol (OMNIPAQUE) 350 MG/ML injection 100 mL (75 mLs Intravenous Contrast Given 03/25/20 1716)    ED Course  I have reviewed the triage vital signs and the nursing notes.  Pertinent labs & imaging results that were available during my care of the patient were reviewed by me and considered in my medical decision making (see chart for details).    MDM Rules/Calculators/A&P                          49 year old female with past medical 3 factor V Leiden, lung cancer with lobectomy who presents for  evaluation of shortness of breath. Feels like it worsened in the last few days. Followed up with her cancer center for evaluation. She was noted to be hypoxic with O2 sats at 85%. She was sent to the ED for further evaluation. She states she has had some pleuritic chest pain. Has not noted any overlying warmth, erythema, edema of her legs. No fevers. On initially arrival, she is afebrile, nontoxic-appearing. Vital signs are stable. On exam, no evidence of respiratory distress. She speaking full sentences 90 difficulty. No obvious evidence of DVT. Given her history, concern for PE as possibility. Also consider infectious though she has not had any fever. Lower suspicion for ACS etiology as it does not sound typical. We will plan for labs, chest x-ray.  I reviewed patient's record. She has a history of allergy to contrast dye. I discussed with patient regarding premedication versus scans without contrast. She states that when she has premedication, she really only has a rash and otherwise does not have any other symptoms. She states she has had this before when they have done scans for her cancer. She is agreeable to do premedication and contrast imaging.  BNP is normal.  BMP is without any abnormalities.  CBC shows no leukocytosis.  Troponin is negative.  Covid is negative.  Chest x-ray shows stable postsurgical changes in the right upper lung.  No acute pulmonary disease.   I discussed with Dr. Zigmund Daniel (radiology) regarding patient's ability to get CTA for evaluation of PE.  Given her previous lobectomy and lung disease, she is a poor candidate for VQ scan.  I discussed with patient and she states that she has had contrast before while being premedicated so that they could evaluate for cancer scans.  At this time, we will plan to proceed with CTA.  I discussed with patient she understands that there is a risk given that she normally does a 24-hour premedication and this is a 4.  She understands and asked  presses wishes to continue and proceed with imaging.  Dr. Rex Kras made  aware.  2nd troponin negative.  Patient ambulated in the ED while maintaining O2 sats between 96-98%.  She did report feeling slightly more short of breath.  CTA of chest shows no signs of pulmonary embolism.  She has posttreatment changes along the right mediastinal border and right lung apex similar to prior study with postoperative changes.  There is some emphysema and aortic arthrosclerosis noted.  Patient has been observed after CT scan.  No evidence of rash, shortness of breath or swelling.  Repeat lung exam shows no evidence of wheezing.  Patient is hemodynamically stable.  I discussed with patient regarding her work-up.  At this time, unclear etiology of her shortness of breath.  At this time, her work-up in the emergency department looks reassuring.  She has maintained good O2 sats throughout her duration here in the ED.  I discussed with her that this could be related to airway disease although I do not hear any wheezing today.  She has never seen a pulmonologist.  We will plan to refer her to pulmonology. At this time, patient exhibits no emergent life-threatening condition that require further evaluation in ED or admission. Patient had ample opportunity for questions and discussion. All patient's questions were answered with full understanding. Strict return precautions discussed. Patient expresses understanding and agreement to plan.   Portions of this note were generated with [employer]. Dictation errors may occur despite best attempts at proofreading.  Final Clinical Impression(s) / ED Diagnoses Final diagnoses:  SOB (shortness of breath)    Rx / DC Orders ED Discharge Orders    None       Desma Mcgregor 03/25/20 1846    Little, Wenda Overland, MD 03/26/20 [phone]

## 2020-03-25 NOTE — ED Notes (Signed)
Pt ambulated with pulse oximetry around room. Pt SpO2 stayed around 96-98% on RA. Pt reports feeling more SHOB upon walking.

## 2020-03-25 NOTE — ED Notes (Signed)
Patient is being brought over for PE work up-patient has an allergy to contrast

## 2020-03-25 NOTE — Discharge Instructions (Signed)
To workAs we discussed, your work-up today looks very reassuring.  Your CT scan did not show any evidence of blood clot.  Was reassuring.  As we discussed, I have referred you to the pulmonologist.  Please call their office and arrange for an appointment.  Return to emergency department for any worsening difficulty breathing, chest pain or any other worsening concerning symptoms.

## 2020-03-25 NOTE — ED Triage Notes (Signed)
Pt brought over from cancer center. Pt reports being progressively SHOB over the last few days. Upon walking at cancer center her SpO2 was 85%. SpO2 normal range upon sitting on RA. Hx of stage 1 right lung cancer and lobectomy last year. Pt is currently not on chemo or getting radiation. According to cancer center PA, pt is at high risk for developing a blood clot and was sent here to rule that out. Pt is allergic to contrast.

## 2020-03-25 NOTE — Progress Notes (Addendum)
Symptoms Management Clinic Progress Note   Heather [name] [id number] 09-13-1971 49 y.o.  Lucill Mauck is managed by Dr. Julien Nordmann.  Actively treated with chemotherapy/immunotherapy/hormonal therapy: No, status post a right upper and middle lobectomy in November 2018.  Next scheduled appointment with provider: No upcoming appointments.   Assessment: Plan:    Hypoxia   Hypoxia and shortness of Breath: Patient's pulse ox dropped to 85% when ambulating in the clinic and she was sent to the Emergency Department to get a CT angiogram to rule out a PE. She is allergic to contrast, so she was sent to the ED to be able to start premedications or go about another test to rule out a PE.  Oxygen Saturation:  Oxygen saturation on room air at rest:    100% % Oxygen saturation on room air while ambulating:  85% Oxygen saturation on 2 L of oxygen while ambulating:  98%  Patient is unable to increase her oxygen saturation by other means.   Please see After Visit Summary for patient specific instructions.  No future appointments.  Orders Placed This Encounter  Procedures  . CBC with Differential       Subjective:   Patient ID:  Heather [name] is a 49 y.o. (DOB 06-16-1971) female.  Chief Complaint: No chief complaint on file.   HPI Heather [name] is a 49 year old female patient of Dr. Julien Nordmann. She had a right upper and middle lobectomy in November 2018. Her last CT scan on 10/13/2019 showed unchanged postoperative findings of right upper and right middle lobectomy and no evidence of recurrent metastatic disease in the chest. She presents to the symptom management clinic today for increasing shortness of breath. She states she is unable to talk on the phone all day at work without becoming very short of breath. She also states she is unable to walk from her car in the parking into the building each day without becoming short of breath. She endorses a cough that is dry as well as  wet, with some green sputum occasionally. She also endorses some mid left back pain that is constant during the day, and states it becomes worse in the evening. It is a stabbing pain not relieved by tylenol or ibuprofen. She states that she is proud of herself for cutting back on smoking, and she only has an occasional cigarette when she becomes very stressed. She denies having any appetite and has lost 6 pounds in the past 3 months. She also endorses trouble sleeping at night and states her prescription of trazodone has allowed her to sleep 4-5 hours a night, which is an improvement.   Oxygen Saturation:  Oxygen saturation on room air at rest:    100% % Oxygen saturation on room air while ambulating:  85% Oxygen saturation on 2 L of oxygen while ambulating:  98%  Patient is unable to increase her oxygen saturation by other means.   Medications: I have reviewed the patient's current medications.  Allergies:  Allergies  Allergen Reactions  . Iohexol Shortness Of Breath and Swelling    Patient indicates she had swelling all over her body as well as shortness of breath.  Even when she was premedicated, she broke out in a full body rash.  . Aspirin Nausea And Vomiting    Past Medical History:  Diagnosis Date  . Blood dyscrasia   . COPD (chronic obstructive pulmonary disease) ([place])   . Current smoker   . Difficult intubation  pt was told [facility]  . 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 Headache    "weekly" (09/04/2017)  . Heart murmur   . Hypokalemia 10/15/2017  . Lung cancer ([place]) dx'd 08/2017  . Pleuritic chest pain 06/13/2018  . Pre-diabetes   . Pulmonary nodule 07/16/2017  . Rapid resting heart rate    happens infrequently  . Rib pain on right side 12/21/2017    Past Surgical History:  Procedure Laterality Date  . CESAREAN SECTION  1992; 2012  . COLONOSCOPY N/A 05/15/2014   Procedure: COLONOSCOPY;  Surgeon: Lafayette Dragon, MD;   Location: [facility];  Service: Endoscopy;  Laterality: N/A;  . ESOPHAGOGASTRODUODENOSCOPY N/A 05/15/2014   Procedure: ESOPHAGOGASTRODUODENOSCOPY (EGD);  Surgeon: Lafayette Dragon, MD;  Location: [facility];  Service: Endoscopy;  Laterality: N/A;  . LAPAROSCOPY  07/07/2011   Procedure: LAPAROSCOPY DIAGNOSTIC;  Surgeon: Elveria Royals;  Location: [facility];  Service: Gynecology;  Laterality: N/A;  . LESION REMOVAL  07/07/2011   Procedure: EXCISION VAGINAL LESION;  Surgeon: Elveria Royals;  Location: [facility];  Service: Gynecology;  Laterality: N/A;  . LOBECTOMY Right 08/20/2017   Procedure: RIGHT UPPER LOBE LOBECTOMY;  Surgeon: [name], MD;  Location: [place];  Service: Thoracic;  Laterality: Right;  . LYMPH NODE DISSECTION N/A 08/20/2017   Procedure: LYMPH NODE DISSECTION;  Surgeon: [name], MD;  Location: [place];  Service: Thoracic;  Laterality: N/A;  . VAGINAL HYSTERECTOMY  ~ 2014  . VIDEO ASSISTED THORACOSCOPY (VATS)/WEDGE RESECTION Right 08/20/2017   Procedure: RIGHT VIDEO ASSISTED THORACOSCOPY (VATS)/WEDGE RESECTION;  Surgeon: [name], MD;  Location: [facility];  Service: Thoracic;  Laterality: Right;    Family History  Problem Relation Age of Onset  . Breast cancer Sister 69    Social History   Socioeconomic History  . Marital status: Divorced    Spouse name: Not on file  . Number of children: Not on file  . Years of education: Not on file  . Highest education level: Not on file  Occupational History  . Not on file  Tobacco Use  . Smoking status: Former Smoker    Packs/day: 0.75    Years: 34.00    Pack years: 25.50    Types: Cigarettes    Quit date: 08/17/2017    Years since quitting: 2.6  . Smokeless tobacco: Never Used  . Tobacco comment: 1 cigarette per week currently  Vaping Use  . Vaping Use: Never used  Substance and Sexual Activity  . Alcohol use: Yes    Alcohol/week: 0.0 standard drinks    Comment: 09/04/2017 "a mixed drink a few  times/year"  . Drug use: No  . Sexual activity: Not Currently  Other Topics Concern  . Not on file  Social History Narrative  . Not on file   Social Determinants of Health   Financial Resource Strain:   . Difficulty of Paying Living Expenses:   Food Insecurity:   . Worried About Charity fundraiser in the Last Year:   . Arboriculturist in the Last Year:   Transportation Needs:   . Film/video editor (Medical):   Marland Kitchen Lack of Transportation (Non-Medical):   Physical Activity:   . Days of Exercise per Week:   . Minutes of Exercise per Session:   Stress:   . Feeling of Stress :   Social Connections:   . Frequency of Communication with Friends and  Family:   . Frequency of Social Gatherings with Friends and Family:   . Attends Religious Services:   . Active Member of Clubs or Organizations:   . Attends Archivist Meetings:   Marland Kitchen Marital Status:   Intimate Partner Violence:   . Fear of Current or Ex-Partner:   . Emotionally Abused:   Marland Kitchen Physically Abused:   . Sexually Abused:     Past Medical History, Surgical history, Social history, and Family history were reviewed and updated as appropriate.   Please see review of systems for further details on the patient's review from today.   Review of Systems:  Review of Systems  Constitutional: Positive for appetite change and unexpected weight change. Negative for chills and fever.  Respiratory: Positive for cough and shortness of breath. Negative for chest tightness.   Cardiovascular: Negative for chest pain, palpitations and leg swelling.  Gastrointestinal: Negative for abdominal pain, diarrhea, nausea and vomiting.  Musculoskeletal: Positive for back pain.       Mid upper right sided back pain    Objective:   Physical Exam:  BP 120/89 (BP Location: Left Arm, Patient Position: Sitting)   Pulse 87   Temp 97.7 F (36.5 C) (Temporal)   Resp 17   Ht 5\' 3"  (1.6 m)   Wt 102 lb 3.2 oz (46.4 kg)   SpO2 100%   BMI  18.10 kg/m  ECOG: 1  Physical Exam Constitutional:      Appearance: Normal appearance.     Comments: Weight loss  HENT:     Head: Normocephalic and atraumatic.  Cardiovascular:     Rate and Rhythm: Normal rate and regular rhythm.     Pulses: Normal pulses.     Heart sounds: Normal heart sounds. No murmur heard.  No friction rub. No gallop.      Comments: Tachycardia upon ambulation Pulmonary:     Effort: Pulmonary effort is normal.     Breath sounds: Normal breath sounds.     Comments: Desaturated to 85% while ambulating Musculoskeletal:        General: Tenderness present.     Comments: Right mid-back tenderness to palpation  Neurological:     Mental Status: She is alert.  Psychiatric:        Mood and Affect: Mood normal.     Lab Review:     Component Value Date/Time   NA 139 03/25/2020 1213   NA 143 02/26/2019 1555   NA 141 07/31/2017 1054   K 4.0 03/25/2020 1213   K 3.6 07/31/2017 1054   CL 103 03/25/2020 1213   CO2 25 03/25/2020 1213   CO2 23 07/31/2017 1054   GLUCOSE 93 03/25/2020 1213   GLUCOSE 103 07/31/2017 1054   BUN 9 03/25/2020 1213   BUN 11 02/26/2019 1555   BUN 5.9 (L) 07/31/2017 1054   CREATININE 0.73 03/25/2020 1213   CREATININE 0.82 03/25/2020 0929   CREATININE 0.8 07/31/2017 1054   CALCIUM 9.6 03/25/2020 1213   CALCIUM 9.9 07/31/2017 1054   PROT 7.6 03/25/2020 0929   PROT 7.9 07/31/2017 1054   ALBUMIN 4.7 03/25/2020 0929   ALBUMIN 5.0 07/31/2017 1054   AST 13 (L) 03/25/2020 0929   AST 12 07/31/2017 1054   ALT 11 03/25/2020 0929   ALT 9 07/31/2017 1054   ALKPHOS 63 03/25/2020 0929   ALKPHOS 50 07/31/2017 1054   BILITOT 0.7 03/25/2020 0929   BILITOT 0.69 07/31/2017 1054   GFRNONAA >60 03/25/2020 1213   GFRNONAA >60  03/25/2020 0929   GFRNONAA >89 04/13/2015 0919   GFRAA >60 03/25/2020 1213   GFRAA >60 03/25/2020 0929   GFRAA >89 04/13/2015 0919       Component Value Date/Time   WBC 7.1 03/25/2020 1213   RBC 4.46 03/25/2020 1213    HGB 13.8 03/25/2020 1213   HGB 14.4 03/31/2019 0821   HGB 13.1 02/26/2019 1555   HGB 14.2 07/31/2017 1054   HCT 41.5 03/25/2020 1213   HCT 37.7 02/26/2019 1555   HCT 42.6 07/31/2017 1054   PLT 133 (L) 03/25/2020 1213   PLT 147 (L) 03/31/2019 0821   PLT 146 (L) 02/26/2019 1555   MCV 93.0 03/25/2020 1213   MCV 91 02/26/2019 1555   MCV 94.6 07/31/2017 1054   MCH 30.9 03/25/2020 1213   MCHC 33.3 03/25/2020 1213   RDW 13.3 03/25/2020 1213   RDW 12.9 02/26/2019 1555   RDW 13.5 07/31/2017 1054   LYMPHSABS 1.6 03/25/2020 1213   LYMPHSABS 1.5 02/26/2019 1555   LYMPHSABS 1.5 07/31/2017 1054   MONOABS 0.4 03/25/2020 1213   MONOABS 0.3 07/31/2017 1054   EOSABS 0.1 03/25/2020 1213   EOSABS 0.1 02/26/2019 1555   EOSABS 0.1 02/01/2011 1404   BASOSABS 0.1 03/25/2020 1213   BASOSABS 0.1 02/26/2019 1555   BASOSABS 0.1 07/31/2017 1054   -------------------------------  Imaging from last 24 hours (if applicable):  Radiology interpretation: DG Chest Portable 1 View  Result Date: 03/25/2020 CLINICAL DATA:  Shortness of breath. EXAM: PORTABLE CHEST 1 VIEW COMPARISON:  09/18/2019. FINDINGS: Mediastinum is normal. Stable postsurgical changes right upper lung with right upper lung pleural-parenchymal thickening and retraction consistent scarring. Left apical pleural thickening again noted consistent scarring. No acute infiltrate. Mild basilar pleural thickening noted most consistent scarring. No prominent pleural effusion. No pneumothorax. No acute bony abnormality. IMPRESSION: Stable postsurgical changes right upper lung with right upper lung pleural-parenchymal scarring. Stable mild left apical and bibasilar pleural thickening consistent scarring. No acute pulmonary disease. Electronically Signed   By: Marcello Moores  Register   On: 03/25/2020 13:16        This case was discussed with Dr. Julien Nordmann. He expressed agreement with my management of this patient.

## 2020-03-25 NOTE — Progress Notes (Signed)
1045 (late entry): patient seen by Sandi Mealy, PA in [facility].

## 2020-03-26 ENCOUNTER — Telehealth: Payer: Self-pay | Admitting: *Deleted

## 2020-03-26 NOTE — Addendum Note (Signed)
Addended by: Harle Stanford on: 03/26/2020 01:08 PM   Modules accepted: Orders

## 2020-03-26 NOTE — Telephone Encounter (Signed)
VM received from patient requesting home oxygen as suggested by Sandi Mealy, PA on 03/25/2020.  Patient states she is having difficulty breathing with minimal exertion and would like to have the oxygen for comfort.  RN notified PA of patient request.

## 2020-03-27 DIAGNOSIS — C3490 Malignant neoplasm of unspecified part of unspecified bronchus or lung: Secondary | ICD-10-CM | POA: Diagnosis not present

## 2020-03-27 DIAGNOSIS — Z902 Acquired absence of lung [part of]: Secondary | ICD-10-CM | POA: Diagnosis not present

## 2020-04-02 ENCOUNTER — Telehealth: Payer: Self-pay | Admitting: *Deleted

## 2020-04-02 NOTE — Telephone Encounter (Signed)
I called to check on Heather [name] due to her recently presenting with SOB.  I was unable to reach her but did leave vm message with my name and phone number.

## 2020-04-15 NOTE — Addendum Note (Signed)
Addended by: Hulan Fray on: 04/15/2020 04:15 PM   Modules accepted: Orders

## 2020-04-26 DIAGNOSIS — Z902 Acquired absence of lung [part of]: Secondary | ICD-10-CM | POA: Diagnosis not present

## 2020-04-26 DIAGNOSIS — C3490 Malignant neoplasm of unspecified part of unspecified bronchus or lung: Secondary | ICD-10-CM | POA: Diagnosis not present

## 2020-05-13 ENCOUNTER — Institutional Professional Consult (permissible substitution): Payer: 59 | Admitting: Pulmonary Disease

## 2020-05-18 ENCOUNTER — Ambulatory Visit: Payer: 59 | Admitting: Internal Medicine

## 2020-05-18 ENCOUNTER — Encounter: Payer: Self-pay | Admitting: Internal Medicine

## 2020-05-18 ENCOUNTER — Other Ambulatory Visit: Payer: Self-pay

## 2020-05-18 DIAGNOSIS — H6123 Impacted cerumen, bilateral: Secondary | ICD-10-CM

## 2020-05-18 DIAGNOSIS — H6090 Unspecified otitis externa, unspecified ear: Secondary | ICD-10-CM | POA: Insufficient documentation

## 2020-05-18 DIAGNOSIS — H60502 Unspecified acute noninfective otitis externa, left ear: Secondary | ICD-10-CM | POA: Diagnosis not present

## 2020-05-18 DIAGNOSIS — G4700 Insomnia, unspecified: Secondary | ICD-10-CM | POA: Diagnosis not present

## 2020-05-18 MED ORDER — CIPROFLOXACIN HCL 0.2 % OT SOLN: 0.2000 mL | Freq: Two times a day (BID) | OTIC | 0 refills | Status: DC

## 2020-05-18 MED ORDER — TRAZODONE HCL 50 MG PO TABS: 50.0000 mg | ORAL_TABLET | Freq: Every evening | ORAL | 0 refills | Status: DC | PRN

## 2020-05-18 MED FILL — CIPROFLOXACIN 0.2% OTIC SOL: 0.2 | 7 days supply | Qty: 14 | Fill #0

## 2020-05-18 MED FILL — traZODone HCL 50 MG TABS: 50 | 30 days supply | Qty: 30 | Fill #0

## 2020-05-18 NOTE — Progress Notes (Signed)
   CC: Ear pain  HPI:  [name] is a 49 y.o. with the history listed below presenting with ear pain.   Past Medical History:  Diagnosis Date  . Blood dyscrasia   . COPD (chronic obstructive pulmonary disease) ([place])   . Current smoker   . Difficult intubation    pt was told difficult intubation at [facility]  . 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 Headache    "weekly" (09/04/2017)  . Heart murmur   . Hypokalemia 10/15/2017  . Lung cancer ([place]) dx'd 08/2017  . Pleuritic chest pain 06/13/2018  . Pre-diabetes   . Pulmonary nodule 07/16/2017  . Rapid resting heart rate    happens infrequently  . Rib pain on right side 12/21/2017   Review of Systems:   Constitutional: Negative for chills and fever. HEENT: Positive for left ear pain, decreased hearing, and left facial numbness.   Respiratory: Negative for shortness of breath.   Cardiovascular: Negative for chest pain and leg swelling.  Gastrointestinal: Negative for abdominal pain, nausea and vomiting.  Neurological: Negative for dizziness and headaches.    Physical Exam:  Vitals:   05/18/20 0953  BP: 118/68  Pulse: 72  SpO2: 99%  Weight: 104 lb 14.4 oz (47.6 kg)  Height: 5' 3.25" (1.607 m)   Physical Exam Constitutional:      Appearance: Normal appearance.  HENT:     Head:     Comments: TTP over left maxillary sinus    Right Ear: There is impacted cerumen.     Left Ear: There is impacted cerumen.     Ears:     Comments: Left ear canal painful to palpation, minimal erythema noted. BL ear canals completed impacted.    Mouth/Throat:     Mouth: Mucous membranes are moist.     Pharynx: Oropharynx is clear.     Comments: Poor dentition Cardiovascular:     Rate and Rhythm: Normal rate and regular rhythm.     Pulses: Normal pulses.     Heart sounds: Normal heart sounds.  Pulmonary:     Effort: Pulmonary effort is normal.     Breath sounds: Normal breath sounds.  Abdominal:     General:  Abdomen is flat. Bowel sounds are normal.     Palpations: Abdomen is soft.  Skin:    General: Skin is warm and dry.     Capillary Refill: Capillary refill takes less than 2 seconds.  Neurological:     General: No focal deficit present.     Mental Status: She is alert and oriented to person, place, and time.     Comments: Decreased hearing from left ear. Weber test showed sound heard louder in left ear. Rinne test on left had BC > AC.   Psychiatric:        Mood and Affect: Mood normal.        Behavior: Behavior normal.     Assessment & Plan:   See Encounters Tab for problem based charting.  Patient discussed with Dr. Philipp Ovens

## 2020-05-18 NOTE — Assessment & Plan Note (Signed)
Patient reports that after starting the trazodone she has been able to sleep for 6 to 7 hours, previously she had only been sleeping 3 to 4 hours every night.  She feels that the trazodone has been helping and is requesting a refill.  We will refill this 1 time and have her follow-up with her PCP.

## 2020-05-18 NOTE — Patient Instructions (Signed)
[name],  It was a pleasure to see you today. Thank you for coming in.   Today we discussed your ear pain.  You were found to have a lot of earwax in both ears, ear pain may be from a mild infection.  Please start using the ciprofloxacin eardrops twice a day for the next 7 days.  If you develop any new symptoms, fevers, loss of hearing, or worsening pain please contact us.  Once the pain has improved you can use over-the-counter earwax removal kits for your earwax.  Avoid putting anything into your ear including Q-tips or Bobby pins, this could push the earwax deeper into your ear.  Please return to clinic as needed.   Thank you again for coming in.   Asencion Noble.D.

## 2020-05-18 NOTE — Assessment & Plan Note (Addendum)
Patient reports that yesterday she started having left ear pain that significantly worsened this morning.  Associated with decreased hearing, left facial pressure, left facial numbness and some left-sided eye pain.  She reports a chronic cough that feels may be getting somewhat worse, coughing up green sputum occasionally.  She denies any ear drainage, nausea, vomiting, fevers, chills.  She endorses bilateral decreased vision which she sees an eye doctor for.  She reports that she got an MRI when she was having this, told that she may have aura migraines.  She reports she has never had this before.  She denied any trauma to the ear.  Denied any swimming or recent airplane rides.  She reports that she uses a Bobby pin to try to take out her earwax, does not use any Q-tips.  On exam she has tenderness to examination of the left ear canal, mild erythema noted, bilateral ears sore severely impacted with cerumen.  The Weber and writing test were performed that were consistent with conductive hearing loss.  We attempted irrigation with hydrogen peroxide and water, however patient was unable to tolerate this due to pain.  This could be concerning for otitis externa, unable to completely evaluate the tympanic membrane so rupture unable to be ruled out however this appears less likely.  Discussed findings with patient, discussed treatment with ciprofloxacin eardrops and symptoms to monitor.  Advised to contact us if symptoms do not improve, she has significant worsening pain, complete hearing loss, or new symptoms develop.  If this occurs would consider ENT referral.  -Ciprofloxacin eardrops x7 days -Provided information on earwax removal, advised to stop using bobby pins to remove earwax

## 2020-05-18 NOTE — Progress Notes (Signed)
Internal Medicine Clinic Attending  I saw and evaluated the patient.  I personally confirmed the key portions of the history and exam documented by Dr. Krienke and I reviewed pertinent patient test results.  The assessment, diagnosis, and plan were formulated together and I agree with the documentation in the resident's note.    

## 2020-05-21 ENCOUNTER — Telehealth: Payer: Self-pay

## 2020-05-21 NOTE — Telephone Encounter (Signed)
Received message from front desk to call patient regarding an appt request. TC placed to patient, VM obtained and Hippa compliant message left to call triage back. SChaplin, RN,BSN

## 2020-05-27 DIAGNOSIS — Z902 Acquired absence of lung [part of]: Secondary | ICD-10-CM | POA: Diagnosis not present

## 2020-05-27 DIAGNOSIS — C3490 Malignant neoplasm of unspecified part of unspecified bronchus or lung: Secondary | ICD-10-CM | POA: Diagnosis not present

## 2020-06-10 ENCOUNTER — Institutional Professional Consult (permissible substitution): Payer: 59 | Admitting: Pulmonary Disease

## 2020-06-18 ENCOUNTER — Encounter: Payer: Self-pay | Admitting: Internal Medicine

## 2020-06-18 DIAGNOSIS — G4700 Insomnia, unspecified: Secondary | ICD-10-CM

## 2020-06-21 MED ORDER — TRAZODONE HCL 50 MG PO TABS: 50.0000 mg | ORAL_TABLET | Freq: Every evening | ORAL | 0 refills | Status: DC | PRN

## 2020-06-21 MED FILL — traZODone HCL 50 MG TABS: 50 | 30 days supply | Qty: 30 | Fill #0

## 2020-06-22 ENCOUNTER — Institutional Professional Consult (permissible substitution): Payer: 59 | Admitting: Pulmonary Disease

## 2020-06-27 DIAGNOSIS — Z902 Acquired absence of lung [part of]: Secondary | ICD-10-CM | POA: Diagnosis not present

## 2020-06-27 DIAGNOSIS — C3490 Malignant neoplasm of unspecified part of unspecified bronchus or lung: Secondary | ICD-10-CM | POA: Diagnosis not present

## 2020-07-19 ENCOUNTER — Other Ambulatory Visit: Payer: Self-pay | Admitting: Internal Medicine

## 2020-07-19 DIAGNOSIS — Z1231 Encounter for screening mammogram for malignant neoplasm of breast: Secondary | ICD-10-CM

## 2020-07-20 ENCOUNTER — Encounter: Payer: Self-pay | Admitting: Internal Medicine

## 2020-07-20 ENCOUNTER — Telehealth: Payer: Self-pay

## 2020-07-20 NOTE — Telephone Encounter (Signed)
Patient returned call.  States she is currently at work in [place] in [employer]  C/O SOB, coughing, dizziness with movement and states she has fallen 3 times over the weekend.  Now notes some blood in her sputum when coughing.  States she has been monitoring he O2 sats at home which she reports 98% at rest on RA, but drops to 87% with movement.  Coughing and SOB noted during conversation with RN. Pt informed to go the ED now for evaluation and highly encouraged to report s/s to employee health/screening questionnaire.   Pt states she doesn't think it's Covid because she can still taste her food and always has a chronic cough.  RN educated patient that coughing, SOB, fatigue, and dropping of O2 sats are also signs of Covid and advised she needs to be tested and evaluated in ED for dropping O2 sats.  She verbalized understanding. SChaplin, RN,BSN

## 2020-07-20 NOTE — Telephone Encounter (Signed)
TC to patient.  Patient states she called [employer] [facility] [employer] and they are sending her home and sending her for Covid testing.  She still declines ED visit and declines [facility] appt today.  Pt states she was told she would have the Covid test results back later today or 1st thing tomorrow morning.  Pt agrees to schedule [facility] appt tomorrow afternoon.  Telehealth appt made for 07/21/20 @ 3:15 with Dr. Truman Hayward and patient instructed if her Covid test is negative, to call office back and change this to in person appt, she verbalized understanding and agreement.  As of note, no SOB noted during conversation, patient states she was in a panic this morning about her symptoms.  RN still reiterated to patient with ongoing dizziness, SOB, and O2 sats dropping with activity to present to ED. SChaplin, RN,BSN

## 2020-07-20 NOTE — Telephone Encounter (Signed)
Reached out to patient again, she is still at work.  Pt was informed that RN discussed earlier phone call with her MD and he is also recommending she stop working and present to ED now.  Pt states "I don't won't to go to the ED as busy as they are and wait hours on end.  I am able to sit here at my job and not get up, so I'll be okay.  Besides we are very busy here where I work".  RN highly encouraged patient to present to ED, suggested she call her supervisor for coverage and notify employee health d/t symptoms.  Pt states "I really haven't had a change in symptoms".  RN reminded patient that coughing up blood in sputum, falling multiple times from lightheadedness, and O2 sats dropping to 87% with activity is a change in her symptoms and does require immediate evaluation.  She is still declining to go to the ED and states "she will keep it in mind about notifying employee health". SChaplin, RN,BSN

## 2020-07-20 NOTE — Telephone Encounter (Signed)
From: Heather [name]  Sent: 07/20/2020  7:09 AM EDT  To: [facility] Front Desk Pool  Subject: Appointment Request                 Appointment Request From: Heather [name]    With Provider: Asencion Noble, MD [facility]]    Preferred Date Range: 07/20/2020 - 07/22/2020    Preferred Times: Any Time    Reason for visit: Office Visit    Comments:  Hi i was wondering if I could be seen for this cough and shortness of breath that is just getting worse, I have coughed up some blood here and there now probably just from coughing so much but just dont know. I have been getting really light headed also and have fallen already 3 times over this past weekend without any warning. Just get lightheaded and down I go. My oxygen levels having been staying 97 to 98 while I am still but they drop when i do things. Thank you, [name]   TC to patient, VM obtained and message left to call nurse back. SChaplin, RN,BSN

## 2020-07-20 NOTE — Telephone Encounter (Signed)
I agree that patient needs to be seen in ED ASAP. Please schedule her for an urgent appointment here this afternoon if she does not go to the ED

## 2020-07-20 NOTE — Telephone Encounter (Signed)
Thank you :)

## 2020-07-21 ENCOUNTER — Telehealth: Payer: Self-pay | Admitting: Internal Medicine

## 2020-07-21 ENCOUNTER — Ambulatory Visit (INDEPENDENT_AMBULATORY_CARE_PROVIDER_SITE_OTHER): Payer: 59 | Admitting: Internal Medicine

## 2020-07-21 ENCOUNTER — Other Ambulatory Visit: Payer: Self-pay

## 2020-07-21 ENCOUNTER — Other Ambulatory Visit: Payer: Self-pay | Admitting: Internal Medicine

## 2020-07-21 ENCOUNTER — Encounter: Payer: Self-pay | Admitting: Internal Medicine

## 2020-07-21 DIAGNOSIS — R042 Hemoptysis: Secondary | ICD-10-CM | POA: Diagnosis not present

## 2020-07-21 DIAGNOSIS — Z91041 Radiographic dye allergy status: Secondary | ICD-10-CM

## 2020-07-21 MED ORDER — DIPHENHYDRAMINE HCL 50 MG PO TABS: 50.0000 mg | ORAL_TABLET | Freq: Once | ORAL | 0 refills | Status: DC

## 2020-07-21 MED ORDER — PREDNISONE 10 MG PO TABS: 50.0000 mg | ORAL_TABLET | Freq: Every day | ORAL | 0 refills | Status: DC

## 2020-07-21 MED FILL — predniSONE 10 MG TABS: 10 | 1 days supply | Qty: 5 | Fill #0

## 2020-07-21 NOTE — Telephone Encounter (Signed)
Discussed with [name] regarding getting her CTA performed. She mentions that she has history of allergy to contrast although she was able to tolerate contrasted studies in the past with pre-medications. Advised regarding the usual premedication prophylaxis for contrast allergy and need to start medications tonight. She mentions that she uses [facility] and would not be able to pick up prednisone prior to closing. Discussed option of shorter pre-medication due to urgency of her work-up, which she agrees with as she has had shorter pre-medication on prior ED visits and tolerated it in the past. Will send oral prednisone and diphenhydramine to [facility] outpatient pharmacy for [name] to pick up and take as early as possible prior to her CTA.

## 2020-07-21 NOTE — Assessment & Plan Note (Signed)
Heather [name] is a 49 yo F w/ PMH of lung cancer s/p lobectomy and factor V leiden presenting to [facility] via telehealth visit with complaint of cough. She was in her usual state of health until couple days prior when she began to noticed worsening dyspnea and hemoptysis. She mentions that she has had chronic cough ever since her lobectomy for her lung cancer but has recently noticed red streaks in her sputum. She also mentions noticing her oxygen saturation dropping with exertion down to the 80s. She mentions these symptoms are associated with palpitations with her heart rate rising to over 100. She also mentions feeling chest pressure, especially with feeling of dyspnea and cough. She denies any fevers, chills. 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  A/P Present with hemoptysis, hypoxia, chest pain. COVID-19 ruled out. Concerning for PE in setting of hx of malignancy and factor V leiden. Wells score of 6.5. Advised to go to ED right away for evaluation but patient declined due to wait time and risk of exposure to viral illnesses. Will attempt to arrange outpatient chest CTA right away. - Stat CTA chest - Advised on red-flag symptoms to go to ED

## 2020-07-21 NOTE — Progress Notes (Signed)
  [facility] Internal Medicine Residency Telephone Encounter Continuity Care Appointment  HPI:   This telephone encounter was created for [name] on 07/21/2020 for the following purpose/cc: Cough  Ms.Rotert is a 49 yo F w/ PMH of lung cancer s/p lobectomy, factor V leiden previously on lovenox presenting to [facility] via telehealth visit with cough. Please see assessment and plan based charting for further details.   Past Medical History:  Past Medical History:  Diagnosis Date  . Blood dyscrasia   . COPD (chronic obstructive pulmonary disease) ([place])   . Current smoker   . Difficult intubation    pt was told difficult intubation [facility]  . 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 Headache    "weekly" (09/04/2017)  . Heart murmur   . Hypokalemia 10/15/2017  . Lung cancer ([place]) dx'd 08/2017  . Pleuritic chest pain 06/13/2018  . Pre-diabetes   . Pulmonary nodule 07/16/2017  . Rapid resting heart rate    happens infrequently  . Rib pain on right side 12/21/2017      ROS:  Review of Systems  Constitutional: Positive for malaise/fatigue. Negative for chills and fever.  HENT: Negative for congestion and sore throat.   Eyes: Negative for blurred vision.  Respiratory: Positive for cough, hemoptysis, shortness of breath and wheezing.   Cardiovascular: Positive for chest pain, palpitations and leg swelling.  Gastrointestinal: Negative for constipation, diarrhea, nausea and vomiting.  Neurological: Negative for dizziness and headaches.  All other systems reviewed and are negative.      Assessment / Plan / Recommendations:   Please see A&P under problem oriented charting for assessment of the patient's acute and chronic medical conditions.   As always, pt is advised that if symptoms worsen or new symptoms arise, they should go to an urgent care facility or to to ER for further evaluation.   Consent and Medical Decision Making:   Patient discussed with  Dr. Dareen Piano  This is a telephone encounter between Heather [name] and Mosetta Anis on 07/21/2020 for cough. The visit was conducted with the patient located at home and Mosetta Anis at [facility]. The patient's identity was confirmed using their DOB and current address. The patient has consented to being evaluated through a telephone encounter and understands the associated risks (an examination cannot be done and the patient may need to come in for an appointment) / benefits (allows the patient to remain at home, decreasing exposure to coronavirus). I personally spent 13 minutes on medical discussion.

## 2020-07-22 ENCOUNTER — Encounter (HOSPITAL_COMMUNITY): Payer: Self-pay

## 2020-07-22 ENCOUNTER — Telehealth: Payer: Self-pay | Admitting: *Deleted

## 2020-07-22 ENCOUNTER — Ambulatory Visit (HOSPITAL_COMMUNITY)
Admission: RE | Admit: 2020-07-22 | Discharge: 2020-07-22 | Disposition: A | Discharge status: Home / Self Care | Payer: 59 | Source: Ambulatory Visit | Attending: Internal Medicine | Admitting: Internal Medicine

## 2020-07-22 DIAGNOSIS — R059 Cough, unspecified: Secondary | ICD-10-CM | POA: Diagnosis not present

## 2020-07-22 DIAGNOSIS — R042 Hemoptysis: Secondary | ICD-10-CM | POA: Insufficient documentation

## 2020-07-22 LAB — POCT I-STAT CREATININE: Creatinine, Ser: 0.7 mg/dL (ref 0.44–1.00)

## 2020-07-22 MED ORDER — IOHEXOL 350 MG/ML SOLN
100.0000 mL | Freq: Once | INTRAVENOUS | Status: AC | PRN
  Administered 2020-07-22: 100 mL via INTRAVENOUS

## 2020-07-22 NOTE — Telephone Encounter (Signed)
[facility] called to say results for CT chest are in Epic:  IMPRESSION: 1. Negative for pulmonary embolism. No acute abnormality in the chest 2. Stable post treatment scarring right apex.

## 2020-07-22 NOTE — Telephone Encounter (Signed)
Thank you! Josh can we call her with the results?

## 2020-07-22 NOTE — Progress Notes (Signed)
Internal Medicine Clinic Attending  Case discussed with Dr. Truman Hayward  At the time of the visit.  We reviewed the resident's history and exam and pertinent patient test results.  I agree with the assessment, diagnosis, and plan of care documented in the resident's note.  I have also sent the patient a message on the day prior to her appointment encouraging her to go to the emergency room for further evaluation but did not hear back from her.  I also attempted to call the patient after her televisit with Dr. Truman Hayward but was unable to reach her and left a message on her answering machine encouraging her to go to the emergency room for further evaluation.

## 2020-07-22 NOTE — Telephone Encounter (Signed)
Discussed the results of the CTA chest with [name] regarding no evidence of pulmonary embolism or new lung mass. Discussed need for bronchoscopy to assess for source of bleed if her bleeding persist. [name] states that she was referred to [facility] by her oncologist and she will try to set up an appointment with them. She mentions that she also have home oxygen available. Discussed red-flag symptoms and need to go to ED promptly or arrange urgent clinic visit if her symptoms worsen. All other questions and concerns addressed.

## 2020-07-22 NOTE — Progress Notes (Signed)
Patient has had iv contrast in past without premeds allergy facial swelling. Patient has had 13 hour premeds and hives break thru reaction. Per Dr. Posey Pronto benefit outweighs risk, scan patient with 13 hour premeds on board.

## 2020-07-27 DIAGNOSIS — C3490 Malignant neoplasm of unspecified part of unspecified bronchus or lung: Secondary | ICD-10-CM | POA: Diagnosis not present

## 2020-07-27 DIAGNOSIS — Z902 Acquired absence of lung [part of]: Secondary | ICD-10-CM | POA: Diagnosis not present

## 2020-08-18 ENCOUNTER — Other Ambulatory Visit: Payer: Self-pay

## 2020-08-18 ENCOUNTER — Other Ambulatory Visit: Payer: Self-pay | Admitting: *Deleted

## 2020-08-18 DIAGNOSIS — Z1211 Encounter for screening for malignant neoplasm of colon: Secondary | ICD-10-CM

## 2020-08-18 NOTE — Progress Notes (Signed)
FIT Test Only.

## 2020-08-27 DIAGNOSIS — Z902 Acquired absence of lung [part of]: Secondary | ICD-10-CM | POA: Diagnosis not present

## 2020-08-27 DIAGNOSIS — C3490 Malignant neoplasm of unspecified part of unspecified bronchus or lung: Secondary | ICD-10-CM | POA: Diagnosis not present

## 2020-08-30 ENCOUNTER — Ambulatory Visit: Payer: 59

## 2020-09-26 DIAGNOSIS — Z902 Acquired absence of lung [part of]: Secondary | ICD-10-CM | POA: Diagnosis not present

## 2020-09-26 DIAGNOSIS — C3490 Malignant neoplasm of unspecified part of unspecified bronchus or lung: Secondary | ICD-10-CM | POA: Diagnosis not present

## 2020-10-13 ENCOUNTER — Ambulatory Visit: Payer: 59

## 2020-10-25 ENCOUNTER — Telehealth: Payer: Self-pay | Admitting: Internal Medicine

## 2020-10-25 DIAGNOSIS — F32A Depression, unspecified: Secondary | ICD-10-CM

## 2020-10-25 NOTE — Telephone Encounter (Signed)
Pt called this morning requesting a Referral for Dr. Theodis Shove. Pt states she spoke with you recently.   Please advise can a referral be placed for Dr. Theodis Shove for Psych and an appointment will be made for the patient as soon as possible.

## 2020-10-25 NOTE — Telephone Encounter (Signed)
Will place order

## 2020-10-27 DIAGNOSIS — Z902 Acquired absence of lung [part of]: Secondary | ICD-10-CM | POA: Diagnosis not present

## 2020-10-27 DIAGNOSIS — C3490 Malignant neoplasm of unspecified part of unspecified bronchus or lung: Secondary | ICD-10-CM | POA: Diagnosis not present

## 2020-10-28 ENCOUNTER — Other Ambulatory Visit: Payer: Self-pay | Admitting: Internal Medicine

## 2020-10-28 DIAGNOSIS — Z1231 Encounter for screening mammogram for malignant neoplasm of breast: Secondary | ICD-10-CM

## 2020-10-29 ENCOUNTER — Encounter: Payer: Self-pay | Admitting: Internal Medicine

## 2020-11-16 ENCOUNTER — Institutional Professional Consult (permissible substitution): Payer: 59 | Admitting: Behavioral Health

## 2020-11-19 ENCOUNTER — Other Ambulatory Visit: Payer: Self-pay

## 2020-11-19 ENCOUNTER — Ambulatory Visit
Admission: RE | Admit: 2020-11-19 | Discharge: 2020-11-19 | Disposition: A | Discharge status: Home / Self Care | Payer: 59 | Source: Ambulatory Visit

## 2020-11-19 DIAGNOSIS — Z1231 Encounter for screening mammogram for malignant neoplasm of breast: Secondary | ICD-10-CM

## 2020-11-27 DIAGNOSIS — Z902 Acquired absence of lung [part of]: Secondary | ICD-10-CM | POA: Diagnosis not present

## 2020-11-27 DIAGNOSIS — C3490 Malignant neoplasm of unspecified part of unspecified bronchus or lung: Secondary | ICD-10-CM | POA: Diagnosis not present

## 2020-11-30 ENCOUNTER — Ambulatory Visit: Payer: 59 | Admitting: Behavioral Health

## 2020-11-30 ENCOUNTER — Other Ambulatory Visit: Payer: Self-pay

## 2020-11-30 DIAGNOSIS — F419 Anxiety disorder, unspecified: Secondary | ICD-10-CM

## 2020-11-30 NOTE — BH Specialist Note (Signed)
Integrated Behavioral Health via Telemedicine Visit  11/30/2020 Heather [name] [id number]  Number of [place] visits: 1/6 Session Start time: 1:00pm  Session End time: 1:50pm Total time: 50   Referring Provider: Self Referral Patient/Family location: Outside @ work in private [facility] Provider location: [facility] Office All persons participating in visit: Pt & Clinician Types of Service: Individual psychotherapy  I connected with Heather [name] and/or Heather [name]'s self by Telephone  (Video is Caregility application) and verified that I am speaking with the correct person using two identifiers.Discussed confidentiality: Yes   I discussed the limitations of telemedicine and the availability of in person appointments.  Discussed there is a possibility of technology failure and discussed alternative modes of communication if that failure occurs.  I discussed that engaging in this telemedicine visit, they consent to the provision of behavioral healthcare and the services will be billed under their insurance.  Patient and/or legal guardian expressed understanding and consented to Telemedicine visit: Yes   Presenting Concerns: Patient and/or family reports the following symptoms/concerns: elevated anxiety due to COVID-19 & life circumstances invlg health status changes Duration of problem: 3 yrs; Severity of problem: moderate  Patient and/or Family's Strengths/Protective Factors: Social connections, Social and Emotional competence, Concrete supports in place (healthy food, safe environments, etc.) and Sense of purpose  Goals Addressed: Patient will: 1.  Reduce symptoms of: anxiety, depression and stress  2.  Increase knowledge and/or ability of: healthy habits and stress reduction  3.  Demonstrate ability to: Increase healthy adjustment to current life circumstances  Progress towards Goals: Estb'd today: Pt will cont psychotherapy for several months to reduce anxiety  & stressors, improve sleep, & initiate walking to her comfort levels  Interventions: Interventions utilized:  Intake/Assessment Standardized Assessments completed: Not Needed  Patient and/or Family Response: Pt receptive to session via telehealth today. Pt grateful for suggestions & cont'd contact.  Assessment: Patient currently experiencing elevated anxiety due to work stressors, general health concerns, & needing encouragement w/life challenges.   Patient may benefit from visual imagery tech (peaceful place), walking w/her music on the trail, Review of the Day tool prior to bedtime, [email or web address], a notebook to record concerns, & refill of script for Trazadone to use prn.  Plan: 1. Follow up with behavioral health clinician on : 3 wks out for 45 min on telehealth 2. Behavioral recommendations: Pt will try to initiate self-care practices, esp'ly around sleep.  3. Referral(s): [facility] [place] (In Clinic)  I discussed the assessment and treatment plan with the patient and/or parent/guardian. They were provided an opportunity to ask questions and all were answered. They agreed with the plan and demonstrated an understanding of the instructions.   They were advised to call back or seek an in-person evaluation if the symptoms worsen or if the condition fails to improve as anticipated.  Heather Hutching, LMFT

## 2020-12-22 ENCOUNTER — Ambulatory Visit: Payer: 59 | Admitting: Behavioral Health

## 2020-12-22 ENCOUNTER — Other Ambulatory Visit: Payer: Self-pay

## 2020-12-22 DIAGNOSIS — G4700 Insomnia, unspecified: Secondary | ICD-10-CM

## 2020-12-22 DIAGNOSIS — R634 Abnormal weight loss: Secondary | ICD-10-CM

## 2020-12-22 DIAGNOSIS — F419 Anxiety disorder, unspecified: Secondary | ICD-10-CM

## 2020-12-22 NOTE — BH Specialist Note (Signed)
Integrated Behavioral Health via Telemedicine Visit  12/22/2020 Heather [name] [id number]  Number of [place] visits: 2/6 Session Start time: 2:00pm  Session End time: 2:45pm Total time: 62   Referring Provider: Dr. Aldine Contes, MD Patient/Family location: Pt at home sick today w/sinus allergies [facility] Provider location: [facility] Office All persons participating in visit: Pt & Clinician Types of Service: Individual psychotherapy  I connected with [name] and/or Cordelia Pen Pepper's self via  Telephone or Video Enabled Telemedicine Application  (Video is Caregility application) and verified that I am speaking with the correct person using two identifiers. Discussed confidentiality: Yes   I discussed the limitations of telemedicine and the availability of in person appointments.  Discussed there is a possibility of technology failure and discussed alternative modes of communication if that failure occurs.  I discussed that engaging in this telemedicine visit, they consent to the provision of behavioral healthcare and the services will be billed under their insurance.  Patient and/or legal guardian expressed understanding and consented to Telemedicine visit: Yes   Presenting Concerns: Patient and/or family reports the following symptoms/concerns: elevated concern for weight loss & work stress Duration of problem: months; Severity of problem: moderate  Patient and/or Family's Strengths/Protective Factors: Social and Emotional competence and Concrete supports in place (healthy food, safe environments, etc.)  Goals Addressed: Patient will: 1.  Reduce symptoms of: anxiety and stress  2.  Increase knowledge and/or ability of: healthy habits and stress reduction  3.  Demonstrate ability to: Increase healthy adjustment to current life circumstances  Progress towards Goals: Ongoing  Interventions: Interventions utilized:  Solution-Focused Strategies,  Mindfulness or Relaxation Training and Supportive Counseling Standardized Assessments completed: Not Needed  Patient and/or Family Response: Pt receptive to call today, although she is home sick w/pollen allergies. Pt has not run a fever; she feels pressure in her head & she has facial swelling.  Pt has used the suggestions from our first session to deal w/her work stress & lack of sleep. She is also worried for lack of sleep & reduced appetite.  Assessment: Patient currently experiencing lack of sleep, head cold due to allergies, & work stress due to COVID-19 resulting in frustrated to angry Pt responses. Pt is worried for her weight loss, she currently weighs 97# & she is 5'3".   Patient may benefit from cont'd support to provide resources, coping tools, & suggestions to regulate her sleep hygiene.  Plan: 1. Follow up with behavioral health clinician on : 2-3 wks on telehealth @ 2:00pm in the afternoon. She cannot make an appt btwn April 3-April 6. Her Son in [place] in [place] is coming home to visit. 2. Behavioral recommendations: Try using the addt'l suggestions from today's visit. I know you record what we do-I appreciate the efforts you are making! 3. Pt needs a refill on her Trazadone 50mg  prescription.  4. Referral(s): [place] (In Clinic)  I discussed the assessment and treatment plan with the patient and/or parent/guardian. They were provided an opportunity to ask questions and all were answered. They agreed with the plan and demonstrated an understanding of the instructions.   They were advised to call back or seek an in-person evaluation if the symptoms worsen or if the condition fails to improve as anticipated.  Donnetta Hutching, LMFT

## 2020-12-23 ENCOUNTER — Telehealth: Payer: Self-pay | Admitting: Behavioral Health

## 2020-12-23 NOTE — Telephone Encounter (Signed)
Lft msg w/Pt instructing her to call [facility] & schedule a f/u appt to address need for Trazadone & weight loss issue. After consult w/Dr. Dareen Piano, he has requested Pt do this for her healthcare concerns.  Dr. Theodis Shove

## 2020-12-25 DIAGNOSIS — Z902 Acquired absence of lung [part of]: Secondary | ICD-10-CM | POA: Diagnosis not present

## 2020-12-25 DIAGNOSIS — C3490 Malignant neoplasm of unspecified part of unspecified bronchus or lung: Secondary | ICD-10-CM | POA: Diagnosis not present

## 2020-12-27 ENCOUNTER — Other Ambulatory Visit: Payer: Self-pay | Admitting: Student

## 2020-12-27 ENCOUNTER — Encounter: Payer: Self-pay | Admitting: Student

## 2020-12-27 ENCOUNTER — Ambulatory Visit (INDEPENDENT_AMBULATORY_CARE_PROVIDER_SITE_OTHER): Payer: 59 | Admitting: Student

## 2020-12-27 VITALS — BP 130/81 | HR 85 | Temp 98.1°F | Ht 63.0 in | Wt 99.2 lb

## 2020-12-27 DIAGNOSIS — G4700 Insomnia, unspecified: Secondary | ICD-10-CM | POA: Diagnosis not present

## 2020-12-27 DIAGNOSIS — R634 Abnormal weight loss: Secondary | ICD-10-CM | POA: Diagnosis not present

## 2020-12-27 MED ORDER — TRAZODONE HCL 50 MG PO TABS: 50.0000 mg | ORAL_TABLET | Freq: Every evening | ORAL | 0 refills | Status: DC | PRN

## 2020-12-27 MED ORDER — PANTOPRAZOLE SODIUM 40 MG PO TBEC: 40.0000 mg | DELAYED_RELEASE_TABLET | Freq: Every day | ORAL | 1 refills | Status: DC

## 2020-12-27 MED FILL — PANTOPRAZOLE SOD DR 40 MG T: 40 | 30 days supply | Qty: 30 | Fill #0

## 2020-12-27 MED FILL — traZODone HCL 50 MG TABS: 50 | 30 days supply | Qty: 30 | Fill #0

## 2020-12-27 NOTE — Assessment & Plan Note (Signed)
Patient is seen today for weight loss.  She was 104 pounds in March 2021 and down to 99 pounds today.  Patient states that she does have good appetite.  However when she started eating, she will immediately feel full after 2 bites.  States that this issue has been going on for about 1 year.  Also endorses constant nausea with frequent belching.  She denies dysphagia, odynophagia.  She had an endoscopy in 2015 which showed peptic duodenitis with no celiac disease.  Her H. pylori was treated.  Assessment and plan Her symptoms consistent with dyspepsia.  We will start Protonix 40 mg daily.  We will also refer patient to GI for possible endoscopy given her history of peptic duodenitis.  -Start Protonix 40 mg daily -GI referral

## 2020-12-27 NOTE — Patient Instructions (Signed)
Ms. Harney,  It is a pleasure seeing you in the clinic today.  Here is a summary of what we talked about  1.  Weight loss: I will send you to a gastroenterologist for further follow-up.  In the meantime, I will start a PPI, Protonix to help with your dyspepsia symptoms.  2.  Insomnia: I will send a refill trazodone to the pharmacy.  Please let me know if we need to increase the dose.  Take care  Dr. Alfonse Spruce

## 2020-12-27 NOTE — Assessment & Plan Note (Signed)
Patient reports issue with staying asleep.  She denies issue with initiating sleep.  States that she only sleeps 2-3 hours a day because her mind will not shut off 2/2 stress from work.  She denies snoring or OSA symptoms.  She has tried over-the-counter medication such as Tylenol in the past without any relief.  States that trazodone helps her get 5-6 hours asleep at night.  Assessment and plan I performed GAD-7 questionnaire patient score 8, suggest mild anxiety.  She did not qualify for GAD per DSM-V however she is at the borderline.  Her insomnia can possibly related to her excessive worrying.  Patient states that she would not like to try it an additional medication if not necessary.  We will refill trazodone 50 mg, can increase the dose if not working.  Advised patient to let us know if her anxiety symptoms get worse so that we can reevaluate.  -Trazodone 50 mg at bedtime

## 2020-12-27 NOTE — Progress Notes (Signed)
   CC: Weight loss and Insomnia   HPI:  [name] is a 50 y.o. with PMH of stage Ia adenocarcinoma s/p lobectomy 2108, Factor V leiden, who presented to the clinic for evaluation of weight loss and insomnia.  Please see problem based charting for further detail  Past Medical History:  Diagnosis Date  . Blood dyscrasia   . COPD (chronic obstructive pulmonary disease) ([place])   . Current smoker   . Difficult intubation    [facility]  . 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 Headache    "weekly" (09/04/2017)  . Heart murmur   . Hypokalemia 10/15/2017  . Lung cancer ([place]) dx'd 08/2017  . Pleuritic chest pain 06/13/2018  . Pre-diabetes   . Pulmonary nodule 07/16/2017  . Rapid resting heart rate    happens infrequently  . Rib pain on right side 12/21/2017   Review of Systems:  Review of Systems  Gastrointestinal: Positive for abdominal pain, heartburn and nausea.  Psychiatric/Behavioral: The patient has insomnia.     Physical Exam:  Vitals:   12/27/20 1507 12/27/20 1509  BP:  130/81  Pulse:  85  Temp:  98.1 F (36.7 C)  TempSrc:  Oral  SpO2:  100%  Weight: 99 lb 3.2 oz (45 kg) 99 lb 3.2 oz (45 kg)  Height: 5\' 3"  (1.6 m) 5\' 3"  (1.6 m)   Physical Exam Constitutional:      General: She is in acute distress.     Comments: Frail appearing  HENT:     Head: Normocephalic.  Cardiovascular:     Rate and Rhythm: Normal rate and regular rhythm.  Pulmonary:     Effort: Pulmonary effort is normal. No respiratory distress.  Abdominal:     General: Bowel sounds are normal. There is no distension.     Palpations: There is no mass.     Tenderness: There is abdominal tenderness (Epigastric).     Hernia: No hernia is present.  Skin:    General: Skin is warm.  Neurological:     Mental Status: She is alert.  Psychiatric:        Mood and Affect: Mood normal.     Assessment & Plan:   See Encounters Tab for problem  based charting.  Patient discussed with Dr. Philipp Ovens

## 2020-12-28 NOTE — Progress Notes (Signed)
Internal Medicine Clinic Attending  Case discussed with Dr. Alfonse Spruce  At the time of the visit.  We reviewed the resident's history and exam and pertinent patient test results.  I agree with the assessment, diagnosis, and plan of care documented in the resident's note.   Patient here with dyspepsia, early satiety, and on going weight loss. She has a history of peptic duodenitis 2/2 to H Pylori treated in 2015. She had confirmatory eradication testing in 2018 with negative stool antigen. Given on going symptoms, agree with empiric PPI trial and referring to GI for repeat upper endoscopy.

## 2021-01-10 ENCOUNTER — Encounter: Payer: Self-pay | Admitting: *Deleted

## 2021-01-10 ENCOUNTER — Ambulatory Visit: Payer: 59 | Admitting: Behavioral Health

## 2021-01-11 ENCOUNTER — Ambulatory Visit: Payer: 59 | Admitting: Behavioral Health

## 2021-01-11 DIAGNOSIS — F331 Major depressive disorder, recurrent, moderate: Secondary | ICD-10-CM

## 2021-01-11 DIAGNOSIS — F419 Anxiety disorder, unspecified: Secondary | ICD-10-CM

## 2021-01-11 NOTE — BH Specialist Note (Signed)
Integrated Behavioral Health Follow Up In-Person Visit  MRN: [id number] Name: Heather [name]  Number of [place] Clinician visits: 3/6 Session Start time: 3:30pm  Session End time: 4:30pm Total time: 60 minutes  Types of Service: Individual psychotherapy  Interpretor:No. Interpretor Name and Language: n/a   Subjective: Heather [name] is a 50 y.o. female accompanied by self Patient was referred by Dr. Dareen Piano, MD for anx/dep, weight issues due to health status changes & stress. Patient reports the following symptoms/concerns: elevated anx/dep, concern for older Son's recent disclosure Duration of problem: one wk; Severity of problem: moderate to severe  Objective: Mood: Anxious and Depressed and Affect: Tearful Risk of harm to self or others: No plan to harm self or others  Life Context: Family and Social: Pt recently had older Son home on visit w/new relational interest School/Work: Pt works in [employer] @ [employer] [place] [employer] & does not attend school Self-Care: Pt is trying to regain control over her anxiety levels by using tools suggested; visualization of Mtn scene, music, & exercise when she can do this w/o getting SOB Life Changes: Pt is currently dealing w/weight loss that is extreme for her. She is addressing health status changes & feels everything is out of control.  Patient and/or Family's Strengths/Protective Factors: Social connections, Social and Emotional competence, Concrete supports in place (healthy food, safe environments, etc.) and Sense of purpose. Pt is trying to eat healthier & more, but early satiation occurs.  Goals Addressed: Patient will: 1.  Reduce symptoms of: anxiety, depression and stress  2.  Increase knowledge and/or ability of: coping skills, healthy habits and stress reduction  3.  Demonstrate ability to: Increase healthy adjustment to current life circumstances  Progress towards Goals: Ongoing  Interventions: Interventions utilized:   Solution-Focused Strategies, Supportive Counseling and Psychoeducation and/or Health Education Standardized Assessments completed: GAD-7 and PHQ 9  Patient and/or Family Response: Pt receptive to appt time today & requests future appt  Patient Centered Plan: Patient is on the following Treatment Plan(s): Ongoing use of relaxation strategies, compliance w/medical & psychotherapy appt.s Assessment: Patient currently experiencing elevated levels of anxiety & stress. Pt's oldest Son has recently changed how he identifies. Pt is trying to adjust to his news.  Patient may benefit from cont'd psychoedu re: diversity, self-identification in relationships, & improved self-care practices that support health.  Plan: 1. Follow up with behavioral health clinician on : Next available 60 min f:f 2. Behavioral recommendations: Cont using tools; self-talk cards, visualization, review of the day 3. Referral(s): None outside [facility] at this time 4. "From scale of 1-10, how likely are you to follow plan?": [place], LMFT

## 2021-01-12 ENCOUNTER — Telehealth: Payer: Self-pay | Admitting: Behavioral Health

## 2021-01-12 NOTE — Telephone Encounter (Signed)
Contacted Pt w/Physician request for Pt to make an appt w/him to discuss medication choices for anx/dep.  Pt agreed to make an appt.  Dr. Theodis Shove

## 2021-01-13 ENCOUNTER — Encounter: Payer: Self-pay | Admitting: Gastroenterology

## 2021-01-13 ENCOUNTER — Telehealth: Payer: Self-pay

## 2021-01-13 NOTE — Telephone Encounter (Signed)
Donnetta Hutching, LMFT  [place], Orvis Brill, RN; [place], [place], RN Saw this Pt today & she is unsure where in the process her Referral to [facility] is-can either of you help her?   TY!   Dr. Viona Gilmore-     TC to patient, she was informed that [facility] clinical manager mailed out a letter to her on 01/10/21 regarding GI referral.  Per letter, [facility] has not been able to contact her to set up her appointment.  Patient asked to call [facility] directly to schedule, phone number provided. SChaplin, RN,BSN

## 2021-01-18 ENCOUNTER — Encounter: Payer: Self-pay | Admitting: *Deleted

## 2021-01-25 DIAGNOSIS — C3490 Malignant neoplasm of unspecified part of unspecified bronchus or lung: Secondary | ICD-10-CM | POA: Diagnosis not present

## 2021-01-25 DIAGNOSIS — Z902 Acquired absence of lung [part of]: Secondary | ICD-10-CM | POA: Diagnosis not present

## 2021-02-03 ENCOUNTER — Ambulatory Visit: Payer: 59 | Admitting: Behavioral Health

## 2021-02-08 ENCOUNTER — Encounter: Payer: 59 | Admitting: Internal Medicine

## 2021-02-15 ENCOUNTER — Ambulatory Visit (INDEPENDENT_AMBULATORY_CARE_PROVIDER_SITE_OTHER): Payer: 59 | Admitting: Internal Medicine

## 2021-02-15 ENCOUNTER — Other Ambulatory Visit (HOSPITAL_COMMUNITY): Payer: Self-pay

## 2021-02-15 VITALS — BP 122/66 | HR 82 | Temp 98.3°F | Ht 63.0 in | Wt 104.4 lb

## 2021-02-15 DIAGNOSIS — R634 Abnormal weight loss: Secondary | ICD-10-CM | POA: Diagnosis not present

## 2021-02-15 DIAGNOSIS — F17200 Nicotine dependence, unspecified, uncomplicated: Secondary | ICD-10-CM

## 2021-02-15 DIAGNOSIS — Z0000 Encounter for general adult medical examination without abnormal findings: Secondary | ICD-10-CM | POA: Diagnosis not present

## 2021-02-15 DIAGNOSIS — Z902 Acquired absence of lung [part of]: Secondary | ICD-10-CM

## 2021-02-15 DIAGNOSIS — F32A Depression, unspecified: Secondary | ICD-10-CM

## 2021-02-15 DIAGNOSIS — G4700 Insomnia, unspecified: Secondary | ICD-10-CM

## 2021-02-15 DIAGNOSIS — Z862 Personal history of diseases of the blood and blood-forming organs and certain disorders involving the immune mechanism: Secondary | ICD-10-CM

## 2021-02-15 DIAGNOSIS — R042 Hemoptysis: Secondary | ICD-10-CM

## 2021-02-15 MED ORDER — MIRTAZAPINE 15 MG PO TABS
15.0000 mg | ORAL_TABLET | Freq: Every day | ORAL | 2 refills | Status: DC
  Filled 2021-02-15: qty 30, 30d supply, fill #0
  Filled 2021-03-15: qty 30, 30d supply, fill #1
  Filled 2021-04-15: qty 30, 30d supply, fill #2

## 2021-02-15 MED ORDER — ALBUTEROL SULFATE HFA 108 (90 BASE) MCG/ACT IN AERS
2.0000 | INHALATION_SPRAY | Freq: Four times a day (QID) | RESPIRATORY_TRACT | 2 refills | Status: DC | PRN
  Filled 2021-02-15: qty 8.5, 25d supply, fill #0

## 2021-02-15 NOTE — Patient Instructions (Addendum)
-   It was a pleasure seeing you today -Given your persistent cough and intermittent wheezing as well as her shortness of breath I am concerned for possible COPD.  I have put in a referral for lung function tests.  They should call you to make an appointment -I have also prescribed albuterol as needed for your shortness of breath and wheezing -Please follow-up with oncology for your history of lung cancer. You will need to call them to make an appointment -We will check some blood work on you today including your kidney and liver function tests, blood counts, thyroid level as well as your cholesterol panel -I have also prescribed Remeron for depression and sleep.  Please let me know if you have any issues obtaining this medication or if you have any side effects from this medication -Please follow-up with your gastroenterologist on Thursday for the weight loss and decreased appetite -Please call me for any questions or concerns or if you need any refills

## 2021-02-15 NOTE — Assessment & Plan Note (Signed)
-   Patient has a history of stage Ia adenocarcinoma of her lung status post right upper lobectomy November 2018 -Patient did have CTA chest done in October of last year to rule out PE which did not show any recurrence of malignancy -However, patient has not followed up with oncology in a while and needs to make an appointment for follow-up -I discussed this with the patient today and she states that she will call the oncologist office for follow-up appointment

## 2021-02-15 NOTE — Assessment & Plan Note (Addendum)
-   Patient has gotten both doses of her COVID-vaccine.  I have encouraged her to get her COVID booster especially given her underlying risk factors -Patient is due for a Pap smear but on prior attempts in [facility] she was unable to do this as it was too painful.  She states that she will find a gynecologist and follow-up with her for the Pap smear -We we will screen for hepatitis C today -Patient is due for lipid panel as well given that she is over 50.  We will take this for her today

## 2021-02-15 NOTE — Assessment & Plan Note (Signed)
-   Patient reports continued issues with sleep and states that the trazodone is normal longer effective -I suspect that her difficulty sleeping is secondary to underlying depression and anxiety -We will DC trazodone at this time -We will start the patient on Remeron for her depression/anxiety.  This should help with her insomnia as well -We will have the patient follow-up in 3 months

## 2021-02-15 NOTE — Assessment & Plan Note (Signed)
-   Patient complains of persistent cough since her last visit and notes that it gets worse with exertion.  She also notes some associated wheezing. -She denies any hemoptysis today -She also states that her O2 sats drop with exertion and this has been persistent over the last couple of months (was present at her last appointment in October 2021) -Patient did have a CTA chest for this in October which showed no PE or any other acute abnormality -I am concerned for possible COPD as a cause for her dyspnea on exertion with associated cough and wheezing given her extensive smoking history -We will check PFTs on her -I have started her on albuterol as needed to see if this will help with her symptoms -We will follow-up results

## 2021-02-15 NOTE — Assessment & Plan Note (Signed)
-   Patient's weight is up to 104 pounds today from 99 pounds at last visit -Patient states that this is secondary to her eating a lot over the last couple of days because her appointment is coming up and she did not want to have further weight loss -Patient still complains of early satiety and decreased oral intake -She has an appointment to follow-up with GI on Thursday (May 19) -She did have a history of H. pylori gastritis which was treated and had an endoscopy in 2015 which showed duodenitis with no celiac disease -No further work-up at this time.  I have started her on Remeron for her depression/anxiety and hope that this will help with her appetite as well

## 2021-02-15 NOTE — Assessment & Plan Note (Signed)
-   Patient states that she is quit smoking but still has an occasional cigarette when she gets stressed.  She states that this is infrequent and is usually less than 1 to 2-week -Patient encouraged to continue to remain abstinent from smoking

## 2021-02-15 NOTE — Progress Notes (Signed)
   Subjective:    Patient ID: Heather [name], female    DOB: [birth year], 50 y.o.   MRN: [id number]  HPI  I have seen and examined this patient.  Patient is here for routine follow-up of her depression and weight loss.  Patient states that she has been having difficulty sleeping even on the trazodone and notes persistent early satiety and decreased appetite.  She also complains of increased anxiety and states that she "has a lot going on".  Patient states that she takes her PPI as needed and also uses trazodone intermittently to help with sleep but this has not been effective recently.  Patient also has persistent cough and dyspnea on exertion as well as associated wheezing. Patient denies any other complaints at this time.   Review of Systems  Constitutional: Positive for unexpected weight change.       Her weight is up approximately 5 pounds since her last visit but she states that this is secondary to her eating a lot over the last couple days knowing her appointment was today  HENT: Negative.   Respiratory: Positive for cough, shortness of breath and wheezing.        Patient complains of dyspnea on exertion with associated cough and wheezing  Cardiovascular: Negative.   Gastrointestinal:       Patient complains of early satiety and weight loss  Musculoskeletal: Negative.   Neurological: Negative.   Psychiatric/Behavioral:       Patient with increased anxiety and depression       Objective:   Physical Exam HENT:     Head: Normocephalic and atraumatic.  Cardiovascular:     Rate and Rhythm: Normal rate and regular rhythm.     Heart sounds: Normal heart sounds.  Pulmonary:     Effort: Pulmonary effort is normal.     Breath sounds: Normal breath sounds. No wheezing or rales.  Abdominal:     General: Bowel sounds are normal. There is no distension.     Palpations: Abdomen is soft.     Tenderness: There is no abdominal tenderness.  Musculoskeletal:        General: No swelling  or tenderness.     Cervical back: Neck supple.  Lymphadenopathy:     Cervical: No cervical adenopathy.  Neurological:     Mental Status: She is alert and oriented to person, place, and time.  Psychiatric:     Comments: Patient appears anxious           Assessment & Plan:  Please see problem based charting for assessment and plan:

## 2021-02-15 NOTE — Assessment & Plan Note (Signed)
-   Patient does have a history of major depression and anxiety and follows up with Dr. Carolynne Edouard (behavioral health at [facility]) for this -Patient's PHQ-9 score is elevated at 15 today -Patient states that she been keeping up with appointments with Dr. Carolynne Edouard but missed her last 1 secondary to a death in the family -Encouraged her to continue to follow-up with Dr. Carolynne Edouard for now -I had discussed this with Dr. Carolynne Edouard previously and we agreed that she would probably benefit from medication for this -I discussed the different options for the patient including an SSRI, Remeron or Wellbutrin.  Given the patient has a history of weight loss and difficulty sleeping as well as depression we opted to start her on Remeron at this time. -We will start the patient on Remeron 15 mg nightly to see if this will help with some of her symptoms -Patient to follow-up in 3 months

## 2021-02-15 NOTE — Assessment & Plan Note (Signed)
-   Patient does have a history of anemia and now has worsening dyspnea on exertion.  Although I suspect the dyspnea on exertion is possibly secondary to COPD will need to rule out other etiologies as well -We will check a CBC today -No further work-up at this time

## 2021-02-16 ENCOUNTER — Telehealth: Payer: Self-pay | Admitting: Internal Medicine

## 2021-02-16 ENCOUNTER — Other Ambulatory Visit (HOSPITAL_COMMUNITY): Payer: Self-pay

## 2021-02-16 LAB — CMP14 + ANION GAP
ALT: 8 IU/L (ref 0–32)
AST: 17 IU/L (ref 0–40)
Albumin/Globulin Ratio: 2.1 (ref 1.2–2.2)
Albumin: 4.9 g/dL (ref 3.8–4.9)
Alkaline Phosphatase: 65 IU/L (ref 44–121)
Anion Gap: 19 mmol/L — ABNORMAL HIGH (ref 10.0–18.0)
BUN/Creatinine Ratio: 16 (ref 9–23)
BUN: 11 mg/dL (ref 6–24)
Bilirubin Total: 0.3 mg/dL (ref 0.0–1.2)
CO2: 20 mmol/L (ref 20–29)
Calcium: 9.4 mg/dL (ref 8.7–10.2)
Chloride: 101 mmol/L (ref 96–106)
Creatinine, Ser: 0.7 mg/dL (ref 0.57–1.00)
Globulin, Total: 2.3 g/dL (ref 1.5–4.5)
Glucose: 86 mg/dL (ref 65–99)
Potassium: 4.6 mmol/L (ref 3.5–5.2)
Sodium: 140 mmol/L (ref 134–144)
Total Protein: 7.2 g/dL (ref 6.0–8.5)
eGFR: 105 mL/min/{1.73_m2} (ref >59)

## 2021-02-16 LAB — CBC WITH DIFFERENTIAL/PLATELET
Basophils Absolute: 0.1 10*3/uL (ref 0.0–0.2)
Basos: 1 %
EOS (ABSOLUTE): 0.1 10*3/uL (ref 0.0–0.4)
Eos: 1 %
Hematocrit: 39.7 % (ref 34.0–46.6)
Hemoglobin: 13.5 g/dL (ref 11.1–15.9)
Immature Grans (Abs): 0 10*3/uL (ref 0.0–0.1)
Immature Granulocytes: 0 %
Lymphocytes Absolute: 1.4 10*3/uL (ref 0.7–3.1)
Lymphs: 20 %
MCH: 31.5 pg (ref 26.6–33.0)
MCHC: 34 g/dL (ref 31.5–35.7)
MCV: 93 fL (ref 79–97)
Monocytes Absolute: 0.4 10*3/uL (ref 0.1–0.9)
Monocytes: 6 %
Neutrophils Absolute: 5 10*3/uL (ref 1.4–7.0)
Neutrophils: 72 %
Platelets: 125 10*3/uL — ABNORMAL LOW (ref 150–450)
RBC: 4.29 x10E6/uL (ref 3.77–5.28)
RDW: 13.4 % (ref 11.7–15.4)
WBC: 7 10*3/uL (ref 3.4–10.8)

## 2021-02-16 LAB — LIPID PANEL
Chol/HDL Ratio: 2.4 ratio (ref 0.0–4.4)
Cholesterol, Total: 173 mg/dL (ref 100–199)
HDL: 71 mg/dL (ref >39)
LDL Chol Calc (NIH): 92 mg/dL (ref 0–99)
Triglycerides: 52 mg/dL (ref 0–149)
VLDL Cholesterol Cal: 10 mg/dL (ref 5–40)

## 2021-02-16 LAB — HEPATITIS C ANTIBODY: Hep C Virus Ab: <0.1 s/co ratio (ref 0.0–0.9)

## 2021-02-16 LAB — TSH: TSH: 1.67 u[IU]/mL (ref 0.450–4.500)

## 2021-02-16 NOTE — Telephone Encounter (Signed)
I called the patient discussed the results of her blood work with her.  Patient noted to have a normal BMP except for mildly elevated anion gap.  Patient also noted to have a normal CBC, a negative hepatitis C antibody and a normal TSH.  Her 10-year ASCVD risk score was only 2.1%.  No indication for initiation of statin at this time.  Patient states that she will pick up her Remeron today and inform us if she has any issues with this medication.  No further work-up at this time.  Patient expresses understanding and is in agreement with plan.Heather [name]

## 2021-02-17 ENCOUNTER — Ambulatory Visit (INDEPENDENT_AMBULATORY_CARE_PROVIDER_SITE_OTHER): Payer: 59 | Admitting: Gastroenterology

## 2021-02-17 ENCOUNTER — Other Ambulatory Visit (HOSPITAL_COMMUNITY): Payer: Self-pay

## 2021-02-17 ENCOUNTER — Encounter: Payer: Self-pay | Admitting: Gastroenterology

## 2021-02-17 VITALS — BP 100/62 | HR 60 | Ht 63.0 in | Wt 102.4 lb

## 2021-02-17 DIAGNOSIS — R1010 Upper abdominal pain, unspecified: Secondary | ICD-10-CM

## 2021-02-17 DIAGNOSIS — R634 Abnormal weight loss: Secondary | ICD-10-CM | POA: Diagnosis not present

## 2021-02-17 DIAGNOSIS — R6881 Early satiety: Secondary | ICD-10-CM

## 2021-02-17 DIAGNOSIS — R140 Abdominal distension (gaseous): Secondary | ICD-10-CM | POA: Diagnosis not present

## 2021-02-17 DIAGNOSIS — R110 Nausea: Secondary | ICD-10-CM

## 2021-02-17 MED ORDER — METRONIDAZOLE 250 MG PO TABS
250.0000 mg | ORAL_TABLET | Freq: Three times a day (TID) | ORAL | 0 refills | Status: AC
  Filled 2021-02-17: qty 42, 14d supply, fill #0

## 2021-02-17 MED ORDER — ONDANSETRON HCL 4 MG PO TABS
4.0000 mg | ORAL_TABLET | Freq: Three times a day (TID) | ORAL | 1 refills | Status: DC | PRN
  Filled 2021-02-17: qty 30, 10d supply, fill #0

## 2021-02-17 MED ORDER — PEG 3350-KCL-NABCB-NACL-NASULF 236 G PO SOLR
4000.0000 mL | Freq: Once | ORAL | 0 refills | Status: AC
  Filled 2021-02-17: qty 4000, 1d supply, fill #0

## 2021-02-17 MED ORDER — CEPHALEXIN 250 MG PO CAPS
250.0000 mg | ORAL_CAPSULE | Freq: Three times a day (TID) | ORAL | 0 refills | Status: AC
  Filled 2021-02-17: qty 42, 14d supply, fill #0

## 2021-02-17 NOTE — Progress Notes (Signed)
[facility] Consult Note:  History: Heather [name] 02/17/2021  Referring provider: Aldine Contes, MD  Reason for consult/chief complaint: Dyspepsia (Mainly at night, for a couple of months) and Weight Loss (Several months. She states that she will real hungry and start eating only taking a few bites, and then feel like she is about to pop. Her weight has been up and down, but in the last little while she had dropped 20 pounds and was down to 96 pounds, she has currently gotten back up to 102 pounds today.)   Subjective  HPI:  [name] was seen in clinic July 2015 by our PA (supervised by Dr. Olevia Perches) describing very similar symptoms including weight loss.  Work-up included CT scan, labs and EGD colonoscopy in August 2015. EGD with mild gastritis, biopsies positive for H. pylori and treated with amoxicillin and clarithromycin.  Biopsy reports as below. Colonoscopy with fair preparation, multiple adenomatous and hyperplastic polyps removed.  She is here today reporting several months of upper digestive symptoms with early satiety, nausea, bloating and postprandial abdominal distention.  She says after just taking a few bites she has visible abdominal distention and feels "about pop".  Her weight apparently varies but in the last several months she believes she has lost 20 pounds, has recently put several pounds back on.  She has been struggling with insomnia and anxiety which primary care has been treating, she recently started Remeron. She denies dysphagia or odynophagia.  Symptoms seem to bother her more at night.  Bowel habits fairly regular without rectal bleeding.  However, if she eats more than just a little bit, she will have urgency and loose stool.  Reviewing the clinic consult note from 2015, her reported symptoms today are nearly identical to how they were described at that time. Tram cannot recall the details of that time, and suspects that her digestive  symptoms must of improved to some degree afterward, but that "my stomach is never good".  Things seem to have worsened in the last several months for unclear reasons ROS:  Review of Systems  Constitutional: Positive for fatigue. Negative for appetite change and unexpected weight change.  HENT: Negative for mouth sores and voice change.   Eyes: Negative for pain and redness.  Respiratory: Positive for choking and shortness of breath. Negative for cough.        Intermittent hemoptysis, which she recently brought to her primary care provider's attention  Cardiovascular: Negative for chest pain and palpitations.  Genitourinary: Negative for dysuria and hematuria.  Musculoskeletal: Negative for arthralgias and myalgias.  Skin: Negative for pallor and rash.  Neurological: Positive for headaches. Negative for weakness.       Insomnia  Hematological: Negative for adenopathy.  Psychiatric/Behavioral:       Anxiety and depression     Past Medical History: Past Medical History:  Diagnosis Date  . Blood dyscrasia   . COPD (chronic obstructive pulmonary disease) ([place])   . Current smoker   . Difficult intubation    pt was told difficult intubation at [facility]  . 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 Headache    "weekly" (09/04/2017)  . Heart murmur   . Hypokalemia 10/15/2017  . Lung cancer ([place]) dx'd 08/2017  . Pleuritic chest pain 06/13/2018  . Pre-diabetes   . Pulmonary nodule 07/16/2017  . Rapid resting heart rate    happens infrequently  . Rib pain on right side 12/21/2017   Recent primary care  notes were reviewed indicating these GI symptoms as well as patient's ongoing insomnia and anxiety.  Right upper lobectomy November 2018 for early stage adenocarcinoma.  CTA chest October 2021 without PE or recurrence of malignancy.  (Report reviewed)  Past Surgical History: Past Surgical History:  Procedure Laterality Date  . CESAREAN SECTION  1992; 2012  . COLONOSCOPY  N/A 05/15/2014   Procedure: COLONOSCOPY;  Surgeon: Lafayette Dragon, MD;  Location: [facility];  Service: Endoscopy;  Laterality: N/A;  . ESOPHAGOGASTRODUODENOSCOPY N/A 05/15/2014   Procedure: ESOPHAGOGASTRODUODENOSCOPY (EGD);  Surgeon: Lafayette Dragon, MD;  Location: [facility];  Service: Endoscopy;  Laterality: N/A;  . LAPAROSCOPY  07/07/2011   Procedure: LAPAROSCOPY DIAGNOSTIC;  Surgeon: Elveria Royals;  Location: [facility];  Service: Gynecology;  Laterality: N/A;  . LESION REMOVAL  07/07/2011   Procedure: EXCISION VAGINAL LESION;  Surgeon: Elveria Royals;  Location: [facility];  Service: Gynecology;  Laterality: N/A;  . LOBECTOMY Right 08/20/2017   Procedure: RIGHT UPPER LOBE LOBECTOMY;  Surgeon: [name], MD;  Location: [facility];  Service: Thoracic;  Laterality: Right;  . LYMPH NODE DISSECTION N/A 08/20/2017   Procedure: LYMPH NODE DISSECTION;  Surgeon: [name], MD;  Location: [place];  Service: Thoracic;  Laterality: N/A;  . VAGINAL HYSTERECTOMY  ~ 2014  . VIDEO ASSISTED THORACOSCOPY (VATS)/WEDGE RESECTION Right 08/20/2017   Procedure: RIGHT VIDEO ASSISTED THORACOSCOPY (VATS)/WEDGE RESECTION;  Surgeon: [name], MD;  Location: [facility];  Service: Thoracic;  Laterality: Right;     Family History: Family History  Problem Relation Age of Onset  . Breast cancer Sister 88  . Colon polyps Father   . Colon cancer Paternal Uncle   . Colon polyps Paternal Uncle   . Stomach cancer Maternal Grandmother     Social History: Social History   Socioeconomic History  . Marital status: Divorced    Spouse name: Not on file  . Number of children: Not on file  . Years of education: Not on file  . Highest education level: Not on file  Occupational History  . Not on file  Tobacco Use  . Smoking status: Current Some Day Smoker    Packs/day: 0.75    Years: 34.00    Pack years: 25.50    Types: Cigarettes    Last attempt to quit: 08/17/2017    Years since quitting: 3.5   . Smokeless tobacco: Never Used  . Tobacco comment: 1 cigarette per week currently  Vaping Use  . Vaping Use: Never used  Substance and Sexual Activity  . Alcohol use: Not Currently    Alcohol/week: 0.0 standard drinks    Comment: 09/04/2017 "a mixed drink a few times/year"  . Drug use: No  . Sexual activity: Not Currently  Other Topics Concern  . Not on file  Social History Narrative  . Not on file   Social Determinants of Health   Financial Resource Strain: Not on file  Food Insecurity: Not on file  Transportation Needs: Not on file  Physical Activity: Not on file  Stress: Not on file  Social Connections: Not on file    Allergies: Allergies  Allergen Reactions  . Iohexol Shortness Of Breath and Swelling    Patient indicates she had swelling all over her body as well as shortness of breath.  Even when she was premedicated, she broke out in a full body rash.  . Aspirin Nausea And Vomiting    Outpatient Meds: Current Outpatient Medications  Medication Sig  Dispense Refill  . albuterol (PROVENTIL HFA) 108 (90 Base) MCG/ACT inhaler Inhale 2 puffs into the lungs every 6 (six) hours as needed for wheezing or shortness of breath. 8.5 g 2  . mirtazapine (REMERON) 15 MG tablet Take 1 tablet (15 mg total) by mouth at bedtime. 30 tablet 2  . pantoprazole (PROTONIX) 40 MG tablet TAKE 1 TABLET (40 MG TOTAL) BY MOUTH DAILY. 30 tablet 1  . polyethylene glycol (GOLYTELY) 236 g solution Take 4,000 mLs by mouth once for 1 dose. 4000 mL 0   No current facility-administered medications for this visit.      ___________________________________________________________________ Objective   Exam:  BP 100/62   Pulse 60   Ht 5\' 3"  (1.6 m)   Wt 102 lb 6.4 oz (46.4 kg)   BMI 18.14 kg/m  Wt Readings from Last 3 Encounters:  02/17/21 102 lb 6.4 oz (46.4 kg)  02/15/21 104 lb 6.4 oz (47.4 kg)  12/27/20 99 lb 3.2 oz (45 kg)     General: Chronically ill-appearing woman with generalized  poor muscle mass.  Eyes: sclera anicteric, no redness  ENT: oral mucosa moist without lesions, no cervical or supraclavicular lymphadenopathy, poor dentition  CV: RRR without murmur, S1/S2, no JVD, no peripheral edema  Resp: clear to auscultation bilaterally, normal RR and effort noted  GI: soft, mild upper tenderness, with active bowel sounds. No guarding or palpable organomegaly noted.  Skin; warm and dry, no rash or jaundice noted  Neuro: awake, alert and oriented x 3. Normal gross motor function and fluent speech  Labs:  CBC Latest Ref Rng & Units 02/15/2021 03/25/2020 03/25/2020  WBC 3.4 - 10.8 x10E3/uL 7.0 7.1 7.9  Hemoglobin 11.1 - 15.9 g/dL 13.5 13.8 14.7  Hematocrit 34.0 - 46.6 % 39.7 41.5 44.5  Platelets 150 - 450 x10E3/uL 125(L) 133(L) 147(L)   (Low platelets for years)  CMP Latest Ref Rng & Units 02/15/2021 07/22/2020 03/25/2020  Glucose 65 - 99 mg/dL 86 - 93  BUN 6 - 24 mg/dL 11 - 9  Creatinine 0.57 - 1.00 mg/dL 0.70 0.70 0.73  Sodium 134 - 144 mmol/L 140 - 139  Potassium 3.5 - 5.2 mmol/L 4.6 - 4.0  Chloride 96 - 106 mmol/L 101 - 103  CO2 20 - 29 mmol/L 20 - 25  Calcium 8.7 - 10.2 mg/dL 9.4 - 9.6  Total Protein 6.0 - 8.5 g/dL 7.2 - -  Total Bilirubin 0.0 - 1.2 mg/dL 0.3 - -  Alkaline Phos 44 - 121 IU/L 65 - -  AST 0 - 40 IU/L 17 - -  ALT 0 - 32 IU/L 8 - -  Albumin 4.9  Recent normal TSH ____________________ August 2015 endoscopic pathology reports  1. Small Intestine Biopsy, r/o celiac disease - FINDINGS SUGGESTIVE OF PEPTID DUODENITIS, SEE COMMENT. - NO INCREASE IN INTRAEPITHELIAL LYMPHOCYTES. - NO DYSPLASIA OR MALIGNANCY. 2. Stomach, biopsy, r/o Helicobacter - MILD CHRONIC GASTRITIS WITH RARE HELICOBACTER PYLORI ORGANISMS. - NO INTESTINAL METAPLASIA, DYSPLASIA, OR MALIGNANCY. - SEE COMMENT. 3. Esophagogastric junction, biopsy, r/o barretts esophagus - GASTROESOPHAGEAL MUCOSA WITH CHRONIC INFLAMMATION. - NO INTESTINAL METAPLASIA, DYSPLASIA, OR  MALIGNANCY. 4. Colon, biopsy, cecal polyp; r/o neoplasia - TUBULAR ADENOMA. - NO HIGH GRADE DYSPLASIA OR MALIGNANCY. 5. Colon, polyp(s), descending polyp; r/o neoplasia both removed cold snare (je) - TUBULAR ADENOMA. - HYPERPLASTIC POLYP. - NO HIGH GRADE DYSPLASIA OR MALIGNANCY.   H. pylori eradication confirmed with negative stool antigen in 2018.  Assessment: Encounter Diagnoses  Name Primary?  . Pain  of upper abdomen Yes  . Abdominal bloating   . Weight loss   . Early satiety   . Nausea in adult     Her symptoms are difficult to characterize, therefore a broad differential.  H. pylori is eradicated, unlikely to have contracted it again.  Visible distention suggest either partial obstruction, SIBO or functional disorder.  Bowel habit abnormality, though seems ancillary to her upper digestive symptoms. Less likely neoplasia despite previous polyp history.  I question the accuracy of the reported chronicity and weight loss given her previous history and normal albumin.  Plan:  As needed Zofran, which she can also take to help keep down her bowel preparation.  Upper endoscopy and colonoscopy.  Must be done in hospital endoscopy lab due to reported history of difficult intubation.  This will be in July with our current schedule availability. She was agreeable after discussion of procedure and risks.  The benefits and risks of the planned procedure were described in detail with the patient or (when appropriate) their health care proxy.  Risks were outlined as including, but not limited to, bleeding, infection, perforation, adverse medication reaction leading to cardiac or pulmonary decompensation, pancreatitis (if ERCP).  The limitation of incomplete mucosal visualization was also discussed.  No guarantees or warranties were given.  Empiric therapy for possible SIBO with a 2-week course of metronidazole and cephalexin  CT abdomen and pelvis with oral contrast only (this limits exam  somewhat, but patient has reported IV dye allergy).  Thank you for the courtesy of this consult.  Please call me with any questions or concerns.  [name]  CC: Referring provider noted above

## 2021-02-17 NOTE — Patient Instructions (Addendum)
If you are age 51 or older, your body mass index should be between 23-30. Your Body mass index is 18.14 kg/m. If this is out of the aforementioned range listed, please consider follow up with your Primary Care Provider.  If you are age 71 or younger, your body mass index should be between 19-25. Your Body mass index is 18.14 kg/m. If this is out of the aformentioned range listed, please consider follow up with your Primary Care Provider.   You have been scheduled for an endoscopy and colonoscopy. Please follow the written instructions given to you at your visit today. Please pick up your prep supplies at the pharmacy within the next 1-3 days. If you use inhalers (even only as needed), please bring them with you on the day of your procedure.  You have been scheduled for a CT scan of the abdomen and pelvis at [facility], 1st floor Radiology. You are scheduled on 02-25-2021  at [place] should arrive 15 minutes prior to your appointment time for registration.  Please pick up 2 bottles of contrast from [place] at least 3 days prior to your scan. The solution may taste better if refrigerated, but do NOT add ice or any other liquid to this solution. Shake well before drinking.   Please follow the written instructions below on the day of your exam:   1) Do not eat anything after  (4 hours prior to your test)   2) Drink 1 bottle of contrast @ 8am (2 hours prior to your exam)  Remember to shake well before drinking and do NOT pour over ice.     Drink 1 bottle of contrast @ 7am (1 hour prior to your exam)   You may take any medications as prescribed with a small amount of water, if necessary. If you take any of the following medications: METFORMIN, GLUCOPHAGE, GLUCOVANCE, AVANDAMET, RIOMET, FORTAMET, Mona MET, JANUMET, GLUMETZA or METAGLIP, you MAY be asked to HOLD this medication 48 hours AFTER the exam.   The purpose of you drinking the oral contrast is to aid in the visualization of  your intestinal tract. The contrast solution may cause some diarrhea. Depending on your individual set of symptoms, you may also receive an intravenous injection of x-ray contrast/dye. Plan on being at [facility] for 45 minutes or longer, depending on the type of exam you are having performed.   If you have any questions regarding your exam or if you need to reschedule, you may call [facility] at [phone] between the hours of 8:00 am and 5:00 pm, Monday-Friday.   It was a pleasure to see you today!  Thank you for trusting me with your gastrointestinal care!

## 2021-02-18 ENCOUNTER — Other Ambulatory Visit (HOSPITAL_COMMUNITY): Payer: Self-pay

## 2021-02-24 DIAGNOSIS — C3490 Malignant neoplasm of unspecified part of unspecified bronchus or lung: Secondary | ICD-10-CM | POA: Diagnosis not present

## 2021-02-24 DIAGNOSIS — Z902 Acquired absence of lung [part of]: Secondary | ICD-10-CM | POA: Diagnosis not present

## 2021-02-25 ENCOUNTER — Ambulatory Visit (HOSPITAL_COMMUNITY): Payer: 59

## 2021-02-25 ENCOUNTER — Telehealth: Payer: Self-pay

## 2021-02-25 NOTE — Telephone Encounter (Signed)
Lm on vm for patient to return call to offer her a sooner appt on 03/03/21 arriving at [facility] at 8 AM.

## 2021-03-01 NOTE — Telephone Encounter (Signed)
Lm on vm for patient to return call in regards to moving her appt sooner at [facility].

## 2021-03-02 NOTE — Telephone Encounter (Signed)
Spoke with patient, she states that she has been attempting to return call. She states that she already has an appt tomorrow and will not be able to make it. Patient is open to a sooner appt if any other become available.

## 2021-03-03 ENCOUNTER — Ambulatory Visit (HOSPITAL_COMMUNITY)
Admission: RE | Admit: 2021-03-03 | Discharge: 2021-03-03 | Disposition: A | Discharge status: Home / Self Care | Payer: 59 | Source: Ambulatory Visit | Attending: Gastroenterology | Admitting: Gastroenterology

## 2021-03-03 ENCOUNTER — Other Ambulatory Visit: Payer: Self-pay

## 2021-03-03 DIAGNOSIS — D7389 Other diseases of spleen: Secondary | ICD-10-CM | POA: Diagnosis not present

## 2021-03-03 DIAGNOSIS — R634 Abnormal weight loss: Secondary | ICD-10-CM | POA: Diagnosis not present

## 2021-03-03 DIAGNOSIS — R6881 Early satiety: Secondary | ICD-10-CM | POA: Insufficient documentation

## 2021-03-03 DIAGNOSIS — R1010 Upper abdominal pain, unspecified: Secondary | ICD-10-CM | POA: Diagnosis not present

## 2021-03-03 DIAGNOSIS — R140 Abdominal distension (gaseous): Secondary | ICD-10-CM | POA: Insufficient documentation

## 2021-03-03 DIAGNOSIS — R110 Nausea: Secondary | ICD-10-CM | POA: Diagnosis not present

## 2021-03-15 ENCOUNTER — Other Ambulatory Visit (HOSPITAL_COMMUNITY): Payer: Self-pay

## 2021-03-17 ENCOUNTER — Encounter: Payer: Self-pay | Admitting: Internal Medicine

## 2021-03-22 ENCOUNTER — Telehealth: Payer: Self-pay

## 2021-03-22 NOTE — Telephone Encounter (Signed)
Spoke with patient to offer her a sooner appt for her procedures at [facility]. Patient states that her care partner just had leg surgery so she will have to pass on the offer.

## 2021-03-27 DIAGNOSIS — C3490 Malignant neoplasm of unspecified part of unspecified bronchus or lung: Secondary | ICD-10-CM | POA: Diagnosis not present

## 2021-03-27 DIAGNOSIS — Z902 Acquired absence of lung [part of]: Secondary | ICD-10-CM | POA: Diagnosis not present

## 2021-03-29 ENCOUNTER — Other Ambulatory Visit (HOSPITAL_COMMUNITY)
Admission: RE | Admit: 2021-03-29 | Discharge: 2021-03-29 | Disposition: A | Discharge status: Home / Self Care | Payer: 59 | Source: Ambulatory Visit | Attending: Internal Medicine | Admitting: Internal Medicine

## 2021-03-29 DIAGNOSIS — Z01812 Encounter for preprocedural laboratory examination: Secondary | ICD-10-CM | POA: Diagnosis not present

## 2021-03-29 DIAGNOSIS — Z20822 Contact with and (suspected) exposure to covid-19: Secondary | ICD-10-CM | POA: Diagnosis not present

## 2021-03-29 LAB — SARS CORONAVIRUS 2 (TAT 6-24 HRS): SARS Coronavirus 2: NEGATIVE

## 2021-03-30 ENCOUNTER — Other Ambulatory Visit: Payer: Self-pay

## 2021-03-30 ENCOUNTER — Ambulatory Visit (HOSPITAL_COMMUNITY)
Admission: RE | Admit: 2021-03-30 | Discharge: 2021-03-30 | Disposition: A | Discharge status: Home / Self Care | Payer: 59 | Source: Ambulatory Visit | Attending: Internal Medicine | Admitting: Internal Medicine

## 2021-03-30 DIAGNOSIS — R042 Hemoptysis: Secondary | ICD-10-CM | POA: Insufficient documentation

## 2021-03-30 LAB — PULMONARY FUNCTION TEST
DL/VA % pred: 80 %
DL/VA: 3.49 ml/min/mmHg/L
DLCO unc % pred: 65 %
DLCO unc: 13.32 ml/min/mmHg
FEF 25-75 Post: 1.14 L/sec
FEF 25-75 Pre: 1.43 L/sec
FEF2575-%Change-Post: -20 %
FEF2575-%Pred-Post: 42 %
FEF2575-%Pred-Pre: 53 %
FEV1-%Change-Post: 0 %
FEV1-%Pred-Post: 69 %
FEV1-%Pred-Pre: 69 %
FEV1-Post: 1.86 L
FEV1-Pre: 1.87 L
FEV1FVC-%Change-Post: 1 %
FEV1FVC-%Pred-Pre: 84 %
FEV6-%Change-Post: -2 %
FEV6-%Pred-Post: 82 %
FEV6-%Pred-Pre: 83 %
FEV6-Post: 2.71 L
FEV6-Pre: 2.77 L
FEV6FVC-%Pred-Post: 102 %
FEV6FVC-%Pred-Pre: 102 %
FVC-%Change-Post: -2 %
FVC-%Pred-Post: 79 %
FVC-%Pred-Pre: 81 %
FVC-Post: 2.71 L
FVC-Pre: 2.77 L
Post FEV1/FVC ratio: 68 %
Post FEV6/FVC ratio: 100 %
Pre FEV1/FVC ratio: 67 %
Pre FEV6/FVC Ratio: 100 %
RV % pred: 156 %
RV: 2.75 L
TLC % pred: 113 %
TLC: 5.55 L

## 2021-03-30 MED ORDER — ALBUTEROL SULFATE (2.5 MG/3ML) 0.083% IN NEBU
2.5000 mg | INHALATION_SOLUTION | Freq: Once | RESPIRATORY_TRACT | Status: AC
  Administered 2021-03-30: 2.5 mg via RESPIRATORY_TRACT

## 2021-04-05 ENCOUNTER — Encounter: Payer: Self-pay | Admitting: *Deleted

## 2021-04-07 ENCOUNTER — Encounter: Payer: Self-pay | Admitting: Internal Medicine

## 2021-04-11 ENCOUNTER — Other Ambulatory Visit: Payer: Self-pay

## 2021-04-13 ENCOUNTER — Encounter: Payer: 59 | Admitting: Gastroenterology

## 2021-04-14 ENCOUNTER — Other Ambulatory Visit (HOSPITAL_COMMUNITY): Payer: Self-pay

## 2021-04-14 ENCOUNTER — Ambulatory Visit (HOSPITAL_COMMUNITY)
Admission: RE | Admit: 2021-04-14 | Discharge: 2021-04-14 | Disposition: A | Discharge status: Home / Self Care | Payer: 59 | Source: Ambulatory Visit | Attending: Family Medicine | Admitting: Family Medicine

## 2021-04-14 ENCOUNTER — Encounter: Payer: Self-pay | Admitting: Internal Medicine

## 2021-04-14 ENCOUNTER — Ambulatory Visit (HOSPITAL_COMMUNITY): Payer: 59 | Admitting: Pharmacist

## 2021-04-14 ENCOUNTER — Other Ambulatory Visit: Payer: Self-pay

## 2021-04-14 ENCOUNTER — Ambulatory Visit (INDEPENDENT_AMBULATORY_CARE_PROVIDER_SITE_OTHER): Payer: 59 | Admitting: Internal Medicine

## 2021-04-14 VITALS — BP 103/75 | HR 73 | Temp 98.2°F | Ht 63.0 in | Wt 103.7 lb

## 2021-04-14 DIAGNOSIS — J439 Emphysema, unspecified: Secondary | ICD-10-CM | POA: Diagnosis not present

## 2021-04-14 DIAGNOSIS — R079 Chest pain, unspecified: Secondary | ICD-10-CM | POA: Diagnosis not present

## 2021-04-14 DIAGNOSIS — F17200 Nicotine dependence, unspecified, uncomplicated: Secondary | ICD-10-CM | POA: Diagnosis not present

## 2021-04-14 MED ORDER — NITROGLYCERIN 0.4 MG SL SUBL
0.4000 mg | SUBLINGUAL_TABLET | Freq: Once | SUBLINGUAL | Status: AC
  Administered 2021-04-14: 0.4 mg via SUBLINGUAL

## 2021-04-14 MED ORDER — SPIRIVA HANDIHALER 18 MCG IN CAPS
18.0000 ug | ORAL_CAPSULE | Freq: Every day | RESPIRATORY_TRACT | 0 refills | Status: DC
  Filled 2021-04-14: qty 30, 30d supply, fill #0
  Filled 2022-03-09: qty 30, 30d supply, fill #1

## 2021-04-14 MED ORDER — ISOSORBIDE MONONITRATE ER 30 MG PO TB24
30.0000 mg | ORAL_TABLET | Freq: Every day | ORAL | 0 refills | Status: DC
  Filled 2021-04-14: qty 60, 60d supply, fill #0

## 2021-04-14 NOTE — Progress Notes (Signed)
CC: discussion of PFTs and chest pain   HPI:  [name] is a 50 y.o. with a PMHx of pre-diabetes, factor V deficiency, and COPD. She is presenting to the [facility] for follow up discussion of her PFTs and for chest pain.  States that her chest pain started about 1 week ago.  Notes that it feels like a constant heavy pressure and that something is sitting on her chest. The pain is worse with exertion and improves with rest, however, the pain still exists at rest. She states that the pain does not radiate to her jaw however she has some numbness and tingling in her arms. The patient also has chronic back pain so it is unclear whether or not this chest pain radiates to her back or if this is her chronic pain. She denies any diaphoresis however she does get the occasional night sweats. Also has chronic nausea and takes Zofran for this, but is not new. Patient denies any lower extremity edema. The only thing that makes her pain better is rest and the only thing that makes her pain worse is exertion. Does not have a history of hypertension, hyperlipidemia, or diabetes. Patient is a former smoker. Of note, she does have a significant family history for heart disease, specifically with her dad having a heart attack in his 63s. Patient was also recently diagnosed with COPD. She has a remote hx of lung cancer s/p R upper and middle lobectomies. Patient is here to initiate treatment.   Past Medical History:  Diagnosis Date   Blood dyscrasia    COPD (chronic obstructive pulmonary disease) ([place])    Current smoker    Difficult intubation    pt was told difficult intubation at [facility]. Hospital   Factor V deficiency [facility])    protein deficiency   Headache    "weekly" (09/04/2017)   Heart murmur    Hypokalemia 10/15/2017   Lung cancer ([place]) dx'd 08/2017   Pleuritic chest pain 06/13/2018   Pre-diabetes    Pulmonary nodule 07/16/2017   Rapid resting heart rate    happens infrequently   Rib pain on right side  12/21/2017   Review of Systems:  Review of Systems  Constitutional:  Negative for chills and fever.  HENT:  Negative for hearing loss and tinnitus.   Eyes:  Negative for blurred vision and double vision.  Respiratory:  Positive for shortness of breath. Negative for cough.   Cardiovascular:  Positive for chest pain. Negative for palpitations, orthopnea, leg swelling and PND.  Gastrointestinal:  Positive for nausea. Negative for abdominal pain, diarrhea and vomiting.  Genitourinary:  Negative for dysuria and hematuria.  Musculoskeletal:  Positive for back pain. Negative for myalgias.  Neurological:  Positive for tingling. Negative for dizziness and headaches.       Tingling in her arms  Psychiatric/Behavioral:  Negative for depression. The patient is not nervous/anxious.     Physical Exam:  Vitals:   04/14/21 1317  Weight: 103 lb 11.2 oz (47 kg)   Physical Exam Constitutional:      General: She is not in acute distress.    Appearance: She is well-developed.  HENT:     Head: Normocephalic and atraumatic.  Cardiovascular:     Rate and Rhythm: Normal rate and regular rhythm.     Pulses:          Carotid pulses are 2+ on the right side and 2+ on the left side.      Radial pulses  are 2+ on the right side and 2+ on the left side.       Dorsalis pedis pulses are 2+ on the right side and 2+ on the left side.       Posterior tibial pulses are 2+ on the right side and 2+ on the left side.     Heart sounds: Normal heart sounds.  Pulmonary:     Effort: Pulmonary effort is normal.     Breath sounds: Normal breath sounds.  Abdominal:     General: Bowel sounds are normal.     Palpations: Abdomen is soft.  Musculoskeletal:        General: Normal range of motion.     Right lower leg: No edema.     Left lower leg: No edema.  Skin:    General: Skin is warm and dry.  Neurological:     General: No focal deficit present.     Mental Status: She is alert and oriented to person, place, and  time.   EKG: No significant ST wave changes. Abnormal p waves noted in lead II, likely consistent with ectopic atrial impulse. Peaked p waves also noted, consistent with left atrial enlargement. T wave inversions in leads V1 and V2 with poor R wave progression. No significant changes as compared to prior EKG.    Assessment & Plan:   See Encounters Tab for problem based charting.  Patient seen with Dr. Jimmye Norman

## 2021-04-14 NOTE — Assessment & Plan Note (Signed)
Patient patient states that she quit smoking but still has the occasional cigarette socially.  She notes that she has 1 to 3 cigarettes in a week.  Encouraged patient to remain abstinent from smoking especially given her new diagnosis of COPD.

## 2021-04-14 NOTE — Patient Instructions (Addendum)
Thank you, Heather [name] for allowing [place] to provide your care today. Today we discussed your chest pain and recent PFTs. Chest pain: EKG in office today showed no sign of an acute heart attack. We also gave you nitroglycerin in the office which made your pain go away. This concerns [place] that something could be going on with your heart, which is why we advised you to be admitted to the hospital for the day. Since you are nable to do this, we are giving you Imdur to take daily for your chest pain, as well as referred you to see a cardiologist. PFTs:   With your new diagnosis of COPD we have prescribed an inhaler for you. Please talk to the pharmacist for details as to how to use this appropriately.  I have ordered the following labs for you:  Lab Orders  No laboratory test(s) ordered today     Tests ordered today:  EKG  Referrals ordered today:   Referral Orders  Ambulatory referral to Cardiology     I have ordered the following medication/changed the following medications:   Stop the following medications: There are no discontinued medications.   Start the following medications: Meds ordered this encounter  Medications   nitroGLYCERIN (NITROSTAT) SL tablet 0.4 mg   nitroGLYCERIN (NITROSTAT) SL tablet 0.4 mg   isosorbide mononitrate (IMDUR) 30 MG 24 hr tablet    Sig: Take 1 tablet (30 mg total) by mouth daily.    Dispense:  60 tablet    Refill:  0   tiotropium (SPIRIVA HANDIHALER) 18 MCG inhalation capsule    Sig: Place 1 capsule (18 mcg total) into inhaler and inhale daily.    Dispense:  90 capsule    Refill:  0     Follow up:  1 month    Remember: To take your inhalers as prescribed!  Should you have any questions or concerns please call the internal medicine clinic at [phone].     Buddy Duty, D.O. [place]

## 2021-04-14 NOTE — Assessment & Plan Note (Addendum)
Patient presenting to the clinic with 1 week of chest pain.  States that it feels like a constant heavy pressure and that something is sitting on her chest.  The pain gets worse with exertion and improves with rest, however, the pain is still present at rest.  Pain does not radiate to her jaw however it does radiate to her arms and feels like it is numb and tingling. She does not have any diaphoresis associated with the chest pain, however she does get diaphoretic at night. This pain also does not bring her any nausea however she does have chronic nausea that she takes Zofran for. The patient has never experienced a pain or pressure like this before. She does not have a history of hypertension, hyperlipidemia, or diabetes.  She denies a former smoker and endorses using about 1 to 3 cigarettes a week currently.  Previously smoking about a pack and half a day for many years.  Of note she does have a significant family history for early heart disease with her dad having a heart attack in his 8s.   EKG performed in office: No significant ST wave changes. Abnormal p waves noted in lead II, likely consistent with ectopic atrial impulse. Peaked p waves also noted, consistent with left atrial enlargement. T wave inversions in leads V1 and V2 with poor R wave progression. No significant changes as compared to prior EKG.   Patient was also given 2 doses of nitroglycerin in the office, given 5 minutes apart.  First dose to improve the patient's chest pressure about 50% and the second dose completely resolved her chest pressure and pain. Expressed concern for unstable angina given the nature of her sxs and that her sxs resolved with nitro. We noted that we would like to admit the patient for observation so she can get a stress test performed as an inpatient. She is a single parent and states that this is not possible for her to do and thus she is declining to be admitted. Discussed other options with the patient and we  will give her Imdur 30mg  to take daily until she is seen by cardiology.  Advised the patient that if her chest pain gets any worse that she must go to the emergency room.  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  We advised the patient to walk down into the clinic if she has any worsening of her pain or has any questions regarding this. She expressed understanding and states that she will do this. Also advised patient that if she gets a headache while taking the Imdur she can use ibuprofen or Tylenol.  If these do not help her headache she is advised to call the clinic.   Plan: - Refer to cardiology, Dr. Einar Gip  - Gave patient rx for Imdur 30mg 

## 2021-04-14 NOTE — Patient Instructions (Addendum)
Ms. Intrieri it was a pleasure seeing you today.   Today we prescribed a new inhaler Spiriva.   These are the instructions for the medications:   1. Open the HANDIHALER device         Separate only one of the blisters from the blister card, then open the blister         If you accidentally open another blister, the capsule in that blister should not be used    2. Insert the SPIRIVA capsule and close the mouthpiece firmly against the gray base until you hear a click    3. Press the green piercing button once until it is flat (flush) against the base, then release           4. Breathe out completely in one breath and then, with the [facility] in your mouth, breathe in deeply until your lungs are full        You should hear or feel the SPIRIVA capsule vibrate (rattle)  If you decide you are interested in nicotine replacement therapy you can call 1-800-QUIT-NOW for free supplies.  Please call the clinic if you have any follow-up questions.

## 2021-04-14 NOTE — Assessment & Plan Note (Signed)
Patient has a remote history of lung cancer status post right upper and middle lobectomies.  She recently had PFTs done consistent with a diagnosis of COPD. The patient is presenting to the [facility] today to initiate treatment and to discuss appropriate inhaler usage with the pharmacist. The patient states that she notices her shortness of breath is worse when exerting herself and that she does not experience shortness of breath while at rest.  She previously was smoking about a pack and a half of cigarettes a day and quit in 2018 when she had her lung surgery.  Notes that she smokes about 1 to 3 cigarettes a week now.   Plan: - Spiriva inhaler BID - Appt with pharmacist to teach pt how to use appropriately

## 2021-04-15 ENCOUNTER — Encounter: Payer: Self-pay | Admitting: Pharmacist

## 2021-04-15 ENCOUNTER — Other Ambulatory Visit (HOSPITAL_COMMUNITY): Payer: Self-pay

## 2021-04-15 NOTE — Progress Notes (Signed)
   Subjective:    Patient ID: Heather [name], female    DOB: [birth year], 50 y.o.   MRN: [id number]  HPI Patient is a 50 y.o. female who presents for or inhaler education and medication review.  She is in good spirits and presents without assistance. Patient was referred on 04/11/21 and last seen by provider, Dr. Raymondo Band, prior to appt today.  Patient recently had PFTs conducted and provider started patient on tiotropium (Spiriva) handihaler.   Insurance coverage/medication affordability: [employer]  Family/Social history: Patient is smoking (1-3 cigarettes per week).   Objective:   Labs:   Physical Exam Neurological:     Mental Status: She is alert and oriented to person, place, and time.   The patient's last PFT was on 03/30/21. FEV1/FVC was 67, post-bronchodilator FEV1 was 1.86, post-bronchodilator FVC was 2.71.   Assessment/Plan:   Patient educated on purpose, proper use and potential adverse effects of Spiriva handihaler. Provider patient education materials and information for 1-800-QUIT NOW to help quit smoking cigarettes altogether. Following instruction patient verbalized understanding of treatment plan.   Follow-up with PCP at next scheduled appt. Written patient instructions provided.  This appointment required 15 minutes of direct patient care.  Thank you for involving pharmacy to assist in providing this patient's care.

## 2021-04-19 ENCOUNTER — Other Ambulatory Visit: Payer: Self-pay

## 2021-04-19 NOTE — Progress Notes (Signed)
Attempted to obtain medical history via telephone, unable to reach at this time. I left a voicemail to return pre surgical testing department's phone call.  

## 2021-04-21 ENCOUNTER — Encounter: Payer: Self-pay | Admitting: Internal Medicine

## 2021-04-22 ENCOUNTER — Encounter: Payer: Self-pay | Admitting: Internal Medicine

## 2021-04-23 NOTE — Progress Notes (Signed)
Internal Medicine Clinic Attending  I saw and evaluated the patient.  I personally confirmed the key portions of the history and exam documented by Dr. Raymondo Band and I reviewed pertinent patient test results.  The assessment, diagnosis, and plan were formulated together and I agree with the documentation in the resident's note.  CP indeed has anginal qualities though is present at rest; it did completely resolve with NTG.  She acknowledges importance of f/u; stress test recommended.

## 2021-04-25 ENCOUNTER — Ambulatory Visit (HOSPITAL_COMMUNITY): Payer: 59 | Admitting: Anesthesiology

## 2021-04-25 ENCOUNTER — Encounter (HOSPITAL_COMMUNITY): Payer: Self-pay | Admitting: Gastroenterology

## 2021-04-25 ENCOUNTER — Ambulatory Visit (HOSPITAL_COMMUNITY)
Admission: RE | Admit: 2021-04-25 | Discharge: 2021-04-25 | Disposition: A | Discharge status: Home / Self Care | Payer: 59 | Attending: Gastroenterology | Admitting: Gastroenterology

## 2021-04-25 ENCOUNTER — Other Ambulatory Visit: Payer: Self-pay

## 2021-04-25 ENCOUNTER — Encounter (HOSPITAL_COMMUNITY)
Admission: RE | Disposition: A | Discharge status: Home / Self Care | Payer: Self-pay | Source: Home / Self Care | Attending: Gastroenterology

## 2021-04-25 DIAGNOSIS — D123 Benign neoplasm of transverse colon: Secondary | ICD-10-CM | POA: Diagnosis not present

## 2021-04-25 DIAGNOSIS — R634 Abnormal weight loss: Secondary | ICD-10-CM | POA: Diagnosis not present

## 2021-04-25 DIAGNOSIS — R1013 Epigastric pain: Secondary | ICD-10-CM | POA: Insufficient documentation

## 2021-04-25 DIAGNOSIS — R1010 Upper abdominal pain, unspecified: Secondary | ICD-10-CM | POA: Diagnosis not present

## 2021-04-25 DIAGNOSIS — K5710 Diverticulosis of small intestine without perforation or abscess without bleeding: Secondary | ICD-10-CM

## 2021-04-25 DIAGNOSIS — K529 Noninfective gastroenteritis and colitis, unspecified: Secondary | ICD-10-CM | POA: Diagnosis not present

## 2021-04-25 DIAGNOSIS — K3189 Other diseases of stomach and duodenum: Secondary | ICD-10-CM | POA: Diagnosis not present

## 2021-04-25 DIAGNOSIS — D682 Hereditary deficiency of other clotting factors: Secondary | ICD-10-CM | POA: Insufficient documentation

## 2021-04-25 DIAGNOSIS — J449 Chronic obstructive pulmonary disease, unspecified: Secondary | ICD-10-CM | POA: Diagnosis not present

## 2021-04-25 DIAGNOSIS — R110 Nausea: Secondary | ICD-10-CM

## 2021-04-25 DIAGNOSIS — K6389 Other specified diseases of intestine: Secondary | ICD-10-CM | POA: Diagnosis not present

## 2021-04-25 DIAGNOSIS — R6881 Early satiety: Secondary | ICD-10-CM | POA: Diagnosis not present

## 2021-04-25 DIAGNOSIS — K635 Polyp of colon: Secondary | ICD-10-CM

## 2021-04-25 DIAGNOSIS — R140 Abdominal distension (gaseous): Secondary | ICD-10-CM

## 2021-04-25 DIAGNOSIS — K5750 Diverticulosis of both small and large intestine without perforation or abscess without bleeding: Secondary | ICD-10-CM | POA: Insufficient documentation

## 2021-04-25 DIAGNOSIS — F17210 Nicotine dependence, cigarettes, uncomplicated: Secondary | ICD-10-CM | POA: Diagnosis not present

## 2021-04-25 DIAGNOSIS — Z681 Body mass index (BMI) 19 or less, adult: Secondary | ICD-10-CM | POA: Insufficient documentation

## 2021-04-25 DIAGNOSIS — K319 Disease of stomach and duodenum, unspecified: Secondary | ICD-10-CM | POA: Diagnosis not present

## 2021-04-25 HISTORY — PX: BIOPSY: SHX5522

## 2021-04-25 HISTORY — PX: ESOPHAGOGASTRODUODENOSCOPY (EGD) WITH PROPOFOL: SHX5813

## 2021-04-25 HISTORY — PX: POLYPECTOMY: SHX5525

## 2021-04-25 HISTORY — PX: COLONOSCOPY WITH PROPOFOL: SHX5780

## 2021-04-25 SURGERY — ESOPHAGOGASTRODUODENOSCOPY (EGD) WITH PROPOFOL
Anesthesia: Monitor Anesthesia Care

## 2021-04-25 MED ORDER — SODIUM CHLORIDE 0.9 % IV SOLN: INTRAVENOUS | Status: DC

## 2021-04-25 MED ORDER — PROPOFOL 500 MG/50ML IV EMUL
INTRAVENOUS | Status: DC | PRN
  Administered 2021-04-25: 150 ug/kg/min via INTRAVENOUS

## 2021-04-25 MED ORDER — LACTATED RINGERS IV SOLN: INTRAVENOUS | Status: DC | PRN

## 2021-04-25 MED ORDER — LIDOCAINE 2% (20 MG/ML) 5 ML SYRINGE
INTRAMUSCULAR | Status: DC | PRN
  Administered 2021-04-25: 50 mg via INTRAVENOUS

## 2021-04-25 MED ORDER — GLYCOPYRROLATE PF 0.2 MG/ML IJ SOSY
PREFILLED_SYRINGE | INTRAMUSCULAR | Status: DC | PRN
  Administered 2021-04-25: .2 mg via INTRAVENOUS

## 2021-04-25 MED ORDER — PROPOFOL 10 MG/ML IV BOLUS
INTRAVENOUS | Status: DC | PRN
  Administered 2021-04-25: 50 mg via INTRAVENOUS

## 2021-04-25 MED ORDER — PROPOFOL 1000 MG/100ML IV EMUL
INTRAVENOUS | Status: AC
  Filled 2021-04-25: qty 100

## 2021-04-25 SURGICAL SUPPLY — 25 items

## 2021-04-25 NOTE — H&P (Signed)
History:  This patient presents for endoscopic testing for Epigastric pain and weight loss (See 5/19/222 office note for clinical details).  CTAP with oral contrast only unrevealing for cause of symptoms. Empiric trial of flagyl andkeflex for ? SIBO did not change symptoms  [name] Referring physician: Aldine Contes, MD  Past Medical History: Past Medical History:  Diagnosis Date   Blood dyscrasia    COPD (chronic obstructive pulmonary disease) ([place])    Current smoker    Difficult intubation    pt was told difficult intubation at [facility]. Hospital   Factor V deficiency [facility])    protein deficiency   Headache    "weekly" (09/04/2017)   Heart murmur    Hypokalemia 10/15/2017   Lung cancer ([place]) dx'd 08/2017   Pleuritic chest pain 06/13/2018   Pre-diabetes    Pulmonary nodule 07/16/2017   Rapid resting heart rate    happens infrequently   Rib pain on right side 12/21/2017     Past Surgical History: Past Surgical History:  Procedure Laterality Date   CESAREAN SECTION  1992; 2012   COLONOSCOPY N/A 05/15/2014   Procedure: COLONOSCOPY;  Surgeon: Lafayette Dragon, MD;  Location: [facility];  Service: Endoscopy;  Laterality: N/A;   ESOPHAGOGASTRODUODENOSCOPY N/A 05/15/2014   Procedure: ESOPHAGOGASTRODUODENOSCOPY (EGD);  Surgeon: Lafayette Dragon, MD;  Location: [facility];  Service: Endoscopy;  Laterality: N/A;   LAPAROSCOPY  07/07/2011   Procedure: LAPAROSCOPY DIAGNOSTIC;  Surgeon: Elveria Royals;  Location: [place] [facility];  Service: Gynecology;  Laterality: N/A;   LESION REMOVAL  07/07/2011   Procedure: EXCISION VAGINAL LESION;  Surgeon: Elveria Royals;  Location: [place] [facility];  Service: Gynecology;  Laterality: N/A;   LOBECTOMY Right 08/20/2017   Procedure: RIGHT UPPER LOBE LOBECTOMY;  Surgeon: [name], MD;  Location: [place];  Service: Thoracic;  Laterality: Right;   LYMPH NODE DISSECTION N/A 08/20/2017   Procedure: LYMPH NODE DISSECTION;  Surgeon: [name], MD;   Location: [place];  Service: Thoracic;  Laterality: N/A;   VAGINAL HYSTERECTOMY  ~ 2014   VIDEO ASSISTED THORACOSCOPY (VATS)/WEDGE RESECTION Right 08/20/2017   Procedure: RIGHT VIDEO ASSISTED THORACOSCOPY (VATS)/WEDGE RESECTION;  Surgeon: [name], MD;  Location: [place];  Service: Thoracic;  Laterality: Right;    Allergies: Allergies  Allergen Reactions   Iohexol Shortness Of Breath, Swelling and Rash    Patient indicates she had swelling all over her body as well as shortness of breath.  Even when she was premedicated, she broke out in a full body rash.   Aspirin Nausea And Vomiting    Outpatient Meds: Current Facility-Administered Medications  Medication Dose Route Frequency Provider Last Rate Last Admin   0.9 %  sodium chloride infusion   Intravenous Continuous Danis, Estill Cotta III, MD          ___________________________________________________________________ Objective   Exam:  BP 129/75   Pulse 71   Temp 98.6 F (37 C) (Oral)   Resp 17   Ht 5' 3.75" (1.619 m)   Wt 44.5 kg   SpO2 100%   BMI 16.95 kg/m  Thin as before CV: RRR without murmur, S1/S2, no JVD, no peripheral edema Resp: clear to auscultation bilaterally, normal RR and effort noted GI: soft, no bruit, no tenderness, with active bowel sounds. No guarding or palpable organomegaly noted. Neuro: awake, alert and oriented x 3. Normal gross motor function and fluent speech   Assessment:  Epigastric pain Early satiety Altered bowel habits Weight loss  Plan:  EGD and colonoscopy  The benefits and risks of the planned procedure were described in detail with the patient or (when appropriate) their health care proxy.  Risks were outlined as including, but not limited to, bleeding, infection, perforation, adverse medication reaction leading to cardiac or pulmonary decompensation, pancreatitis (if ERCP).  The limitation of incomplete mucosal visualization was also discussed.  No guarantees or  warranties were given.    [name]

## 2021-04-25 NOTE — Anesthesia Postprocedure Evaluation (Signed)
Anesthesia Post Note  Patient: Heather [name]  Procedure(s) Performed: ESOPHAGOGASTRODUODENOSCOPY (EGD) WITH PROPOFOL COLONOSCOPY WITH PROPOFOL BIOPSY POLYPECTOMY     Patient location during evaluation: PACU Anesthesia Type: MAC Level of consciousness: awake and alert Pain management: pain level controlled Vital Signs Assessment: post-procedure vital signs reviewed and stable Respiratory status: spontaneous breathing, nonlabored ventilation and respiratory function stable Cardiovascular status: stable and blood pressure returned to baseline Anesthetic complications: no   No notable events documented.  Last Vitals:  Vitals:   04/25/21 1000 04/25/21 1010  BP: 119/71 121/69  Pulse: 80 74  Resp: 18 13  Temp:    SpO2: 100% 100%    Last Pain:  Vitals:   04/25/21 1010  TempSrc:   PainSc: 0-No pain                 Audry Pili

## 2021-04-25 NOTE — Op Note (Signed)
[facility] Patient Name: Heather [name] Procedure Date: 04/25/2021 MRN: [id number] Attending MD: Estill Cotta. Loletha Carrow , MD Date of Birth: [birth year] CSN: [id number] Age: 50 Admit Type: Outpatient Procedure:                Upper GI endoscopy Indications:              Epigastric abdominal pain, Previously treated for                            Helicobacter pylori, Weight loss, intermittent                            diarrhea Providers:                Mallie Mussel L. Loletha Carrow, MD, Kary Kos RN, RN, Cherylynn Ridges, Technician, Margurite Auerbach CRNA Referring MD:              Medicines:                Monitored Anesthesia Care Complications:            No immediate complications. Estimated Blood Loss:     Estimated blood loss was minimal. Procedure:                Pre-Anesthesia Assessment:                           - Prior to the procedure, a History and Physical                            was performed, and patient medications and                            allergies were reviewed. The patient's tolerance of                            previous anesthesia was also reviewed. The risks                            and benefits of the procedure and the sedation                            options and risks were discussed with the patient.                            All questions were answered, and informed consent                            was obtained. Prior Anticoagulants: The patient has                            taken no previous anticoagulant or antiplatelet  agents. ASA Grade Assessment: II - A patient with                            mild systemic disease. After reviewing the risks                            and benefits, the patient was deemed in                            satisfactory condition to undergo the procedure.                           After obtaining informed consent, the endoscope was                            passed under  direct vision. Throughout the                            procedure, the patient's blood pressure, pulse, and                            oxygen saturations were monitored continuously. The                            GIF-H190 ([id number]) Olympus gastroscope was                            introduced through the mouth, and advanced to the                            second part of duodenum. The upper GI endoscopy was                            accomplished without difficulty. The patient                            tolerated the procedure well. Scope In: Scope Out: Findings:      The esophagus was normal.      The entire examined stomach was normal. Several biopsies were obtained       on the greater curvature of the gastric body, on the lesser curvature of       the gastric body, on the greater curvature of the gastric antrum and on       the lesser curvature of the gastric antrum with cold forceps for       histology.      The cardia and gastric fundus were normal on retroflexion.      Two diverticulae were found in the second portion of the duodenum.      Normal mucosa was found in the entire duodenum. Six biopsies for       histology were taken with a cold forceps for evaluation of celiac       disease. Impression:               - Normal esophagus.                           -  Normal stomach.                           - Duodenal diverticulum.                           - Normal mucosa was found in the entire examined                            duodenum. Biopsied.                           - Several biopsies were obtained on the greater                            curvature of the gastric body, on the lesser                            curvature of the gastric body, on the greater                            curvature of the gastric antrum and on the lesser                            curvature of the gastric antrum. Moderate Sedation:      MAC sedation used Recommendation:           - Patient  has a contact number available for                            emergencies. The signs and symptoms of potential                            delayed complications were discussed with the                            patient. Return to normal activities tomorrow.                            Written discharge instructions were provided to the                            patient.                           - Resume previous diet.                           - Continue present medications.                           - Await pathology results.                           - See the other procedure note for documentation of  additional recommendations. Procedure Code(s):        --- Professional ---                           [phone], Esophagogastroduodenoscopy, flexible,                            transoral; with biopsy, single or multiple Diagnosis Code(s):        --- Professional ---                           R10.13, Epigastric pain                           R63.4, Abnormal weight loss                           K57.10, Diverticulosis of small intestine without                            perforation or abscess without bleeding CPT copyright 2019 [employer]. All rights reserved. The codes documented in this report are preliminary and upon coder review may  be revised to meet current compliance requirements. Serene Kopf L. Loletha Carrow, MD 04/25/2021 9:52:28 AM This report has been signed electronically. Number of Addenda: 0

## 2021-04-25 NOTE — Anesthesia Preprocedure Evaluation (Addendum)
Anesthesia Evaluation  Patient identified by MRN, date of birth, ID band Patient awake    Reviewed: Allergy & Precautions, NPO status , Patient's Chart, lab work & pertinent test results  History of Anesthesia Complications (+) history of anesthetic complications (Noted hx of difficult intubation at outside hospital. Was grade 1 view in 2018 (after hx of difficult intubation), grade 1 view with Mac 3 documented)  Airway Mallampati: III  TM Distance: >3 FB Neck ROM: Full    Dental  (+) Dental Advisory Given, Partial Lower, Missing, Chipped   Pulmonary COPD,  COPD inhaler, Current Smoker and Patient abstained from smoking.,   Hx Lung cancer    Pulmonary exam normal        Cardiovascular negative cardio ROS Normal cardiovascular exam     Neuro/Psych  Headaches, PSYCHIATRIC DISORDERS Depression    GI/Hepatic negative GI ROS, Neg liver ROS,   Endo/Other   Pre-DM   Renal/GU negative Renal ROS     Musculoskeletal negative musculoskeletal ROS (+)   Abdominal   Peds  Hematology  (+) Blood dyscrasia, ,  Factor V Leiden mutation - no hx of clots, discovered after testing due to mother's diagnosis, no current anticoagulant use      Anesthesia Other Findings   Reproductive/Obstetrics                            Anesthesia Physical Anesthesia Plan  ASA: 3  Anesthesia Plan: MAC   Post-op Pain Management:    Induction:   PONV Risk Score and Plan: 2 and Propofol infusion and Treatment may vary due to age or medical condition  Airway Management Planned: Nasal Cannula and Natural Airway  Additional Equipment: None  Intra-op Plan:   Post-operative Plan:   Informed Consent: I have reviewed the patients History and Physical, chart, labs and discussed the procedure including the risks, benefits and alternatives for the proposed anesthesia with the patient or authorized representative who has  indicated his/her understanding and acceptance.       Plan Discussed with: CRNA and Anesthesiologist  Anesthesia Plan Comments:        Anesthesia Quick Evaluation

## 2021-04-25 NOTE — Interval H&P Note (Signed)
History and Physical Interval Note:  04/25/2021 8:58 AM  Heather [name]  has presented today for surgery, with the diagnosis of upper abd pain, nausea, early satiety, weight loss.  The various methods of treatment have been discussed with the patient and family. After consideration of risks, benefits and other options for treatment, the patient has consented to  Procedure(s): ESOPHAGOGASTRODUODENOSCOPY (EGD) WITH PROPOFOL (N/A) COLONOSCOPY WITH PROPOFOL (N/A) as a surgical intervention.  The patient's history has been reviewed, patient examined, no change in status, stable for surgery.  I have reviewed the patient's chart and labs.  Questions were answered to the patient's satisfaction.     [name]

## 2021-04-25 NOTE — Op Note (Signed)
[facility] Patient Name: Heather [name] Procedure Date: 04/25/2021 MRN: [id number] Attending MD: Estill Cotta. Loletha Carrow , MD Date of Birth: 08-26-1971 CSN: [id number] Age: 50 Admit Type: Outpatient Procedure:                Colonoscopy Indications:              Epigastric abdominal pain, Chronic diarrhea                            (alternating with small, thin formed stools),                            Weight loss                           see 02/17/21 clinic note for details. symptoms did                            not improve with an empiric course of metronidazole                            and cephalexin. CTAP with oral contrast only (IV                            dye allergy) unrevealing for cause of symptoms Providers:                Mallie Mussel L. Loletha Carrow, MD, Kary Kos RN, RN, Cherylynn Ridges, Darlina Rumpf CRNA Referring MD:              Medicines:                Monitored Anesthesia Care Complications:            No immediate complications. Estimated Blood Loss:     Estimated blood loss was minimal. Procedure:                Pre-Anesthesia Assessment:                           - Prior to the procedure, a History and Physical                            was performed, and patient medications and                            allergies were reviewed. The patient's tolerance of                            previous anesthesia was also reviewed. The risks                            and benefits of the procedure and the sedation  options and risks were discussed with the patient.                            All questions were answered, and informed consent                            was obtained. Prior Anticoagulants: The patient has                            taken no previous anticoagulant or antiplatelet                            agents. ASA Grade Assessment: II - A patient with                            mild systemic  disease. After reviewing the risks                            and benefits, the patient was deemed in                            satisfactory condition to undergo the procedure.                           After obtaining informed consent, the colonoscope                            was passed under direct vision. Throughout the                            procedure, the patient's blood pressure, pulse, and                            oxygen saturations were monitored continuously. The                            PCF-H190DL ([id number] Olympus pediatric colonscope                            was introduced through the anus and advanced to the                            the terminal ileum, with identification of the                            appendiceal orifice and IC valve. The colonoscopy                            was somewhat difficult due to a tortuous colon.                            Successful completion of the procedure was aided by  using manual pressure and water inflation. The                            patient tolerated the procedure well. The quality                            of the bowel preparation was good. The terminal                            ileum, ileocecal valve, appendiceal orifice, and                            rectum were photographed. Scope In: 9:20:36 AM Scope Out: 9:45:47 AM Scope Withdrawal Time: 0 hours 19 minutes 27 seconds  Total Procedure Duration: 0 hours 25 minutes 11 seconds  Findings:      The perianal and digital rectal examinations were normal.      The terminal ileum appeared normal.      Three sessile polyps were found in the transverse colon. The polyps were       3 to 6 mm in size. These polyps were removed with a cold snare.       Resection and retrieval were complete.      Normal mucosa was found in the entire colon. Biopsies for histology were       taken with a cold forceps from the right colon and left colon for        evaluation of microscopic colitis.      Multiple diverticula were found in the left colon, with associated       tortuosity and haustral thickening.      The exam was otherwise without abnormality on direct and retroflexion       views. Impression:               - The examined portion of the ileum was normal.                           - Three 3 to 6 mm polyps in the transverse colon,                            removed with a cold snare. Resected and retrieved.                           - Normal mucosa in the entire examined colon.                            Biopsied.                           - Diverticulosis in the left colon.                           - The examination was otherwise normal on direct                            and retroflexion views. Moderate Sedation:      MAC sedation used Recommendation:           -  Patient has a contact number available for                            emergencies. The signs and symptoms of potential                            delayed complications were discussed with the                            patient. Return to normal activities tomorrow.                            Written discharge instructions were provided to the                            patient.                           - Resume previous diet.                           - Continue present medications.                           - Await pathology results.                           - Repeat colonoscopy date to be determined after                            pending pathology results are reviewed for                            surveillance.                           - Return to my office at appointment to be                            scheduled.                           - See the other procedure note for documentation of                            additional recommendations. Procedure Code(s):        --- Professional ---                           [id number], Colonoscopy, flexible; with removal  of                            tumor(s), polyp(s), or other lesion(s) by snare                            technique  55374, 59, Colonoscopy, flexible; with biopsy,                            single or multiple Diagnosis Code(s):        --- Professional ---                           K63.5, Polyp of colon                           R10.13, Epigastric pain                           K52.9, Noninfective gastroenteritis and colitis,                            unspecified                           R63.4, Abnormal weight loss                           K57.30, Diverticulosis of large intestine without                            perforation or abscess without bleeding CPT copyright 2019 American Medical Association. All rights reserved. The codes documented in this report are preliminary and upon coder review may  be revised to meet current compliance requirements. Hyatt Capobianco L. Loletha Carrow, MD 04/25/2021 10:01:25 AM This report has been signed electronically. Number of Addenda: 0

## 2021-04-25 NOTE — Transfer of Care (Signed)
Immediate Anesthesia Transfer of Care Note  Patient: Heather [name]  Procedure(s) Performed: ESOPHAGOGASTRODUODENOSCOPY (EGD) WITH PROPOFOL COLONOSCOPY WITH PROPOFOL BIOPSY POLYPECTOMY  Patient Location: PACU and Endoscopy Unit  Anesthesia Type:MAC  Level of Consciousness: awake, alert  and oriented  Airway & Oxygen Therapy: Patient Spontanous Breathing and Patient connected to face mask  Post-op Assessment: Report given to RN and Post -op Vital signs reviewed and stable  Post vital signs: Reviewed and stable  Last Vitals:  Vitals Value Taken Time  BP 104/68 04/25/21 0950  Temp 36.9 C 04/25/21 0950  Pulse 80 04/25/21 1000  Resp 18 04/25/21 1000  SpO2 100 % 04/25/21 1000    Last Pain:  Vitals:   04/25/21 0950  TempSrc: Oral  PainSc: 0-No pain         Complications: No notable events documented.

## 2021-04-25 NOTE — Discharge Instructions (Signed)
YOU HAD AN ENDOSCOPIC PROCEDURE TODAY: Refer to the procedure report and other information in the discharge instructions given to you for any specific questions about what was found during the examination. If this information does not answer your questions, please call [facility] office at [phone] to clarify.  ° °YOU SHOULD EXPECT: Some feelings of bloating in the abdomen. Passage of more gas than usual. Walking can help get rid of the air that was put into your GI tract during the procedure and reduce the bloating. If you had a lower endoscopy (such as a colonoscopy or flexible sigmoidoscopy) you may notice spotting of blood in your stool or on the toilet paper. Some abdominal soreness may be present for a day or two, also. ° °DIET: Your first meal following the procedure should be a light meal and then it is ok to progress to your normal diet. A half-sandwich or bowl of soup is an example of a good first meal. Heavy or fried foods are harder to digest and may make you feel nauseous or bloated. Drink plenty of fluids but you should avoid alcoholic beverages for 24 hours.  ° °ACTIVITY: Your care partner should take you home directly after the procedure. You should plan to take it easy, moving slowly for the rest of the day. You can resume normal activity the day after the procedure however YOU SHOULD NOT DRIVE, use power tools, machinery or perform tasks that involve climbing or major physical exertion for 24 hours (because of the sedation medicines used during the test).  ° °SYMPTOMS TO REPORT IMMEDIATELY: °A gastroenterologist can be reached at any hour. Please call [phone]  for any of the following symptoms:  °Following lower endoscopy (colonoscopy, flexible sigmoidoscopy) °Excessive amounts of blood in the stool  °Significant tenderness, worsening of abdominal pains  °Swelling of the abdomen that is new, acute  °Fever of 100° or higher  °Following upper endoscopy (EGD, EUS, ERCP, esophageal  dilation) °Vomiting of blood or coffee ground material  °New, significant abdominal pain  °New, significant chest pain or pain under the shoulder blades  °Painful or persistently difficult swallowing  °New shortness of breath  °Black, tarry-looking or red, bloody stools ° °FOLLOW UP:  °If any biopsies were taken you will be contacted by phone or by letter within the next 1-3 weeks. Call [phone]  if you have not heard about the biopsies in 3 weeks.  °Please also call with any specific questions about appointments or follow up tests.  °

## 2021-04-26 ENCOUNTER — Encounter: Payer: Self-pay | Admitting: Gastroenterology

## 2021-04-26 ENCOUNTER — Encounter (HOSPITAL_COMMUNITY): Payer: Self-pay | Admitting: Gastroenterology

## 2021-04-26 DIAGNOSIS — Z902 Acquired absence of lung [part of]: Secondary | ICD-10-CM | POA: Diagnosis not present

## 2021-04-26 DIAGNOSIS — C3490 Malignant neoplasm of unspecified part of unspecified bronchus or lung: Secondary | ICD-10-CM | POA: Diagnosis not present

## 2021-04-26 LAB — SURGICAL PATHOLOGY

## 2021-04-27 ENCOUNTER — Other Ambulatory Visit: Payer: Self-pay

## 2021-04-27 DIAGNOSIS — R140 Abdominal distension (gaseous): Secondary | ICD-10-CM

## 2021-04-27 DIAGNOSIS — R1010 Upper abdominal pain, unspecified: Secondary | ICD-10-CM

## 2021-04-27 DIAGNOSIS — R110 Nausea: Secondary | ICD-10-CM

## 2021-04-27 DIAGNOSIS — R6881 Early satiety: Secondary | ICD-10-CM

## 2021-04-27 NOTE — Progress Notes (Signed)
Letter mailed to pt, recall entered in epic.

## 2021-05-05 ENCOUNTER — Ambulatory Visit: Payer: Self-pay | Admitting: Cardiology

## 2021-05-11 NOTE — Telephone Encounter (Signed)
Patient returned phone call. Patient has appointment with cardiologist tomorrow. Provided patient my recommendations to discuss with cardiologist. Will have cardiology make decision regarding Imdur 30mg  once daily as patient has already completed her doses leading up to appointment.

## 2021-05-11 NOTE — Progress Notes (Signed)
Patient referred by Aldine Contes, MD for chest pain  Subjective:   Heather [name], female    DOB: 1971/03/01, 50 y.o.   MRN: [id number]   Chief Complaint  Patient presents with   Precordial pain   New Patient (Initial Visit)     HPI  50 y.o. 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.  She has strong family history of coronary artery disease with father having had multiple MIs starting age 85s, as well as history of abdominal aorta aneurysm in father and 2 maternal aunts.  Patient has had retrosternal chest heaviness, both at rest and exertion, associated with shortness of breath and cough.  Symptoms are worse with exertion.  Patient underwent right lung upper lobectomy in 2018 for stage Ia adenocarcinoma.  Since then she is experience chronic cough and weight loss.  However, current symptoms are new. Patient recently underwent EGD and colonoscopy by Dr. Mallie Mussel.  EGD was normal.  Colonoscopy showed polyps in the transverse colon, diverticulosis of left colon, otherwise normal.   Patient was recently started on Imdur for management of chest pain.  She has had severe headache with the same.  On a separate note, patient also reports bilateral calf claudication on walking.  She is down to less than half a pack of cigarettes a week.  Prior to her lung cancer surgery, she smoked 1-1/2-2 packs daily.   Past Medical History:  Diagnosis Date   Blood dyscrasia    COPD (chronic obstructive pulmonary disease) ([place])    Current smoker    Difficult intubation    pt was told difficult intubation at 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   Headache    "weekly" (09/04/2017)   Heart murmur    Hypokalemia 10/15/2017   Lung cancer ([place]) dx'd 08/2017   Pleuritic chest pain 06/13/2018   Pre-diabetes     Pulmonary nodule 07/16/2017   Rapid resting heart rate    happens infrequently   Rib pain on right side 12/21/2017     Past Surgical History:  Procedure Laterality Date   BIOPSY  04/25/2021   Procedure: BIOPSY;  Surgeon: Doran Stabler, MD;  Location: [facility];  Service: Gastroenterology;;  [place] and [place]; 2012   COLONOSCOPY N/A 05/15/2014   Procedure: COLONOSCOPY;  Surgeon: Lafayette Dragon, MD;  Location: [facility];  Service: Endoscopy;  Laterality: N/A;   COLONOSCOPY WITH PROPOFOL N/A 04/25/2021   Procedure: COLONOSCOPY WITH PROPOFOL;  Surgeon: Doran Stabler, MD;  Location: [facility];  Service: Gastroenterology;  Laterality: N/A;   ESOPHAGOGASTRODUODENOSCOPY N/A 05/15/2014   Procedure: ESOPHAGOGASTRODUODENOSCOPY (EGD);  Surgeon: Lafayette Dragon, MD;  Location: [facility];  Service: Endoscopy;  Laterality: N/A;   ESOPHAGOGASTRODUODENOSCOPY (EGD) WITH PROPOFOL N/A 04/25/2021   Procedure: ESOPHAGOGASTRODUODENOSCOPY (EGD) WITH PROPOFOL;  Surgeon: Doran Stabler, MD;  Location: [facility];  Service: Gastroenterology;  Laterality: N/A;   LAPAROSCOPY  07/07/2011   Procedure: LAPAROSCOPY DIAGNOSTIC;  Surgeon: Elveria Royals;  Location: [facility];  Service: Gynecology;  Laterality: N/A;   LESION REMOVAL  07/07/2011   Procedure: EXCISION VAGINAL LESION;  Surgeon: Elveria Royals;  Location: [facility];  Service: Gynecology;  Laterality: N/A;   LOBECTOMY Right 08/20/2017   Procedure: RIGHT UPPER LOBE LOBECTOMY;  Surgeon: Roxan Hockey,  Revonda Standard, MD;  Location: [facility];  Service: Thoracic;  Laterality: Right;   LYMPH NODE DISSECTION N/A 08/20/2017   Procedure: LYMPH NODE DISSECTION;  Surgeon: [name], MD;  Location: [facility];  Service: Thoracic;  Laterality: N/A;   POLYPECTOMY  04/25/2021   Procedure: POLYPECTOMY;  Surgeon: Doran Stabler, MD;  Location: [facility];  Service: Gastroenterology;;   VAGINAL HYSTERECTOMY  ~ 2014   VIDEO ASSISTED  THORACOSCOPY (VATS)/WEDGE RESECTION Right 08/20/2017   Procedure: RIGHT VIDEO ASSISTED THORACOSCOPY (VATS)/WEDGE RESECTION;  Surgeon: [name], MD;  Location: [facility];  Service: Thoracic;  Laterality: Right;     Social History   Tobacco Use  Smoking Status Some Days   Packs/day: 0.75   Years: 34.00   Pack years: 25.50   Types: Cigarettes   Last attempt to quit: 08/17/2017   Years since quitting: 3.7  Smokeless Tobacco Never  Tobacco Comments   1-3 cigarette per week currently    Social History   Substance and Sexual Activity  Alcohol Use Not Currently   Alcohol/week: 0.0 standard drinks   Comment: 09/04/2017 "a mixed drink a few times/year"     Family History  Problem Relation Age of Onset   Breast cancer Sister 91   Colon polyps Father    Colon cancer Paternal Uncle    Colon polyps Paternal Uncle    Stomach cancer Maternal Grandmother      Current Outpatient Medications on File Prior to Visit  Medication Sig Dispense Refill   albuterol (PROVENTIL HFA) 108 (90 Base) MCG/ACT inhaler Inhale 2 puffs into the lungs every 6 (six) hours as needed for wheezing or shortness of breath. 8.5 g 2   isosorbide mononitrate (IMDUR) 30 MG 24 hr tablet Take 1 tablet (30 mg total) by mouth daily. 60 tablet 0   mirtazapine (REMERON) 15 MG tablet Take 1 tablet (15 mg total) by mouth at bedtime. 30 tablet 2   ondansetron (ZOFRAN) 4 MG tablet Take 1 tablet (4 mg total) by mouth every 8 (eight) hours as needed for nausea or vomiting. 30 tablet 1   pantoprazole (PROTONIX) 40 MG tablet TAKE 1 TABLET (40 MG TOTAL) BY MOUTH DAILY. (Patient taking differently: Take 40 mg by mouth daily as needed (acid reflux).) 30 tablet 1   tiotropium (SPIRIVA HANDIHALER) 18 MCG inhalation capsule Place 1 capsule (18 mcg total) into inhaler and inhale daily. 90 capsule 0   No current facility-administered medications on file prior to visit.    Cardiovascular and other pertinent studies:  EKG  05/12/2021: Sinus rhythm 81 bpm  RSR(V1) -nondiagnostic Borderline right atrial enlargement   Recent labs: 02/15/2021: Glucose 86, BUN/Cr 11/70. EGFR 105. Na/K 140/4.6. Rest of the CMP normal H/H 13/39. MCV 93. Platelets 125 HbA1C N/A Chol 173, TG 52, HDL 71, LDL 92 TSH 1.6 normal    Review of Systems  Cardiovascular:  Positive for chest pain and dyspnea on exertion. Negative for leg swelling, palpitations and syncope.        Vitals:   05/12/21 0939  BP: 108/67  Pulse: 86  Resp: 16  Temp: 97.8 F (36.6 C)  SpO2: 95%     Body mass index is 18.6 kg/m. Filed Weights   05/12/21 0939  Weight: 105 lb (47.6 kg)     Objective:   Physical Exam Vitals and nursing note reviewed.  Constitutional:      General: She is not in acute distress.    Appearance: She is well-developed.  HENT:  Head: Normocephalic and atraumatic.  Eyes:     Conjunctiva/sclera: Conjunctivae normal.     Pupils: Pupils are equal, round, and reactive to light.  Neck:     Vascular: No JVD.  Cardiovascular:     Rate and Rhythm: Normal rate and regular rhythm.     Pulses: Intact distal pulses.          Dorsalis pedis pulses are 1+ on the right side and 1+ on the left side.       Posterior tibial pulses are 1+ on the right side and 0 on the left side.     Heart sounds: Normal heart sounds. No murmur heard. Pulmonary:     Effort: Pulmonary effort is normal.     Breath sounds: Normal breath sounds. No wheezing or rales.  Abdominal:     General: Bowel sounds are normal.     Palpations: Abdomen is soft.     Tenderness: There is no rebound.  Musculoskeletal:        General: No tenderness. Normal range of motion.     Left lower leg: No edema.  Lymphadenopathy:     Cervical: No cervical adenopathy.  Skin:    General: Skin is warm and dry.  Neurological:     Mental Status: She is alert and oriented to person, place, and time.     Cranial Nerves: No cranial nerve deficit.          Assessment & Recommendations:   50 y.o. African American female with h/o stage Ia adenocarcinoma of lung right upper lobectomy (08/2017), former heavy smoker, anemia, chronic weight loss, strong family h/o CAD and AAA, referred for chest pain  Chest pain: Concerning for angina. Has risk factors for CAD, including h/o tobacco dependence,  strong family h/o CAD. She has contrast induced nephropathy. Therefore, I will avoid CTA. I will obtain exercise nuclear stress test and calcium score scan.  Given her side effects of headache form imdur, switched it to metoprolol tartrate 25 mg bid. Hold two doses before stress test . Prescribed as needed SL NTG Recommend Aspirin 81 mg daily for now  Claudication: Suspect PAD. Will check LEA duplex Also, given strong family h/o AAA, will check abdominal aorta duplex  Thank you for referring the patient to [place]. Please feel free to contact with any questions.   Nigel Mormon, MD Pager: [phone] Office: [phone]

## 2021-05-12 ENCOUNTER — Ambulatory Visit: Payer: 59 | Admitting: Cardiology

## 2021-05-12 ENCOUNTER — Other Ambulatory Visit: Payer: Self-pay

## 2021-05-12 ENCOUNTER — Other Ambulatory Visit (HOSPITAL_COMMUNITY): Payer: Self-pay

## 2021-05-12 ENCOUNTER — Encounter: Payer: Self-pay | Admitting: Cardiology

## 2021-05-12 VITALS — BP 108/67 | HR 86 | Temp 97.8°F | Resp 16 | Ht 63.0 in | Wt 105.0 lb

## 2021-05-12 DIAGNOSIS — Z8249 Family history of ischemic heart disease and other diseases of the circulatory system: Secondary | ICD-10-CM | POA: Diagnosis not present

## 2021-05-12 DIAGNOSIS — R072 Precordial pain: Secondary | ICD-10-CM

## 2021-05-12 DIAGNOSIS — I739 Peripheral vascular disease, unspecified: Secondary | ICD-10-CM | POA: Diagnosis not present

## 2021-05-12 MED ORDER — NITROGLYCERIN 0.4 MG SL SUBL
0.4000 mg | SUBLINGUAL_TABLET | SUBLINGUAL | 1 refills | Status: DC | PRN
  Filled 2021-05-12: qty 25, 10d supply, fill #0
  Filled 2022-05-03: qty 25, 10d supply, fill #1

## 2021-05-12 MED ORDER — ASPIRIN EC 81 MG PO TBEC: 81.0000 mg | DELAYED_RELEASE_TABLET | Freq: Every day | ORAL | 1 refills | Status: DC

## 2021-05-12 MED ORDER — METOPROLOL SUCCINATE ER 25 MG PO TB24
50.0000 mg | ORAL_TABLET | Freq: Every day | ORAL | 1 refills | Status: DC
  Filled 2021-05-12: qty 30, 15d supply, fill #0

## 2021-05-20 ENCOUNTER — Ambulatory Visit: Payer: 59

## 2021-05-20 ENCOUNTER — Other Ambulatory Visit: Payer: Self-pay

## 2021-05-20 DIAGNOSIS — R072 Precordial pain: Secondary | ICD-10-CM

## 2021-05-24 ENCOUNTER — Telehealth: Payer: Self-pay | Admitting: Medical Oncology

## 2021-05-24 NOTE — Telephone Encounter (Signed)
Request to D/C oxygen- Pt called and requested to D/C  her oxygen . She said that she does not need it anymore and her O2 sats are good.  I was unable to reach her by phone . On 03/26/20  Sandi Mealy ordered oxygen for her.

## 2021-05-26 ENCOUNTER — Ambulatory Visit: Payer: 59

## 2021-05-26 ENCOUNTER — Other Ambulatory Visit: Payer: Self-pay

## 2021-05-26 DIAGNOSIS — Z8249 Family history of ischemic heart disease and other diseases of the circulatory system: Secondary | ICD-10-CM

## 2021-05-26 DIAGNOSIS — I77811 Abdominal aortic ectasia: Secondary | ICD-10-CM

## 2021-05-26 DIAGNOSIS — I739 Peripheral vascular disease, unspecified: Secondary | ICD-10-CM

## 2021-06-13 ENCOUNTER — Other Ambulatory Visit: Payer: 59

## 2021-06-20 ENCOUNTER — Ambulatory Visit: Payer: 59 | Admitting: Cardiology

## 2021-06-23 ENCOUNTER — Inpatient Hospital Stay: Admission: RE | Admit: 2021-06-23 | Payer: 59 | Source: Ambulatory Visit

## 2021-07-06 ENCOUNTER — Other Ambulatory Visit: Payer: 59

## 2021-07-12 NOTE — Progress Notes (Signed)
Patient referred by Aldine Contes, MD for chest pain  Subjective:   Heather [name], female    DOB: 11/02/1970, 50 y.o.   MRN: [id number]   Chief Complaint  Patient presents with   Precordial pain   Follow-up   Results   Dizziness     HPI  50 y.o. African American female with h/o stage Ia adenocarcinoma of lung right upper lobectomy (08/2017), anemia, chronic weight loss, referred for chest pain  Patient is here for follow-up visit today.  He does not believe that she underwent exercise nuclear stress test as recommended.  Echocardiogram showed structurally normal heart, mild tricuspid regurgitation.  Abdominal aorta duplex showed mild plaque in the mid to distal aorta with mild ectasia in proximal and distal aorta measuring approximately 2.4 cm.  Lower extremity arterial duplex showed multiphasic waveforms at both ankles, suggestive of no significant PAD.  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.  She has strong family history of coronary artery disease with father having had multiple MIs starting age 52s, as well as history of abdominal aorta aneurysm in father and 2 maternal aunts.  Patient has had retrosternal chest heaviness, both at rest and exertion, associated with shortness of breath and cough.  Symptoms are worse with exertion.  Patient underwent right lung upper lobectomy in 2018 for stage Ia adenocarcinoma.  Since then she is experience chronic cough and weight loss.  However, current symptoms are new. Patient recently underwent EGD and colonoscopy by Dr. Mallie Mussel.  EGD was normal.  Colonoscopy showed polyps in the transverse colon, diverticulosis of left colon, otherwise normal.   Patient was recently started on Imdur for management of chest pain.  She has had severe headache with the same.  On a separate note, patient also reports bilateral calf claudication on walking.  She is down to less than half a pack of cigarettes a  week.  Prior to her lung cancer surgery, she smoked 1-1/2-2 packs daily.   Current Outpatient Medications on File Prior to Visit  Medication Sig Dispense Refill   albuterol (PROVENTIL HFA) 108 (90 Base) MCG/ACT inhaler Inhale 2 puffs into the lungs every 6 (six) hours as needed for wheezing or shortness of breath. 8.5 g 2   aspirin EC 81 MG tablet Take 1 tablet (81 mg total) by mouth daily. Swallow whole. 30 tablet 1   metoprolol succinate (TOPROL-XL) 25 MG 24 hr tablet Take 2 tablets (50 mg total) by mouth daily. Take with or immediately following a meal. 30 tablet 1   mirtazapine (REMERON) 15 MG tablet Take 1 tablet (15 mg total) by mouth at bedtime. 30 tablet 2   nitroGLYCERIN (NITROSTAT) 0.4 MG SL tablet Place 1 tablet (0.4 mg total) under the tongue every 5 (five) minutes as needed for chest pain. 25 tablet 1   ondansetron (ZOFRAN) 4 MG tablet Take 1 tablet (4 mg total) by mouth every 8 (eight) hours as needed for nausea or vomiting. 30 tablet 1   pantoprazole (PROTONIX) 40 MG tablet TAKE 1 TABLET (40 MG TOTAL) BY MOUTH DAILY. (Patient taking differently: Take 40 mg by mouth daily as needed (acid reflux).) 30 tablet 1   tiotropium (SPIRIVA HANDIHALER) 18 MCG inhalation capsule Place 1 capsule (18 mcg total) into inhaler and inhale daily. 90 capsule 0   No current facility-administered medications on file prior to visit.    Cardiovascular and other pertinent studies:  Abdominal Aortic Duplex 05/26/2021:  Mild plaque noted in  the mid and distal aorta. There is ectasia noted in  the proximal and distal aorta measuring approximately 2.4 cm. Recommend re  evaluation in 5 years for stability.  Normal iliac artery velocity.   Lower Extremity Arterial Duplex 05/26/2021:  No hemodynamically significant stenoses are identified in the right lower  extremity arterial system. No hemodynamically significant stenoses are  identified in the left lower extremity arterial system.  This exam reveals  normal perfusion of the right and left  lower extremity  (ABI 1.00) with multiphasic waveforms at both ankles. Evaluate for pseudo  claudication.  Echocardiogram 05/20/2021:  Left ventricle cavity is normal in size and wall thickness. Normal global  wall motion. Normal LV systolic function with EF 67%. Normal diastolic  filling pattern.  Mild tricuspid regurgitation.  No evidence of pulmonary hypertension.  EKG 05/12/2021: Sinus rhythm 81 bpm  RSR(V1) -nondiagnostic Borderline right atrial enlargement   Recent labs: 02/15/2021: Glucose 86, BUN/Cr 11/70. EGFR 105. Na/K 140/4.6. Rest of the CMP normal H/H 13/39. MCV 93. Platelets 125 HbA1C N/A Chol 173, TG 52, HDL 71, LDL 92 TSH 1.6 normal    Review of Systems  Cardiovascular:  Positive for chest pain and dyspnea on exertion. Negative for leg swelling, palpitations and syncope.        Vitals:   07/13/21 0834 07/13/21 0835  Resp:    Temp:    SpO2: 100% 100%   Orthostatic VS for the past 72 hrs (Last 3 readings):  Orthostatic BP Patient Position BP Location Cuff Size Orthostatic Pulse  07/13/21 0835 105/64 Standing Left Arm Normal 105  07/13/21 0834 112/65 Sitting Left Arm Normal 95  07/13/21 0830 115/70 Supine Left Arm Normal 81     Body mass index is 18.07 kg/m. Filed Weights   07/13/21 0830  Weight: 102 lb (46.3 kg)     Objective:   Physical Exam Vitals and nursing note reviewed.  Constitutional:      General: She is not in acute distress.    Appearance: She is well-developed.  HENT:     Head: Normocephalic and atraumatic.  Eyes:     Conjunctiva/sclera: Conjunctivae normal.     Pupils: Pupils are equal, round, and reactive to light.  Neck:     Vascular: No JVD.  Cardiovascular:     Rate and Rhythm: Normal rate and regular rhythm.     Pulses: Intact distal pulses.          Dorsalis pedis pulses are 1+ on the right side and 1+ on the left side.       Posterior tibial pulses are 1+ on the right side  and 0 on the left side.     Heart sounds: Normal heart sounds. No murmur heard. Pulmonary:     Effort: Pulmonary effort is normal.     Breath sounds: Normal breath sounds. No wheezing or rales.  Abdominal:     General: Bowel sounds are normal.     Palpations: Abdomen is soft.     Tenderness: There is no rebound.  Musculoskeletal:        General: No tenderness. Normal range of motion.     Left lower leg: No edema.  Lymphadenopathy:     Cervical: No cervical adenopathy.  Skin:    General: Skin is warm and dry.  Neurological:     Mental Status: She is alert and oriented to person, place, and time.     Cranial Nerves: No cranial nerve deficit.  Assessment & Recommendations:   50 y.o. African American female with h/o stage Ia adenocarcinoma of lung right upper lobectomy (08/2017), former heavy smoker, anemia, chronic weight loss, strong family h/o CAD and AAA, referred for chest pain  Chest pain: Stress test pending. Until then, continue aspirin and metoprolol.  Claudication: Pseudoclaudication given normal ABI and multiphasic waveform.    Abdominal aorta ectasia Minimal ectasia seen.  Repeat ultrasound in 5 years.  Dizziness: Negative orthostatics.  No significant cardiac etiology.  Reported primary team.  Prn follow up other significant abnormalities noted on stress testing  Thank you for referring the patient to [place]. Please feel free to contact with any questions.   Nigel Mormon, MD Pager: [phone] Office: [phone]

## 2021-07-13 ENCOUNTER — Encounter: Payer: Self-pay | Admitting: Cardiology

## 2021-07-13 ENCOUNTER — Other Ambulatory Visit: Payer: Self-pay

## 2021-07-13 ENCOUNTER — Ambulatory Visit: Payer: 59 | Admitting: Cardiology

## 2021-07-13 VITALS — Temp 98.0°F | Resp 16 | Ht 63.0 in | Wt 102.0 lb

## 2021-07-13 DIAGNOSIS — M48062 Spinal stenosis, lumbar region with neurogenic claudication: Secondary | ICD-10-CM

## 2021-07-13 DIAGNOSIS — R072 Precordial pain: Secondary | ICD-10-CM

## 2021-07-13 DIAGNOSIS — Z8249 Family history of ischemic heart disease and other diseases of the circulatory system: Secondary | ICD-10-CM | POA: Diagnosis not present

## 2021-07-13 DIAGNOSIS — I739 Peripheral vascular disease, unspecified: Secondary | ICD-10-CM | POA: Insufficient documentation

## 2021-07-25 ENCOUNTER — Other Ambulatory Visit: Payer: Self-pay

## 2021-07-25 ENCOUNTER — Ambulatory Visit: Payer: 59

## 2021-07-25 DIAGNOSIS — R072 Precordial pain: Secondary | ICD-10-CM | POA: Diagnosis not present

## 2021-08-04 ENCOUNTER — Other Ambulatory Visit (HOSPITAL_COMMUNITY): Payer: Self-pay

## 2021-08-04 ENCOUNTER — Other Ambulatory Visit: Payer: Self-pay

## 2021-08-04 ENCOUNTER — Encounter (HOSPITAL_COMMUNITY): Payer: Self-pay

## 2021-08-04 ENCOUNTER — Ambulatory Visit (HOSPITAL_COMMUNITY)
Admission: RE | Admit: 2021-08-04 | Discharge: 2021-08-04 | Disposition: A | Discharge status: Home / Self Care | Payer: 59 | Source: Ambulatory Visit | Attending: Emergency Medicine | Admitting: Emergency Medicine

## 2021-08-04 ENCOUNTER — Ambulatory Visit (INDEPENDENT_AMBULATORY_CARE_PROVIDER_SITE_OTHER): Payer: 59

## 2021-08-04 VITALS — BP 120/60 | HR 76 | Temp 98.5°F | Resp 18

## 2021-08-04 DIAGNOSIS — R0602 Shortness of breath: Secondary | ICD-10-CM

## 2021-08-04 DIAGNOSIS — R053 Chronic cough: Secondary | ICD-10-CM

## 2021-08-04 DIAGNOSIS — J40 Bronchitis, not specified as acute or chronic: Secondary | ICD-10-CM | POA: Diagnosis not present

## 2021-08-04 MED ORDER — PREDNISONE 10 MG PO TABS
ORAL_TABLET | Freq: Every day | ORAL | 0 refills | Status: DC
  Filled 2021-08-04: qty 42, 12d supply, fill #0

## 2021-08-04 NOTE — ED Triage Notes (Signed)
Pt is present today with SOB, left side chest pain, and left upper back pain. Pt states sx started x3 weeks ago.

## 2021-08-04 NOTE — ED Provider Notes (Signed)
[facility]    CSN: [id number] Arrival date & time: 08/04/21  0850      History   Chief Complaint Chief Complaint  Patient presents with   Shortness of Breath    HPI Heather [name] is a 50 y.o. female.   Pt has lt side rib pain and sob for several months. Seen pcp and they never addressed it nor done an x ray. Pt is concerned due to she had a partial lobectomy on the rt side 4 years ago. 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 Denies any fever, no n/v/d. Take Spiriva and albuterol daily and it only helps minimally.    Past Medical History:  Diagnosis Date   Blood dyscrasia    COPD (chronic obstructive pulmonary disease) ([place])    Current smoker    Difficult intubation    pt was told difficult intubation at 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   Headache    "weekly" (09/04/2017)   Heart murmur    Hypokalemia 10/15/2017   Lung cancer ([place]) dx'd 08/2017   Pleuritic chest pain 06/13/2018   Pre-diabetes    Pulmonary nodule 07/16/2017   Rapid resting heart rate    happens infrequently   Rib pain on right side 12/21/2017    Patient Active Problem List   Diagnosis Date Noted   Family history of abdominal aortic aneurysm (AAA) 07/13/2021   Claudication in peripheral vascular disease ([place]) 07/13/2021   Emphysema/COPD ([place]) 04/14/2021   Cough with hemoptysis 07/21/2020   Otitis externa 05/18/2020   Insomnia 02/24/2020   Depression 02/24/2020   Visual aura 12/23/2019   Precordial pain 06/13/2018   S/P lobectomy of lung 08/20/2017   Cervical adenopathy 07/04/2017   Factor V Leiden ([place]) 04/13/2015   Prediabetes 04/13/2015   History of colon polyps 04/13/2015   Nonintractable headache 11/24/2014   H/O drug rash due to contrast  05/14/2014   Loss of weight 04/02/2014   Cough 04/02/2014   Current smoker 04/02/2014   Routine health maintenance 04/02/2014   History of anemia 05/08/2007     Past Surgical History:  Procedure Laterality Date   BIOPSY  04/25/2021   Procedure: BIOPSY;  Surgeon: Doran Stabler, MD;  Location: [facility];  Service: Gastroenterology;;  Egd and [place]; 2012   COLONOSCOPY N/A 05/15/2014   Procedure: COLONOSCOPY;  Surgeon: Lafayette Dragon, MD;  Location: [facility];  Service: Endoscopy;  Laterality: N/A;   COLONOSCOPY WITH PROPOFOL N/A 04/25/2021   Procedure: COLONOSCOPY WITH PROPOFOL;  Surgeon: Doran Stabler, MD;  Location: [facility];  Service: Gastroenterology;  Laterality: N/A;   ESOPHAGOGASTRODUODENOSCOPY N/A 05/15/2014   Procedure: ESOPHAGOGASTRODUODENOSCOPY (EGD);  Surgeon: Lafayette Dragon, MD;  Location: [facility];  Service: Endoscopy;  Laterality: N/A;   ESOPHAGOGASTRODUODENOSCOPY (EGD) WITH PROPOFOL N/A 04/25/2021   Procedure: ESOPHAGOGASTRODUODENOSCOPY (EGD) WITH PROPOFOL;  Surgeon: Doran Stabler, MD;  Location: [facility];  Service: Gastroenterology;  Laterality: N/A;   LAPAROSCOPY  07/07/2011   Procedure: LAPAROSCOPY DIAGNOSTIC;  Surgeon: Elveria Royals;  Location: [facility];  Service: Gynecology;  Laterality: N/A;   LESION REMOVAL  07/07/2011   Procedure: EXCISION VAGINAL LESION;  Surgeon: Elveria Royals;  Location: [facility];  Service: Gynecology;  Laterality: N/A;   LOBECTOMY Right 08/20/2017   Procedure: RIGHT UPPER LOBE LOBECTOMY;  Surgeon: [name], MD;  Location:  [facility];  Service: Thoracic;  Laterality: Right;   LYMPH NODE DISSECTION N/A 08/20/2017   Procedure: LYMPH NODE DISSECTION;  Surgeon: [name], MD;  Location: [facility];  Service: Thoracic;  Laterality: N/A;   POLYPECTOMY  04/25/2021   Procedure: POLYPECTOMY;  Surgeon: Doran Stabler, MD;  Location: [facility];  Service: Gastroenterology;;   VAGINAL HYSTERECTOMY  ~ 2014   VIDEO ASSISTED THORACOSCOPY (VATS)/WEDGE RESECTION Right 08/20/2017   Procedure: RIGHT VIDEO ASSISTED THORACOSCOPY (VATS)/WEDGE RESECTION;   Surgeon: [name], MD;  Location: [facility];  Service: Thoracic;  Laterality: Right;    OB History   No obstetric history on file.      Home Medications    Prior to Admission medications   Medication Sig Start Date End Date Taking? Authorizing Provider  predniSONE (STERAPRED UNI-PAK 21 TAB) 10 MG (21) TBPK tablet Take by mouth daily. Take 6 tabs by mouth daily  for 2 days, then 5 tabs for 2 days, then 4 tabs for 2 days, then 3 tabs for 2 days, 2 tabs for 2 days, then 1 tab by mouth daily for 2 days 08/04/21  Yes Marney Setting, NP  albuterol (PROVENTIL HFA) 108 (90 Base) MCG/ACT inhaler Inhale 2 puffs into the lungs every 6 (six) hours as needed for wheezing or shortness of breath. 02/15/21   Aldine Contes, MD  aspirin EC 81 MG tablet Take 1 tablet (81 mg total) by mouth daily. Swallow whole. 05/12/21   Patwardhan, Reynold Bowen, MD  metoprolol succinate (TOPROL-XL) 25 MG 24 hr tablet Take 2 tablets (50 mg total) by mouth daily. Take with or immediately following a meal. 05/12/21   Patwardhan, Manish J, MD  nitroGLYCERIN (NITROSTAT) 0.4 MG SL tablet Place 1 tablet (0.4 mg total) under the tongue every 5 (five) minutes as needed for chest pain. 05/12/21   Patwardhan, Reynold Bowen, MD  ondansetron (ZOFRAN) 4 MG tablet Take 1 tablet (4 mg total) by mouth every 8 (eight) hours as needed for nausea or vomiting. 02/17/21   Danis, Estill Cotta III, MD  pantoprazole (PROTONIX) 40 MG tablet TAKE 1 TABLET (40 MG TOTAL) BY MOUTH DAILY. Patient taking differently: Take 40 mg by mouth daily as needed (acid reflux). 12/27/20 12/27/21  Gaylan Gerold, DO  tiotropium (SPIRIVA HANDIHALER) 18 MCG inhalation capsule Place 1 capsule (18 mcg total) into inhaler and inhale daily. 04/14/21   Dorethea Clan, DO    Family History Family History  Problem Relation Age of Onset   Heart disease Mother    Heart disease Father    Colon polyps Father    Breast cancer Sister 36   Colon cancer Paternal Uncle    Colon polyps  Paternal Uncle    Stomach cancer Maternal Grandmother     Social History Social History   Tobacco Use   Smoking status: Some Days    Packs/day: 0.75    Years: 34.00    Pack years: 25.50    Types: Cigarettes   Smokeless tobacco: Never   Tobacco comments:    1-3 cigarette per week currently  Vaping Use   Vaping Use: Never used  Substance Use Topics   Alcohol use: Not Currently    Alcohol/week: 0.0 standard drinks    Comment: 09/04/2017 "a mixed drink a few times/year"   Drug use: No     Allergies   Iohexol and Aspirin   Review of Systems Review of Systems  Constitutional:  Positive for fatigue. Negative for chills and  fever.  HENT:  Negative for congestion, postnasal drip, rhinorrhea, sinus pressure, sinus pain, sneezing and sore throat.   Respiratory:  Positive for cough, shortness of breath and wheezing.   Cardiovascular:        Lt side  rib pain   Gastrointestinal: Negative.   Genitourinary: Negative.   Musculoskeletal: Negative.   Skin: Negative.   Neurological: Negative.     Physical Exam Triage Vital Signs ED Triage Vitals  Enc Vitals Group     BP 08/04/21 0912 120/60     Pulse Rate 08/04/21 0912 76     Resp 08/04/21 0912 18     Temp 08/04/21 0912 98.5 F (36.9 C)     Temp src --      SpO2 08/04/21 0912 98 %     Weight --      Height --      Head Circumference --      Peak Flow --      Pain Score 08/04/21 0914 8     Pain Loc --      Pain Edu? --      Excl. in [place]? --    No data found.  Updated Vital Signs BP 120/60   Pulse 76   Temp 98.5 F (36.9 C)   Resp 18   SpO2 98%   Visual Acuity Right Eye Distance:   Left Eye Distance:   Bilateral Distance:    Right Eye Near:   Left Eye Near:    Bilateral Near:     Physical Exam Constitutional:      Appearance: She is well-developed.  Cardiovascular:     Rate and Rhythm: Normal rate.  Pulmonary:     Breath sounds: No stridor. Examination of the right-lower field reveals decreased breath  sounds. Examination of the left-lower field reveals wheezing. Decreased breath sounds and wheezing present.     Comments: Lobectomy to rt upper and mid.  Chest:     Chest wall: Tenderness present.  Musculoskeletal:        General: Normal range of motion.     Cervical back: Normal range of motion.  Skin:    General: Skin is warm.     Capillary Refill: Capillary refill takes less than 2 seconds.  Neurological:     General: No focal deficit present.     Mental Status: She is alert.     UC Treatments / Results  Labs (all labs ordered are listed, but only abnormal results are displayed) Labs Reviewed - No data to display  EKG   Radiology DG Chest 2 View  Result Date: 08/04/2021 CLINICAL DATA:  50 year old female with history of lung cancer status post lobectomy. Shortness of breath increasing over the past 3 months. EXAM: CHEST - 2 VIEW COMPARISON:  Chest x-ray 03/25/2020. FINDINGS: Postoperative changes of right upper lobectomy are noted with compensatory hyperexpansion of the right middle and lower lobes, as well as some chronic pleuroparenchymal thickening in the apex of the right hemithorax which appears similar to the prior studies. Lung volumes are normal. No consolidative airspace disease. No pleural effusions. No pneumothorax. No pulmonary nodule or mass noted. Pulmonary vasculature and the cardiomediastinal silhouette are within normal limits. Atherosclerosis in the thoracic aorta. IMPRESSION: 1. No radiographic evidence of acute cardiopulmonary disease. 2. Aortic atherosclerosis. 3. Status post right upper lobectomy. Electronically Signed   By: Vinnie Langton M.D.   On: 08/04/2021 09:46    Procedures Procedures (including critical care time)  Medications Ordered in UC  Medications - No data to display  Initial Impression / Assessment and Plan / UC Course  I have reviewed the triage vital signs and the nursing notes.  Pertinent labs & imaging results that were available  during my care of the patient were reviewed by me and considered in my medical decision making (see chart for details).     Xray was normal  You will need to see pulmonary  Will need to start steroids to help with inflammation  If becomes worse go to er    Final Clinical Impressions(s) / UC Diagnoses   Final diagnoses:  Shortness of breath  Bronchitis  Chronic cough     Discharge Instructions      Follow up with pulmonary   X ray was normal        ED Prescriptions     Medication Sig Dispense Auth. Provider   predniSONE (STERAPRED UNI-PAK 21 TAB) 10 MG (21) TBPK tablet Take by mouth daily. Take 6 tabs by mouth daily  for 2 days, then 5 tabs for 2 days, then 4 tabs for 2 days, then 3 tabs for 2 days, 2 tabs for 2 days, then 1 tab by mouth daily for 2 days 42 tablet Marney Setting, NP      PDMP not reviewed this encounter.   Marney Setting, NP 08/04/21 1016

## 2021-08-04 NOTE — Discharge Instructions (Addendum)
Follow up with pulmonary   X ray was normal

## 2021-08-30 ENCOUNTER — Telehealth: Payer: Self-pay

## 2021-08-30 NOTE — Telephone Encounter (Signed)
Okay to discontinue. Continue follow up with PCP in case if she needs it again in future for high blood pressure.  Thanks MJP

## 2021-08-30 NOTE — Telephone Encounter (Signed)
Patient called and stated that she finished her heart medication (metoprolol) and wants to know if you want her to continue them or was that a temporary medication? Please advise.

## 2021-08-31 NOTE — Telephone Encounter (Signed)
Called patient, Heather [name], LMAM

## 2021-08-31 NOTE — Telephone Encounter (Signed)
Patient called back, she showed understanding.

## 2021-09-19 NOTE — Telephone Encounter (Signed)
error 

## 2021-09-28 ENCOUNTER — Encounter: Payer: Self-pay | Admitting: Pharmacist

## 2021-10-12 ENCOUNTER — Other Ambulatory Visit (HOSPITAL_COMMUNITY): Payer: Self-pay

## 2021-10-12 ENCOUNTER — Ambulatory Visit: Payer: 59 | Admitting: Student

## 2021-10-12 ENCOUNTER — Other Ambulatory Visit: Payer: Self-pay | Admitting: Internal Medicine

## 2021-10-12 ENCOUNTER — Encounter: Payer: Self-pay | Admitting: Student

## 2021-10-12 ENCOUNTER — Other Ambulatory Visit: Payer: Self-pay

## 2021-10-12 VITALS — BP 118/74 | HR 77 | Temp 98.1°F | Ht 63.0 in | Wt 104.0 lb

## 2021-10-12 DIAGNOSIS — J399 Disease of upper respiratory tract, unspecified: Secondary | ICD-10-CM | POA: Diagnosis not present

## 2021-10-12 DIAGNOSIS — J069 Acute upper respiratory infection, unspecified: Secondary | ICD-10-CM | POA: Diagnosis not present

## 2021-10-12 DIAGNOSIS — F32A Depression, unspecified: Secondary | ICD-10-CM

## 2021-10-12 DIAGNOSIS — R6881 Early satiety: Secondary | ICD-10-CM

## 2021-10-12 DIAGNOSIS — Z1231 Encounter for screening mammogram for malignant neoplasm of breast: Secondary | ICD-10-CM

## 2021-10-12 MED ORDER — BENZONATATE 100 MG PO CAPS
100.0000 mg | ORAL_CAPSULE | Freq: Three times a day (TID) | ORAL | 0 refills | Status: DC | PRN
  Filled 2021-10-12: qty 30, 10d supply, fill #0

## 2021-10-12 NOTE — Patient Instructions (Addendum)
Regarding your symptoms. We will start you on tessalon perles for your cough, in addition please use mucinex DM for the cough which you can get over the counter. And a sinus rinse like neil sinus rinse.

## 2021-10-13 LAB — COVID-19, FLU A+B NAA
Influenza A, NAA: NOT DETECTED
Influenza B, NAA: NOT DETECTED
SARS-CoV-2, NAA: NOT DETECTED

## 2021-10-14 ENCOUNTER — Encounter: Payer: Self-pay | Admitting: Student

## 2021-10-14 DIAGNOSIS — J069 Acute upper respiratory infection, unspecified: Secondary | ICD-10-CM | POA: Insufficient documentation

## 2021-10-14 NOTE — Assessment & Plan Note (Addendum)
Patient reports that for the last couple weeks she has been experiencing a sore throat, runny nose, muscle aches, joint pain, and a productive cough of light green sputum.  She also noticed that she has intermittently had a fever at that responds to ibuprofen or Tylenol.  She has tried taking over-the-counter Robitussin, Mucinex, and drinking hot tea to help her symptoms to minimal effect.  She has been unable to test herself for COVID or flu during this time.  She states she has no issues with drinking fluids but when eating she does have some early satiety (Of note, she had a recent EGD which was unremarkable).  At baseline patient's O2 saturations are 96% and above.  She states during the last couple weeks her O2 saturations have remained at this level.  She has been vaccinated for COVID and for the flu. 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 On exam her O2 sats are 100% and she is afebrile.    Assessment and plan: Patient's symptoms are most likely related to a viral upper respiratory tract infection.  Discussed with the patient that she should continue Mucinex DM, use Tessalon Perles for cough, and a sinus rinse.  Patient stated that she will try these and give [place] a call back in 1 week if her symptoms do not resolve. Her COVID and flu test are negative this visit.

## 2021-10-14 NOTE — Assessment & Plan Note (Addendum)
Patient reports persistent early satiety.  She was evaluated with an EGD and colonoscopy in 04/2021.  Her EGD was unremarkable except however her colonoscopy was remarkable for a colonic tubular adenomatous polyps.  She was to follow-up with gastroenterology for a gastric emptying study.  It does not appear that this has been completed.  At next clinic visit discussed with patient setting up this appointment with her symptoms as patient's BMI remains low and this could potentially help with weight gain.

## 2021-10-14 NOTE — Addendum Note (Signed)
Addended by: Althea Grimmer on: 10/14/2021 03:47 PM   Modules accepted: Orders

## 2021-10-14 NOTE — Progress Notes (Signed)
° °  CC: Viral URI symptoms  HPI:  [name] is a 51 y.o. F with PMH per below who presents for sore throat, runny nose, myalgias, and headache. Please see problem based charting under encounters tab for further details.    Past Medical History:  Diagnosis Date   Blood dyscrasia    COPD (chronic obstructive pulmonary disease) ([place])    Current smoker    Difficult intubation    pt was told difficult int[facility] at [facility]   Factor V deficiency [facility])    protein deficiency   Headache    "weekly" (09/04/2017)   Heart murmur    Hypokalemia 10/15/2017   Lung cancer ([place]) dx'd 08/2017   Pleuritic chest pain 06/13/2018   Pre-diabetes    Pulmonary nodule 07/16/2017   Rapid resting heart rate    happens infrequently   Rib pain on right side 12/21/2017   Review of Systems:  Please see problem based charting under encounters tab for further details.    Physical Exam:  Vitals:   10/12/21 1425  BP: 118/74  Pulse: 77  Temp: 98.1 F (36.7 C)  TempSrc: Oral  SpO2: 100%  Weight: 104 lb (47.2 kg)  Height: 5\' 3"  (1.6 m)   Constitutional: Thin and in no distress.  HENT:  Head: Normocephalic and atraumatic.  Eyes: EOM are normal.  Neck: Normal range of motion, no palpable lymphnodes  Throat: Mild erythema of oropharynx, mild cobblestoning appearance, no exudate present  Cardiovascular: Normal rate, regular rhythm, normal heart sounds and intact distal pulses. Exam reveals no gallop and no friction rub.  No murmur heard. Pulmonary/Chest: Effort normal and breath sounds normal. No respiratory distress. He exhibits no tenderness.  Abdominal: Soft. Bowel sounds are normal. Non distended, non tender to palpation Musculoskeletal: Normal range of motion.        General: No tenderness or edema.  Neurological: Non focal  Skin: Skin is warm and dry.    Assessment & Plan:   See Encounters Tab for problem based charting.  Patient discussed with Dr. Jimmye Norman

## 2021-10-20 ENCOUNTER — Encounter: Payer: Self-pay | Admitting: Internal Medicine

## 2021-10-28 NOTE — Progress Notes (Signed)
Internal Medicine Clinic Attending  Case discussed with Dr. Carter  At the time of the visit.  We reviewed the resident's history and exam and pertinent patient test results.  I agree with the assessment, diagnosis, and plan of care documented in the resident's note.  

## 2021-11-10 ENCOUNTER — Ambulatory Visit: Payer: 59 | Admitting: Behavioral Health

## 2021-11-10 DIAGNOSIS — F4329 Adjustment disorder with other symptoms: Secondary | ICD-10-CM

## 2021-11-10 DIAGNOSIS — F331 Major depressive disorder, recurrent, moderate: Secondary | ICD-10-CM

## 2021-11-10 DIAGNOSIS — F4381 Prolonged grief disorder: Secondary | ICD-10-CM

## 2021-11-10 DIAGNOSIS — F419 Anxiety disorder, unspecified: Secondary | ICD-10-CM
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
## 2021-11-21 ENCOUNTER — Ambulatory Visit
Admission: RE | Admit: 2021-11-21 | Discharge: 2021-11-21 | Disposition: A | Discharge status: Home / Self Care | Payer: 59 | Source: Ambulatory Visit | Attending: Internal Medicine | Admitting: Internal Medicine

## 2021-11-21 DIAGNOSIS — Z1231 Encounter for screening mammogram for malignant neoplasm of breast: Secondary | ICD-10-CM

## 2021-11-24 ENCOUNTER — Other Ambulatory Visit: Payer: Self-pay

## 2021-11-24 ENCOUNTER — Institutional Professional Consult (permissible substitution): Payer: 59 | Admitting: Behavioral Health

## 2021-12-19 ENCOUNTER — Telehealth: Payer: Self-pay | Admitting: Behavioral Health

## 2021-12-19 ENCOUNTER — Institutional Professional Consult (permissible substitution): Payer: 59 | Admitting: Behavioral Health

## 2021-12-19 NOTE — Telephone Encounter (Signed)
Lft 3 msgs for Pt re: today's IBH telehealth session. Directed Pt about the importance of f/u today & her need to r/s IBH appt to time that works best for her.  ? ?Dr. Theodis Shove ?

## 2021-12-28 ENCOUNTER — Other Ambulatory Visit (HOSPITAL_COMMUNITY): Payer: Self-pay

## 2021-12-28 ENCOUNTER — Ambulatory Visit (HOSPITAL_COMMUNITY)
Admission: RE | Admit: 2021-12-28 | Discharge: 2021-12-28 | Disposition: A | Discharge status: Home / Self Care | Payer: 59 | Source: Ambulatory Visit | Attending: Family Medicine | Admitting: Family Medicine

## 2021-12-28 ENCOUNTER — Encounter (HOSPITAL_COMMUNITY): Payer: Self-pay

## 2021-12-28 ENCOUNTER — Ambulatory Visit (INDEPENDENT_AMBULATORY_CARE_PROVIDER_SITE_OTHER): Payer: 59

## 2021-12-28 VITALS — BP 112/75 | HR 75 | Temp 98.2°F | Resp 17

## 2021-12-28 DIAGNOSIS — R062 Wheezing: Secondary | ICD-10-CM | POA: Diagnosis not present

## 2021-12-28 DIAGNOSIS — R059 Cough, unspecified: Secondary | ICD-10-CM

## 2021-12-28 DIAGNOSIS — R051 Acute cough: Secondary | ICD-10-CM | POA: Diagnosis not present

## 2021-12-28 DIAGNOSIS — R0602 Shortness of breath: Secondary | ICD-10-CM

## 2021-12-28 DIAGNOSIS — G8929 Other chronic pain: Secondary | ICD-10-CM

## 2021-12-28 DIAGNOSIS — R079 Chest pain, unspecified: Secondary | ICD-10-CM | POA: Diagnosis not present

## 2021-12-28 DIAGNOSIS — M546 Pain in thoracic spine: Secondary | ICD-10-CM

## 2021-12-28 DIAGNOSIS — M545 Low back pain, unspecified: Secondary | ICD-10-CM | POA: Diagnosis not present

## 2021-12-28 MED ORDER — PREDNISONE 20 MG PO TABS
40.0000 mg | ORAL_TABLET | Freq: Every day | ORAL | 0 refills | Status: DC
  Filled 2021-12-28: qty 10, 5d supply, fill #0

## 2021-12-28 MED ORDER — HYDROCODONE BIT-HOMATROP MBR 5-1.5 MG/5ML PO SOLN
5.0000 mL | Freq: Four times a day (QID) | ORAL | 0 refills | Status: DC | PRN
  Filled 2021-12-28: qty 90, 5d supply, fill #0

## 2021-12-28 NOTE — ED Triage Notes (Signed)
Pt reports SOB, cough and lower back pain.  ? ?Pt states she does not know where it comes from.  ?

## 2021-12-28 NOTE — Discharge Instructions (Signed)
Be aware, your cough medication may cause drowsiness. Please do not drive, operate heavy machinery or make important decisions while on this medication, it can cloud your judgement.  

## 2021-12-29 ENCOUNTER — Other Ambulatory Visit (HOSPITAL_COMMUNITY): Payer: Self-pay

## 2021-12-29 NOTE — ED Provider Notes (Signed)
?[place] ? ? ?[id number] ?12/28/21 Arrival Time: 2355 ? ?ASSESSMENT & PLAN: ? ?1. Chronic left-sided thoracic back pain   ?2. Acute cough   ?3. Wheezing   ?Acute on chronic back pain. She questions COPD involvement. ? ?I have personally viewed the imaging studies ordered this visit. ?Prior RUL lobectomy. No pneumothorax. No signs of infection. ? ?No respiratory distress. ?Able to ambulate here and hemodynamically stable. ?No indication for imaging of back at this time given no trauma and normal neurological exam. Discussed. ? ?Meds ordered this encounter  ?Medications  ? HYDROcodone bit-homatropine (HYCODAN) 5-1.5 MG/5ML syrup  ?  Sig: Take 5 mLs by mouth every 6 (six) hours as needed for cough.  ?  Dispense:  90 mL  ?  Refill:  0  ? predniSONE (DELTASONE) 20 MG tablet  ?  Sig: Take 2 tablets (40 mg total) by mouth daily.  ?  Dispense:  10 tablet  ?  Refill:  0  ? ? ?Medication sedation precautions given. ?Encourage ROM/movement as tolerated. ? ?Recommend: ? Follow-up Information   ? ? Aldine Contes, MD.   ?Specialty: Internal Medicine ?Why: As needed. ?Contact information: ?[address], [address] ?[place] [address] ?[phone] ? ? ?  ?  ? ?  ?  ? ?  ? ? ?Reviewed expectations re: course of current medical issues. Questions answered. ?Outlined signs and symptoms indicating need for more acute intervention. ?Patient verbalized understanding. ?After Visit Summary given. ? ? ?SUBJECTIVE: ?History from: patient. ? ?Kellye Mizner is a 51 y.o. female who presents with complaint of acute on chronic thoracic back pain; no trauma; exacerbation over past few days. With occas non-prod cough and SOB. No specific CP. OTC analgesics not helping. Cough interfering with sleep. ? ?OBJECTIVE: ? ?Vitals:  ? 12/28/21 1453  ?BP: 112/75  ?Pulse: 75  ?Resp: 17  ?Temp: 98.2 ?F (36.8 ?C)  ?TempSrc: Oral  ?SpO2: 98%  ?  ?General appearance: alert; no distress ?HEENT: [place]; AT ?Neck: supple with FROM; without  midline tenderness ?CV: regular ?Lungs: unlabored respirations; speaks full sentences without difficulty ?Abdomen: soft, non-tender; non-distended ?Back: mild  and poorly localized tenderness to palpation over thoracic musculature of back ; FROM at waist; bruising: none; without midline tenderness ?Extremities: without edema; symmetrical without gross deformities; normal ROM of bilateral LE ?Skin: warm and dry ?Neurologic: normal gait; normal sensation and strength of bilateral LE ?Psychological: alert and cooperative; normal mood and affect ? ?Imaging: ?DG Chest 2 View ? ?Result Date: 12/28/2021 ?CLINICAL DATA:  Shortness of breath, cough and lower back pain EXAM: CHEST - 2 VIEW COMPARISON:  August 04, 2021 chest radiograph FINDINGS: The heart size and mediastinal contours are within normal limits. Aortic atherosclerosis. Postoperative changes of right upper lobectomy with compensatory hypertrophy of the right middle and lower lobes as well as chronic right apical pleural thickening. Chain staples and surgical clips overlie the right lung apex and superior right hilum. No focal airspace consolidation. No pleural effusion. No pneumothorax. No acute osseous abnormality. IMPRESSION: No acute cardiopulmonary abnormality. Stable changes of prior right upper lobectomy Electronically Signed   By: Dahlia Bailiff M.D.   On: 12/28/2021 15:41  ? ? ?Allergies  ?Allergen Reactions  ? Iohexol Shortness Of Breath, Swelling and Rash  ?  Patient indicates she had swelling all over her body as well as shortness of breath.  Even when she was premedicated, she broke out in a full body rash.  ? Aspirin Nausea And Vomiting  ? ? ?  Past Medical History:  ?Diagnosis Date  ? Blood dyscrasia   ? COPD (chronic obstructive pulmonary disease) ([place])   ? Current smoker   ? Difficult intubation   ? pt was told difficult intubation at [facility]  ? 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 protein deficiency  ? Headache   ? "weekly" (09/04/2017)  ? Heart  murmur   ? Hypokalemia 10/15/2017  ? Lung adenocarcinoma dx'd 08/2017  ? s/p RU lobectomy  ? Pleuritic chest pain 06/13/2018  ? Pre-diabetes   ? Pulmonary nodule 07/16/2017  ? Rapid resting heart rate   ? happens infrequently  ? Rib pain on right side 12/21/2017  ? ?Social History  ? ?Socioeconomic History  ? Marital status: Divorced  ?  Spouse name: Not on file  ? Number of children: Not on file  ? Years of education: Not on file  ? Highest education level: Not on file  ?Occupational History  ? Not on file  ?Tobacco Use  ? Smoking status: Some Days  ?  Packs/day: 0.75  ?  Years: 34.00  ?  Pack years: 25.50  ?  Types: Cigarettes  ? Smokeless tobacco: Never  ? Tobacco comments:  ?  1-3 cigarette per week currently  ?Vaping Use  ? Vaping Use: Never used  ?Substance and Sexual Activity  ? Alcohol use: Not Currently  ?  Alcohol/week: 0.0 standard drinks  ?  Comment: 09/04/2017 "a mixed drink a few times/year"  ? Drug use: No  ? Sexual activity: Not Currently  ?Other Topics Concern  ? Not on file  ?Social History Narrative  ? Not on file  ? ?Social Determinants of Health  ? ?Financial Resource Strain: Not on file  ?Food Insecurity: Not on file  ?Transportation Needs: Not on file  ?Physical Activity: Not on file  ?Stress: Not on file  ?Social Connections: Not on file  ?Intimate Partner Violence: Not on file  ? ?Family History  ?Problem Relation Age of Onset  ? Heart disease Mother   ? Heart disease Father   ? Colon polyps Father   ? Breast cancer Sister 10  ? Colon cancer Paternal Uncle   ? Colon polyps Paternal Uncle   ? Stomach cancer Maternal Grandmother   ? ?Past Surgical History:  ?Procedure Laterality Date  ? BIOPSY  04/25/2021  ? Procedure: BIOPSY;  Surgeon: Doran Stabler, MD;  Location: [facility];  Service: Gastroenterology;;  Egd and COLON  ? CESAREAN SECTION  1992; 2012  ? COLONOSCOPY N/A 05/15/2014  ? Procedure: COLONOSCOPY;  Surgeon: Lafayette Dragon, MD;  Location: [facility];  Service: Endoscopy;   Laterality: N/A;  ? COLONOSCOPY WITH PROPOFOL N/A 04/25/2021  ? Procedure: COLONOSCOPY WITH PROPOFOL;  Surgeon: Doran Stabler, MD;  Location: [facility];  Service: Gastroenterology;  Laterality: N/A;  ? ESOPHAGOGASTRODUODENOSCOPY N/A 05/15/2014  ? Procedure: ESOPHAGOGASTRODUODENOSCOPY (EGD);  Surgeon: Lafayette Dragon, MD;  Location: [facility];  Service: Endoscopy;  Laterality: N/A;  ? ESOPHAGOGASTRODUODENOSCOPY (EGD) WITH PROPOFOL N/A 04/25/2021  ? Procedure: ESOPHAGOGASTRODUODENOSCOPY (EGD) WITH PROPOFOL;  Surgeon: Doran Stabler, MD;  Location: [facility];  Service: Gastroenterology;  Laterality: N/A;  ? LAPAROSCOPY  07/07/2011  ? Procedure: LAPAROSCOPY DIAGNOSTIC;  Surgeon: Elveria Royals;  Location: [facility];  Service: Gynecology;  Laterality: N/A;  ? LESION REMOVAL  07/07/2011  ? Procedure: EXCISION VAGINAL LESION;  Surgeon: Elveria Royals;  Location: [facility];  Service: Gynecology;  Laterality: N/A;  ? LOBECTOMY Right 08/20/2017  ?  Procedure: RIGHT UPPER LOBE LOBECTOMY;  Surgeon: [name], MD;  Location: [facility];  Service: Thoracic;  Laterality: Right;  ? LYMPH NODE DISSECTION N/A 08/20/2017  ? Procedure: LYMPH NODE DISSECTION;  Surgeon: [name], MD;  Location: [facility];  Service: Thoracic;  Laterality: N/A;  ? POLYPECTOMY  04/25/2021  ? Procedure: POLYPECTOMY;  Surgeon: Doran Stabler, MD;  Location: [facility];  Service: Gastroenterology;;  ? VAGINAL HYSTERECTOMY  ~ 2014  ? VIDEO ASSISTED THORACOSCOPY (VATS)/WEDGE RESECTION Right 08/20/2017  ? Procedure: RIGHT VIDEO ASSISTED THORACOSCOPY (VATS)/WEDGE RESECTION;  Surgeon: [name], MD;  Location: [facility];  Service: Thoracic;  Laterality: Right;  ? ? ?  ?Vanessa Kick, MD ?12/29/21 1204 ? ?

## 2022-01-17 ENCOUNTER — Ambulatory Visit: Payer: 59 | Admitting: Behavioral Health

## 2022-01-17 DIAGNOSIS — F331 Major depressive disorder, recurrent, moderate: Secondary | ICD-10-CM

## 2022-01-17 DIAGNOSIS — F419 Anxiety disorder, unspecified: Secondary | ICD-10-CM
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
## 2022-01-25 ENCOUNTER — Telehealth: Payer: Self-pay

## 2022-01-25 NOTE — Telephone Encounter (Signed)
Please make an appt in next two weeks w/me or CC ? ?Thanks ?MJP ? ?

## 2022-01-25 NOTE — Telephone Encounter (Signed)
Pt called and stated that she has been having chest heaviness and pain. She has chest pain and is SOB when she gets up to walk. Her oxygen level has been running around 98%. She has not checked her BP recently but that last time she did it was 134/82. She is currently at work and said she does not feel it is an emergency and wants to come on for an appointment. Please advise.  ? ?134/82 ?

## 2022-02-03 ENCOUNTER — Ambulatory Visit: Payer: 59 | Admitting: Student

## 2022-02-03 ENCOUNTER — Encounter: Payer: Self-pay | Admitting: Student

## 2022-02-03 ENCOUNTER — Other Ambulatory Visit (HOSPITAL_COMMUNITY): Payer: Self-pay

## 2022-02-03 VITALS — BP 129/84 | HR 88 | Temp 98.0°F | Resp 16 | Ht 63.0 in | Wt 104.0 lb

## 2022-02-03 DIAGNOSIS — R072 Precordial pain: Secondary | ICD-10-CM | POA: Diagnosis not present

## 2022-02-03 MED ORDER — PREDNISONE 50 MG PO TABS
50.0000 mg | ORAL_TABLET | ORAL | 0 refills | Status: DC
  Filled 2022-02-03: qty 3, 1d supply, fill #0

## 2022-02-03 NOTE — Progress Notes (Signed)
? ? ?Patient referred by Aldine Contes, MD for chest pain ? ?Subjective:  ? ?Heather [name], female    DOB: 08/13/1971, 51 y.o.   MRN: [id number] ? ? ?Chief Complaint  ?Patient presents with  ? Chest Pain  ? Shortness of Breath  ? Follow-up  ? ? ? ?HPI ? ?51 y.o. African American female with h/o stage Ia adenocarcinoma of lung right upper lobectomy (08/2017), anemia, chronic weight loss.  ? ?Patient was previously evaluated by Dr. Virgina Jock 07/13/2021 for chest pain.  At that time echocardiogram showed structurally normal heart, mild tricuspid regurgitation.  Abdominal aorta duplex showed mild plaque in the mid to distal aorta with mild ectasia in proximal and distal aorta measuring approximately 2.4 cm.  Lower extremity arterial duplex showed multiphasic waveforms at both ankles, suggestive of no significant PAD.  Stress test was overall low risk.  Patient was therefore advised to follow-up as needed. ? ?Patient now presents today with complaints of worsening chest pain over the last several months.  Reports pain is sternal and constant, worse with certain movements and some exertion.  She describes the pain as pleuritic.  Denies palpitations, syncope, near syncope.  She states she has had episodes of shortness of breath as well.   ? ?She has significantly reduced smoking, now smoking 2 to 3 cigarettes/week. ? ?Current Outpatient Medications on File Prior to Visit  ?Medication Sig Dispense Refill  ? albuterol (PROVENTIL HFA) 108 (90 Base) MCG/ACT inhaler Inhale 2 puffs into the lungs every 6 (six) hours as needed for wheezing or shortness of breath. 8.5 g 2  ? nitroGLYCERIN (NITROSTAT) 0.4 MG SL tablet Place 1 tablet (0.4 mg total) under the tongue every 5 (five) minutes as needed for chest pain. 25 tablet 1  ? ondansetron (ZOFRAN) 4 MG tablet Take 1 tablet (4 mg total) by mouth every 8 (eight) hours as needed for nausea or vomiting. 30 tablet 1  ? tiotropium (SPIRIVA HANDIHALER) 18 MCG inhalation capsule  Place 1 capsule (18 mcg total) into inhaler and inhale daily. 90 capsule 0  ? ?No current facility-administered medications on file prior to visit.  ? ? ?Cardiovascular and other pertinent studies: ?EKG 02/03/2022:  ?Sinus rhythm at a rate of 86 bpm.  Normal axis.  Nonspecific T wave abnormality.  No evidence of ischemia or underlying injury pattern. ? ?PCV MYOCARDIAL PERFUSION WO LEXISCAN 07/25/2021 ?  The study is normal. The study is low risk. ?  LV perfusion is normal. ?  Left ventricular function is normal. The left ventricular ejection fraction is normal (55-65%). End diastolic cavity size is normal. ?  Prior study not available for comparison. ? ?Lexiscan (with Mod Bruce protocol) Nuclear stress test 07/25/2021: ?Nondiagnostic ECG stress due to pharmacologic stress. Stress symptoms included non limiting chest pain and dyspnea. Additionally, patient exercised on a Modified Bruce protocol achieving 2.27 METS. ?Overall LV systolic function is normal without regional wall motion abnormalities. ?Myocardial perfusion is normal. ?Stress LV EF: 67%. No previous exam available for comparison. Low risk study. ? ?Abdominal Aortic Duplex 05/26/2021:  ?Mild plaque noted in the mid and distal aorta. There is ectasia noted in  ?the proximal and distal aorta measuring approximately 2.4 cm. Recommend re  ?evaluation in 5 years for stability.  ?Normal iliac artery velocity.  ? ?Lower Extremity Arterial Duplex 05/26/2021:  ?No hemodynamically significant stenoses are identified in the right lower  ?extremity arterial system. No hemodynamically significant stenoses are  ?identified in the left lower extremity arterial system.  ?  This exam reveals normal perfusion of the right and left  lower extremity  ?(ABI 1.00) with multiphasic waveforms at both ankles. Evaluate for pseudo  ?claudication. ? ?Echocardiogram 05/20/2021:  ?Left ventricle cavity is normal in size and wall thickness. Normal global  ?wall motion. Normal LV systolic  function with EF 67%. Normal diastolic  ?filling pattern.  ?Mild tricuspid regurgitation.  ?No evidence of pulmonary hypertension. ? ?EKG 05/12/2021: ?Sinus rhythm 81 bpm  ?RSR(V1) -nondiagnostic ?Borderline right atrial enlargement ? ? ?Recent labs: ? ?  Latest Ref Rng & Units 02/15/2021  ? 10:01 AM 07/22/2020  ?  9:53 AM 03/25/2020  ? 12:13 PM  ?CMP  ?Glucose 65 - 99 mg/dL 86    93    ?BUN 6 - 24 mg/dL 11    9    ?Creatinine 0.57 - 1.00 mg/dL 0.70   0.70   0.73    ?Sodium 134 - 144 mmol/L 140    139    ?Potassium 3.5 - 5.2 mmol/L 4.6    4.0    ?Chloride 96 - 106 mmol/L 101    103    ?CO2 20 - 29 mmol/L 20    25    ?Calcium 8.7 - 10.2 mg/dL 9.4    9.6    ?Total Protein 6.0 - 8.5 g/dL 7.2      ?Total Bilirubin 0.0 - 1.2 mg/dL 0.3      ?Alkaline Phos 44 - 121 IU/L 65      ?AST 0 - 40 IU/L 17      ?ALT 0 - 32 IU/L 8      ? ? ?  Latest Ref Rng & Units 02/15/2021  ? 10:01 AM 03/25/2020  ? 12:13 PM 03/25/2020  ?  9:28 AM  ?CBC  ?WBC 3.4 - 10.8 x10E3/uL 7.0   7.1   7.9    ?Hemoglobin 11.1 - 15.9 g/dL 13.5   13.8   14.7    ?Hematocrit 34.0 - 46.6 % 39.7   41.5   44.5    ?Platelets 150 - 450 x10E3/uL 125   133   147    ? ?Lipid Panel  ?   ?Component Value Date/Time  ? CHOL 173 02/15/2021 1001  ? TRIG 52 02/15/2021 1001  ? HDL 71 02/15/2021 1001  ? CHOLHDL 2.4 02/15/2021 1001  ? CHOLHDL 2.7 04/02/2014 0932  ? VLDL 10 04/02/2014 0932  ? [place] 92 02/15/2021 1001  ? ?HEMOGLOBIN A1C ?Lab Results  ?Component Value Date  ? HGBA1C 5.5 09/03/2017  ? MPG 111.15 09/03/2017  ? ?TSH ?Recent Labs  ?  02/15/21 ?1001  ?TSH 1.670  ? ?02/15/2021: ?Glucose 86, BUN/Cr 11/70. EGFR 105. Na/K 140/4.6. Rest of the CMP normal ?H/H 13/39. MCV 93. Platelets 125 ?HbA1C N/A ?Chol 173, TG 52, HDL 71, LDL 92 ?TSH 1.6 normal ? ? ? ?Review of Systems  ?Cardiovascular:  Positive for chest pain and dyspnea on exertion. Negative for leg swelling, palpitations and syncope.  ? ?   ? ? ?Vitals:  ? 02/03/22 1406  ?BP: 129/84  ?Pulse: 88  ?Resp: 16  ?Temp: 98 ?F (36.7  ?C)  ?SpO2: 99%  ? ?Orthostatic VS for the past 72 hrs (Last 3 readings): ? Patient Position BP Location Cuff Size  ?02/03/22 1406 Sitting Left Arm Normal  ? ? ? ?Body mass index is 18.42 kg/m?. ?Filed Weights  ? 02/03/22 1406  ?Weight: 104 lb (47.2 kg)  ? ? ? ?Objective:  ? Physical Exam ?Vitals reviewed.  ?  Constitutional:   ?   General: She is not in acute distress. ?   Appearance: She is well-developed.  ?Neck:  ?   Vascular: No JVD.  ?Cardiovascular:  ?   Rate and Rhythm: Normal rate and regular rhythm.  ?   Pulses: Intact distal pulses.     ?     Dorsalis pedis pulses are 1+ on the right side and 1+ on the left side.  ?     Posterior tibial pulses are 1+ on the right side and 0 on the left side.  ?   Heart sounds: Normal heart sounds. No murmur heard. ?Pulmonary:  ?   Effort: Pulmonary effort is normal.  ?   Breath sounds: Normal breath sounds. No wheezing or rales.  ?Chest:  ? ? ?Musculoskeletal:  ?   Right lower leg: No edema.  ?   Left lower leg: No edema.  ?Lymphadenopathy:  ?   Cervical: No cervical adenopathy.  ?Neurological:  ?   Mental Status: She is alert.  ? ? ? ?   ? ?Assessment & Recommendations:  ? ?51 y.o. African American female with h/o stage Ia adenocarcinoma of lung right upper lobectomy (08/2017), former heavy smoker, anemia, chronic weight loss, strong family h/o CAD and AAA, referred for chest pain ? ?Chest pain: ?EKG is without acute ischemic changes.  Stress testing approximately 6 months ago was overall low risk.  Again reviewed and discussed results of previous cardiac testing.  My suspicion is low that chest pain is cardiac, however it does have both typical and atypical characteristics.  We will therefore obtain stat troponin, will notify patient immediately if this is abnormal.  We will obtain previously ordered coronary calcium score to further risk stratify patient.  ? ?Patient's physical exam is consistent with musculoskeletal chest pain, costochondritis.  Advised patient  regarding conservative measures and advised her to follow-up with PCP if pain does not improve with the use interventions. ? ?Abdominal aorta ectasia ?Minimal ectasia seen.  Repeat ultrasound in 5 years. ? ?Further r

## 2022-02-04 LAB — BASIC METABOLIC PANEL
BUN/Creatinine Ratio: 9 (ref 9–23)
BUN: 8 mg/dL (ref 6–24)
CO2: 22 mmol/L (ref 20–29)
Calcium: 9.7 mg/dL (ref 8.7–10.2)
Chloride: 101 mmol/L (ref 96–106)
Creatinine, Ser: 0.88 mg/dL (ref 0.57–1.00)
Glucose: 95 mg/dL (ref 70–99)
Potassium: 4.3 mmol/L (ref 3.5–5.2)
Sodium: 139 mmol/L (ref 134–144)
eGFR: 79 mL/min/{1.73_m2} (ref >59)

## 2022-02-04 LAB — TROPONIN T: Troponin T (Highly Sensitive): 7 ng/L (ref 0–14)

## 2022-02-06 ENCOUNTER — Ambulatory Visit: Payer: 59 | Admitting: Behavioral Health

## 2022-02-06 DIAGNOSIS — F331 Major depressive disorder, recurrent, moderate: Secondary | ICD-10-CM

## 2022-02-06 DIAGNOSIS — F419 Anxiety disorder, unspecified: Secondary | ICD-10-CM

## 2022-02-06 NOTE — BH Specialist Note (Signed)
Integrated Behavioral Health via Telemedicine Visit ? ?02/06/2022 ?[name] ?[id number] ? ?Number of [facility] Clinician visits: 6 ?Session Start time: 1500 ?Session End time: 4599 ?Total time in minutes: 30 min ? ?Referring Provider: Dr. Dareen Piano, MD ?Patient/Family location: Pt is in private @ work ?[facility] Provider location: [facility] Office ?All persons participating in visit: Pt & Clinician ?Types of Service: Individual psychotherapy ? ?I connected with [name] and/or Cordelia Pen Clayton's  self  via  Telephone or Video Enabled Telemedicine Application  (Video is Caregility application) and verified that I am speaking with the correct person using two identifiers. Discussed confidentiality: Yes  ? ?I discussed the limitations of telemedicine and the availability of in person appointments.  Discussed there is a possibility of technology failure and discussed alternative modes of communication if that failure occurs. ? ?I discussed that engaging in this telemedicine visit, they consent to the provision of behavioral healthcare and the services will be billed under their insurance. ? ?Patient and/or legal guardian expressed understanding and consented to Telemedicine visit: Yes  ? 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 ?Patient and/or Family's Strengths/Protective Factors: ?Social and Emotional competence, Concrete supports in place (healthy food, safe environments, etc.), Sense of purpose, and Physical Health (exercise, healthy diet, medication compliance, etc.) ? ?Goals Addressed: ?Patient will: ? Reduce symptoms of: anxiety, depression, and stress  ? Increase knowledge and/or ability of: coping skills, healthy habits, and stress reduction  ? Demonstrate ability to: Increase healthy adjustment to current life  circumstances ? ?Progress towards Goals: ?Ongoing ? ?Interventions: ?Interventions utilized:  Supportive Counseling ?Standardized Assessments completed:  screeners prn ? ?Patient and/or Family Response: Pt is receptive to call today & requests future visits ? ?Assessment: ?Patient currently experiencing elevated anx/dep due to work stressors.  ? ?Patient may benefit from cont'd Cslg. ? ?Plan: ?Follow up with behavioral health clinician on : 2-3 wks on telehealth for 30 min ?Behavioral recommendations: Cont to work on absence of younger Son who is staying w/his Copy in [place]. ?Referral(s): [place] (In Clinic) ? ?I discussed the assessment and treatment plan with the patient and/or parent/guardian. They were provided an opportunity to ask questions and all were answered. They agreed with the plan and demonstrated an understanding of the instructions. ?  ?They were advised to call back or seek an in-person evaluation if the symptoms worsen or if the condition fails to improve as anticipated. ? ?Donnetta Hutching, LMFT ?

## 2022-02-06 NOTE — Progress Notes (Signed)
Labs are all normal.

## 2022-02-07 NOTE — Progress Notes (Signed)
Called Heather [name] to inform her about her lab results. Heather [name] understood

## 2022-03-09 ENCOUNTER — Other Ambulatory Visit: Payer: Self-pay | Admitting: Internal Medicine

## 2022-03-09 ENCOUNTER — Ambulatory Visit (HOSPITAL_COMMUNITY)
Admission: RE | Admit: 2022-03-09 | Discharge: 2022-03-09 | Disposition: A | Discharge status: Home / Self Care | Payer: 59 | Source: Ambulatory Visit | Attending: Internal Medicine | Admitting: Internal Medicine

## 2022-03-09 ENCOUNTER — Other Ambulatory Visit (HOSPITAL_COMMUNITY): Payer: Self-pay

## 2022-03-09 ENCOUNTER — Encounter (HOSPITAL_COMMUNITY): Payer: Self-pay

## 2022-03-09 VITALS — BP 116/84 | HR 79 | Temp 97.7°F | Resp 18

## 2022-03-09 DIAGNOSIS — R042 Hemoptysis: Secondary | ICD-10-CM

## 2022-03-09 DIAGNOSIS — R1031 Right lower quadrant pain: Secondary | ICD-10-CM

## 2022-03-09 MED ORDER — ALBUTEROL SULFATE HFA 108 (90 BASE) MCG/ACT IN AERS
2.0000 | INHALATION_SPRAY | Freq: Four times a day (QID) | RESPIRATORY_TRACT | 2 refills | Status: DC | PRN
  Filled 2022-03-09: qty 8.5, 25d supply, fill #0

## 2022-03-09 NOTE — Discharge Instructions (Addendum)
Sent to ED via POV. Patient states she will not go. declines avs

## 2022-03-09 NOTE — ED Provider Notes (Signed)
[name]    CSN: [id number] Arrival date & time: 03/09/22  1404      History   Chief Complaint Chief Complaint  Patient presents with   Abdominal Pain    I have had pain around my belly button for a few days with nausea and running to bathroom but now it is also hurting on my mid and lower right abdomen - Entered by patient   Emesis   Nausea    HPI Heather [name] is a 51 y.o. female presenting with severe abdominal pain, associated with nausea vomiting and diarrhea.  Symptoms for approximately 1 week.  The pain initially started around the bellybutton, that has moved to the right lower quadrant and epigastric regions.  Nausea persists throughout the day, but denies any vomiting today.  2-3 episodes of loose stool daily.  Denies melena, hematochezia, hematemesis.  Tolerating fluids and bland food.  She still has her appendix and gallbladder.  Denies recent travel, eating out, antibiotics.  HPI  Past Medical History:  Diagnosis Date   Blood dyscrasia    COPD (chronic obstructive pulmonary disease) ([place])    Current smoker    Difficult intubation    pt was told difficult int[facility] at 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   Headache    "weekly" (09/04/2017)   Heart murmur    Hypokalemia 10/15/2017   Lung adenocarcinoma dx'd 08/2017   s/p RU lobectomy   Pleuritic chest pain 06/13/2018   Pre-diabetes    Pulmonary nodule 07/16/2017   Rapid resting heart rate    happens infrequently   Rib pain on right side 12/21/2017    Patient Active Problem List   Diagnosis Date Noted   Viral upper respiratory tract infection with cough 10/14/2021   Family history of abdominal aortic aneurysm (AAA) 07/13/2021   Claudication in peripheral vascular disease ([place]) 07/13/2021   Emphysema/COPD ([place]) 04/14/2021   Cough with hemoptysis 07/21/2020   Otitis externa 05/18/2020   Insomnia 02/24/2020   Depression 02/24/2020   Visual aura 12/23/2019    Precordial pain 06/13/2018   S/P lobectomy of lung 08/20/2017   Cervical adenopathy 07/04/2017   Factor V Leiden ([place]) 04/13/2015   Prediabetes 04/13/2015   History of colon polyps 04/13/2015   Nonintractable headache 11/24/2014   H/O drug rash due to contrast  05/14/2014   Early satiety 04/28/2014   Loss of weight 04/02/2014   Cough 04/02/2014   Current smoker 04/02/2014   Routine health maintenance 04/02/2014   History of anemia 05/08/2007    Past Surgical History:  Procedure Laterality Date   BIOPSY  04/25/2021   Procedure: BIOPSY;  Surgeon: Doran Stabler, MD;  Location: [facility];  Service: Gastroenterology;;  [place] and [place]; 2012   COLONOSCOPY N/A 05/15/2014   Procedure: COLONOSCOPY;  Surgeon: Lafayette Dragon, MD;  Location: [facility];  Service: Endoscopy;  Laterality: N/A;   COLONOSCOPY WITH PROPOFOL N/A 04/25/2021   Procedure: COLONOSCOPY WITH PROPOFOL;  Surgeon: Doran Stabler, MD;  Location: [facility];  Service: Gastroenterology;  Laterality: N/A;   ESOPHAGOGASTRODUODENOSCOPY N/A 05/15/2014   Procedure: ESOPHAGOGASTRODUODENOSCOPY (EGD);  Surgeon: Lafayette Dragon, MD;  Location: [facility];  Service: Endoscopy;  Laterality: N/A;   ESOPHAGOGASTRODUODENOSCOPY (EGD) WITH PROPOFOL N/A 04/25/2021   Procedure: ESOPHAGOGASTRODUODENOSCOPY (EGD) WITH PROPOFOL;  Surgeon: Doran Stabler, MD;  Location: [facility];  Service: Gastroenterology;  Laterality: N/A;   LAPAROSCOPY  07/07/2011   Procedure: LAPAROSCOPY DIAGNOSTIC;  Surgeon: Elveria Royals;  Location: [facility];  Service: Gynecology;  Laterality: N/A;   LESION REMOVAL  07/07/2011   Procedure: EXCISION VAGINAL LESION;  Surgeon: Elveria Royals;  Location: [facility];  Service: Gynecology;  Laterality: N/A;   LOBECTOMY Right 08/20/2017   Procedure: RIGHT UPPER LOBE LOBECTOMY;  Surgeon: [name], MD;  Location: [place];  Service: Thoracic;  Laterality: Right;   LYMPH NODE DISSECTION N/A  08/20/2017   Procedure: LYMPH NODE DISSECTION;  Surgeon: [name], MD;  Location: [place];  Service: Thoracic;  Laterality: N/A;   POLYPECTOMY  04/25/2021   Procedure: POLYPECTOMY;  Surgeon: Doran Stabler, MD;  Location: [facility];  Service: Gastroenterology;;   VAGINAL HYSTERECTOMY  ~ 2014   VIDEO ASSISTED THORACOSCOPY (VATS)/WEDGE RESECTION Right 08/20/2017   Procedure: RIGHT VIDEO ASSISTED THORACOSCOPY (VATS)/WEDGE RESECTION;  Surgeon: [name], MD;  Location: [place];  Service: Thoracic;  Laterality: Right;    OB History   No obstetric history on file.      Home Medications    Prior to Admission medications   Medication Sig Start Date End Date Taking? Authorizing Provider  albuterol (PROVENTIL HFA) 108 (90 Base) MCG/ACT inhaler Inhale 2 puffs into the lungs every 6 (six) hours as needed for wheezing or shortness of breath. 03/09/22   Aldine Contes, MD  nitroGLYCERIN (NITROSTAT) 0.4 MG SL tablet Place 1 tablet (0.4 mg total) under the tongue every 5 (five) minutes as needed for chest pain. 05/12/21   Patwardhan, Reynold Bowen, MD  ondansetron (ZOFRAN) 4 MG tablet Take 1 tablet (4 mg total) by mouth every 8 (eight) hours as needed for nausea or vomiting. 02/17/21   Danis, Kirke Corin, MD  predniSONE (DELTASONE) 50 MG tablet Take 1 tablet (50 mg total) by mouth 13 hours, 7 hours & 1 hour prior to CT scan. 02/03/22   Cantwell, Celeste C, PA-C  tiotropium (SPIRIVA HANDIHALER) 18 MCG inhalation capsule Place 1 capsule (18 mcg total) into inhaler and inhale daily. 04/14/21   Dorethea Clan, DO    Family History Family History  Problem Relation Age of Onset   Heart disease Mother    Heart disease Father    Colon polyps Father    Breast cancer Sister 34   Colon cancer Paternal Uncle    Colon polyps Paternal Uncle    Stomach cancer Maternal Grandmother     Social History Social History   Tobacco Use   Smoking status: Some Days    Packs/day: 0.75    Years:  34.00    Total pack years: 25.50    Types: Cigarettes   Smokeless tobacco: Never   Tobacco comments:    1-3 cigarette per week currently  Vaping Use   Vaping Use: Never used  Substance Use Topics   Alcohol use: Not Currently    Alcohol/week: 0.0 standard drinks of alcohol    Comment: 09/04/2017 "a mixed drink a few times/year"   Drug use: No     Allergies   Iohexol and Aspirin   Review of Systems Review of Systems  Gastrointestinal:  Positive for abdominal pain.  All other systems reviewed and are negative.    Physical Exam Triage Vital Signs ED Triage Vitals  Enc Vitals Group     BP      Pulse      Resp      Temp      Temp src  SpO2      Weight      Height      Head Circumference      Peak Flow      Pain Score      Pain Loc      Pain Edu?      Excl. in [place]?    No data found.  Updated Vital Signs BP 116/84   Pulse 79   Temp 97.7 F (36.5 C)   Resp 18   SpO2 98%   Visual Acuity Right Eye Distance:   Left Eye Distance:   Bilateral Distance:    Right Eye Near:   Left Eye Near:    Bilateral Near:     Physical Exam Vitals reviewed.  Constitutional:      General: She is not in acute distress.    Appearance: Normal appearance. She is not ill-appearing.  HENT:     Head: Normocephalic and atraumatic.     Mouth/Throat:     Mouth: Mucous membranes are moist.     Comments: Moist mucous membranes Eyes:     Extraocular Movements: Extraocular movements intact.     Pupils: Pupils are equal, round, and reactive to light.  Cardiovascular:     Rate and Rhythm: Normal rate and regular rhythm.     Heart sounds: Normal heart sounds.  Pulmonary:     Effort: Pulmonary effort is normal.     Breath sounds: Normal breath sounds. No wheezing, rhonchi or rales.  Abdominal:     General: Bowel sounds are normal. There is no distension.     Palpations: Abdomen is soft. There is no mass.     Tenderness: There is abdominal tenderness in the right lower quadrant  and epigastric area. There is no right CVA tenderness, left CVA tenderness, guarding or rebound. Negative signs include Murphy's sign, Rovsing's sign and McBurney's sign.  Skin:    General: Skin is warm.     Capillary Refill: Capillary refill takes less than 2 seconds.     Comments: Good skin turgor  Neurological:     General: No focal deficit present.     Mental Status: She is alert and oriented to person, place, and time.  Psychiatric:        Mood and Affect: Mood normal.        Behavior: Behavior normal.      UC Treatments / Results  Labs (all labs ordered are listed, but only abnormal results are displayed) Labs Reviewed - No data to display  EKG   Radiology No results found.  Procedures Procedures (including critical care time)  Medications Ordered in UC Medications - No data to display  Initial Impression / Assessment and Plan / UC Course  I have reviewed the triage vital signs and the nursing notes.  Pertinent labs & imaging results that were available during my care of the patient were reviewed by me and considered in my medical decision making (see chart for details).     This patient is a very pleasant 51 y.o. year old female presenting with severe RLQ and epigastric pain x1 week, associated with n/v/d. Afebrile, nontachy. Tolerating fluids and some foods. Still has her appendix and gallbladder.  Strongly advised that she present to the emergency department for further work-up including likely imaging.  She verbalizes that she will not go to the hospital, though she understands she may have an acute abdomen..   Final Clinical Impressions(s) / UC Diagnoses   Final diagnoses:  Abdominal pain,  RLQ     Discharge Instructions      Sent to ED via POV. Patient states she will not go. declines avs      ED Prescriptions   None    PDMP not reviewed this encounter.   Hazel Sams, PA-C 03/09/22 1432

## 2022-03-09 NOTE — ED Triage Notes (Signed)
Pt is present today with c/o right abdominal pain, vomiting, and nausea. Pt sx started one week ago.

## 2022-03-16 NOTE — Progress Notes (Deleted)
Patient referred by Aldine Contes, MD for chest pain  Subjective:   Heather [name], female    DOB: [birth year], 50 y.o.   MRN: [id number]   No chief complaint on file.    HPI  51 y.o. African American female with h/o stage Ia adenocarcinoma of lung right upper lobectomy (08/2017), anemia, chronic weight loss.   Patient was previously evaluated by Dr. Virgina Jock 07/13/2021 for chest pain.  At that time echocardiogram showed structurally normal heart, mild tricuspid regurgitation.  Abdominal aorta duplex showed mild plaque in the mid to distal aorta with mild ectasia in proximal and distal aorta measuring approximately 2.4 cm.  Lower extremity arterial duplex showed multiphasic waveforms at both ankles, suggestive of no significant PAD.  Stress test was overall low risk.    Patient then presented to our office again 02/03/2022 with complaints of chest pain.  Patient symptoms were consistent with costochondritis, however  Patient now presents today with complaints of worsening chest pain over the last several months.  Reports pain is sternal and constant, worse with certain movements and some exertion.  She describes the pain as pleuritic.  Denies palpitations, syncope, near syncope.  She states she has had episodes of shortness of breath as well.    She has significantly reduced smoking, now smoking 2 to 3 cigarettes/week.  Current Outpatient Medications on File Prior to Visit  Medication Sig Dispense Refill   albuterol (PROVENTIL HFA) 108 (90 Base) MCG/ACT inhaler Inhale 2 puffs into the lungs every 6 (six) hours as needed for wheezing or shortness of breath. 8.5 g 2   nitroGLYCERIN (NITROSTAT) 0.4 MG SL tablet Place 1 tablet (0.4 mg total) under the tongue every 5 (five) minutes as needed for chest pain. 25 tablet 1   ondansetron (ZOFRAN) 4 MG tablet Take 1 tablet (4 mg total) by mouth every 8 (eight) hours as needed for nausea or vomiting. 30 tablet 1   predniSONE (DELTASONE) 50  MG tablet Take 1 tablet (50 mg total) by mouth 13 hours, 7 hours & 1 hour prior to CT scan. 3 tablet 0   tiotropium (SPIRIVA HANDIHALER) 18 MCG inhalation capsule Place 1 capsule (18 mcg total) into inhaler and inhale daily. 90 capsule 0   No current facility-administered medications on file prior to visit.    Cardiovascular and other pertinent studies: EKG 02/03/2022:  Sinus rhythm at a rate of 86 bpm.  Normal axis.  Nonspecific T wave abnormality.  No evidence of ischemia or underlying injury pattern.  PCV MYOCARDIAL PERFUSION WO LEXISCAN 07/25/2021   The study is normal. The study is low risk.   LV perfusion is normal.   Left ventricular function is normal. The left ventricular ejection fraction is normal (55-65%). End diastolic cavity size is normal.   Prior study not available for comparison.  Lexiscan (with Mod Bruce protocol) Nuclear stress test 07/25/2021: Nondiagnostic ECG stress due to pharmacologic stress. Stress symptoms included non limiting chest pain and dyspnea. Additionally, patient exercised on a Modified Bruce protocol achieving 2.27 METS. Overall LV systolic function is normal without regional wall motion abnormalities. Myocardial perfusion is normal. Stress LV EF: 67%. No previous exam available for comparison. Low risk study.  Abdominal Aortic Duplex 05/26/2021:  Mild plaque noted in the mid and distal aorta. There is ectasia noted in  the proximal and distal aorta measuring approximately 2.4 cm. Recommend re  evaluation in 5 years for stability.  Normal iliac artery velocity.   Lower Extremity Arterial Duplex 05/26/2021:  No hemodynamically significant stenoses are identified in the right lower  extremity arterial system. No hemodynamically significant stenoses are  identified in the left lower extremity arterial system.  This exam reveals normal perfusion of the right and left  lower extremity  (ABI 1.00) with multiphasic waveforms at both ankles. Evaluate for  pseudo  claudication.  Echocardiogram 05/20/2021:  Left ventricle cavity is normal in size and wall thickness. Normal global  wall motion. Normal LV systolic function with EF 67%. Normal diastolic  filling pattern.  Mild tricuspid regurgitation.  No evidence of pulmonary hypertension.  EKG 05/12/2021: Sinus rhythm 81 bpm  RSR(V1) -nondiagnostic Borderline right atrial enlargement   Recent labs:    Latest Ref Rng & Units 02/03/2022    3:07 PM 02/15/2021   10:01 AM 07/22/2020    9:53 AM  CMP  Glucose 70 - 99 mg/dL 95  86    BUN 6 - 24 mg/dL 8  11    Creatinine 0.57 - 1.00 mg/dL 0.88  0.70  0.70   Sodium 134 - 144 mmol/L 139  140    Potassium 3.5 - 5.2 mmol/L 4.3  4.6    Chloride 96 - 106 mmol/L 101  101    CO2 20 - 29 mmol/L 22  20    Calcium 8.7 - 10.2 mg/dL 9.7  9.4    Total Protein 6.0 - 8.5 g/dL  7.2    Total Bilirubin 0.0 - 1.2 mg/dL  0.3    Alkaline Phos 44 - 121 IU/L  65    AST 0 - 40 IU/L  17    ALT 0 - 32 IU/L  8        Latest Ref Rng & Units 02/15/2021   10:01 AM 03/25/2020   12:13 PM 03/25/2020    9:28 AM  CBC  WBC 3.4 - 10.8 x10E3/uL 7.0  7.1  7.9   Hemoglobin 11.1 - 15.9 g/dL 13.5  13.8  14.7   Hematocrit 34.0 - 46.6 % 39.7  41.5  44.5   Platelets 150 - 450 x10E3/uL 125  133  147    Lipid Panel     Component Value Date/Time   CHOL 173 02/15/2021 1001   TRIG 52 02/15/2021 1001   HDL 71 02/15/2021 1001   CHOLHDL 2.4 02/15/2021 1001   CHOLHDL 2.7 04/02/2014 0932   VLDL 10 04/02/2014 0932   LDLCALC 92 02/15/2021 1001   HEMOGLOBIN A1C Lab Results  Component Value Date   HGBA1C 5.5 09/03/2017   MPG 111.15 09/03/2017   TSH No results for input(s): "TSH" in the last 8760 hours.  02/15/2021: Glucose 86, BUN/Cr 11/70. EGFR 105. Na/K 140/4.6. Rest of the CMP normal H/H 13/39. MCV 93. Platelets 125 HbA1C N/A Chol 173, TG 52, HDL 71, LDL 92 TSH 1.6 normal    Review of Systems  Cardiovascular:  Positive for chest pain and dyspnea on exertion. Negative  for leg swelling, palpitations and syncope.         There were no vitals filed for this visit.  No data found.    There is no height or weight on file to calculate BMI. There were no vitals filed for this visit.    Objective:   Physical Exam Vitals reviewed.  Constitutional:      General: She is not in acute distress.    Appearance: She is well-developed.  Neck:     Vascular: No JVD.  Cardiovascular:     Rate and Rhythm: Normal rate and  regular rhythm.     Pulses: Intact distal pulses.          Dorsalis pedis pulses are 1+ on the right side and 1+ on the left side.       Posterior tibial pulses are 1+ on the right side and 0 on the left side.     Heart sounds: Normal heart sounds. No murmur heard. Pulmonary:     Effort: Pulmonary effort is normal.     Breath sounds: Normal breath sounds. No wheezing or rales.  Chest:    Musculoskeletal:     Right lower leg: No edema.     Left lower leg: No edema.  Lymphadenopathy:     Cervical: No cervical adenopathy.  Neurological:     Mental Status: She is alert.          Assessment & Recommendations:   51 y.o. African American female with h/o stage Ia adenocarcinoma of lung right upper lobectomy (08/2017), former heavy smoker, anemia, chronic weight loss, strong family h/o CAD and AAA, referred for chest pain  Chest pain: EKG is without acute ischemic changes.  Stress testing approximately 6 months ago was overall low risk.  Again reviewed and discussed results of previous cardiac testing.  My suspicion is low that chest pain is cardiac, however it does have both typical and atypical characteristics.  We will therefore obtain stat troponin, will notify patient immediately if this is abnormal.  We will obtain previously ordered coronary calcium score to further risk stratify patient.   Patient's physical exam is consistent with musculoskeletal chest pain, costochondritis.  Advised patient regarding conservative measures and  advised her to follow-up with PCP if pain does not improve with the use interventions.  Abdominal aorta ectasia Minimal ectasia seen.  Repeat ultrasound in 5 years.  Further recommendations pending results of cardiac testing.   Alethia Berthold, PA-C 03/16/2022, 10:08 AM Office: [phone]

## 2022-03-17 ENCOUNTER — Ambulatory Visit: Payer: 59 | Admitting: Student

## 2022-03-23 ENCOUNTER — Other Ambulatory Visit: Payer: 59

## 2022-05-03 ENCOUNTER — Other Ambulatory Visit: Payer: Self-pay | Admitting: Internal Medicine

## 2022-05-03 ENCOUNTER — Other Ambulatory Visit (HOSPITAL_COMMUNITY): Payer: Self-pay

## 2022-05-04 ENCOUNTER — Other Ambulatory Visit (HOSPITAL_COMMUNITY): Payer: Self-pay

## 2022-05-04 MED ORDER — SPIRIVA HANDIHALER 18 MCG IN CAPS
18.0000 ug | ORAL_CAPSULE | Freq: Every day | RESPIRATORY_TRACT | 0 refills | Status: DC
  Filled 2022-05-04: qty 30, 30d supply, fill #0

## 2022-07-25 ENCOUNTER — Telehealth: Payer: Self-pay | Admitting: Medical Oncology

## 2022-07-25 ENCOUNTER — Other Ambulatory Visit: Payer: Self-pay | Admitting: Medical Oncology

## 2022-07-25 DIAGNOSIS — C3490 Malignant neoplasm of unspecified part of unspecified bronchus or lung: Secondary | ICD-10-CM

## 2022-07-25 NOTE — Telephone Encounter (Signed)
Pt requesting appt -she has not been here since 2020 with [facility].

## 2022-08-14 ENCOUNTER — Ambulatory Visit (HOSPITAL_COMMUNITY)
Admission: RE | Admit: 2022-08-14 | Discharge: 2022-08-14 | Disposition: A | Discharge status: Home / Self Care | Payer: 59 | Source: Ambulatory Visit | Attending: Internal Medicine | Admitting: Internal Medicine

## 2022-08-14 ENCOUNTER — Other Ambulatory Visit: Payer: Self-pay

## 2022-08-14 ENCOUNTER — Inpatient Hospital Stay: Payer: 59 | Attending: Internal Medicine

## 2022-08-14 DIAGNOSIS — C3490 Malignant neoplasm of unspecified part of unspecified bronchus or lung: Secondary | ICD-10-CM | POA: Diagnosis not present

## 2022-08-14 DIAGNOSIS — R059 Cough, unspecified: Secondary | ICD-10-CM | POA: Insufficient documentation

## 2022-08-14 DIAGNOSIS — Z85118 Personal history of other malignant neoplasm of bronchus and lung: Secondary | ICD-10-CM | POA: Insufficient documentation

## 2022-08-14 DIAGNOSIS — Z902 Acquired absence of lung [part of]: Secondary | ICD-10-CM | POA: Insufficient documentation

## 2022-08-14 DIAGNOSIS — J439 Emphysema, unspecified: Secondary | ICD-10-CM | POA: Diagnosis not present

## 2022-08-14 DIAGNOSIS — I700 Atherosclerosis of aorta: Secondary | ICD-10-CM | POA: Diagnosis not present

## 2022-08-14 LAB — CMP (CANCER CENTER ONLY)
ALT: 8 U/L (ref 0–44)
AST: 9 U/L — ABNORMAL LOW (ref 15–41)
Albumin: 4.8 g/dL (ref 3.5–5.0)
Alkaline Phosphatase: 64 U/L (ref 38–126)
Anion gap: 7 (ref 5–15)
BUN: 15 mg/dL (ref 6–20)
CO2: 29 mmol/L (ref 22–32)
Calcium: 9.7 mg/dL (ref 8.9–10.3)
Chloride: 106 mmol/L (ref 98–111)
Creatinine: 0.99 mg/dL (ref 0.44–1.00)
GFR, Estimated: >60 mL/min (ref >60)
Glucose, Bld: 106 mg/dL — ABNORMAL HIGH (ref 70–99)
Potassium: 4 mmol/L (ref 3.5–5.1)
Sodium: 142 mmol/L (ref 135–145)
Total Bilirubin: 0.3 mg/dL (ref 0.3–1.2)
Total Protein: 7.3 g/dL (ref 6.5–8.1)

## 2022-08-14 LAB — CBC WITH DIFFERENTIAL (CANCER CENTER ONLY)
Abs Immature Granulocytes: 0.01 10*3/uL (ref 0.00–0.07)
Basophils Absolute: 0.1 10*3/uL (ref 0.0–0.1)
Basophils Relative: 1 %
Eosinophils Absolute: 0.1 10*3/uL (ref 0.0–0.5)
Eosinophils Relative: 1 %
HCT: 39.5 % (ref 36.0–46.0)
Hemoglobin: 13 g/dL (ref 12.0–15.0)
Immature Granulocytes: 0 %
Lymphocytes Relative: 21 %
Lymphs Abs: 1.5 10*3/uL (ref 0.7–4.0)
MCH: 31.6 pg (ref 26.0–34.0)
MCHC: 32.9 g/dL (ref 30.0–36.0)
MCV: 95.9 fL (ref 80.0–100.0)
Monocytes Absolute: 0.5 10*3/uL (ref 0.1–1.0)
Monocytes Relative: 7 %
Neutro Abs: 4.9 10*3/uL (ref 1.7–7.7)
Neutrophils Relative %: 70 %
Platelet Count: 120 10*3/uL — ABNORMAL LOW (ref 150–400)
RBC: 4.12 MIL/uL (ref 3.87–5.11)
RDW: 13.6 % (ref 11.5–15.5)
WBC Count: 6.9 10*3/uL (ref 4.0–10.5)
nRBC: 0 % (ref 0.0–0.2)

## 2022-08-16 ENCOUNTER — Inpatient Hospital Stay (HOSPITAL_BASED_OUTPATIENT_CLINIC_OR_DEPARTMENT_OTHER): Payer: 59 | Admitting: Internal Medicine

## 2022-08-16 ENCOUNTER — Other Ambulatory Visit: Payer: Self-pay

## 2022-08-16 ENCOUNTER — Encounter: Payer: Self-pay | Admitting: Internal Medicine

## 2022-08-16 VITALS — BP 111/66 | HR 61 | Temp 97.7°F | Resp 16 | Wt 112.2 lb

## 2022-08-16 DIAGNOSIS — C3490 Malignant neoplasm of unspecified part of unspecified bronchus or lung: Secondary | ICD-10-CM

## 2022-08-16 DIAGNOSIS — J439 Emphysema, unspecified: Secondary | ICD-10-CM | POA: Diagnosis not present

## 2022-08-16 DIAGNOSIS — Z902 Acquired absence of lung [part of]: Secondary | ICD-10-CM | POA: Diagnosis not present

## 2022-08-16 DIAGNOSIS — Z85118 Personal history of other malignant neoplasm of bronchus and lung: Secondary | ICD-10-CM | POA: Diagnosis not present

## 2022-08-16 DIAGNOSIS — R059 Cough, unspecified: Secondary | ICD-10-CM | POA: Diagnosis not present

## 2022-08-16 NOTE — Patient Instructions (Signed)
Steps to Quit Smoking Smoking tobacco is the leading cause of preventable death. It can affect almost every organ in the body. Smoking puts you and people around you at risk for many serious, long-lasting (chronic) diseases. Quitting smoking can be hard, but it is one of the best things that you can do for your health. It is never too late to quit. Do not give up if you cannot quit the first time. Some people need to try many times to quit. Do your best to stick to your quit plan, and talk with your doctor if you have any questions or concerns. How do I get ready to quit? Pick a date to quit. Set a date within the next 2 weeks to give you time to prepare. Write down the reasons why you are quitting. Keep this list in places where you will see it often. Tell your family, friends, and co-workers that you are quitting. Their support is important. Talk with your doctor about the choices that may help you quit. Find out if your health insurance will pay for these treatments. Know the people, places, things, and activities that make you want to smoke (triggers). Avoid them. What first steps can I take to quit smoking? Throw away all cigarettes at home, at work, and in your car. Throw away the things that you use when you smoke, such as ashtrays and lighters. Clean your car. Empty the ashtray. Clean your home, including curtains and carpets. What can I do to help me quit smoking? Talk with your doctor about taking medicines and seeing a counselor. You are more likely to succeed when you do both. If you are pregnant or breastfeeding: Talk with your doctor about counseling or other ways to quit smoking. Do not take medicine to help you quit smoking unless your doctor tells you to. Quit right away Quit smoking completely, instead of slowly cutting back on how much you smoke over a period of time. Stopping smoking right away may be more successful than slowly quitting. Go to counseling. In-person is best  if this is an option. You are more likely to quit if you go to counseling sessions regularly. Take medicine You may take medicines to help you quit. Some medicines need a prescription, and some you can buy over-the-counter. Some medicines may contain a drug called nicotine to replace the nicotine in cigarettes. Medicines may: Help you stop having the desire to smoke (cravings). Help to stop the problems that come when you stop smoking (withdrawal symptoms). Your doctor may ask you to use: Nicotine patches, gum, or lozenges. Nicotine inhalers or sprays. Non-nicotine medicine that you take by mouth. Find resources Find resources and other ways to help you quit smoking and remain smoke-free after you quit. They include: Online chats with a counselor. Phone quitlines. Printed self-help materials. Support groups or group counseling. Text messaging programs. Mobile phone apps. Use apps on your mobile phone or tablet that can help you stick to your quit plan. Examples of free services include Quit Guide from the CDC and smokefree.gov  What can I do to make it easier to quit?  Talk to your family and friends. Ask them to support and encourage you. Call a phone quitline, such as 1-800-QUIT-NOW, reach out to support groups, or work with a counselor. Ask people who smoke to not smoke around you. Avoid places that make you want to smoke, such as: Bars. Parties. Smoke-break areas at work. Spend time with people who do not smoke. Lower   the stress in your life. Stress can make you want to smoke. Try these things to lower stress: Getting regular exercise. Doing deep-breathing exercises. Doing yoga. Meditating. What benefits will I see if I quit smoking? Over time, you may have: A better sense of smell and taste. Less coughing and sore throat. A slower heart rate. Lower blood pressure. Clearer skin. Better breathing. Fewer sick days. Summary Quitting smoking can be hard, but it is one of  the best things that you can do for your health. Do not give up if you cannot quit the first time. Some people need to try many times to quit. When you decide to quit smoking, make a plan to help you succeed. Quit smoking right away, not slowly over a period of time. When you start quitting, get help and support to keep you smoke-free. This information is not intended to replace advice given to you by your health care provider. Make sure you discuss any questions you have with your health care provider. Document Revised: 09/09/2021 Document Reviewed: 09/09/2021 Elsevier Patient Education  2023 Elsevier Inc.  

## 2022-08-16 NOTE — Progress Notes (Signed)
[place] Telephone:([phone]) [phone]   Fax:([phone]  OFFICE PROGRESS NOTE  Aldine Contes, MD [address], [address] [place] [address]  DIAGNOSIS: Stage IA (T1a, N0, M0) moderately differentiated adenocarcinoma diagnosed in November 2018.  PRIOR THERAPY: Status post right upper lobectomy with mediastinal lymph node dissection under the care of Dr. Roxan Hockey on August 20, 2017.  CURRENT THERAPY: Observation.  INTERVAL HISTORY: Heather [name] 51 y.o. female returns to the clinic today for follow-up visit.  The patient was lost to follow-up for several years.  She denied having any current chest pain but has more cough with no shortness of breath or hemoptysis.  She has no nausea, vomiting, diarrhea or constipation.  She has no headache or visual changes.  She has no significant weight loss or night sweats.  She is here today for evaluation with repeat CT scan of the chest for restaging of her disease.  MEDICAL HISTORY: Past Medical History:  Diagnosis Date   Blood dyscrasia    COPD (chronic obstructive pulmonary disease) ([place])    Current smoker    Difficult intubation    pt was told difficult intubation at 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  MEDICATIONS:  Current Outpatient Medications  Medication Sig Dispense Refill   albuterol (PROVENTIL HFA) 108 (90 Base) MCG/ACT inhaler Inhale 2 puffs into the lungs every 6 (six) hours as needed for wheezing or shortness of breath. 8.5 g 2   nitroGLYCERIN (NITROSTAT) 0.4 MG SL tablet Place 1 tablet (0.4 mg total) under  the tongue every 5 (five) minutes as needed for chest pain. 25 tablet 1   ondansetron (ZOFRAN) 4 MG tablet Take 1 tablet (4 mg total) by mouth every 8 (eight) hours as needed for nausea or vomiting. 30 tablet 1   predniSONE (DELTASONE) 50 MG tablet Take 1 tablet (50 mg total) by mouth 13 hours, 7 hours & 1 hour prior to CT scan. 3 tablet 0   tiotropium (SPIRIVA HANDIHALER) 18 MCG inhalation capsule Place 1 capsule (18 mcg total) into inhaler and inhale daily. 90 capsule 0   No current facility-administered medications for this visit.    SURGICAL HISTORY:  Past Surgical History:  Procedure Laterality Date   BIOPSY  04/25/2021   Procedure: BIOPSY;  Surgeon: Doran Stabler, MD;  Location: [facility];  Service: Gastroenterology;;  [place]; 2012   COLONOSCOPY N/A 05/15/2014   Procedure: COLONOSCOPY;  Surgeon: Lafayette Dragon, MD;  Location: [facility];  Service: Endoscopy;  Laterality: N/A;   COLONOSCOPY WITH PROPOFOL N/A 04/25/2021   Procedure: COLONOSCOPY WITH PROPOFOL;  Surgeon: Doran Stabler, MD;  Location: [facility];  Service: Gastroenterology;  Laterality: N/A;   ESOPHAGOGASTRODUODENOSCOPY N/A 05/15/2014   Procedure: ESOPHAGOGASTRODUODENOSCOPY (EGD);  Surgeon: Lafayette Dragon, MD;  Location: [facility];  Service: Endoscopy;  Laterality: N/A;   ESOPHAGOGASTRODUODENOSCOPY (EGD) WITH PROPOFOL N/A 04/25/2021   Procedure: ESOPHAGOGASTRODUODENOSCOPY (EGD) WITH PROPOFOL;  Surgeon: Nelida Meuse  III, MD;  Location: [facility];  Service: Gastroenterology;  Laterality: N/A;   LAPAROSCOPY  07/07/2011   Procedure: LAPAROSCOPY DIAGNOSTIC;  Surgeon: Elveria Royals;  Location: [facility];  Service: Gynecology;  Laterality: N/A;   LESION REMOVAL  07/07/2011   Procedure: EXCISION VAGINAL LESION;  Surgeon: Elveria Royals;  Location: [facility];  Service: Gynecology;  Laterality: N/A;   LOBECTOMY Right 08/20/2017   Procedure: RIGHT UPPER LOBE LOBECTOMY;  Surgeon:  [name], MD;  Location: [place];  Service: Thoracic;  Laterality: Right;   LYMPH NODE DISSECTION N/A 08/20/2017   Procedure: LYMPH NODE DISSECTION;  Surgeon: [name], MD;  Location: [place];  Service: Thoracic;  Laterality: N/A;   POLYPECTOMY  04/25/2021   Procedure: POLYPECTOMY;  Surgeon: Doran Stabler, MD;  Location: [facility];  Service: Gastroenterology;;   VAGINAL HYSTERECTOMY  ~ 2014   VIDEO ASSISTED THORACOSCOPY (VATS)/WEDGE RESECTION Right 08/20/2017   Procedure: RIGHT VIDEO ASSISTED THORACOSCOPY (VATS)/WEDGE RESECTION;  Surgeon: [name], MD;  Location: [place];  Service: Thoracic;  Laterality: Right;    REVIEW OF SYSTEMS:  A comprehensive review of systems was negative except for: Respiratory: positive for cough   PHYSICAL EXAMINATION: General appearance: alert, cooperative, and no distress Head: Normocephalic, without obvious abnormality, atraumatic Neck: no adenopathy, no JVD, supple, symmetrical, trachea midline, and thyroid not enlarged, symmetric, no tenderness/mass/nodules Lymph nodes: Cervical, supraclavicular, and axillary nodes normal. Resp: clear to auscultation bilaterally Back: symmetric, no curvature. ROM normal. No CVA tenderness. Cardio: regular rate and rhythm, S1, S2 normal, no murmur, click, rub or gallop GI: soft, non-tender; bowel sounds normal; no masses,  no organomegaly Extremities: extremities normal, atraumatic, no cyanosis or edema  ECOG PERFORMANCE STATUS: 1 - Symptomatic but completely ambulatory  Blood pressure 111/66, pulse 61, temperature 97.7 F (36.5 C), temperature source Oral, resp. rate 16, weight 112 lb 3.2 oz (50.9 kg), SpO2 100 %.  LABORATORY DATA: Lab Results  Component Value Date   WBC 6.9 08/14/2022   HGB 13.0 08/14/2022   HCT 39.5 08/14/2022   MCV 95.9 08/14/2022   PLT 120 (L) 08/14/2022      Chemistry      Component Value Date/Time   NA 142 08/14/2022 1209   NA 139 02/03/2022 1507    NA 141 07/31/2017 1054   K 4.0 08/14/2022 1209   K 3.6 07/31/2017 1054   CL 106 08/14/2022 1209   CO2 29 08/14/2022 1209   CO2 23 07/31/2017 1054   BUN 15 08/14/2022 1209   BUN 8 02/03/2022 1507   BUN 5.9 (L) 07/31/2017 1054   CREATININE 0.99 08/14/2022 1209   CREATININE 0.8 07/31/2017 1054      Component Value Date/Time   CALCIUM 9.7 08/14/2022 1209   CALCIUM 9.9 07/31/2017 1054   ALKPHOS 64 08/14/2022 1209   ALKPHOS 50 07/31/2017 1054   AST 9 (L) 08/14/2022 1209   AST 12 07/31/2017 1054   ALT 8 08/14/2022 1209   ALT 9 07/31/2017 1054   BILITOT 0.3 08/14/2022 1209   BILITOT 0.69 07/31/2017 1054       RADIOGRAPHIC STUDIES: CT Chest Wo Contrast  Result Date: 08/15/2022 CLINICAL DATA:  Non-small cell lung cancer follow-up in a 51 year old female. w * Tracking Code: BO * EXAM: CT CHEST WITHOUT CONTRAST TECHNIQUE: Multidetector CT imaging of the chest was performed following the standard protocol without IV contrast. RADIATION DOSE REDUCTION: This exam was performed according to the departmental dose-optimization program which includes automated  exposure control, adjustment of the mA and/or kV according to patient size and/or use of iterative reconstruction technique. COMPARISON:  July 22, 2020 FINDINGS: Cardiovascular: Aortic atherosclerosis. Normal heart size without pericardial effusion. Calcified coronary artery disease in LEFT coronary circulation, mild. Mediastinum/Nodes: Distortion of the RIGHT hilum following post treatment changes in the RIGHT chest. No adenopathy in the chest. Lungs/Pleura: Postoperative changes of RIGHT upper lobectomy and pulmonary wedge resection. Stable volume loss along the medial RIGHT chest. No signs of effusion. No consolidative changes. Airways are patent. Signs of pulmonary emphysema and pleural and parenchymal scarring similar to prior imaging. Upper Abdomen: No acute upper abdominal findings to the extent evaluated. No upper abdominal  lymphadenopathy. Adrenal glands are normal. Musculoskeletal: No acute bone finding. No destructive bone process. Spinal degenerative changes. IMPRESSION: 1. Postoperative changes of RIGHT upper lobectomy and pulmonary wedge resection. No evidence of recurrent or metastatic disease. 2. Signs of pulmonary emphysema and pleural and parenchymal scarring similar to prior imaging. 3. Aortic atherosclerosis and coronary artery disease. Aortic Atherosclerosis (ICD10-I70.0) and Emphysema (ICD10-J43.9). Electronically Signed   By: Zetta Bills M.D.   On: 08/15/2022 11:53     ASSESSMENT AND PLAN: This is a very pleasant 51 years old white female recently diagnosed with a stage IA non-small cell lung cancer, adenocarcinoma status post right upper lobectomy with lymph node dissection in November 2018 under the care of Dr. Roxan Hockey. The patient has been on observation since that time and if she is feeling fine except for the persistent dry cough. She had repeat CT scan of the chest performed recently.  I personally and independently reviewed the scan and discussed the result with the patient today. Her scan showed no concerning findings for disease recurrence or metastasis. I recommended for her to continue on observation with repeat CT scan of the chest in 1 year. For the persistent cough and pulmonary emphysema, I will refer the patient to pulmonary medicine for evaluation of her condition. The patient was advised to call immediately if she has any other concerning symptoms in the interval. The patient voices understanding of current disease status and treatment options and is in agreement with the current care plan. All questions were answered. The patient knows to call the clinic with any problems, questions or concerns. We can certainly see the patient much sooner if necessary.  The total time spent in the appointment was 25 minutes.  Disclaimer: This note was dictated with voice recognition software.  Similar sounding words can inadvertently be transcribed and may not be corrected upon review.

## 2022-08-31 ENCOUNTER — Institutional Professional Consult (permissible substitution): Payer: 59 | Admitting: Pulmonary Disease

## 2022-08-31 NOTE — Progress Notes (Deleted)
Synopsis: Referred in November 2023 for emphysema, history of adenocarcinoma status post right upper lobectomy in 2018  Subjective:   PATIENT ID: Heather [name] GENDER: female DOB: [birth year], MRN: [id number]   HPI  No chief complaint on file.   *** Record review: Seen by Dr. Earlie Server in November 2023 after an annual surveillance CT scan showed no evidence of recurrent adenocarcinoma.  Has been monitored with expectant therapy since her lymph node dissection and right upper lobectomy in 2018 performed by Dr. Roxan Hockey.  Noted to have some emphysema on the CT scan, referred to [place] for the same.  Past Medical History:  Diagnosis Date   Blood dyscrasia    COPD (chronic obstructive pulmonary disease) ([place])    Current smoker    Difficult intubation    pt was told difficult intubation at [facility]. 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   Headache    "weekly" (09/04/2017)   Heart murmur    Hypokalemia 10/15/2017   Lung adenocarcinoma dx'd 08/2017   s/p RU lobectomy   Pleuritic chest pain 06/13/2018   Pre-diabetes    Pulmonary nodule 07/16/2017   Rapid resting heart rate    happens infrequently   Rib pain on right side 12/21/2017     Family History  Problem Relation Age of Onset   Heart disease Mother    Heart disease Father    Colon polyps Father    Breast cancer Sister 67   Colon cancer Paternal Uncle    Colon polyps Paternal Uncle    Stomach cancer Maternal Grandmother      Social History   Socioeconomic History   Marital status: Divorced    Spouse name: Not on file   Number of children: 2   Years of education: Not on file   Highest education level: Not on file  Occupational History   Not on file  Tobacco Use   Smoking status: Some Days    Packs/day: 0.75    Years: 34.00    Total pack years: 25.50    Types: Cigarettes   Smokeless tobacco: Never   Tobacco comments:    1-3 cigarette per week currently  Vaping Use   Vaping Use: Never  used  Substance and Sexual Activity   Alcohol use: Not Currently    Alcohol/week: 0.0 standard drinks of alcohol    Comment: 09/04/2017 "a mixed drink a few times/year"   Drug use: No   Sexual activity: Not Currently  Other Topics Concern   Not on file  Social History Narrative   Not on file   Social Determinants of Health   Financial Resource Strain: Medium Risk (09/04/2017)   Overall Financial Resource Strain (CARDIA)    Difficulty of Paying Living Expenses: Somewhat hard  Food Insecurity: No Food Insecurity (09/04/2017)   Hunger Vital Sign    Worried About Running Out of Food in the Last Year: Never true    Ran Out of Food in the Last Year: Never true  Transportation Needs: No Transportation Needs (09/04/2017)   PRAPARE - Hydrologist (Medical): No    Lack of Transportation (Non-Medical): No  Physical Activity: Sufficiently Active (09/04/2017)   Exercise Vital Sign    Days of Exercise per Week: 3 days    Minutes of Exercise per Session: 60 min  Stress: Stress Concern Present (09/04/2017)   Bigfoot    Feeling of Stress :  Very much  Social Connections: Moderately Isolated (09/04/2017)   Social Connection and Isolation Panel [NHANES]    Frequency of Communication with Friends and Family: Once a week    Frequency of Social Gatherings with Friends and Family: More than three times a week    Attends Religious Services: Never    Marine scientist or Organizations: No    Attends Archivist Meetings: Never    Marital Status: Divorced  Human resources officer Violence: Not At Risk (09/04/2017)   Humiliation, Afraid, Rape, and Kick questionnaire    Fear of Current or Ex-Partner: No    Emotionally Abused: No    Physically Abused: No    Sexually Abused: No     Allergies  Allergen Reactions   Iohexol Shortness Of Breath, Swelling and Rash    Patient indicates she had swelling all  over her body as well as shortness of breath.  Even when she was premedicated, she broke out in a full body rash.   Aspirin Nausea And Vomiting     Outpatient Medications Prior to Visit  Medication Sig Dispense Refill   albuterol (PROVENTIL HFA) 108 (90 Base) MCG/ACT inhaler Inhale 2 puffs into the lungs every 6 (six) hours as needed for wheezing or shortness of breath. (Patient not taking: Reported on 08/16/2022) 8.5 g 2   nitroGLYCERIN (NITROSTAT) 0.4 MG SL tablet Place 1 tablet (0.4 mg total) under the tongue every 5 (five) minutes as needed for chest pain. (Patient not taking: Reported on 08/16/2022) 25 tablet 1   ondansetron (ZOFRAN) 4 MG tablet Take 1 tablet (4 mg total) by mouth every 8 (eight) hours as needed for nausea or vomiting. (Patient not taking: Reported on 08/16/2022) 30 tablet 1   predniSONE (DELTASONE) 50 MG tablet Take 1 tablet (50 mg total) by mouth 13 hours, 7 hours & 1 hour prior to CT scan. (Patient not taking: Reported on 08/16/2022) 3 tablet 0   tiotropium (SPIRIVA HANDIHALER) 18 MCG inhalation capsule Place 1 capsule (18 mcg total) into inhaler and inhale daily. (Patient not taking: Reported on 08/16/2022) 90 capsule 0   No facility-administered medications prior to visit.    ROS    Objective:  Physical Exam   There were no vitals filed for this visit.  ***  CBC    Component Value Date/Time   WBC 6.9 08/14/2022 1209   WBC 7.1 03/25/2020 1213   RBC 4.12 08/14/2022 1209   HGB 13.0 08/14/2022 1209   HGB 13.5 02/15/2021 1001   HGB 14.2 07/31/2017 1054   HCT 39.5 08/14/2022 1209   HCT 39.7 02/15/2021 1001   HCT 42.6 07/31/2017 1054   PLT 120 (L) 08/14/2022 1209   PLT 125 (L) 02/15/2021 1001   MCV 95.9 08/14/2022 1209   MCV 93 02/15/2021 1001   MCV 94.6 07/31/2017 1054   MCH 31.6 08/14/2022 1209   MCHC 32.9 08/14/2022 1209   RDW 13.6 08/14/2022 1209   RDW 13.4 02/15/2021 1001   RDW 13.5 07/31/2017 1054   LYMPHSABS 1.5 08/14/2022 1209   LYMPHSABS  1.4 02/15/2021 1001   LYMPHSABS 1.5 07/31/2017 1054   MONOABS 0.5 08/14/2022 1209   MONOABS 0.3 07/31/2017 1054   EOSABS 0.1 08/14/2022 1209   EOSABS 0.1 02/15/2021 1001   EOSABS 0.1 02/01/2011 1404   BASOSABS 0.1 08/14/2022 1209   BASOSABS 0.1 02/15/2021 1001   BASOSABS 0.1 07/31/2017 1054     Chest imaging: November 2023 CT chest images independently reviewed showing postoperative right upper  lobectomy findings, emphysema noted in the apices bilaterally no mass or nodule  PFT:  Labs:  Path:  Echo:  Heart Catheterization:       Assessment & Plan:   No diagnosis found.  Discussion: ***  Immunizations: Immunization History  Administered Date(s) Administered   Influenza,inj,Quad PF,6+ Mos 07/18/2017   Pneumococcal Polysaccharide-23 08/28/2017   Tdap 02/26/2018     Current Outpatient Medications:    albuterol (PROVENTIL HFA) 108 (90 Base) MCG/ACT inhaler, Inhale 2 puffs into the lungs every 6 (six) hours as needed for wheezing or shortness of breath. (Patient not taking: Reported on 08/16/2022), Disp: 8.5 g, Rfl: 2   nitroGLYCERIN (NITROSTAT) 0.4 MG SL tablet, Place 1 tablet (0.4 mg total) under the tongue every 5 (five) minutes as needed for chest pain. (Patient not taking: Reported on 08/16/2022), Disp: 25 tablet, Rfl: 1   ondansetron (ZOFRAN) 4 MG tablet, Take 1 tablet (4 mg total) by mouth every 8 (eight) hours as needed for nausea or vomiting. (Patient not taking: Reported on 08/16/2022), Disp: 30 tablet, Rfl: 1   predniSONE (DELTASONE) 50 MG tablet, Take 1 tablet (50 mg total) by mouth 13 hours, 7 hours & 1 hour prior to CT scan. (Patient not taking: Reported on 08/16/2022), Disp: 3 tablet, Rfl: 0   tiotropium (SPIRIVA HANDIHALER) 18 MCG inhalation capsule, Place 1 capsule (18 mcg total) into inhaler and inhale daily. (Patient not taking: Reported on 08/16/2022), Disp: 90 capsule, Rfl: 0

## 2022-09-19 ENCOUNTER — Institutional Professional Consult (permissible substitution): Payer: 59 | Admitting: Pulmonary Disease

## 2022-09-19 NOTE — Progress Notes (Deleted)
Synopsis: Referred in November 2023 for emphysema, history of adenocarcinoma status post right upper lobectomy in 2018  Subjective:   PATIENT ID: Heather [name]: female DOB: 03-15-1971, MRN: [id number]   HPI  No chief complaint on file.   *** Record review: Seen by Dr. Shirline Frees in November 2023 after an annual surveillance CT scan showed no evidence of recurrent adenocarcinoma.  Has been monitored with expectant therapy since her lymph node dissection and right upper lobectomy in 2018 performed by Dr. Dorris Fetch.  Noted to have some emphysema on the CT scan, referred to [place] for the same.  Past Medical History:  Diagnosis Date   Blood dyscrasia    COPD (chronic obstructive pulmonary disease) ([facility])    Current smoker    Difficult intubation    pt was told difficult intubation at [facility]. 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   Headache    "weekly" (09/04/2017)   Heart murmur    Hypokalemia 10/15/2017   Lung adenocarcinoma dx'd 08/2017   s/p RU lobectomy   Pleuritic chest pain 06/13/2018   Pre-diabetes    Pulmonary nodule 07/16/2017   Rapid resting heart rate    happens infrequently   Rib pain on right side 12/21/2017     Family History  Problem Relation Age of Onset   Heart disease Mother    Heart disease Father    Colon polyps Father    Breast cancer Sister 62   Colon cancer Paternal Uncle    Colon polyps Paternal Uncle    Stomach cancer Maternal Grandmother      Social History   Socioeconomic History   Marital status: Divorced    Spouse name: Not on file   Number of children: 2   Years of education: Not on file   Highest education level: Not on file  Occupational History   Not on file  Tobacco Use   Smoking status: Some Days    Packs/day: 0.75    Years: 34.00    Total pack years: 25.50    Types: Cigarettes   Smokeless tobacco: Never   Tobacco comments:    1-3 cigarette per week currently  Vaping Use   Vaping Use: Never  used  Substance and Sexual Activity   Alcohol use: Not Currently    Alcohol/week: 0.0 standard drinks of alcohol    Comment: 09/04/2017 "a mixed drink a few times/year"   Drug use: No   Sexual activity: Not Currently  Other Topics Concern   Not on file  Social History Narrative   Not on file   Social Determinants of Health   Financial Resource Strain: Medium Risk (09/04/2017)   Overall Financial Resource Strain (CARDIA)    Difficulty of Paying Living Expenses: Somewhat hard  Food Insecurity: No Food Insecurity (09/04/2017)   Hunger Vital Sign    Worried About Running Out of Food in the Last Year: Never true    Ran Out of Food in the Last Year: Never true  Transportation Needs: No Transportation Needs (09/04/2017)   PRAPARE - Administrator, Civil Service (Medical): No    Lack of Transportation (Non-Medical): No  Physical Activity: Sufficiently Active (09/04/2017)   Exercise Vital Sign    Days of Exercise per Week: 3 days    Minutes of Exercise per Session: 60 min  Stress: Stress Concern Present (09/04/2017)   [employer] of Occupational Health - Occupational Stress Questionnaire    Feeling of Stress :  Very much  Social Connections: Moderately Isolated (09/04/2017)   Social Connection and Isolation Panel [NHANES]    Frequency of Communication with Friends and Family: Once a week    Frequency of Social Gatherings with Friends and Family: More than three times a week    Attends Religious Services: Never    Database administrator or Organizations: No    Attends Banker Meetings: Never    Marital Status: Divorced  Catering manager Violence: Not At Risk (09/04/2017)   Humiliation, Afraid, Rape, and Kick questionnaire    Fear of Current or Ex-Partner: No    Emotionally Abused: No    Physically Abused: No    Sexually Abused: No     Allergies  Allergen Reactions   Iohexol Shortness Of Breath, Swelling and Rash    Patient indicates she had swelling all  over her body as well as shortness of breath.  Even when she was premedicated, she broke out in a full body rash.   Aspirin Nausea And Vomiting     Outpatient Medications Prior to Visit  Medication Sig Dispense Refill   albuterol (PROVENTIL HFA) 108 (90 Base) MCG/ACT inhaler Inhale 2 puffs into the lungs every 6 (six) hours as needed for wheezing or shortness of breath. (Patient not taking: Reported on 08/16/2022) 8.5 g 2   nitroGLYCERIN (NITROSTAT) 0.4 MG SL tablet Place 1 tablet (0.4 mg total) under the tongue every 5 (five) minutes as needed for chest pain. (Patient not taking: Reported on 08/16/2022) 25 tablet 1   ondansetron (ZOFRAN) 4 MG tablet Take 1 tablet (4 mg total) by mouth every 8 (eight) hours as needed for nausea or vomiting. (Patient not taking: Reported on 08/16/2022) 30 tablet 1   predniSONE (DELTASONE) 50 MG tablet Take 1 tablet (50 mg total) by mouth 13 hours, 7 hours & 1 hour prior to CT scan. (Patient not taking: Reported on 08/16/2022) 3 tablet 0   tiotropium (SPIRIVA HANDIHALER) 18 MCG inhalation capsule Place 1 capsule (18 mcg total) into inhaler and inhale daily. (Patient not taking: Reported on 08/16/2022) 90 capsule 0   No facility-administered medications prior to visit.    ROS    Objective:  Physical Exam   There were no vitals filed for this visit.  ***  CBC    Component Value Date/Time   WBC 6.9 08/14/2022 1209   WBC 7.1 03/25/2020 1213   RBC 4.12 08/14/2022 1209   HGB 13.0 08/14/2022 1209   HGB 13.5 02/15/2021 1001   HGB 14.2 07/31/2017 1054   HCT 39.5 08/14/2022 1209   HCT 39.7 02/15/2021 1001   HCT 42.6 07/31/2017 1054   PLT 120 (L) 08/14/2022 1209   PLT 125 (L) 02/15/2021 1001   MCV 95.9 08/14/2022 1209   MCV 93 02/15/2021 1001   MCV 94.6 07/31/2017 1054   MCH 31.6 08/14/2022 1209   MCHC 32.9 08/14/2022 1209   RDW 13.6 08/14/2022 1209   RDW 13.4 02/15/2021 1001   RDW 13.5 07/31/2017 1054   LYMPHSABS 1.5 08/14/2022 1209   LYMPHSABS  1.4 02/15/2021 1001   LYMPHSABS 1.5 07/31/2017 1054   MONOABS 0.5 08/14/2022 1209   MONOABS 0.3 07/31/2017 1054   EOSABS 0.1 08/14/2022 1209   EOSABS 0.1 02/15/2021 1001   EOSABS 0.1 02/01/2011 1404   BASOSABS 0.1 08/14/2022 1209   BASOSABS 0.1 02/15/2021 1001   BASOSABS 0.1 07/31/2017 1054     Chest imaging: November 2023 CT chest images independently reviewed showing postoperative right upper  lobectomy findings, emphysema noted in the apices bilaterally no mass or nodule  PFT:  Labs:  Path:  Echo:  Heart Catheterization:       Assessment & Plan:   No diagnosis found.  Discussion: ***  Immunizations: Immunization History  Administered Date(s) Administered   Influenza,inj,Quad PF,6+ Mos 07/18/2017   Pneumococcal Polysaccharide-23 08/28/2017   Tdap 02/26/2018     Current Outpatient Medications:    albuterol (PROVENTIL HFA) 108 (90 Base) MCG/ACT inhaler, Inhale 2 puffs into the lungs every 6 (six) hours as needed for wheezing or shortness of breath. (Patient not taking: Reported on 08/16/2022), Disp: 8.5 g, Rfl: 2   nitroGLYCERIN (NITROSTAT) 0.4 MG SL tablet, Place 1 tablet (0.4 mg total) under the tongue every 5 (five) minutes as needed for chest pain. (Patient not taking: Reported on 08/16/2022), Disp: 25 tablet, Rfl: 1   ondansetron (ZOFRAN) 4 MG tablet, Take 1 tablet (4 mg total) by mouth every 8 (eight) hours as needed for nausea or vomiting. (Patient not taking: Reported on 08/16/2022), Disp: 30 tablet, Rfl: 1   predniSONE (DELTASONE) 50 MG tablet, Take 1 tablet (50 mg total) by mouth 13 hours, 7 hours & 1 hour prior to CT scan. (Patient not taking: Reported on 08/16/2022), Disp: 3 tablet, Rfl: 0   tiotropium (SPIRIVA HANDIHALER) 18 MCG inhalation capsule, Place 1 capsule (18 mcg total) into inhaler and inhale daily. (Patient not taking: Reported on 08/16/2022), Disp: 90 capsule, Rfl: 0

## 2022-09-30 ENCOUNTER — Encounter: Payer: Self-pay | Admitting: *Deleted

## 2022-10-06 ENCOUNTER — Institutional Professional Consult (permissible substitution): Payer: 59 | Admitting: Pulmonary Disease

## 2022-10-06 NOTE — Progress Notes (Deleted)
Synopsis: Referred in November 2023 for emphysema, history of adenocarcinoma status post right upper lobectomy in 2018  Subjective:   PATIENT ID: Heather [name] GENDER: female DOB: [birth year], MRN: [id number]   HPI  No chief complaint on file.   *** Record review: Seen by Dr. Shirline Frees in November 2023 after an annual surveillance CT scan showed no evidence of recurrent adenocarcinoma.  Has been monitored with expectant therapy since her lymph node dissection and right upper lobectomy in 2018 performed by Dr. Dorris Fetch.  Noted to have some emphysema on the CT scan, referred to [place] for the same.  Past Medical History:  Diagnosis Date   Blood dyscrasia    COPD (chronic obstructive pulmonary disease) ([facility])    Current smoker    Difficult intubation    pt was told difficult intubation at [facility]. 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   Headache    "weekly" (09/04/2017)   Heart murmur    Hypokalemia 10/15/2017   Lung adenocarcinoma dx'd 08/2017   s/p RU lobectomy   Pleuritic chest pain 06/13/2018   Pre-diabetes    Pulmonary nodule 07/16/2017   Rapid resting heart rate    happens infrequently   Rib pain on right side 12/21/2017     Family History  Problem Relation Age of Onset   Heart disease Mother    Heart disease Father    Colon polyps Father    Breast cancer Sister 18   Colon cancer Paternal Uncle    Colon polyps Paternal Uncle    Stomach cancer Maternal Grandmother      Social History   Socioeconomic History   Marital status: Divorced    Spouse name: Not on file   Number of children: 2   Years of education: Not on file   Highest education level: Not on file  Occupational History   Not on file  Tobacco Use   Smoking status: Some Days    Packs/day: 0.75    Years: 34.00    Total pack years: 25.50    Types: Cigarettes   Smokeless tobacco: Never   Tobacco comments:    1-3 cigarette per week currently  Vaping Use   Vaping Use: Never  used  Substance and Sexual Activity   Alcohol use: Not Currently    Alcohol/week: 0.0 standard drinks of alcohol    Comment: 09/04/2017 "a mixed drink a few times/year"   Drug use: No   Sexual activity: Not Currently  Other Topics Concern   Not on file  Social History Narrative   Not on file   Social Determinants of Health   Financial Resource Strain: Medium Risk (09/04/2017)   Overall Financial Resource Strain (CARDIA)    Difficulty of Paying Living Expenses: Somewhat hard  Food Insecurity: No Food Insecurity (09/04/2017)   Hunger Vital Sign    Worried About Running Out of Food in the Last Year: Never true    Ran Out of Food in the Last Year: Never true  Transportation Needs: No Transportation Needs (09/04/2017)   PRAPARE - Administrator, Civil Service (Medical): No    Lack of Transportation (Non-Medical): No  Physical Activity: Sufficiently Active (09/04/2017)   Exercise Vital Sign    Days of Exercise per Week: 3 days    Minutes of Exercise per Session: 60 min  Stress: Stress Concern Present (09/04/2017)   [employer] of Occupational Health - Occupational Stress Questionnaire    Feeling of Stress :  Very much  Social Connections: Moderately Isolated (09/04/2017)   Social Connection and Isolation Panel [NHANES]    Frequency of Communication with Friends and Family: Once a week    Frequency of Social Gatherings with Friends and Family: More than three times a week    Attends Religious Services: Never    Database administrator or Organizations: No    Attends Banker Meetings: Never    Marital Status: Divorced  Catering manager Violence: Not At Risk (09/04/2017)   Humiliation, Afraid, Rape, and Kick questionnaire    Fear of Current or Ex-Partner: No    Emotionally Abused: No    Physically Abused: No    Sexually Abused: No     Allergies  Allergen Reactions   Iohexol Shortness Of Breath, Swelling and Rash    Patient indicates she had swelling all  over her body as well as shortness of breath.  Even when she was premedicated, she broke out in a full body rash.   Aspirin Nausea And Vomiting     Outpatient Medications Prior to Visit  Medication Sig Dispense Refill   albuterol (PROVENTIL HFA) 108 (90 Base) MCG/ACT inhaler Inhale 2 puffs into the lungs every 6 (six) hours as needed for wheezing or shortness of breath. (Patient not taking: Reported on 08/16/2022) 8.5 g 2   nitroGLYCERIN (NITROSTAT) 0.4 MG SL tablet Place 1 tablet (0.4 mg total) under the tongue every 5 (five) minutes as needed for chest pain. (Patient not taking: Reported on 08/16/2022) 25 tablet 1   ondansetron (ZOFRAN) 4 MG tablet Take 1 tablet (4 mg total) by mouth every 8 (eight) hours as needed for nausea or vomiting. (Patient not taking: Reported on 08/16/2022) 30 tablet 1   predniSONE (DELTASONE) 50 MG tablet Take 1 tablet (50 mg total) by mouth 13 hours, 7 hours & 1 hour prior to CT scan. (Patient not taking: Reported on 08/16/2022) 3 tablet 0   tiotropium (SPIRIVA HANDIHALER) 18 MCG inhalation capsule Place 1 capsule (18 mcg total) into inhaler and inhale daily. (Patient not taking: Reported on 08/16/2022) 90 capsule 0   No facility-administered medications prior to visit.    ROS    Objective:  Physical Exam   There were no vitals filed for this visit.  ***  CBC    Component Value Date/Time   WBC 6.9 08/14/2022 1209   WBC 7.1 03/25/2020 1213   RBC 4.12 08/14/2022 1209   HGB 13.0 08/14/2022 1209   HGB 13.5 02/15/2021 1001   HGB 14.2 07/31/2017 1054   HCT 39.5 08/14/2022 1209   HCT 39.7 02/15/2021 1001   HCT 42.6 07/31/2017 1054   PLT 120 (L) 08/14/2022 1209   PLT 125 (L) 02/15/2021 1001   MCV 95.9 08/14/2022 1209   MCV 93 02/15/2021 1001   MCV 94.6 07/31/2017 1054   MCH 31.6 08/14/2022 1209   MCHC 32.9 08/14/2022 1209   RDW 13.6 08/14/2022 1209   RDW 13.4 02/15/2021 1001   RDW 13.5 07/31/2017 1054   LYMPHSABS 1.5 08/14/2022 1209   LYMPHSABS  1.4 02/15/2021 1001   LYMPHSABS 1.5 07/31/2017 1054   MONOABS 0.5 08/14/2022 1209   MONOABS 0.3 07/31/2017 1054   EOSABS 0.1 08/14/2022 1209   EOSABS 0.1 02/15/2021 1001   EOSABS 0.1 02/01/2011 1404   BASOSABS 0.1 08/14/2022 1209   BASOSABS 0.1 02/15/2021 1001   BASOSABS 0.1 07/31/2017 1054     Chest imaging: November 2023 CT chest images independently reviewed showing postoperative right upper  lobectomy findings, emphysema noted in the apices bilaterally no mass or nodule  PFT:  Labs:  Path:  Echo:  Heart Catheterization:       Assessment & Plan:   No diagnosis found.  Discussion: ***  Immunizations: Immunization History  Administered Date(s) Administered   Influenza,inj,Quad PF,6+ Mos 07/18/2017   Pneumococcal Polysaccharide-23 08/28/2017   Tdap 02/26/2018     Current Outpatient Medications:    albuterol (PROVENTIL HFA) 108 (90 Base) MCG/ACT inhaler, Inhale 2 puffs into the lungs every 6 (six) hours as needed for wheezing or shortness of breath. (Patient not taking: Reported on 08/16/2022), Disp: 8.5 g, Rfl: 2   nitroGLYCERIN (NITROSTAT) 0.4 MG SL tablet, Place 1 tablet (0.4 mg total) under the tongue every 5 (five) minutes as needed for chest pain. (Patient not taking: Reported on 08/16/2022), Disp: 25 tablet, Rfl: 1   ondansetron (ZOFRAN) 4 MG tablet, Take 1 tablet (4 mg total) by mouth every 8 (eight) hours as needed for nausea or vomiting. (Patient not taking: Reported on 08/16/2022), Disp: 30 tablet, Rfl: 1   predniSONE (DELTASONE) 50 MG tablet, Take 1 tablet (50 mg total) by mouth 13 hours, 7 hours & 1 hour prior to CT scan. (Patient not taking: Reported on 08/16/2022), Disp: 3 tablet, Rfl: 0   tiotropium (SPIRIVA HANDIHALER) 18 MCG inhalation capsule, Place 1 capsule (18 mcg total) into inhaler and inhale daily. (Patient not taking: Reported on 08/16/2022), Disp: 90 capsule, Rfl: 0

## 2022-10-13 ENCOUNTER — Other Ambulatory Visit: Payer: Self-pay | Admitting: Internal Medicine

## 2022-10-13 DIAGNOSIS — Z1231 Encounter for screening mammogram for malignant neoplasm of breast: Secondary | ICD-10-CM

## 2022-10-24 ENCOUNTER — Encounter (HOSPITAL_COMMUNITY): Payer: Self-pay

## 2022-10-24 ENCOUNTER — Ambulatory Visit (INDEPENDENT_AMBULATORY_CARE_PROVIDER_SITE_OTHER): Payer: 59

## 2022-10-24 ENCOUNTER — Other Ambulatory Visit (HOSPITAL_COMMUNITY): Payer: Self-pay

## 2022-10-24 ENCOUNTER — Ambulatory Visit (HOSPITAL_COMMUNITY)
Admission: RE | Admit: 2022-10-24 | Discharge: 2022-10-24 | Disposition: A | Discharge status: Home / Self Care | Payer: 59 | Source: Ambulatory Visit | Attending: Family Medicine | Admitting: Family Medicine

## 2022-10-24 VITALS — BP 111/66 | HR 62 | Temp 98.1°F | Resp 16

## 2022-10-24 DIAGNOSIS — R042 Hemoptysis: Secondary | ICD-10-CM | POA: Diagnosis not present

## 2022-10-24 DIAGNOSIS — Z1152 Encounter for screening for COVID-19: Secondary | ICD-10-CM | POA: Insufficient documentation

## 2022-10-24 DIAGNOSIS — R062 Wheezing: Secondary | ICD-10-CM | POA: Insufficient documentation

## 2022-10-24 DIAGNOSIS — J441 Chronic obstructive pulmonary disease with (acute) exacerbation: Secondary | ICD-10-CM | POA: Insufficient documentation

## 2022-10-24 DIAGNOSIS — R509 Fever, unspecified: Secondary | ICD-10-CM | POA: Insufficient documentation

## 2022-10-24 DIAGNOSIS — Z902 Acquired absence of lung [part of]: Secondary | ICD-10-CM | POA: Diagnosis not present

## 2022-10-24 DIAGNOSIS — R0602 Shortness of breath: Secondary | ICD-10-CM | POA: Diagnosis not present

## 2022-10-24 DIAGNOSIS — J029 Acute pharyngitis, unspecified: Secondary | ICD-10-CM | POA: Insufficient documentation

## 2022-10-24 DIAGNOSIS — R059 Cough, unspecified: Secondary | ICD-10-CM | POA: Diagnosis not present

## 2022-10-24 LAB — POC INFLUENZA A AND B ANTIGEN (URGENT CARE ONLY)
Influenza A Ag: NEGATIVE
Influenza B Ag: NEGATIVE

## 2022-10-24 MED ORDER — ALBUTEROL SULFATE HFA 108 (90 BASE) MCG/ACT IN AERS
2.0000 | INHALATION_SPRAY | Freq: Four times a day (QID) | RESPIRATORY_TRACT | 2 refills | Status: DC | PRN
  Filled 2022-10-24: qty 6.7, 25d supply, fill #0

## 2022-10-24 MED ORDER — DOXYCYCLINE HYCLATE 100 MG PO CAPS
100.0000 mg | ORAL_CAPSULE | Freq: Two times a day (BID) | ORAL | 0 refills | Status: DC
  Filled 2022-10-24: qty 20, 10d supply, fill #0

## 2022-10-24 MED ORDER — PREDNISONE 20 MG PO TABS
40.0000 mg | ORAL_TABLET | Freq: Every day | ORAL | 0 refills | Status: AC
  Filled 2022-10-24: qty 10, 5d supply, fill #0

## 2022-10-24 NOTE — Discharge Instructions (Addendum)
You were seen today for upper respiratory symptoms.  Your flu swab was negative.  I have swabbed you for covid today and this will be resulted tomorrow, although you are outside the window for any treatment if positive.  Your chest xray was negative for pneumonia or other bacterial infection.  I am treating you for a COPD exacerbation today.  I have sent out several meds to your pharmacy.  I have sent out doxycycline, prednisone, and a refill of albuterol to help with your symptoms.  I recommend you get plenty of rest and fluids.  Take tylenol for fever, chills and body aches.  Please follow up with your primary care provider for all your chronic medical conditions.

## 2022-10-24 NOTE — ED Triage Notes (Signed)
Pt is here for bilateral ear pain, sore throat, cough, nasal congestion, chills, body aches , fatigue, loss of appetite, chest congestion, and wheezing

## 2022-10-24 NOTE — ED Provider Notes (Signed)
[facility]    CSN: [id number] Arrival date & time: 10/24/22  1408      History   Chief Complaint Chief Complaint  Patient presents with   Fatigue    I am having fatigue, sore throat, runny nose, body aches, up and down fevers, cough, runny nose, chest pain and headaches and chills and wheezing in the evening and night with sweats - Entered by patient   Cough   Otalgia    HPI Heather [name] is a 52 y.o. female.   Patient is here for URI symptoms that started 4 days ago.  She is having extreme fatigue, headaches, body aches, chills.  Having a cough and runny nose.  Dry cough, other time yellow/green phlegm.  + post tussive emesis.  Having sob, some wheezing at night.  Will wake up sweating in the middle of the night.  She does have copd.  She has h/o lung cancer, trying to cut back on smoking.  Some low grade fevers off/on.  She has taken tylenol, motrin.  She has a small amount of albuterol left, and using that with some help.  She ran out of spiriva and has not contacted her pcp.         Past Medical History:  Diagnosis Date   Blood dyscrasia    COPD (chronic obstructive pulmonary disease) (HCC)    Current smoker    Difficult intubation    pt was told difficult intubation at 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   Headache    "weekly" (09/04/2017)   Heart murmur    Hypokalemia 10/15/2017   Lung adenocarcinoma dx'd 08/2017   s/p RU lobectomy   Pleuritic chest pain 06/13/2018   Pre-diabetes    Pulmonary nodule 07/16/2017   Rapid resting heart rate    happens infrequently   Rib pain on right side 12/21/2017    Patient Active Problem List   Diagnosis Date Noted   Viral upper respiratory tract infection with cough 10/14/2021   Family history of abdominal aortic aneurysm (AAA) 07/13/2021   Claudication in peripheral vascular disease (HCC) 07/13/2021   Emphysema/COPD (HCC) 04/14/2021   Cough with hemoptysis 07/21/2020    Otitis externa 05/18/2020   Insomnia 02/24/2020   Depression 02/24/2020   Visual aura 12/23/2019   Precordial pain 06/13/2018   S/P lobectomy of lung 08/20/2017   Cervical adenopathy 07/04/2017   Factor V Leiden (HCC) 04/13/2015   Prediabetes 04/13/2015   History of colon polyps 04/13/2015   Nonintractable headache 11/24/2014   H/O drug rash due to contrast  05/14/2014   Early satiety 04/28/2014   Loss of weight 04/02/2014   Cough 04/02/2014   Current smoker 04/02/2014   Routine health maintenance 04/02/2014   History of anemia 05/08/2007    Past Surgical History:  Procedure Laterality Date   BIOPSY  04/25/2021   Procedure: BIOPSY;  Surgeon: Sherrilyn Rist, MD;  Location: [facility];  Service: Gastroenterology;;  Egd and COLON   CESAREAN SECTION  1992; 2012   COLONOSCOPY N/A 05/15/2014   Procedure: COLONOSCOPY;  Surgeon: Hart Carwin, MD;  Location: [facility];  Service: Endoscopy;  Laterality: N/A;   COLONOSCOPY WITH PROPOFOL N/A 04/25/2021   Procedure: COLONOSCOPY WITH PROPOFOL;  Surgeon: Sherrilyn Rist, MD;  Location: [facility];  Service: Gastroenterology;  Laterality: N/A;   ESOPHAGOGASTRODUODENOSCOPY N/A 05/15/2014   Procedure: ESOPHAGOGASTRODUODENOSCOPY (EGD);  Surgeon: Hart Carwin, MD;  Location: [facility];  Service: Endoscopy;  Laterality: N/A;   ESOPHAGOGASTRODUODENOSCOPY (EGD) WITH PROPOFOL N/A 04/25/2021   Procedure: ESOPHAGOGASTRODUODENOSCOPY (EGD) WITH PROPOFOL;  Surgeon: Sherrilyn Rist, MD;  Location: [facility];  Service: Gastroenterology;  Laterality: N/A;   LAPAROSCOPY  07/07/2011   Procedure: LAPAROSCOPY DIAGNOSTIC;  Surgeon: Robley Fries;  Location: [facility];  Service: Gynecology;  Laterality: N/A;   LESION REMOVAL  07/07/2011   Procedure: EXCISION VAGINAL LESION;  Surgeon: Robley Fries;  Location: [facility];  Service: Gynecology;  Laterality: N/A;   LOBECTOMY Right 08/20/2017   Procedure: RIGHT UPPER LOBE LOBECTOMY;  Surgeon:  Loreli Slot, MD;  Location: [facility] OR;  Service: Thoracic;  Laterality: Right;   LYMPH NODE DISSECTION N/A 08/20/2017   Procedure: LYMPH NODE DISSECTION;  Surgeon: Loreli Slot, MD;  [facility];  Service: Thoracic;  Laterality: N/A;   POLYPECTOMY  04/25/2021   Procedure: POLYPECTOMY;  Surgeon: Sherrilyn Rist, MD;  Location: [facility];  Service: Gastroenterology;;   VAGINAL HYSTERECTOMY  ~ 2014   VIDEO ASSISTED THORACOSCOPY (VATS)/WEDGE RESECTION Right 08/20/2017   Procedure: RIGHT VIDEO ASSISTED THORACOSCOPY (VATS)/WEDGE RESECTION;  Surgeon: Loreli Slot, MD;  Location: [facility];  Service: Thoracic;  Laterality: Right;    OB History   No obstetric history on file.      Home Medications    Prior to Admission medications   Medication Sig Start Date End Date Taking? Authorizing Provider  albuterol (PROVENTIL HFA) 108 (90 Base) MCG/ACT inhaler Inhale 2 puffs into the lungs every 6 (six) hours as needed for wheezing or shortness of breath. Patient not taking: Reported on 08/16/2022 03/09/22   Earl Lagos, MD  nitroGLYCERIN (NITROSTAT) 0.4 MG SL tablet Place 1 tablet (0.4 mg total) under the tongue every 5 (five) minutes as needed for chest pain. Patient not taking: Reported on 08/16/2022 05/12/21   Patwardhan, Anabel Bene, MD  ondansetron (ZOFRAN) 4 MG tablet Take 1 tablet (4 mg total) by mouth every 8 (eight) hours as needed for nausea or vomiting. Patient not taking: Reported on 08/16/2022 02/17/21   Sherrilyn Rist, MD  predniSONE (DELTASONE) 50 MG tablet Take 1 tablet (50 mg total) by mouth 13 hours, 7 hours & 1 hour prior to CT scan. Patient not taking: Reported on 08/16/2022 02/03/22   Cantwell, Celeste C, PA-C  tiotropium (SPIRIVA HANDIHALER) 18 MCG inhalation capsule Place 1 capsule (18 mcg total) into inhaler and inhale daily. Patient not taking: Reported on 08/16/2022 05/04/22   Earl Lagos, MD    Family History Family History  Problem  Relation Age of Onset   Heart disease Mother    Heart disease Father    Colon polyps Father    Breast cancer Sister 53   Colon cancer Paternal Uncle    Colon polyps Paternal Uncle    Stomach cancer Maternal Grandmother     Social History Social History   Tobacco Use   Smoking status: Some Days    Packs/day: 0.75    Years: 34.00    Total pack years: 25.50    Types: Cigarettes   Smokeless tobacco: Never   Tobacco comments:    1-3 cigarette per week currently  Vaping Use   Vaping Use: Never used  Substance Use Topics   Alcohol use: Not Currently    Alcohol/week: 0.0 standard drinks of alcohol    Comment: 09/04/2017 "a mixed drink a few times/year"   Drug use: No     Allergies   Iohexol and  Aspirin   Review of Systems Review of Systems  Constitutional:  Positive for chills and fatigue. Negative for fever.  HENT:  Positive for congestion, ear pain, rhinorrhea and sore throat.   Respiratory:  Positive for cough, shortness of breath and wheezing.   Gastrointestinal:  Positive for vomiting. Negative for nausea.  Musculoskeletal: Negative.   Skin: Negative.   Psychiatric/Behavioral: Negative.       Physical Exam Triage Vital Signs ED Triage Vitals  Enc Vitals Group     BP 10/24/22 1435 111/66     Pulse Rate 10/24/22 1435 62     Resp 10/24/22 1435 16     Temp 10/24/22 1435 98.1 F (36.7 C)     Temp Source 10/24/22 1435 Oral     SpO2 10/24/22 1435 99 %     Weight --      Height --      Head Circumference --      Peak Flow --      Pain Score 10/24/22 1431 7     Pain Loc --      Pain Edu? --      Excl. in GC? --    No data found.  Updated Vital Signs BP 111/66 (BP Location: Left Arm)   Pulse 62   Temp 98.1 F (36.7 C) (Oral)   Resp 16   SpO2 99%   Visual Acuity Right Eye Distance:   Left Eye Distance:   Bilateral Distance:    Right Eye Near:   Left Eye Near:    Bilateral Near:     Physical Exam Constitutional:      General: She is not in  acute distress.    Appearance: Normal appearance. She is not ill-appearing.  HENT:     Nose: Congestion and rhinorrhea present.  Cardiovascular:     Rate and Rhythm: Normal rate and regular rhythm.  Pulmonary:     Effort: Pulmonary effort is normal.     Breath sounds: Normal breath sounds.  Musculoskeletal:     Cervical back: Normal range of motion and neck supple.  Skin:    General: Skin is warm.  Neurological:     General: No focal deficit present.     Mental Status: She is alert.  Psychiatric:        Mood and Affect: Mood normal.      UC Treatments / Results  Labs (all labs ordered are listed, but only abnormal results are displayed) Labs Reviewed  SARS CORONAVIRUS 2 (TAT 6-24 HRS)  POC INFLUENZA A AND B ANTIGEN (URGENT CARE ONLY)    EKG   Radiology DG Chest 2 View  Result Date: 10/24/2022 CLINICAL DATA:  Cough and shortness breath EXAM: CHEST - 2 VIEW COMPARISON:  X-ray dated December 28, 2021 FINDINGS: Cardiac and mediastinal contours are unchanged. Stable biapical pleural-parenchymal scarring. Stable postsurgical changes of the right upper lobe. Lungs are clear. Pleural effusion or pneumothorax. IMPRESSION: No active cardiopulmonary disease. Stable changes of prior right upper lobectomy. Electronically Signed   By: Allegra Lai M.D.   On: 10/24/2022 15:22    Procedures Procedures (including critical care time)  Medications Ordered in UC Medications - No data to display  Initial Impression / Assessment and Plan / UC Course  I have reviewed the triage vital signs and the nursing notes.  Pertinent labs & imaging results that were available during my care of the patient were reviewed by me and considered in my medical decision making (see chart for details).  Patient seen today for URI symptoms.  No wheezing at this time and breath sounds overall normal.  Chest xray negative.  I do not think she needs a breathing treatment here.  Flu negative.  Await covid results  although too late for anti-viral if positive.  Treat for copd exacerbation with doxy, prednisone and albuterol.    Final Clinical Impressions(s) / UC Diagnoses   Final diagnoses:  COPD exacerbation [facility])   Discharge Instructions   None    ED Prescriptions   None    PDMP not reviewed this encounter.   Jannifer Franklin, MD 10/24/22 [phone]

## 2022-10-25 LAB — SARS CORONAVIRUS 2 (TAT 6-24 HRS): SARS Coronavirus 2: NEGATIVE

## 2022-11-07 ENCOUNTER — Encounter: Payer: Self-pay | Admitting: Pulmonary Disease

## 2022-11-07 ENCOUNTER — Ambulatory Visit: Payer: 59 | Admitting: Pulmonary Disease

## 2022-11-07 ENCOUNTER — Other Ambulatory Visit (HOSPITAL_COMMUNITY): Payer: Self-pay

## 2022-11-07 VITALS — BP 122/76 | HR 87 | Temp 97.7°F | Ht 63.0 in | Wt 109.2 lb

## 2022-11-07 DIAGNOSIS — R0609 Other forms of dyspnea: Secondary | ICD-10-CM

## 2022-11-07 DIAGNOSIS — F17210 Nicotine dependence, cigarettes, uncomplicated: Secondary | ICD-10-CM | POA: Diagnosis not present

## 2022-11-07 DIAGNOSIS — J432 Centrilobular emphysema: Secondary | ICD-10-CM | POA: Diagnosis not present

## 2022-11-07 MED ORDER — BREZTRI AEROSPHERE 160-9-4.8 MCG/ACT IN AERO
2.0000 | INHALATION_SPRAY | Freq: Two times a day (BID) | RESPIRATORY_TRACT | 5 refills | Status: DC
  Filled 2022-11-07: qty 10.7, 30d supply, fill #0
  Filled 2022-12-13: qty 10.7, 30d supply, fill #1
  Filled 2023-01-30: qty 10.7, 30d supply, fill #2
  Filled 2023-03-22 – 2023-04-04 (×2): qty 10.7, 30d supply, fill #3

## 2022-11-07 NOTE — Progress Notes (Signed)
Synopsis: Referred in February 2024 for emphysema, has a history of stage I adenocarcinoma treated with right upper lobectomy and lymph node dissection by Dr. Roxan Hockey in 2018, currently followed by Dr. Earlie Server. Smoked as much as 2 packs a day, started age 52  Subjective:   PATIENT ID: Heather [name] GENDER: female DOB: 12-05-1970, MRN: [id number]   HPI  Chief Complaint  Patient presents with   Consult    Pt is being referred due to recent CT scan. Pt states she has problems with cough with occasional phlegm that ranges from clear to green in color and also has had problems with SOB.    Heather [name] has been struggling with dyspnea and cough.  Her cough ahs been persistent.  Activities have been associated with significant dyspnea.  She says that when she's walking and carrying things at work she gets short of breath.    Cough: > sometimes productive of mucus, usually dry > when productive typically clear but can be green > sometimes she'll have coughing spells that are associated with post tussive emesis > she can get significant dyspnea after coughing spells  > has been worse in the last year > No seasonal variability > hot weather makes it worse > No change with eating, she says she has no appetite > no heartburn/indigestion > has some chest pain, worse with cough, has followed with Dr. Dario Ave  Dyspnea:  > has been present since 2018 > has been limiting her activity > her director at work brought her oxygen to use once > she has noted her oxygen level drop when she is exerting herself  She was treated for a COPD exacerbation 2-3 weeks ago.  She's having them 2-3 times per year.   She' currently out of Spiriva.  She smokes 2-3 cigarettes a week right now.  Previously smoked.    Record review: November 2023 oncology records reviewed where the patient was seen in the context of stage Ia adenocarcinoma treated with resection in 2018, no evidence of recurrence, referred  to [place] for dry cough and emphysema  Past Medical History:  Diagnosis Date   Blood dyscrasia    COPD (chronic obstructive pulmonary disease) ([place])    Current smoker    Difficult intubation    pt was told 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   Headache    "weekly" (09/04/2017)   Heart murmur    Hypokalemia 10/15/2017   Lung adenocarcinoma dx'd 08/2017   s/p RU lobectomy   Pleuritic chest pain 06/13/2018   Pre-diabetes    Pulmonary nodule 07/16/2017   Rapid resting heart rate    happens infrequently   Rib pain on right side 12/21/2017     Family History  Problem Relation Age of Onset   Heart disease Mother    Heart disease Father    Colon polyps Father    Breast cancer Sister 8   Colon cancer Paternal Uncle    Colon polyps Paternal Uncle    Stomach cancer Maternal Grandmother      Social History   Socioeconomic History   Marital status: Divorced    Spouse name: Not on file   Number of children: 2   Years of education: Not on file   Highest education level: Not on file  Occupational History   Not on file  Tobacco Use   Smoking status: Some Days    Packs/day: 2.00    Years: 34.00  Total pack years: 68.00    Types: Cigarettes    Start date: 1984   Smokeless tobacco: Never   Tobacco comments:    1-3 cigarette per week currently  Vaping Use   Vaping Use: Never used  Substance and Sexual Activity   Alcohol use: Not Currently    Alcohol/week: 0.0 standard drinks of alcohol    Comment: 09/04/2017 "a mixed drink a few times/year"   Drug use: No   Sexual activity: Not Currently  Other Topics Concern   Not on file  Social History Narrative   Not on file   Social Determinants of Health   Financial Resource Strain: Medium Risk (09/04/2017)   Overall Financial Resource Strain (CARDIA)    Difficulty of Paying Living Expenses: Somewhat hard  Food Insecurity: No Food Insecurity (09/04/2017)   Hunger Vital Sign     Worried About Running Out of Food in the Last Year: Never true    Ran Out of Food in the Last Year: Never true  Transportation Needs: No Transportation Needs (09/04/2017)   PRAPARE - Hydrologist (Medical): No    Lack of Transportation (Non-Medical): No  Physical Activity: Sufficiently Active (09/04/2017)   Exercise Vital Sign    Days of Exercise per Week: 3 days    Minutes of Exercise per Session: 60 min  Stress: Stress Concern Present (09/04/2017)   [place]    Feeling of Stress : Very much  Social Connections: Moderately Isolated (09/04/2017)   Social Connection and Isolation Panel [NHANES]    Frequency of Communication with Friends and Family: Once a week    Frequency of Social Gatherings with Friends and Family: More than three times a week    Attends Religious Services: Never    Marine scientist or Organizations: No    Attends Archivist Meetings: Never    Marital Status: Divorced  Human resources officer Violence: Not At Risk (09/04/2017)   Humiliation, Afraid, Rape, and Kick questionnaire    Fear of Current or Ex-Partner: No    Emotionally Abused: No    Physically Abused: No    Sexually Abused: No     Allergies  Allergen Reactions   Iohexol Shortness Of Breath, Swelling and Rash    Patient indicates she had swelling all over her body as well as shortness of breath.  Even when she was premedicated, she broke out in a full body rash.   Aspirin Nausea And Vomiting     Outpatient Medications Prior to Visit  Medication Sig Dispense Refill   albuterol (PROVENTIL HFA) 108 (90 Base) MCG/ACT inhaler Inhale 2 puffs into the lungs every 6 (six) hours as needed for wheezing or shortness of breath. 8.5 g 2   doxycycline (VIBRAMYCIN) 100 MG capsule Take 1 capsule (100 mg total) by mouth 2 (two) times daily. 20 capsule 0   nitroGLYCERIN (NITROSTAT) 0.4 MG SL tablet Place 1 tablet (0.4  mg total) under the tongue every 5 (five) minutes as needed for chest pain. 25 tablet 1   tiotropium (SPIRIVA HANDIHALER) 18 MCG inhalation capsule Place 1 capsule (18 mcg total) into inhaler and inhale daily. (Patient not taking: Reported on 11/07/2022) 90 capsule 0   No facility-administered medications prior to visit.    Review of Systems  Constitutional:  Negative for chills, fever, malaise/fatigue and weight loss.  HENT:  Negative for congestion, nosebleeds, sinus pain and sore throat.   Eyes:  Negative  for photophobia, pain and discharge.  Respiratory:  Positive for cough, sputum production and shortness of breath. Negative for hemoptysis and wheezing.   Cardiovascular:  Negative for chest pain, palpitations, orthopnea and leg swelling.  Gastrointestinal:  Negative for abdominal pain, constipation, diarrhea, nausea and vomiting.  Genitourinary:  Negative for dysuria, frequency, hematuria and urgency.  Musculoskeletal:  Negative for back pain, joint pain, myalgias and neck pain.  Skin:  Negative for itching and rash.  Neurological:  Negative for tingling, tremors, sensory change, speech change, focal weakness, seizures, weakness and headaches.  Psychiatric/Behavioral:  Negative for memory loss, substance abuse and suicidal ideas. The patient is not nervous/anxious.       Objective:  Physical Exam   Vitals:   11/07/22 1036  BP: 122/76  Pulse: 87  Temp: 97.7 F (36.5 C)  TempSrc: Oral  SpO2: 100%  Weight: 109 lb 3.2 oz (49.5 kg)  Height: 5\' 3"  (1.6 m)    Gen: well appearing, no acute distress HENT: NCAT, OP clear, neck supple without masses Eyes: PERRL, EOMi Lymph: no cervical lymphadenopathy PULM: CTA B CV: RRR, no mgr, no JVD GI: BS+, soft, nontender, no hsm Derm: no rash or skin breakdown MSK: normal bulk and tone Neuro: A&Ox4, CN II-XII intact, strength 5/5 in all 4 extremities Psyche: normal mood and affect   CBC    Component Value Date/Time   WBC 6.9  08/14/2022 1209   WBC 7.1 03/25/2020 1213   RBC 4.12 08/14/2022 1209   HGB 13.0 08/14/2022 1209   HGB 13.5 02/15/2021 1001   HGB 14.2 07/31/2017 1054   HCT 39.5 08/14/2022 1209   HCT 39.7 02/15/2021 1001   HCT 42.6 07/31/2017 1054   PLT 120 (L) 08/14/2022 1209   PLT 125 (L) 02/15/2021 1001   MCV 95.9 08/14/2022 1209   MCV 93 02/15/2021 1001   MCV 94.6 07/31/2017 1054   MCH 31.6 08/14/2022 1209   MCHC 32.9 08/14/2022 1209   RDW 13.6 08/14/2022 1209   RDW 13.4 02/15/2021 1001   RDW 13.5 07/31/2017 1054   LYMPHSABS 1.5 08/14/2022 1209   LYMPHSABS 1.4 02/15/2021 1001   LYMPHSABS 1.5 07/31/2017 1054   MONOABS 0.5 08/14/2022 1209   MONOABS 0.3 07/31/2017 1054   EOSABS 0.1 08/14/2022 1209   EOSABS 0.1 02/15/2021 1001   EOSABS 0.1 02/01/2011 1404   BASOSABS 0.1 08/14/2022 1209   BASOSABS 0.1 02/15/2021 1001   BASOSABS 0.1 07/31/2017 1054     Chest imaging: November 2023 CT chest right upper lobectomy, upper lobe predominant paraseptal emphysema pleural and parenchymal scarring stable compared to prior, aortic atherosclerosis and coronary artery disease  PFT: 2022 ratio 67%, FEV1 1.86 L 69% predicted, 0% change with bronchodilator, total lung capacity 5.55 L 113% predicted, residual volume 156% predicted, DLCO 13.3 65% predicted  Labs:  Path:  Echo:  Heart Catheterization:       Assessment & Plan:   Centrilobular emphysema (HCC)  Dyspnea on exertion  Cigarette smoker  Discussion: Heather [name] has COPD with recurrent exacerbations due to ongoing cigarette smoking.  This is a plausible explanation for all of her symptoms: Shortness of breath, cough, mucus production.  We talked about the importance of quitting smoking today.  She is currently not on appropriate therapy for this degree of COPD.  Hopefully with initiation of appropriate inhaled therapy and quitting smoking she will see improvement in symptoms.  She is also physically deconditioned due to lack of exercise in  the face of recent shortness of breath.  Plan: Shortness of breath: After starting the inhaled therapy have prescribed try exercising more If necessary we could consider referral to pulmonary rehab Will check your oxygen level while walking today  COPD, Gold grade E: Stop smoking Stop Spiriva Start Breztri 2 puffs daily no matter how you feel Continue to use albuterol as needed for chest tightness wheezing or shortness of breath Practice good hand hygiene Stay physically active  Cigarette smoking: Stop Call 1-800-quit-NOW to get free nicotine replacement and counseling from the state of [place]  We will see you back in 6 weeks or sooner after starting Breztri to see how you are doing.  Immunizations: Immunization History  Administered Date(s) Administered   Influenza,inj,Quad PF,6+ Mos 07/18/2017   Pneumococcal Polysaccharide-23 08/28/2017   Tdap 02/26/2018     Current Outpatient Medications:    albuterol (PROVENTIL HFA) 108 (90 Base) MCG/ACT inhaler, Inhale 2 puffs into the lungs every 6 (six) hours as needed for wheezing or shortness of breath., Disp: 8.5 g, Rfl: 2   doxycycline (VIBRAMYCIN) 100 MG capsule, Take 1 capsule (100 mg total) by mouth 2 (two) times daily., Disp: 20 capsule, Rfl: 0   nitroGLYCERIN (NITROSTAT) 0.4 MG SL tablet, Place 1 tablet (0.4 mg total) under the tongue every 5 (five) minutes as needed for chest pain., Disp: 25 tablet, Rfl: 1   tiotropium (SPIRIVA HANDIHALER) 18 MCG inhalation capsule, Place 1 capsule (18 mcg total) into inhaler and inhale daily. (Patient not taking: Reported on 11/07/2022), Disp: 90 capsule, Rfl: 0

## 2022-11-07 NOTE — Patient Instructions (Signed)
Shortness of breath: After starting the inhaled therapy have prescribed try exercising more If necessary we could consider referral to pulmonary rehab Will check your oxygen level while walking today  COPD, Gold grade E: Stop smoking Stop Spiriva Start Breztri 2 puffs daily no matter how you feel Continue to use albuterol as needed for chest tightness wheezing or shortness of breath Practice good hand hygiene Stay physically active  Cigarette smoking: Stop Call 1-800-quit-NOW to get free nicotine replacement and counseling from the state of [place]  We will see you back in 6 weeks or sooner after starting Breztri to see how you are doing.

## 2022-12-04 ENCOUNTER — Ambulatory Visit: Payer: Commercial Managed Care - PPO

## 2023-01-31 ENCOUNTER — Other Ambulatory Visit: Payer: Self-pay | Admitting: Internal Medicine

## 2023-01-31 DIAGNOSIS — Z1231 Encounter for screening mammogram for malignant neoplasm of breast: Secondary | ICD-10-CM

## 2023-02-06 ENCOUNTER — Other Ambulatory Visit (HOSPITAL_COMMUNITY): Payer: Self-pay

## 2023-02-15 ENCOUNTER — Ambulatory Visit: Payer: 59 | Admitting: Adult Health

## 2023-02-23 ENCOUNTER — Encounter: Payer: Self-pay | Admitting: Adult Health

## 2023-03-05 ENCOUNTER — Ambulatory Visit
Admission: RE | Admit: 2023-03-05 | Discharge: 2023-03-05 | Disposition: A | Discharge status: Home / Self Care | Payer: 59 | Source: Ambulatory Visit

## 2023-03-05 DIAGNOSIS — Z1231 Encounter for screening mammogram for malignant neoplasm of breast: Secondary | ICD-10-CM

## 2023-03-14 ENCOUNTER — Other Ambulatory Visit: Payer: Self-pay | Admitting: Internal Medicine

## 2023-03-14 DIAGNOSIS — Z1231 Encounter for screening mammogram for malignant neoplasm of breast: Secondary | ICD-10-CM

## 2023-04-03 ENCOUNTER — Other Ambulatory Visit (HOSPITAL_COMMUNITY): Payer: Self-pay

## 2023-04-03 ENCOUNTER — Encounter: Payer: Self-pay | Admitting: Internal Medicine

## 2023-04-03 NOTE — Progress Notes (Unsigned)
[facility]: Clinic Note  Subjective:  History of Present Illness: Heather [name] is a 52 y.o. year old female who presents for routine follow up of her chronic medical conditions. She was Dr. Charlean Sanfilippo patient and this is our first visit together. She has not been seen in our clinic in over a year.  # COPD/emphysema - gold E # tobacco use  # h/o stage 1 adenocarcinoma of lung s/p RUL lobectomy & LN dissection  - pulm started breztri 2 puffs daily  - for cancer, sees mohommad once yearly for CT  # anxiety - was seeing [place] - get phq9 and gad 7   Due for pap    Please refer to Assessment and Plan below for full details in Problem-Based Charting.   Past Medical History:  Patient Active Problem List   Diagnosis Date Noted   Viral upper respiratory tract infection with cough 10/14/2021   Family history of abdominal aortic aneurysm (AAA) 07/13/2021   Claudication in peripheral vascular disease (HCC) 07/13/2021   Emphysema/COPD (HCC) 04/14/2021   Cough with hemoptysis 07/21/2020   Otitis externa 05/18/2020   Insomnia 02/24/2020   Depression 02/24/2020   Visual aura 12/23/2019   Precordial pain 06/13/2018   S/P lobectomy of lung 08/20/2017   Cervical adenopathy 07/04/2017   Factor V Leiden (HCC) 04/13/2015   Prediabetes 04/13/2015   History of colon polyps 04/13/2015   Nonintractable headache 11/24/2014   H/O drug rash due to contrast  05/14/2014   Early satiety 04/28/2014   Loss of weight 04/02/2014   Cough 04/02/2014   Current smoker 04/02/2014   Routine health maintenance 04/02/2014   History of anemia 05/08/2007      Medications:  Current Outpatient Medications:    albuterol (PROVENTIL HFA) 108 (90 Base) MCG/ACT inhaler, Inhale 2 puffs into the lungs every 6 (six) hours as needed for wheezing or shortness of breath., Disp: 8.5 g, Rfl: 2   Budeson-Glycopyrrol-Formoterol (BREZTRI AEROSPHERE) 160-9-4.8 MCG/ACT AERO, Inhale 2 puffs into  the lungs in the morning and at bedtime., Disp: 10.7 g, Rfl: 5   doxycycline (VIBRAMYCIN) 100 MG capsule, Take 1 capsule (100 mg total) by mouth 2 (two) times daily., Disp: 20 capsule, Rfl: 0   nitroGLYCERIN (NITROSTAT) 0.4 MG SL tablet, Place 1 tablet (0.4 mg total) under the tongue every 5 (five) minutes as needed for chest pain., Disp: 25 tablet, Rfl: 1   Allergies: Allergies  Allergen Reactions   Iohexol Shortness Of Breath, Swelling and Rash    Patient indicates she had swelling all over her body as well as shortness of breath.  Even when she was premedicated, she broke out in a full body rash.   Aspirin Nausea And Vomiting       Objective:   Vitals: There were no vitals filed for this visit.   Physical Exam: Physical Exam   Data: Labs, imaging, and micro were reviewed in Epic. Refer to Assessment and Plan below for full details in Problem-Based Charting.  Assessment & Plan:  No problem-specific Assessment & Plan notes found for this encounter.     Patient will follow up in ***  Mercie Eon, MD

## 2023-04-04 ENCOUNTER — Encounter: Payer: Self-pay | Admitting: Internal Medicine

## 2023-04-04 ENCOUNTER — Other Ambulatory Visit: Payer: Self-pay

## 2023-04-04 ENCOUNTER — Ambulatory Visit (INDEPENDENT_AMBULATORY_CARE_PROVIDER_SITE_OTHER): Payer: 59 | Admitting: Internal Medicine

## 2023-04-04 ENCOUNTER — Other Ambulatory Visit (HOSPITAL_COMMUNITY): Payer: Self-pay

## 2023-04-04 ENCOUNTER — Ambulatory Visit (HOSPITAL_COMMUNITY)
Admission: RE | Admit: 2023-04-04 | Discharge: 2023-04-04 | Disposition: A | Discharge status: Home / Self Care | Payer: 59 | Source: Ambulatory Visit | Attending: Internal Medicine | Admitting: Internal Medicine

## 2023-04-04 VITALS — BP 102/60 | HR 75 | Temp 98.3°F | Ht 63.0 in | Wt 105.5 lb

## 2023-04-04 DIAGNOSIS — R072 Precordial pain: Secondary | ICD-10-CM

## 2023-04-04 DIAGNOSIS — F17200 Nicotine dependence, unspecified, uncomplicated: Secondary | ICD-10-CM

## 2023-04-04 DIAGNOSIS — R079 Chest pain, unspecified: Secondary | ICD-10-CM | POA: Diagnosis not present

## 2023-04-04 DIAGNOSIS — N6311 Unspecified lump in the right breast, upper outer quadrant: Secondary | ICD-10-CM | POA: Insufficient documentation

## 2023-04-04 DIAGNOSIS — F17210 Nicotine dependence, cigarettes, uncomplicated: Secondary | ICD-10-CM

## 2023-04-04 DIAGNOSIS — G4700 Insomnia, unspecified: Secondary | ICD-10-CM | POA: Diagnosis not present

## 2023-04-04 DIAGNOSIS — J432 Centrilobular emphysema: Secondary | ICD-10-CM | POA: Diagnosis not present

## 2023-04-04 DIAGNOSIS — N6310 Unspecified lump in the right breast, unspecified quadrant: Secondary | ICD-10-CM

## 2023-04-04 DIAGNOSIS — Z716 Tobacco abuse counseling: Secondary | ICD-10-CM

## 2023-04-04 DIAGNOSIS — F32A Depression, unspecified: Secondary | ICD-10-CM

## 2023-04-04 DIAGNOSIS — F331 Major depressive disorder, recurrent, moderate: Secondary | ICD-10-CM

## 2023-04-04 LAB — TROPONIN I (HIGH SENSITIVITY): Troponin I (High Sensitivity): <2 ng/L (ref <18)

## 2023-04-04 MED ORDER — MIRTAZAPINE 15 MG PO TABS
15.0000 mg | ORAL_TABLET | Freq: Every day | ORAL | 3 refills | Status: DC
  Filled 2023-04-04: qty 90, 90d supply, fill #0
  Filled 2023-06-25: qty 90, 90d supply, fill #1
  Filled 2023-09-12: qty 90, 90d supply, fill #2
  Filled 2023-11-06 – 2023-12-17 (×2): qty 90, 90d supply, fill #3

## 2023-04-04 NOTE — Assessment & Plan Note (Signed)
Difficulty sleeping for last several months. In the past she took mirtazapine with improvement in symptoms. She is able to fall asleep but no stay asleep. She is using melatonin without improvement. P: Start mirtazapine 15 mg Reviewed sleep hygiene

## 2023-04-04 NOTE — Assessment & Plan Note (Signed)
Could consider wellbutrin in future if depression does not improve with remeron

## 2023-04-04 NOTE — Progress Notes (Signed)
Subjective:  CC: chest pain  HPI:  Heather [name] is a 52 y.o. female with a past medical history stated below and presents today for chest pain. Please see problem based assessment and plan for additional details.  Past Medical History:  Diagnosis Date   Blood dyscrasia    COPD (chronic obstructive pulmonary disease) (HCC)    Current smoker    Difficult intubation    pt was told difficult intubation at [facility]   Factor V deficiency (HCC)    protein deficiency   H/O drug rash due to contrast  05/14/2014   Headache    "weekly" (09/04/2017)   Heart murmur    Hypokalemia 10/15/2017   Lung adenocarcinoma dx'd 08/2017   s/p RU lobectomy   Pleuritic chest pain 06/13/2018   Pre-diabetes    Pulmonary nodule 07/16/2017   Rapid resting heart rate    happens infrequently   Rib pain on right side 12/21/2017    Current Outpatient Medications on File Prior to Visit  Medication Sig Dispense Refill   albuterol (PROVENTIL HFA) 108 (90 Base) MCG/ACT inhaler Inhale 2 puffs into the lungs every 6 (six) hours as needed for wheezing or shortness of breath. 8.5 g 2   Budeson-Glycopyrrol-Formoterol (BREZTRI AEROSPHERE) 160-9-4.8 MCG/ACT AERO Inhale 2 puffs into the lungs in the morning and at bedtime. 10.7 g 5   nitroGLYCERIN (NITROSTAT) 0.4 MG SL tablet Place 1 tablet (0.4 mg total) under the tongue every 5 (five) minutes as needed for chest pain. 25 tablet 1   No current facility-administered medications on file prior to visit.    Family History  Problem Relation Age of Onset   Heart disease Mother    Heart disease Father    Colon polyps Father    Breast cancer Sister 69   Colon cancer Paternal Uncle    Colon polyps Paternal Uncle    Stomach cancer Maternal Grandmother     Social History   Socioeconomic History   Marital status: Divorced    Spouse name: Not on file   Number of children: 2   Years of education: Not on file   Highest education level: Not on file   Occupational History   Not on file  Tobacco Use   Smoking status: Some Days    Packs/day: 2.00    Years: 34.00    Additional pack years: 0.00    Total pack years: 68.00    Types: Cigarettes    Start date: 1984   Smokeless tobacco: Never   Tobacco comments:    1-3 cigarette per week currently  Vaping Use   Vaping Use: Never used  Substance and Sexual Activity   Alcohol use: Not Currently    Alcohol/week: 0.0 standard drinks of alcohol    Comment: 09/04/2017 "a mixed drink a few times/year"   Drug use: No   Sexual activity: Not Currently  Other Topics Concern   Not on file  Social History Narrative   Not on file   Social Determinants of Health   Financial Resource Strain: Medium Risk (09/04/2017)   Overall Financial Resource Strain (CARDIA)    Difficulty of Paying Living Expenses: Somewhat hard  Food Insecurity: No Food Insecurity (09/04/2017)   Hunger Vital Sign    Worried About Running Out of Food in the Last Year: Never true    Ran Out of Food in the Last Year: Never true  Transportation Needs: No Transportation Needs (09/04/2017)   PRAPARE - Transportation  Lack of Transportation (Medical): No    Lack of Transportation (Non-Medical): No  Physical Activity: Sufficiently Active (09/04/2017)   Exercise Vital Sign    Days of Exercise per Week: 3 days    Minutes of Exercise per Session: 60 min  Stress: Stress Concern Present (09/04/2017)   [employer] of Occupational Health - Occupational Stress Questionnaire    Feeling of Stress : Very much  Social Connections: Moderately Isolated (09/04/2017)   Social Connection and Isolation Panel [NHANES]    Frequency of Communication with Friends and Family: Once a week    Frequency of Social Gatherings with Friends and Family: More than three times a week    Attends Religious Services: Never    Database administrator or Organizations: No    Attends Banker Meetings: Never    Marital Status: Divorced  Careers information officer Violence: Not At Risk (09/04/2017)   Humiliation, Afraid, Rape, and Kick questionnaire    Fear of Current or Ex-Partner: No    Emotionally Abused: No    Physically Abused: No    Sexually Abused: No    Review of Systems: ROS negative except for what is noted on the assessment and plan.  Objective:   Vitals:   04/04/23 0947  BP: 102/60  Pulse: 75  Temp: 98.3 F (36.8 C)  TempSrc: Oral  SpO2: 100%  Weight: 105 lb 8 oz (47.9 kg)  Height: 5\' 3"  (1.6 m)    Physical Exam: Constitutional: well-appearing  Cardiovascular: regular rate and rhythm, no m/r/g Pulmonary/Chest: normal work of breathing on room air, lungs clear to auscultation bilaterally Abdominal: soft, non-tender, non-distended MSK: breast exam performed with nurse in room, palpable round mobile mass to right breast Skin: warm and dry   Assessment & Plan:  Precordial pain Over the last few weeks she has had worsening of chest pain. Pain is central and feels like pressure. This worsens with exertion and improves with rest. She also have stabbing pain with deep inspiration. Previously seen by cardiology, Dr. Rosemary Holms, in 2022 for chest pain. Nuclear stress test 10/22 without ischemia. Echo showed structurally normal heart. Pain thought to be non-cardiac. She followed up with cardiology 05/23 due to worsening of chest pain. EKG at that time was no ischemic changes and troponin was normal.   On exam she does have reproducible pain with palpation of chest but she reports this feels separate from deep constant pain. EKG showed early repolarization in leads V2,3, and 4 which is unchanged from prior. Troponin normal.  P: No signs concerning for ACS at this time. Her pattern of pain is atypical but parts are concerning for angina. I asked that she follow-up with cardiology. I do question of chest discomfort is related to COPD. She endorses adherence with inhaler and no cardinal symptoms are present for COPD  exacerbation.  Insomnia Difficulty sleeping for last several months. In the past she took mirtazapine with improvement in symptoms. She is able to fall asleep but no stay asleep. She is using melatonin without improvement. P: Start mirtazapine 15 mg Reviewed sleep hygiene  Current smoker Could consider wellbutrin in future if depression does not improve with remeron  Breast lump on right side at 11 o'clock position She noted pain in right breast for last few months. She thought from incision with lobectomy. Mobile round mass present at 11 oclock. P: Diagnostic mammogram   Patient discussed with Dr. Dionne Bucy Robby Bulkley, D.O. [facility]  [id number] Pager: [phone]  Phone: [phone] Date 04/04/2023  Time 6:02 PM

## 2023-04-04 NOTE — Assessment & Plan Note (Signed)
She noted pain in right breast for last few months. She thought from incision with lobectomy. Mobile round mass present at 11 oclock. P: Diagnostic mammogram

## 2023-04-04 NOTE — Patient Instructions (Addendum)
Thank you, Ms.Skylen Jabier Mutton for allowing [place] to provide your care today.   Chest pain Your EKG was reassuring, I am getting a lab to check your heart enzymes as well. Please go back to cardiology. There are some parts of your chest pain that sound like heart and some parts that sound like from your lungs. Be sure to pick up your inhaler  The best thing you can do for your lungs is to stop smoking.  Insomnia/ Depression Start Remeron nightly.  I ordered a diagnostic mammogram.  I have ordered the following labs for you:  troponin  I have ordered the following medication/changed the following medications:   Stop the following medications: Medications Discontinued During This Encounter  Medication Reason   doxycycline (VIBRAMYCIN) 100 MG capsule      Start the following medications: Meds ordered this encounter  Medications   mirtazapine (REMERON) 15 MG tablet    Sig: Take 1 tablet (15 mg total) by mouth at bedtime.    Dispense:  90 tablet    Refill:  3     Follow up:  4 weeks  We look forward to seeing you next time. Please call our clinic at [phone] if you have any questions or concerns. The best time to call is Monday-Friday from 9am-4pm, but there is someone available 24/7. If after hours or the weekend, call the main hospital number and ask for the Internal Medicine Resident On-Call. If you need medication refills, please notify your pharmacy one week in advance and they will send [place] a request.   Thank you for trusting me with your care. Wishing you the best!   Rudene Christians, DO [facility]

## 2023-04-04 NOTE — Assessment & Plan Note (Signed)
Over the last few weeks she has had worsening of chest pain. Pain is central and feels like pressure. This worsens with exertion and improves with rest. She also have stabbing pain with deep inspiration. Previously seen by cardiology, Dr. Rosemary Holms, in 2022 for chest pain. Nuclear stress test 10/22 without ischemia. Echo showed structurally normal heart. Pain thought to be non-cardiac. She followed up with cardiology 05/23 due to worsening of chest pain. EKG at that time was no ischemic changes and troponin was normal.   On exam she does have reproducible pain with palpation of chest but she reports this feels separate from deep constant pain. EKG showed early repolarization in leads V2,3, and 4 which is unchanged from prior. Troponin normal.  P: No signs concerning for ACS at this time. Her pattern of pain is atypical but parts are concerning for angina. I asked that she follow-up with cardiology. I do question of chest discomfort is related to COPD. She endorses adherence with inhaler and no cardinal symptoms are present for COPD exacerbation.

## 2023-04-10 NOTE — Progress Notes (Signed)
Internal Medicine Clinic Attending  Case discussed with the resident at the time of the visit.  We reviewed the resident's history and exam and pertinent patient test results.  I agree with the assessment, diagnosis, and plan of care documented in the resident's note.  

## 2023-05-02 ENCOUNTER — Encounter: Payer: 59 | Admitting: Student

## 2023-05-03 ENCOUNTER — Other Ambulatory Visit: Payer: Self-pay | Admitting: Internal Medicine

## 2023-05-03 ENCOUNTER — Telehealth: Payer: Self-pay | Admitting: Internal Medicine

## 2023-05-03 DIAGNOSIS — N6310 Unspecified lump in the right breast, unspecified quadrant: Secondary | ICD-10-CM

## 2023-05-10 ENCOUNTER — Other Ambulatory Visit: Payer: 59

## 2023-05-21 ENCOUNTER — Ambulatory Visit
Admission: RE | Admit: 2023-05-21 | Discharge: 2023-05-21 | Disposition: A | Discharge status: Home / Self Care | Payer: 59 | Source: Ambulatory Visit | Attending: Internal Medicine | Admitting: Internal Medicine

## 2023-05-21 ENCOUNTER — Other Ambulatory Visit: Payer: Self-pay | Admitting: Internal Medicine

## 2023-05-21 ENCOUNTER — Ambulatory Visit: Admission: RE | Admit: 2023-05-21 | Payer: 59 | Source: Ambulatory Visit

## 2023-05-21 DIAGNOSIS — N6310 Unspecified lump in the right breast, unspecified quadrant: Secondary | ICD-10-CM

## 2023-05-21 DIAGNOSIS — N644 Mastodynia: Secondary | ICD-10-CM | POA: Diagnosis not present

## 2023-05-28 NOTE — Progress Notes (Unsigned)
[facility]: Clinic Note  Subjective:  History of Present Illness: Heather [name] is a 52 y.o. year old female who presents for routine follow up. I have not met her yet. She saw Dr. Sloan Leiter in July.  She's doing well.   Her one concern today is intermittent right leg pain. She had 2 episodes of her right leg having a severe spasm. At night, her legs and feet cramp, and "feel like they want to take off."  Sleep has been better since starting Remeron 15mg  at night at last visit. Phq9 7. Gad 7 8 .  Not dizzy or lightheaded.  Smoking a few cigarettes weekly, interested in quitting.   Please refer to Assessment and Plan below for full details in Problem-Based Charting.   Past Medical History:  Patient Active Problem List   Diagnosis Date Noted   Leg cramp 05/30/2023   Breast lump on right side at 11 o'clock position 04/04/2023   Family history of abdominal aortic aneurysm (AAA) 07/13/2021   Claudication in peripheral vascular disease (HCC) 07/13/2021   Insomnia 02/24/2020   Depression, major, recurrent (HCC) 02/24/2020   Visual aura 12/23/2019   Precordial pain 06/13/2018   S/P lobectomy of lung 08/20/2017   Cervical adenopathy 07/04/2017   Factor V Leiden (HCC) 04/13/2015   Prediabetes 04/13/2015   History of colon polyps 04/13/2015   Nonintractable headache 11/24/2014   Early satiety 04/28/2014   Loss of weight 04/02/2014   Current smoker 04/02/2014   Health care maintenance 04/02/2014   Emphysema/COPD (HCC) 04/02/2014      Medications:  Current Outpatient Medications:    albuterol (PROVENTIL HFA) 108 (90 Base) MCG/ACT inhaler, Inhale 2 puffs into the lungs every 6 (six) hours as needed for wheezing or shortness of breath., Disp: 8.5 g, Rfl: 2   Budeson-Glycopyrrol-Formoterol (BREZTRI AEROSPHERE) 160-9-4.8 MCG/ACT AERO, Inhale 2 puffs into the lungs in the morning and at bedtime., Disp: 10.7 g, Rfl: 5   buPROPion (WELLBUTRIN XL) 150 MG 24 hr  tablet, Take 1 tablet (150 mg total) by mouth daily., Disp: 30 tablet, Rfl: 3   mirtazapine (REMERON) 15 MG tablet, Take 1 tablet (15 mg total) by mouth at bedtime., Disp: 90 tablet, Rfl: 3   nitroGLYCERIN (NITROSTAT) 0.4 MG SL tablet, Place 1 tablet (0.4 mg total) under the tongue every 5 (five) minutes as needed for chest pain., Disp: 25 tablet, Rfl: 1   Allergies: Allergies  Allergen Reactions   Iohexol Shortness Of Breath, Swelling and Rash    Patient indicates she had swelling all over her body as well as shortness of breath.  Even when she was premedicated, she broke out in a full body rash.   Aspirin Nausea And Vomiting       Objective:   Vitals: Vitals:   05/30/23 0956 05/30/23 1023  BP: (!) 93/58 (!) 106/51  Pulse: 75 75  Temp: 97.7 F (36.5 C)   SpO2: 100%      Physical Exam: Physical Exam Constitutional:      Appearance: Normal appearance.  Cardiovascular:     Rate and Rhythm: Normal rate and regular rhythm.     Heart sounds: Normal heart sounds.  Pulmonary:     Effort: Pulmonary effort is normal.     Breath sounds: Normal breath sounds. No wheezing.  Neurological:     Mental Status: She is alert.  Psychiatric:        Mood and Affect: Mood normal.  Behavior: Behavior normal.      Data: Labs, imaging, and micro were reviewed in Epic. Refer to Assessment and Plan below for full details in Problem-Based Charting.  Assessment & Plan:  Current smoker - Start Wellbutrin 150mg  daily for 30d trial. If she has worsening insomnia on this, stop the medicine, and let me know - She is down from 2ppd to a few cigarettes weekly  Health care maintenance - She will get her flu shot with work at [employer] - I encouraged her to get pap, she declined today, will consider next time  Leg cramp - Ddx includes restless legs, possibly from IDA, vs low potassium, low magnesium. Check labs today.  - Does not sound like claudication or DVT  Insomnia - Continue Mirtazapine  15mg  nightly   Depression, major, recurrent (HCC) - PHQ9 is 7 today, which is improved from 15 at a prior visit. - Continue Remeron for now - Will try Wellbutrin - if it is too activating, I will stop this     Patient will follow up in 6 months, hopefully for pap   Mercie Eon, MD

## 2023-05-30 ENCOUNTER — Other Ambulatory Visit (HOSPITAL_COMMUNITY): Payer: Self-pay

## 2023-05-30 ENCOUNTER — Ambulatory Visit (INDEPENDENT_AMBULATORY_CARE_PROVIDER_SITE_OTHER): Payer: 59 | Admitting: Internal Medicine

## 2023-05-30 ENCOUNTER — Encounter: Payer: Self-pay | Admitting: Internal Medicine

## 2023-05-30 ENCOUNTER — Telehealth: Payer: Self-pay

## 2023-05-30 VITALS — BP 106/51 | HR 75 | Temp 97.7°F | Ht 63.0 in | Wt 109.8 lb

## 2023-05-30 DIAGNOSIS — Z131 Encounter for screening for diabetes mellitus: Secondary | ICD-10-CM | POA: Diagnosis not present

## 2023-05-30 DIAGNOSIS — F17200 Nicotine dependence, unspecified, uncomplicated: Secondary | ICD-10-CM

## 2023-05-30 DIAGNOSIS — Z0000 Encounter for general adult medical examination without abnormal findings: Secondary | ICD-10-CM

## 2023-05-30 DIAGNOSIS — G4700 Insomnia, unspecified: Secondary | ICD-10-CM | POA: Diagnosis not present

## 2023-05-30 DIAGNOSIS — R252 Cramp and spasm: Secondary | ICD-10-CM

## 2023-05-30 DIAGNOSIS — F331 Major depressive disorder, recurrent, moderate: Secondary | ICD-10-CM | POA: Diagnosis not present

## 2023-05-30 DIAGNOSIS — F17210 Nicotine dependence, cigarettes, uncomplicated: Secondary | ICD-10-CM | POA: Diagnosis not present

## 2023-05-30 DIAGNOSIS — R7303 Prediabetes: Secondary | ICD-10-CM

## 2023-05-30 LAB — POCT GLYCOSYLATED HEMOGLOBIN (HGB A1C): Hemoglobin A1C: 5.5 % (ref 4.0–5.6)

## 2023-05-30 LAB — GLUCOSE, CAPILLARY: Glucose-Capillary: 69 mg/dL — ABNORMAL LOW (ref 70–99)

## 2023-05-30 MED ORDER — BUPROPION HCL ER (XL) 150 MG PO TB24
150.0000 mg | ORAL_TABLET | Freq: Every day | ORAL | 3 refills | Status: DC
  Filled 2023-05-30: qty 30, 30d supply, fill #0
  Filled 2023-06-25: qty 30, 30d supply, fill #1
  Filled 2023-09-12: qty 30, 30d supply, fill #2
  Filled 2023-11-06: qty 30, 30d supply, fill #3

## 2023-05-30 NOTE — Assessment & Plan Note (Signed)
-   PHQ9 is 7 today, which is improved from 15 at a prior visit. - Continue Remeron for now - Will try Wellbutrin - if it is too activating, I will stop this

## 2023-05-30 NOTE — Assessment & Plan Note (Signed)
-   Ddx includes restless legs, possibly from IDA, vs low potassium, low magnesium. Check labs today.  - Does not sound like claudication or DVT

## 2023-05-30 NOTE — Assessment & Plan Note (Signed)
-   Continue Mirtazapine 15mg  nightly

## 2023-05-30 NOTE — Patient Instructions (Signed)
Dear Ms Kwok,  It was a pleasure meeting you today.  I will call you with your lab results later this week.  I have sent in a new medicine, Wellbutrin, to your pharmacy. Please take 1 pill once daily to help you quit smoking. If you have trouble sleeping with this medicine, please stop the medicine and please let me know - you can send a message on MYChart or call our office. Yo   I'll see you back in 6 months. I'd like to do your Pap at that visit if possible.  Sincerely, Dr. Mercie Eon

## 2023-05-30 NOTE — Assessment & Plan Note (Signed)
-   Start Wellbutrin 150mg  daily for 30d trial. If she has worsening insomnia on this, stop the medicine, and let me know - She is down from 2ppd to a few cigarettes weekly

## 2023-05-30 NOTE — Assessment & Plan Note (Signed)
-   She will get her flu shot with work at [employer] - I encouraged her to get pap, she declined today, will consider next time

## 2023-05-31 LAB — CBC
Hematocrit: 38.7 % (ref 34.0–46.6)
Hemoglobin: 13 g/dL (ref 11.1–15.9)
MCH: 30.7 pg (ref 26.6–33.0)
MCHC: 33.6 g/dL (ref 31.5–35.7)
MCV: 91 fL (ref 79–97)
Platelets: 126 10*3/uL — ABNORMAL LOW (ref 150–450)
RBC: 4.24 x10E6/uL (ref 3.77–5.28)
RDW: 13.4 % (ref 11.7–15.4)
WBC: 7.4 10*3/uL (ref 3.4–10.8)

## 2023-05-31 LAB — MAGNESIUM: Magnesium: 2.2 mg/dL (ref 1.6–2.3)

## 2023-05-31 LAB — BMP8+ANION GAP
Anion Gap: 15 mmol/L (ref 10.0–18.0)
BUN/Creatinine Ratio: 9 (ref 9–23)
BUN: 9 mg/dL (ref 6–24)
CO2: 24 mmol/L (ref 20–29)
Calcium: 8.9 mg/dL (ref 8.7–10.2)
Chloride: 103 mmol/L (ref 96–106)
Creatinine, Ser: 1.01 mg/dL — ABNORMAL HIGH (ref 0.57–1.00)
Glucose: 80 mg/dL (ref 70–99)
Potassium: 3.8 mmol/L (ref 3.5–5.2)
Sodium: 142 mmol/L (ref 134–144)
eGFR: 67 mL/min/{1.73_m2} (ref >59)

## 2023-05-31 LAB — IRON,TIBC AND FERRITIN PANEL
Ferritin: 122 ng/mL (ref 15–150)
Iron Saturation: 28 % (ref 15–55)
Iron: 78 ug/dL (ref 27–159)
Total Iron Binding Capacity: 283 ug/dL (ref 250–450)
UIBC: 205 ug/dL (ref 131–425)

## 2023-06-01 NOTE — Progress Notes (Signed)
Dear Ms Yanik,  It was a pleasure seeing you in clinic this week.  Your labs all look great. Your iron levels are normal. Your blood counts are normal and your electrolytes as normal.   Please continue taking your medicines as you are, and let me know if the leg cramps do not improve.   Sincerely, Dr. Mercie Eon

## 2023-06-25 ENCOUNTER — Other Ambulatory Visit: Payer: Self-pay

## 2023-08-08 ENCOUNTER — Telehealth: Payer: Self-pay | Admitting: *Deleted

## 2023-08-08 ENCOUNTER — Other Ambulatory Visit (HOSPITAL_COMMUNITY): Payer: Self-pay

## 2023-08-08 MED ORDER — PREDNISONE 50 MG PO TABS: ORAL_TABLET | ORAL | 0 refills | Status: DC

## 2023-08-08 MED ORDER — PREDNISONE 50 MG PO TABS
50.0000 mg | ORAL_TABLET | ORAL | 0 refills | Status: DC
  Filled 2023-08-08: qty 3, 1d supply, fill #0

## 2023-08-08 MED ORDER — DIPHENHYDRAMINE HCL 50 MG PO TABS: ORAL_TABLET | ORAL | 0 refills | Status: DC

## 2023-08-08 NOTE — Telephone Encounter (Signed)
Pt states she has a CT scheduled for Monday and needs the prednisone and benadryl premeds as she is allergic to the dye.

## 2023-08-09 ENCOUNTER — Other Ambulatory Visit: Payer: Self-pay | Admitting: Medical Oncology

## 2023-08-09 NOTE — Telephone Encounter (Signed)
Pt notified to pick up prednisone.

## 2023-08-11 NOTE — Progress Notes (Deleted)
  Cardiology Office Note:  .   Date:  08/11/2023  ID:  Heather [name], DOB 03-14-1971, MRN [id number] PCP: Mercie Eon, MD   [facility]Care Providers Cardiologist:  Elder Negus, MD  History of Present Illness: .   Heather [name] is a 52 y.o. female with a past medical history of lung cancer s/p right upper lobectomy, chest pain, COPD. Patient is followed by Dr. Rosemary Holms and presents today for an annual follow up appointment  Patient previously underwent nuclear stress test in 2014 that was a normal study. Underwent echocardiogram 05/20/21 that showed EF 67%, normal diastolic filling pattern. Aortic ultrasound 05/26/21 showed mild plaque in the mid and distal aorta with ectasia noted in the proximal and distal aorta measuring 2.4 cm, recommended repeat in 5 years. Lower arterial ultrasounds 05/26/21 showed no hemodynamically stenoses in the right or left lower arterial systems. Nuclear stress test from 07/25/21 was a normal, low risk study. EF 55-65%.   Chest pain  - Most recent stress test from 07/2021 as a normal low risk study with EF 55-65%   Abdominal Aortic Ectasia  - Aortic ultrasound 05/26/21 showed mild plaque in the mid and distal aorta with ectasia noted in the proximal and distal aorta measuring 2.4 cm. Recommended repeat scan in 5 years  - Anticipate follow up ultrasound in 2027   ROS: ***  Studies Reviewed: .        *** Risk Assessment/Calculations:   {Does this patient have ATRIAL FIBRILLATION?:[phone]} No BP recorded.  {Refresh Note OR Click here to enter BP  :1}***       Physical Exam:   VS:  There were no vitals taken for this visit.   Wt Readings from Last 3 Encounters:  05/30/23 109 lb 12.8 oz (49.8 kg)  04/04/23 105 lb 8 oz (47.9 kg)  11/07/22 109 lb 3.2 oz (49.5 kg)    GEN: Well nourished, well developed in no acute distress NECK: No JVD; No carotid bruits CARDIAC: ***RRR, no murmurs, rubs, gallops RESPIRATORY:  Clear to auscultation  without rales, wheezing or rhonchi  ABDOMEN: Soft, non-tender, non-distended EXTREMITIES:  No edema; No deformity   ASSESSMENT AND PLAN: .   ***    {Are you ordering a CV Procedure (e.g. stress test, cath, DCCV, TEE, etc)?   Press F2        :[id number]}  Dispo: ***  Signed, Jonita Albee, PA-C

## 2023-08-13 ENCOUNTER — Encounter (HOSPITAL_COMMUNITY): Payer: Self-pay

## 2023-08-13 ENCOUNTER — Ambulatory Visit (HOSPITAL_COMMUNITY)
Admission: RE | Admit: 2023-08-13 | Discharge: 2023-08-13 | Disposition: A | Discharge status: Home / Self Care | Payer: 59 | Source: Ambulatory Visit | Attending: Internal Medicine | Admitting: Internal Medicine

## 2023-08-13 ENCOUNTER — Inpatient Hospital Stay: Payer: 59 | Attending: Internal Medicine

## 2023-08-13 DIAGNOSIS — C3490 Malignant neoplasm of unspecified part of unspecified bronchus or lung: Secondary | ICD-10-CM | POA: Insufficient documentation

## 2023-08-13 DIAGNOSIS — F17210 Nicotine dependence, cigarettes, uncomplicated: Secondary | ICD-10-CM | POA: Insufficient documentation

## 2023-08-13 DIAGNOSIS — R202 Paresthesia of skin: Secondary | ICD-10-CM | POA: Insufficient documentation

## 2023-08-13 DIAGNOSIS — Z08 Encounter for follow-up examination after completed treatment for malignant neoplasm: Secondary | ICD-10-CM | POA: Insufficient documentation

## 2023-08-13 DIAGNOSIS — Z902 Acquired absence of lung [part of]: Secondary | ICD-10-CM | POA: Insufficient documentation

## 2023-08-13 DIAGNOSIS — Z85118 Personal history of other malignant neoplasm of bronchus and lung: Secondary | ICD-10-CM | POA: Insufficient documentation

## 2023-08-13 DIAGNOSIS — Z79899 Other long term (current) drug therapy: Secondary | ICD-10-CM | POA: Insufficient documentation

## 2023-08-13 DIAGNOSIS — R252 Cramp and spasm: Secondary | ICD-10-CM | POA: Insufficient documentation

## 2023-08-13 DIAGNOSIS — I700 Atherosclerosis of aorta: Secondary | ICD-10-CM | POA: Diagnosis not present

## 2023-08-13 DIAGNOSIS — R42 Dizziness and giddiness: Secondary | ICD-10-CM | POA: Insufficient documentation

## 2023-08-13 DIAGNOSIS — J439 Emphysema, unspecified: Secondary | ICD-10-CM | POA: Diagnosis not present

## 2023-08-13 DIAGNOSIS — R0609 Other forms of dyspnea: Secondary | ICD-10-CM | POA: Insufficient documentation

## 2023-08-13 LAB — CBC WITH DIFFERENTIAL (CANCER CENTER ONLY)
Abs Immature Granulocytes: 0.02 10*3/uL (ref 0.00–0.07)
Basophils Absolute: 0 10*3/uL (ref 0.0–0.1)
Basophils Relative: 1 %
Eosinophils Absolute: 0 10*3/uL (ref 0.0–0.5)
Eosinophils Relative: 0 %
HCT: 42.9 % (ref 36.0–46.0)
Hemoglobin: 14.1 g/dL (ref 12.0–15.0)
Immature Granulocytes: 0 %
Lymphocytes Relative: 6 %
Lymphs Abs: 0.4 10*3/uL — ABNORMAL LOW (ref 0.7–4.0)
MCH: 31.1 pg (ref 26.0–34.0)
MCHC: 32.9 g/dL (ref 30.0–36.0)
MCV: 94.7 fL (ref 80.0–100.0)
Monocytes Absolute: 0 10*3/uL — ABNORMAL LOW (ref 0.1–1.0)
Monocytes Relative: 0 %
Neutro Abs: 5.7 10*3/uL (ref 1.7–7.7)
Neutrophils Relative %: 93 %
Platelet Count: 143 10*3/uL — ABNORMAL LOW (ref 150–400)
RBC: 4.53 MIL/uL (ref 3.87–5.11)
RDW: 13.2 % (ref 11.5–15.5)
WBC Count: 6.1 10*3/uL (ref 4.0–10.5)
nRBC: 0 % (ref 0.0–0.2)

## 2023-08-13 LAB — CMP (CANCER CENTER ONLY)
ALT: 11 U/L (ref 0–44)
AST: 15 U/L (ref 15–41)
Albumin: 4.9 g/dL (ref 3.5–5.0)
Alkaline Phosphatase: 60 U/L (ref 38–126)
Anion gap: 11 (ref 5–15)
BUN: 9 mg/dL (ref 6–20)
CO2: 24 mmol/L (ref 22–32)
Calcium: 9.9 mg/dL (ref 8.9–10.3)
Chloride: 106 mmol/L (ref 98–111)
Creatinine: 0.74 mg/dL (ref 0.44–1.00)
GFR, Estimated: >60 mL/min (ref >60)
Glucose, Bld: 177 mg/dL — ABNORMAL HIGH (ref 70–99)
Potassium: 3.6 mmol/L (ref 3.5–5.1)
Sodium: 141 mmol/L (ref 135–145)
Total Bilirubin: 0.5 mg/dL (ref <1.2)
Total Protein: 7.6 g/dL (ref 6.5–8.1)

## 2023-08-13 MED ORDER — IOHEXOL 300 MG/ML  SOLN
75.0000 mL | Freq: Once | INTRAMUSCULAR | Status: AC | PRN
  Administered 2023-08-13: 75 mL via INTRAVENOUS

## 2023-08-14 ENCOUNTER — Ambulatory Visit: Payer: 59 | Admitting: Cardiology

## 2023-08-16 ENCOUNTER — Inpatient Hospital Stay: Payer: 59 | Admitting: Internal Medicine

## 2023-08-16 VITALS — BP 125/86 | HR 75 | Temp 98.0°F | Resp 16 | Ht 63.0 in | Wt 110.1 lb

## 2023-08-16 DIAGNOSIS — Z79899 Other long term (current) drug therapy: Secondary | ICD-10-CM | POA: Diagnosis not present

## 2023-08-16 DIAGNOSIS — Z85118 Personal history of other malignant neoplasm of bronchus and lung: Secondary | ICD-10-CM | POA: Diagnosis not present

## 2023-08-16 DIAGNOSIS — C3411 Malignant neoplasm of upper lobe, right bronchus or lung: Secondary | ICD-10-CM | POA: Diagnosis not present

## 2023-08-16 DIAGNOSIS — Z902 Acquired absence of lung [part of]: Secondary | ICD-10-CM | POA: Diagnosis not present

## 2023-08-16 DIAGNOSIS — R42 Dizziness and giddiness: Secondary | ICD-10-CM | POA: Diagnosis not present

## 2023-08-16 DIAGNOSIS — F17210 Nicotine dependence, cigarettes, uncomplicated: Secondary | ICD-10-CM | POA: Diagnosis not present

## 2023-08-16 DIAGNOSIS — R0609 Other forms of dyspnea: Secondary | ICD-10-CM | POA: Diagnosis not present

## 2023-08-16 DIAGNOSIS — Z08 Encounter for follow-up examination after completed treatment for malignant neoplasm: Secondary | ICD-10-CM | POA: Diagnosis not present

## 2023-08-16 DIAGNOSIS — R252 Cramp and spasm: Secondary | ICD-10-CM | POA: Diagnosis not present

## 2023-08-16 DIAGNOSIS — R202 Paresthesia of skin: Secondary | ICD-10-CM | POA: Diagnosis not present

## 2023-08-16 NOTE — Progress Notes (Signed)
[facility] [phone]   [phone]  OFFICE PROGRESS NOTE  Mercie Eon, MD [address] [place] [address]  DIAGNOSIS: Stage IA (T1a, N0, M0) moderately differentiated adenocarcinoma diagnosed in November 2018.  PRIOR THERAPY: Status post right upper lobectomy with mediastinal lymph node dissection under the care of Dr. Dorris Fetch on August 20, 2017.  CURRENT THERAPY: Observation.  INTERVAL HISTORY: Heather [name] 52 y.o. female returns to the clinic today for annual follow-up visit.Discussed the use of AI scribe software for clinical note transcription with the patient, who gave verbal consent to proceed.  History of Present Illness   The patient, a 52 year old individual with a history of stage 1A non-small cell lung adenocarcinoma, underwent a right upper lobectomy in November 2018. Since then, she has been under observation with annual follow-ups. Recently, she has been experiencing persistent shortness of breath and dizziness. She also reports fatigue, but no other new symptoms have been noted.  In addition to these symptoms, the patient has been experiencing numbness in her legs and frequent cramping. These symptoms have been reported to her primary care physician. Despite these symptoms, her vital signs, including blood pressure, oxygen saturation, and heart rate, remain stable.  The patient underwent a scan a few days prior to the consultation, the results of which are pending. She has been working in a hospital setting and continues to take her prescribed medications without any changes. She has not reported any changes in her condition or symptoms since her last consultation.       MEDICAL HISTORY: Past Medical History:  Diagnosis Date   Blood dyscrasia    COPD (chronic obstructive pulmonary disease) (HCC)    Current smoker    Difficult intubation    pt was told difficult intubation at [facility]   Factor V  deficiency (HCC)    protein deficiency   H/O drug rash due to contrast  05/14/2014   Headache    "weekly" (09/04/2017)   Heart murmur    Hypokalemia 10/15/2017   Lung adenocarcinoma dx'd 08/2017   s/p RU lobectomy   Pleuritic chest pain 06/13/2018   Pre-diabetes    Pulmonary nodule 07/16/2017   Rapid resting heart rate    happens infrequently   Rib pain on right side 12/21/2017    ALLERGIES:  is allergic to iohexol and aspirin.  MEDICATIONS:  Current Outpatient Medications  Medication Sig Dispense Refill   albuterol (PROVENTIL HFA) 108 (90 Base) MCG/ACT inhaler Inhale 2 puffs into the lungs every 6 (six) hours as needed for wheezing or shortness of breath. 8.5 g 2   Budeson-Glycopyrrol-Formoterol (BREZTRI AEROSPHERE) 160-9-4.8 MCG/ACT AERO Inhale 2 puffs into the lungs in the morning and at bedtime. 10.7 g 5   buPROPion (WELLBUTRIN XL) 150 MG 24 hr tablet Take 1 tablet (150 mg total) by mouth daily. 30 tablet 3   diphenhydrAMINE (BENADRYL) 50 MG tablet Take 1 tablet 1 hour prior to CT 1 tablet 0   mirtazapine (REMERON) 15 MG tablet Take 1 tablet (15 mg total) by mouth at bedtime. 90 tablet 3   nitroGLYCERIN (NITROSTAT) 0.4 MG SL tablet Place 1 tablet (0.4 mg total) under the tongue every 5 (five) minutes as needed for chest pain. 25 tablet 1   predniSONE (DELTASONE) 50 MG tablet Take 1 tablet 13 hours prior to CT, 7 hours prior to CT and 1 hour prior to CT 3 tablet 0   predniSONE (DELTASONE) 50 MG  tablet Take 1 tablet (50 mg total) by mouth 13 hours prior, 7 hours prior and 1 hour prior to scan. 3 tablet 0   No current facility-administered medications for this visit.    SURGICAL HISTORY:  Past Surgical History:  Procedure Laterality Date   BIOPSY  04/25/2021   Procedure: BIOPSY;  Surgeon: Sherrilyn Rist, MD;  Location: [facility];  Service: Gastroenterology;;  Egd and COLON   CESAREAN SECTION  1992; 2012   COLONOSCOPY N/A 05/15/2014   Procedure: COLONOSCOPY;  Surgeon:  Hart Carwin, MD;  Location: [facility];  Service: Endoscopy;  Laterality: N/A;   COLONOSCOPY WITH PROPOFOL N/A 04/25/2021   Procedure: COLONOSCOPY WITH PROPOFOL;  Surgeon: Sherrilyn Rist, MD;  Location: [facility];  Service: Gastroenterology;  Laterality: N/A;   ESOPHAGOGASTRODUODENOSCOPY N/A 05/15/2014   Procedure: ESOPHAGOGASTRODUODENOSCOPY (EGD);  Surgeon: Hart Carwin, MD;  Location: [facility];  Service: Endoscopy;  Laterality: N/A;   ESOPHAGOGASTRODUODENOSCOPY (EGD) WITH PROPOFOL N/A 04/25/2021   Procedure: ESOPHAGOGASTRODUODENOSCOPY (EGD) WITH PROPOFOL;  Surgeon: Sherrilyn Rist, MD;  Location: [facility];  Service: Gastroenterology;  Laterality: N/A;   LAPAROSCOPY  07/07/2011   Procedure: LAPAROSCOPY DIAGNOSTIC;  Surgeon: Robley Fries;  Location: [facility];  Service: Gynecology;  Laterality: N/A;   LESION REMOVAL  07/07/2011   Procedure: EXCISION VAGINAL LESION;  Surgeon: Robley Fries;  Location: [facility];  Service: Gynecology;  Laterality: N/A;   LOBECTOMY Right 08/20/2017   Procedure: RIGHT UPPER LOBE LOBECTOMY;  Surgeon: Loreli Slot, MD;  Location: [facility] OR;  Service: Thoracic;  Laterality: Right;   LYMPH NODE DISSECTION N/A 08/20/2017   Procedure: LYMPH NODE DISSECTION;  Surgeon: Loreli Slot, MD;  Location: [facility] OR;  Service: Thoracic;  Laterality: N/A;   POLYPECTOMY  04/25/2021   Procedure: POLYPECTOMY;  Surgeon: Sherrilyn Rist, MD;  Location: [facility];  Service: Gastroenterology;;   VAGINAL HYSTERECTOMY  ~ 2014   VIDEO ASSISTED THORACOSCOPY (VATS)/WEDGE RESECTION Right 08/20/2017   Procedure: RIGHT VIDEO ASSISTED THORACOSCOPY (VATS)/WEDGE RESECTION;  Surgeon: Loreli Slot, MD;  Location: [facility] OR;  Service: Thoracic;  Laterality: Right;    REVIEW OF SYSTEMS:  A comprehensive review of systems was negative.   PHYSICAL EXAMINATION: General appearance: alert, cooperative, and no distress Head: Normocephalic, without obvious abnormality,  atraumatic Neck: no adenopathy, no JVD, supple, symmetrical, trachea midline, and thyroid not enlarged, symmetric, no tenderness/mass/nodules Lymph nodes: Cervical, supraclavicular, and axillary nodes normal. Resp: clear to auscultation bilaterally Back: symmetric, no curvature. ROM normal. No CVA tenderness. Cardio: regular rate and rhythm, S1, S2 normal, no murmur, click, rub or gallop GI: soft, non-tender; bowel sounds normal; no masses,  no organomegaly Extremities: extremities normal, atraumatic, no cyanosis or edema  ECOG PERFORMANCE STATUS: 1 - Symptomatic but completely ambulatory  Blood pressure 125/86, pulse 75, temperature 98 F (36.7 C), temperature source Temporal, resp. rate 16, height 5\' 3"  (1.6 m), weight 110 lb 1.6 oz (49.9 kg), SpO2 100%.  LABORATORY DATA: Lab Results  Component Value Date   WBC 6.1 08/13/2023   HGB 14.1 08/13/2023   HCT 42.9 08/13/2023   MCV 94.7 08/13/2023   PLT 143 (L) 08/13/2023      Chemistry      Component Value Date/Time   NA 141 08/13/2023 1027   NA 142 05/30/2023 1027   NA 141 07/31/2017 1054   K 3.6 08/13/2023 1027   K 3.6 07/31/2017 1054   CL 106 08/13/2023 1027   CO2 24  08/13/2023 1027   CO2 23 07/31/2017 1054   BUN 9 08/13/2023 1027   BUN 9 05/30/2023 1027   BUN 5.9 (L) 07/31/2017 1054   CREATININE 0.74 08/13/2023 1027   CREATININE 0.8 07/31/2017 1054      Component Value Date/Time   CALCIUM 9.9 08/13/2023 1027   CALCIUM 9.9 07/31/2017 1054   ALKPHOS 60 08/13/2023 1027   ALKPHOS 50 07/31/2017 1054   AST 15 08/13/2023 1027   AST 12 07/31/2017 1054   ALT 11 08/13/2023 1027   ALT 9 07/31/2017 1054   BILITOT 0.5 08/13/2023 1027   BILITOT 0.69 07/31/2017 1054       RADIOGRAPHIC STUDIES: CT Chest W Contrast  Result Date: 08/16/2023 CLINICAL DATA:  Staging non-small-cell lung cancer. * Tracking Code: BO *. Prior surgery EXAM: CT CHEST WITH CONTRAST TECHNIQUE: Multidetector CT imaging of the chest was performed during  intravenous contrast administration. RADIATION DOSE REDUCTION: This exam was performed according to the departmental dose-optimization program which includes automated exposure control, adjustment of the mA and/or kV according to patient size and/or use of iterative reconstruction technique. CONTRAST:  75mL OMNIPAQUE IOHEXOL 300 MG/ML  SOLN COMPARISON:  Noncontrast CT 08/14/2022 FINDINGS: Cardiovascular: Heart is nonenlarged. No pericardial effusion. Coronary artery calcifications are seen. The thoracic aorta has a normal course and caliber with scattered partially calcified atherosclerotic plaque. Please correlate for other coronary risk factors. Mediastinum/Nodes: No specific abnormal lymph node enlargement identified in the axillary regions, hilum or mediastinum. Normal caliber thoracic esophagus. Preserved thyroid gland. Lungs/Pleura: Paraseptal and centrilobular emphysematous lung changes are identified. Some breathing motion. No pleural effusion or consolidation or pneumothorax. On the left there is some mild apical pleural thickening and scarring. No left-sided dominant nodule. Surgical changes on the right from lobectomy with scarring, fibrosis and pleural thickening along with the volume loss. Greatest pleural thickening of the right lung apex, unchanged from previous. No new dominant lung nodule. Upper Abdomen: No acute abnormality. Musculoskeletal: Mild degenerative changes. IMPRESSION: Stable surgical changes of right lobectomy. No developing new mass lesion, fluid collection or lymph node enlargement. Aortic Atherosclerosis (ICD10-I70.0) and Emphysema (ICD10-J43.9). Electronically Signed   By: Karen Kays M.D.   On: 08/16/2023 11:47     ASSESSMENT AND PLAN: This is a very pleasant 52 years old white female recently diagnosed with a stage IA non-small cell lung cancer, adenocarcinoma status post right upper lobectomy with lymph node dissection in November 2018 under the care of Dr. Dorris Fetch. She  has been on observation for the last 6 years. She had repeat CT scan of the chest performed recently.  I personally and independently reviewed the scan images and discussed the result with the patient today. Her scan showed no concerning findings for disease recurrence or metastasis.    Stage 1A Non-Small Cell Lung Cancer (Adenocarcinoma) Diagnosed in November 2018. Underwent right upper lobectomy by Dr. Dorris Fetch. On observation with annual follow-ups. Recent scan showed no concerning findings; awaiting official report. Reports persistent dyspnea, dizziness, and fatigue. Vital signs within normal limits. Discussed annual monitoring and imaging with family doctor, with option to return if symptoms arise. - Official scan report showed no evidence for disease recurrence. - Annual monitoring and imaging with family doctor - Return if concerning symptoms arise  Dyspnea Persistent since lobectomy. No acute changes. Likely related to previous lung surgery and current physical condition. - Monitor symptoms - Follow up with family doctor if symptoms worsen  Dizziness Recent onset. No acute findings. Could be related to post-surgical status  and general health. - Monitor symptoms - Discuss with family doctor if symptoms persist or worsen  Leg Cramps and Paresthesia Reports frequent leg cramps and numbness. No acute findings on examination. Symptoms discussed with primary care physician. - Monitor symptoms - Follow up with primary care physician for further evaluation and management  General Health Maintenance Labs generally stable with occasional low readings. No significant abnormalities noted. - Continue regular health check-ups with family doctor - Monitor lab results and address abnormalities with primary care physician.   The patient was advised to call immediately if she has any concerning symptoms. The patient voices understanding of current disease status and treatment options and is in  agreement with the current care plan. All questions were answered. The patient knows to call the clinic with any problems, questions or concerns. We can certainly see the patient much sooner if necessary.  The total time spent in the appointment was 20 minutes.  Disclaimer: This note was dictated with voice recognition software. Similar sounding words can inadvertently be transcribed and may not be corrected upon review.

## 2023-08-17 NOTE — Progress Notes (Unsigned)
Cardiology Clinic Note   Patient Name: Heather [name] Date of Encounter: 08/21/2023  Primary Care Provider:  Mercie Eon, MD Primary Cardiologist:  Elder Negus, MD  Patient Profile    Heather [name] 52 year old female presents the clinic today for follow-up evaluation of her chest discomfort.  Past Medical History    Past Medical History:  Diagnosis Date   Blood dyscrasia    COPD (chronic obstructive pulmonary disease) (HCC)    Current smoker    Difficult intubation    pt was told difficult intubation at [facility]   Factor V deficiency (HCC)    protein deficiency   H/O drug rash due to contrast  05/14/2014   Headache    "weekly" (09/04/2017)   Heart murmur    Hypokalemia 10/15/2017   Lung adenocarcinoma dx'd 08/2017   s/p RU lobectomy   Pleuritic chest pain 06/13/2018   Pre-diabetes    Pulmonary nodule 07/16/2017   Rapid resting heart rate    happens infrequently   Rib pain on right side 12/21/2017   Past Surgical History:  Procedure Laterality Date   BIOPSY  04/25/2021   Procedure: BIOPSY;  Surgeon: Sherrilyn Rist, MD;  Location: [facility];  Service: Gastroenterology;;  Egd and COLON   CESAREAN SECTION  1992; 2012   COLONOSCOPY N/A 05/15/2014   Procedure: COLONOSCOPY;  Surgeon: Hart Carwin, MD;  Location: [facility];  Service: Endoscopy;  Laterality: N/A;   COLONOSCOPY WITH PROPOFOL N/A 04/25/2021   Procedure: COLONOSCOPY WITH PROPOFOL;  Surgeon: Sherrilyn Rist, MD;  Location: [facility];  Service: Gastroenterology;  Laterality: N/A;   ESOPHAGOGASTRODUODENOSCOPY N/A 05/15/2014   Procedure: ESOPHAGOGASTRODUODENOSCOPY (EGD);  Surgeon: Hart Carwin, MD;  Location: [facility];  Service: Endoscopy;  Laterality: N/A;   ESOPHAGOGASTRODUODENOSCOPY (EGD) WITH PROPOFOL N/A 04/25/2021   Procedure: ESOPHAGOGASTRODUODENOSCOPY (EGD) WITH PROPOFOL;  Surgeon: Sherrilyn Rist, MD;  Location: [facility];  Service: Gastroenterology;   Laterality: N/A;   LAPAROSCOPY  07/07/2011   Procedure: LAPAROSCOPY DIAGNOSTIC;  Surgeon: Robley Fries;  Location: [facility];  Service: Gynecology;  Laterality: N/A;   LESION REMOVAL  07/07/2011   Procedure: EXCISION VAGINAL LESION;  Surgeon: Robley Fries;  Location: [facility];  Service: Gynecology;  Laterality: N/A;   LOBECTOMY Right 08/20/2017   Procedure: RIGHT UPPER LOBE LOBECTOMY;  Surgeon: Loreli Slot, MD;  Location: [facility] OR;  Service: Thoracic;  Laterality: Right;   LYMPH NODE DISSECTION N/A 08/20/2017   Procedure: LYMPH NODE DISSECTION;  Surgeon: Loreli Slot, MD;  Location: [facility] OR;  Service: Thoracic;  Laterality: N/A;   POLYPECTOMY  04/25/2021   Procedure: POLYPECTOMY;  Surgeon: Sherrilyn Rist, MD;  Location: [facility];  Service: Gastroenterology;;   VAGINAL HYSTERECTOMY  ~ 2014   VIDEO ASSISTED THORACOSCOPY (VATS)/WEDGE RESECTION Right 08/20/2017   Procedure: RIGHT VIDEO ASSISTED THORACOSCOPY (VATS)/WEDGE RESECTION;  Surgeon: Loreli Slot, MD;  Location: [facility] OR;  Service: Thoracic;  Laterality: Right;    Allergies  Allergies  Allergen Reactions   Iohexol Shortness Of Breath, Swelling and Rash    Patient indicates she had swelling all over her body as well as shortness of breath.  Even when she was premedicated, she broke out in a full body rash.   Aspirin Nausea And Vomiting    History of Present Illness    Heather [name] is a PMH of precordial chest pain, adenocarcinoma right upper lobe status post lobectomy 11/18, anemia, and chronic  weight loss.  She is a patient of Dr. Rosemary Holms.  She was evaluated 10/22 for chest pain.  At that time her echocardiogram was normal and showed mild tricuspid regurgitation.  Her abdominal aortic duplex showed mild plaque with mild distal aorta and mild ectasis in proximal and distal aorta measuring 2.4 cm.  Her lower extremity arterial's showed multiphasic waveforms in both ankles suggestive of no significant  PAD.  Her stress test was low risk.  She was told to follow-up as needed.  She followed up 02/03/2022.  During that time she reported worsening chest pain over the last several months.  She reported pain in her sternum and worsening with certain movements and exertion.  She describes her pain as pleuritic.  She denied palpitations, near-syncope and syncope.  She did note that her episodes coincided with shortness of breath.  She had reduced her smoking and was smoking about 2-3 cigarettes/week.  Her EKG showed sinus rhythm with a rate of 86 bpm.  She was noted to have nonspecific T waves and no evidence of ischemia.  High-sensitivity troponin was ordered and was noted to be 7.  Her discomfort was felt to be related to musculoskeletal versus precordial type pain.  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  She notices continuous chest discomfort.  We reviewed her lung cancer and lobectomy.  We reviewed her stress testing and echocardiogram.  She expressed understanding.  We reviewed her previous cardiology visit.  Her blood pressure is well-controlled today at 100/60 and her pulse was noted to be 84.  We reviewed her recent blood work and most recent EKG.  I recommended arch supports for her foot cramping/pain and lower extremity support stockings.  It appears that her chest discomfort is related to her lobectomy and previous lung cancer.  I will plan follow-up in 12 months.    Home Medications    Prior to Admission medications   Medication Sig Start Date End Date Taking? Authorizing Provider  albuterol (PROVENTIL HFA) 108 (90 Base) MCG/ACT inhaler Inhale 2 puffs into the lungs every 6 (six) hours as needed for wheezing or shortness of breath. 10/24/22   Piontek, Denny Peon, MD  Budeson-Glycopyrrol-Formoterol (BREZTRI AEROSPHERE) 160-9-4.8 MCG/ACT AERO Inhale 2 puffs into the lungs in the morning and at  bedtime. 11/07/22   Lupita Leash, MD  buPROPion (WELLBUTRIN XL) 150 MG 24 hr tablet Take 1 tablet (150 mg total) by mouth daily. 05/30/23 09/27/23  Mercie Eon, MD  diphenhydrAMINE (BENADRYL) 50 MG tablet Take 1 tablet 1 hour prior to CT 08/08/23   Si Gaul, MD  mirtazapine (REMERON) 15 MG tablet Take 1 tablet (15 mg total) by mouth at bedtime. 04/04/23 04/03/24  Masters, Katie, DO  nitroGLYCERIN (NITROSTAT) 0.4 MG SL tablet Place 1 tablet (0.4 mg total) under the tongue every 5 (five) minutes as needed for chest pain. 05/12/21   Patwardhan, Anabel Bene, MD  predniSONE (DELTASONE) 50 MG tablet Take 1 tablet 13 hours prior to CT, 7 hours prior to CT and 1 hour prior to CT 08/08/23   Si Gaul, MD  predniSONE (DELTASONE) 50 MG tablet Take 1 tablet (50 mg total) by mouth 13 hours prior, 7 hours prior and 1 hour prior to scan. 08/08/23   Si Gaul, MD    Family History    Family History  Problem Relation Age of Onset   Heart disease Mother    Heart disease  Father    Colon polyps Father    Breast cancer Sister 76   Colon cancer Paternal Uncle    Colon polyps Paternal Uncle    Stomach cancer Maternal Grandmother    She indicated that her mother is deceased. She indicated that her father is deceased. She indicated that only one of her two sisters is alive. She indicated that the status of her maternal grandmother is unknown. She indicated that the status of her paternal uncle is unknown.  Social History    Social History   Socioeconomic History   Marital status: Divorced    Spouse name: Not on file   Number of children: 2   Years of education: Not on file   Highest education level: Not on file  Occupational History   Not on file  Tobacco Use   Smoking status: Some Days    Current packs/day: 2.00    Average packs/day: 2.0 packs/day for 40.9 years (81.8 ttl pk-yrs)    Types: Cigarettes    Start date: 1984   Smokeless tobacco: Never   Tobacco comments:    1-3  cigarette per week currently  Vaping Use   Vaping status: Never Used  Substance and Sexual Activity   Alcohol use: Not Currently    Alcohol/week: 0.0 standard drinks of alcohol    Comment: 09/04/2017 "a mixed drink a few times/year"   Drug use: No   Sexual activity: Not Currently  Other Topics Concern   Not on file  Social History Narrative   Not on file   Social Determinants of Health   Financial Resource Strain: Medium Risk (09/04/2017)   Overall Financial Resource Strain (CARDIA)    Difficulty of Paying Living Expenses: Somewhat hard  Food Insecurity: No Food Insecurity (09/04/2017)   Hunger Vital Sign    Worried About Running Out of Food in the Last Year: Never true    Ran Out of Food in the Last Year: Never true  Transportation Needs: No Transportation Needs (09/04/2017)   PRAPARE - Administrator, Civil Service (Medical): No    Lack of Transportation (Non-Medical): No  Physical Activity: Sufficiently Active (09/04/2017)   Exercise Vital Sign    Days of Exercise per Week: 3 days    Minutes of Exercise per Session: 60 min  Stress: Stress Concern Present (09/04/2017)   [employer] of Occupational Health - Occupational Stress Questionnaire    Feeling of Stress : Very much  Social Connections: Moderately Isolated (09/04/2017)   Social Connection and Isolation Panel [NHANES]    Frequency of Communication with Friends and Family: Once a week    Frequency of Social Gatherings with Friends and Family: More than three times a week    Attends Religious Services: Never    Database administrator or Organizations: No    Attends Banker Meetings: Never    Marital Status: Divorced  Catering manager Violence: Not At Risk (09/04/2017)   Humiliation, Afraid, Rape, and Kick questionnaire    Fear of Current or Ex-Partner: No    Emotionally Abused: No    Physically Abused: No    Sexually Abused: No     Review of Systems    General:  No chills, fever, night  sweats or weight changes.  Cardiovascular:  No chest pain, dyspnea on exertion, edema, orthopnea, palpitations, paroxysmal nocturnal dyspnea. Dermatological: No rash, lesions/masses Respiratory: No cough, dyspnea Urologic: No hematuria, dysuria Abdominal:   No nausea, vomiting, diarrhea, bright red blood per rectum,  melena, or hematemesis Neurologic:  No visual changes, wkns, changes in mental status. All other systems reviewed and are otherwise negative except as noted above.  Physical Exam    VS:  BP 100/60 (BP Location: Left Arm, Patient Position: Sitting, Cuff Size: Normal)   Pulse 84   Ht 5\' 3"  (1.6 m)   Wt 108 lb (49 kg)   BMI 19.13 kg/m  , BMI Body mass index is 19.13 kg/m. GEN: Well nourished, well developed, in no acute distress. HEENT: normal. Neck: Supple, no JVD, carotid bruits, or masses. Cardiac: RRR, no murmurs, rubs, or gallops. No clubbing, cyanosis, edema.  Radials/DP/PT 2+ and equal bilaterally.  Respiratory:  Respirations regular and unlabored, clear to auscultation bilaterally. GI: Soft, nontender, nondistended, BS + x 4. MS: no deformity or atrophy. Skin: warm and dry, no rash. Neuro:  Strength and sensation are intact. Psych: Normal affect.  Accessory Clinical Findings    Recent Labs: 05/30/2023: Magnesium 2.2 08/13/2023: ALT 11; BUN 9; Creatinine 0.74; Hemoglobin 14.1; Platelet Count 143; Potassium 3.6; Sodium 141   Recent Lipid Panel    Component Value Date/Time   CHOL 173 02/15/2021 1001   TRIG 52 02/15/2021 1001   HDL 71 02/15/2021 1001   CHOLHDL 2.4 02/15/2021 1001   CHOLHDL 2.7 04/02/2014 0932   VLDL 10 04/02/2014 0932   LDLCALC 92 02/15/2021 1001         ECG personally reviewed by me today-none today.     Nuclear stress test 07/25/2021     The study is normal. The study is low risk.   LV perfusion is normal.   Left ventricular function is normal. The left ventricular ejection fraction is normal (55-65%). End diastolic cavity size  is normal.   Prior study not available for comparison.   Lexiscan (with Mod Bruce protocol) Nuclear stress test 07/25/2021: Nondiagnostic ECG stress due to pharmacologic stress. Stress symptoms included non limiting chest pain and dyspnea. Additionally, patient exercised on a Modified Bruce protocol achieving 2.27 METS. Overall LV systolic function is normal without regional wall motion abnormalities. Myocardial perfusion is normal. Stress LV EF: 67%. No previous exam available for comparison. Low risk study.   Abdominal Aortic Duplex 05/26/2021:  Mild plaque noted in the mid and distal aorta. There is ectasia noted in  the proximal and distal aorta measuring approximately 2.4 cm. Recommend re  evaluation in 5 years for stability.  Normal iliac artery velocity.   LEAs No hemodynamically significant stenoses are identified in the right lower  extremity arterial system. No hemodynamically significant stenoses are  identified in the left lower extremity arterial system.  This exam reveals normal perfusion of the right and left  lower extremity  (ABI 1.00) with multiphasic waveforms at both ankles. Evaluate for pseudo  claudication.   Echocardiogram 05/20/2021:  Left ventricle cavity is normal in size and wall thickness. Normal global  wall motion. Normal LV systolic function with EF 67%. Normal diastolic  filling pattern.  Mild tricuspid regurgitation.  No evidence of pulmonary hypertension.  Assessment & Plan   1.  Chest discomfort-stable.  Chronic.  Underwent stress testing which showed low risk and normal perfusion 10/22. Normal Echo in 8/22.  Physically active working in short stay.  Walks regularly throughout the day.  Appears that her chest discomfort is pleuritic in nature. Heart healthy low-sodium diet Increase physical activity as tolerated-goal 150 minutes of moderate physical activity per week No plans for ischemic evaluation at this time. Reassured that her discomfort was  not cardiac related  Abdominal aortic aneurysm-denies episodes of back and chest discomfort.  Blood pressure well-controlled.  Noted to have minimal dilation of her aorta aortic duplex 05/26/2021.  (2.4 cm) Plan for repeat abdominal aortic duplex around 8/27 Maintain good blood pressure control  Hx of lung Ca-Notes pleuritic type chest pain. Follow up with pulm on 5th.  Foot pain, lower extremity discomfort-2+ dorsal pedis and posterior tibial pulses.  If she eats for lower extremity support stockings and instructed to get arch supports for her shoes.   Disposition: Follow-up with Dr.Patwardhan or me in 12 months.   Thomasene Ripple. Reason Helzer NP-C     08/21/2023, 3:18 PM [facility] [address] Office [phone] Fax [phone]    I spent 14 minutes examining this patient, reviewing medications, and using patient centered shared decision making involving her cardiac care.   I spent greater than 20 minutes reviewing her past medical history,  medications, and prior cardiac tests.

## 2023-08-21 ENCOUNTER — Ambulatory Visit: Payer: 59 | Attending: General Practice | Admitting: General Practice

## 2023-08-21 ENCOUNTER — Encounter: Payer: Self-pay | Admitting: General Practice

## 2023-08-21 VITALS — BP 100/60 | HR 84 | Ht 63.0 in | Wt 108.0 lb

## 2023-08-21 DIAGNOSIS — R072 Precordial pain: Secondary | ICD-10-CM | POA: Diagnosis not present

## 2023-08-21 DIAGNOSIS — Z8249 Family history of ischemic heart disease and other diseases of the circulatory system: Secondary | ICD-10-CM

## 2023-08-21 NOTE — Patient Instructions (Signed)
Medication Instructions:  No changes    *If you need a refill on your cardiac medications before your next appointment, please call your pharmacy*   Lab Work:    If you have labs (blood work) drawn today and your tests are completely normal, you will receive your results only by: MyChart Message (if you have MyChart) OR A paper copy in the mail If you have any lab test that is abnormal or we need to change your treatment, we will call you to review the results.   Testing/Procedures: None    Follow-Up: At [facility], you and your health needs are our priority.  As part of our continuing mission to provide you with exceptional heart care, we have created designated Provider Care Teams.  These Care Teams include your primary Cardiologist (physician) and Advanced Practice Providers (APPs -  Physician Assistants and Nurse Practitioners) who all work together to provide you with the care you need, when you need it.  We recommend signing up for the patient portal called "MyChart".  Sign up information is provided on this After Visit Summary.  MyChart is used to connect with patients for Virtual Visits (Telemedicine).  Patients are able to view lab/test results, encounter notes, upcoming appointments, etc.  Non-urgent messages can be sent to your provider as well.   To learn more about what you can do with MyChart, go to [email or web address].    Your next appointment:   1 year(s)  The format for your next appointment:   In Person  Provider:   Edd Fabian, FNP      Other Instructions  1-800-QUIT-NOW [phone])   If you interested in 1-1 telephonic tobacco cessation coaching with [employer], please call [phone] to enroll today.   Steps to Quit Smoking Smoking tobacco is the leading cause of preventable death. It can affect almost every organ in the body. Smoking puts you and people around you at risk for many serious, long-lasting (chronic)  diseases. Quitting smoking can be hard, but it is one of the best things that you can do for your health. It is never too late to quit. Do not give up if you cannot quit the first time. Some people need to try many times to quit. Do your best to stick to your quit plan, and talk with your doctor if you have any questions or concerns. How do I get ready to quit? Pick a date to quit. Set a date within the next 2 weeks to give you time to prepare. Write down the reasons why you are quitting. Keep this list in places where you will see it often. Tell your family, friends, and co-workers that you are quitting. Their support is important. Talk with your doctor about the choices that may help you quit. Find out if your health insurance will pay for these treatments. Know the people, places, things, and activities that make you want to smoke (triggers). Avoid them. What first steps can I take to quit smoking? Throw away all cigarettes at home, at work, and in your car. Throw away the things that you use when you smoke, such as ashtrays and lighters. Clean your car. Empty the ashtray. Clean your home, including curtains and carpets. What can I do to help me quit smoking? Talk with your doctor about taking medicines and seeing a counselor. You are more likely to succeed when you do both. If you are pregnant or breastfeeding: Talk with your doctor about counseling or other ways to  quit smoking. Do not take medicine to help you quit smoking unless your doctor tells you to. Quit right away Quit smoking completely, instead of slowly cutting back on how much you smoke over a period of time. Stopping smoking right away may be more successful than slowly quitting. Go to counseling. In-person is best if this is an option. You are more likely to quit if you go to counseling sessions regularly. Take medicine You may take medicines to help you quit. Some medicines need a prescription, and some you can buy  over-the-counter. Some medicines may contain a drug called nicotine to replace the nicotine in cigarettes. Medicines may: Help you stop having the desire to smoke (cravings). Help to stop the problems that come when you stop smoking (withdrawal symptoms). Your doctor may ask you to use: Nicotine patches, gum, or lozenges. Nicotine inhalers or sprays. Non-nicotine medicine that you take by mouth. Find resources Find resources and other ways to help you quit smoking and remain smoke-free after you quit. They include: Online chats with a Veterinary surgeon. Phone quitlines. Printed Materials engineer. Support groups or group counseling. Text messaging programs. Mobile phone apps. Use apps on your mobile phone or tablet that can help you stick to your quit plan. Examples of free services include Quit Guide from the CDC and smokefree.gov  What can I do to make it easier to quit?  Talk to your family and friends. Ask them to support and encourage you. Call a phone quitline, such as 1-800-QUIT-NOW, reach out to support groups, or work with a Veterinary surgeon. Ask people who smoke to not smoke around you. Avoid places that make you want to smoke, such as: Bars. Parties. Smoke-break areas at work. Spend time with people who do not smoke. Lower the stress in your life. Stress can make you want to smoke. Try these things to lower stress: Getting regular exercise. Doing deep-breathing exercises. Doing yoga. Meditating. What benefits will I see if I quit smoking? Over time, you may have: A better sense of smell and taste. Less coughing and sore throat. A slower heart rate. Lower blood pressure. Clearer skin. Better breathing. Fewer sick days. Summary Quitting smoking can be hard, but it is one of the best things that you can do for your health. Do not give up if you cannot quit the first time. Some people need to try many times to quit. When you decide to quit smoking, make a plan to help you  succeed. Quit smoking right away, not slowly over a period of time. When you start quitting, get help and support to keep you smoke-free. This information is not intended to replace advice given to you by your health care provider. Make sure you discuss any questions you have with your health care provider. Document Revised: 09/09/2021 Document Reviewed: 09/09/2021 Elsevier Patient Education  2023 Elsevier Inc.   Edd Fabian recommends HIGH COMPRESSION STOCKINGS  -- [facility]  -- [address] [place]  -- [phone]  -- [employer]   -- [address] [place]   -- [phone] -- [facility]  -- [address] [place]  -- [phone]   How to Use Compression Stockings Compression stockings are elastic socks that squeeze the legs. They help to increase blood flow to the legs, decrease swelling in the legs, and reduce the chance of developing blood clots in the lower legs. Compression stockings are often used by people who: Are recovering from surgery. Have poor circulation in  their legs. Are prone to getting blood clots in their legs. Have varicose veins. Sit or stay in bed for long periods of time. How to use compression stockings Before you put on your compression stockings: Make sure that they are the correct size. If you do not know your size, ask your health care provider. Make sure that they are clean, dry, and in good condition. Check them for rips and tears. Do not put them on if they are ripped or torn. Put your stockings on first thing in the morning, before you get out of bed. Keep them on for as long as your health care provider advises. When you are wearing your stockings: Keep them as smooth as possible. Do not allow them to bunch up. It is especially important to prevent the stockings from bunching up around your toes or behind your knees. Do not roll the stockings downward and leave them  rolled down. This can decrease blood flow to your leg. Change them right away if they become wet or dirty. When you take off your stockings, inspect your legs and feet. Anything that does not seem normal may require medical attention. Look for: Open sores. Red spots. Swelling. Information and tips Do not stop wearing your compression stockings without talking to your health care provider first. Wash your stockings every day with mild detergent in cold or warm water. Do not use bleach. Air-dry your stockings or dry them in a clothes dryer on low heat. Replace your stockings every 3-6 months. If skin moisturizing is part of your treatment plan, apply lotion or cream at night so that your skin will be dry when you put on the stockings in the morning. It is harder to put the stockings on when you have lotion on your legs or feet. Contact a health care provider if: Remove your stockings and seek medical care if: You have a feeling of pins and needles in your feet or legs. You have any new changes in your skin. You have skin lesions that are getting worse. You have swelling or pain that is getting worse. Get help right away if: You have numbness or tingling in your lower legs that does not get better right after you take the stockings off. Your toes or feet become cold and blue. You develop open sores or red spots on your legs that do not go away. You see or feel a warm spot on your leg. You have new swelling or soreness in your leg. You are short of breath or you have chest pain for no reason. You have a rapid or irregular heartbeat. You feel light-headed or dizzy. This information is not intended to replace advice given to you by your health care provider. Make sure you discuss any questions you have with your health care provider. Document Released: 07/16/2009 Document Revised: 02/16/2016 Document Reviewed: 08/26/2014 Elsevier Interactive Patient Education  2017 Elsevier Inc.    Edd Fabian recommends taking a daily multivitamin. These can be found at [facility], [facility] or any local drugstore.

## 2023-09-06 ENCOUNTER — Ambulatory Visit: Payer: 59 | Admitting: Adult Health

## 2023-09-14 ENCOUNTER — Other Ambulatory Visit (HOSPITAL_COMMUNITY): Payer: Self-pay

## 2023-10-15 ENCOUNTER — Ambulatory Visit: Payer: 59 | Admitting: Adult Health

## 2023-10-15 ENCOUNTER — Encounter: Payer: Self-pay | Admitting: Adult Health

## 2023-11-06 ENCOUNTER — Other Ambulatory Visit: Payer: Self-pay

## 2023-11-06 ENCOUNTER — Other Ambulatory Visit (HOSPITAL_COMMUNITY): Payer: Self-pay

## 2023-11-06 ENCOUNTER — Encounter (HOSPITAL_COMMUNITY): Payer: Self-pay | Admitting: Pharmacist

## 2023-11-16 ENCOUNTER — Other Ambulatory Visit (HOSPITAL_COMMUNITY): Payer: Self-pay

## 2024-01-29 ENCOUNTER — Other Ambulatory Visit (HOSPITAL_COMMUNITY): Payer: Self-pay

## 2024-01-29 ENCOUNTER — Encounter: Payer: Self-pay | Admitting: Internal Medicine

## 2024-01-29 ENCOUNTER — Other Ambulatory Visit: Payer: Self-pay

## 2024-01-29 ENCOUNTER — Ambulatory Visit: Admitting: Student

## 2024-01-29 VITALS — BP 126/76 | HR 83 | Temp 98.1°F | Ht 63.0 in | Wt 108.3 lb

## 2024-01-29 DIAGNOSIS — J432 Centrilobular emphysema: Secondary | ICD-10-CM

## 2024-01-29 DIAGNOSIS — F331 Major depressive disorder, recurrent, moderate: Secondary | ICD-10-CM | POA: Diagnosis not present

## 2024-01-29 DIAGNOSIS — R519 Headache, unspecified: Secondary | ICD-10-CM | POA: Diagnosis not present

## 2024-01-29 DIAGNOSIS — F32A Depression, unspecified: Secondary | ICD-10-CM

## 2024-01-29 DIAGNOSIS — J439 Emphysema, unspecified: Secondary | ICD-10-CM

## 2024-01-29 MED ORDER — MIRTAZAPINE 15 MG PO TABS
15.0000 mg | ORAL_TABLET | Freq: Every day | ORAL | 5 refills | Status: DC
  Filled 2024-01-29 – 2024-03-19 (×2): qty 30, 30d supply, fill #0
  Filled 2024-04-15: qty 30, 30d supply, fill #1
  Filled 2024-05-15: qty 30, 30d supply, fill #2
  Filled 2024-06-16: qty 30, 30d supply, fill #3

## 2024-01-29 MED ORDER — BREZTRI AEROSPHERE 160-9-4.8 MCG/ACT IN AERO
2.0000 | INHALATION_SPRAY | Freq: Two times a day (BID) | RESPIRATORY_TRACT | 5 refills | Status: AC
  Filled 2024-01-29: qty 10.7, 30d supply, fill #0
  Filled 2024-02-19 – 2024-02-22 (×2): qty 10.7, 30d supply, fill #1
  Filled 2024-03-24: qty 10.7, 30d supply, fill #2
  Filled 2024-05-12: qty 10.7, 30d supply, fill #3
  Filled 2024-06-10: qty 10.7, 30d supply, fill #4
  Filled 2024-07-17: qty 10.7, 30d supply, fill #5

## 2024-01-29 MED ORDER — BUPROPION HCL ER (XL) 300 MG PO TB24
300.0000 mg | ORAL_TABLET | ORAL | 5 refills | Status: DC
  Filled 2024-01-29: qty 30, 30d supply, fill #0
  Filled 2024-02-19 – 2024-02-22 (×2): qty 30, 30d supply, fill #1
  Filled 2024-03-24: qty 30, 30d supply, fill #2
  Filled 2024-05-12: qty 30, 30d supply, fill #3
  Filled 2024-06-10: qty 30, 30d supply, fill #4
  Filled 2024-07-17: qty 30, 30d supply, fill #5

## 2024-01-29 MED ORDER — SUMATRIPTAN SUCCINATE 25 MG PO TABS
25.0000 mg | ORAL_TABLET | Freq: Once | ORAL | 1 refills | Status: DC | PRN
  Filled 2024-01-29: qty 12, 30d supply, fill #0

## 2024-01-29 NOTE — Assessment & Plan Note (Addendum)
 Patient noted to have a chronic cough due to COPD. Also has a history of adenocarcinoma of upper right lobe. Currently smoking about 5 cigarettes a week, smokes more often when she is stressed. Last COPD exacerbation was within the last year. Was following with pulmonology but Dr. Elverna Hamman left practice, has not followed up for more than a year. Dr. McQuaid had replaced the patient's albuterol  PRN inhaler for Breztri  for maintenance but patient has not used this inhaler for about a year.   Current cough does not seem to be acute in nature, seems to be part of an overall chronic process. Patient denies recent URI or congestion symptoms. Does endorse feeling subjectively short of breath, especially when talking or with activity. Does not have a working pulse ox at home. She states her temperature has gotten up to 100.7 F and she has felt hot. States her sputum is between a light yellow and a white color. Patient is not in acute distress, afebrile and saturating 100% on room air. Pulmonary exam today unremarkable.   Discussed plan to restart Breztri  maintenance inhaler and pulse ox to help determine if her O2 saturation is impacted by prolonged activity. Patient agreeable with plan, will call clinic if she needs to be re-evaluated or if there are concerns for COPD exacerbation requiring acute treatment.  Plan - RESTART Breztri  daily inhaler - Smoking cessation counseling provided

## 2024-01-29 NOTE — Assessment & Plan Note (Signed)
 Patient has complained of headache with associated visual aura since 2021. Today her headache is focal near right temple, has visual disturbances, and is experiencing some nausea. Feels the headache coming on, can last for more than a day. Symptoms consistent with migraine. MRI from 2021 unremarkable. Previously told to start with Tylenol  and NSAIDs for abortive therapy. Patient has been taking Tylenol  200 mg as needed with minimal to no relief. Patient was also advised headache may be worsened by insomnia and stress, which patient does experience. Discussed alternative options including sumatriptan therapy and abortive therapy with Ubrelvy or Nurtec. Reviewed precautions and risks of serotonin syndrome given other medications she is taking. Patient acknowledged understanding, provided with patient handout.  Plan - START sumatriptan 25 mg for migraine, can repeat dose 2 hours later, not to exceed two doses in 24 hours - Patient given Nurtec sample, told to take 1 dose, can repeat 24 hours later, if patient likes this medication we can consider prescribing at follow up visit in 1 month  - Handout on serotonin syndrome provided - Patient to continue Tylenol  therapy as needed if helpful

## 2024-01-29 NOTE — Patient Instructions (Signed)
 Thank you, [name] for allowing us  to provide your care today. Today we discussed your mood, cough, and headache.   I have ordered the following labs for you:  Lab Orders  No laboratory test(s) ordered today     Tests ordered today:  None  Referrals ordered today:   Referral Orders  No referral(s) requested today     I have ordered the following medication/changed the following medications:   Stop the following medications: Medications Discontinued During This Encounter  Medication Reason   buPROPion  (WELLBUTRIN  XL) 150 MG 24 hr tablet Dose change   albuterol  (PROVENTIL  HFA) 108 (90 Base) MCG/ACT inhaler    Budeson-Glycopyrrol-Formoterol  (BREZTRI  AEROSPHERE) 160-9-4.8 MCG/ACT AERO Reorder     Start the following medications: Meds ordered this encounter  Medications   buPROPion  (WELLBUTRIN  XL) 300 MG 24 hr tablet    Sig: Take 1 tablet (300 mg total) by mouth every morning.    Dispense:  30 tablet    Refill:  5   budeson-glycopyrrolate -formoterol  (BREZTRI  AEROSPHERE) 160-9-4.8 MCG/ACT AERO inhaler    Sig: Inhale 2 puffs into the lungs in the morning and at bedtime.    Dispense:  10.7 g    Refill:  5   SUMAtriptan (IMITREX) 25 MG tablet    Sig: Take 1 tablet (25 mg total) by mouth once as needed for up to 1 dose for migraine. May repeat in 2 hours if headache persists or recurs. Do NOT exceed more than 2 doses in 24 hours.    Dispense:  12 tablet    Refill:  1     Follow up: 1 month for migraine, depression, and COPD   Remember:   FOR migraine:  - Continue to take Tylenol  as needed - When you sense a migraine coming on, take ONE 25 mg tablet of sumatriptan. Wait two hours, if migraine persists take one additional tablet. DO NOT exceed more than 2 in 24 hours.  - Please refer to handout for information on serotonin syndrome, which is a medical emergency.  - You have been given a sample of NURTEC, which you can try during your current or next migraine. You  can take ONE tablet for migraine, wait 24 hours before repeating a dose.   FOR mood/depression:  - START taking Wellbutrin  300 mg daily, do not stop taking suddenly. Please call our clinic if you develop lightheadedness, confusion, or nausea/vomiting.   FOR cough/COPD:  - START taking Breztri  inhaler - Call our clinic back if you develop fever or if your pulse ox device reads low 90s-high 80s consistently  Should you have any questions or concerns please call the internal medicine clinic at [phone].     Veniamin Kincaid Arellano Zameza, MD PGY-1 Internal Medicine Teaching [facility]

## 2024-01-29 NOTE — Progress Notes (Signed)
 Acute Office Visit  Subjective:     Patient ID: Heather [name], female    DOB: [birth year], 53 y.o.   MRN: [id number]  Chief Complaint  Patient presents with   Shortness of Breath   Cough   Headache   Patient is a 53 y.o. with a past medical history stated below who presents today for persistent cough, headache, and worsening mood. She was last seen at [facility] on 05/26/23. At that time she was started on Wellbutrin . Please see problem based assessment and plan for additional details.    Shortness of Breath Associated symptoms include headaches.  Cough Associated symptoms include headaches and shortness of breath.  Headache  Associated symptoms include coughing.   Review of Systems  Respiratory:  Positive for cough and shortness of breath.   Neurological:  Positive for headaches.      Objective:    BP 126/76 (BP Location: Left Arm, Patient Position: Sitting, Cuff Size: Small)   Pulse 83   Temp 98.1 F (36.7 C) (Oral)   Ht 5\' 3"  (1.6 m)   Wt 108 lb 4.8 oz (49.1 kg)   SpO2 100%   BMI 19.18 kg/m  BP Readings from Last 3 Encounters:  01/29/24 126/76  08/21/23 100/60  08/16/23 125/86   Wt Readings from Last 3 Encounters:  01/29/24 108 lb 4.8 oz (49.1 kg)  08/21/23 108 lb (49 kg)  08/16/23 110 lb 1.6 oz (49.9 kg)   Physical Exam HENT:     Head: Normocephalic and atraumatic.  Cardiovascular:     Rate and Rhythm: Normal rate and regular rhythm.  Pulmonary:     Effort: Pulmonary effort is normal.     Breath sounds: Normal breath sounds.  Abdominal:     Palpations: Abdomen is soft.  Skin:    General: Skin is warm.  Neurological:     General: No focal deficit present.     Mental Status: She is alert.  Psychiatric:        Mood and Affect: Mood normal.    No results found for any visits on 01/29/24.     Assessment & Plan:   Problem List Items Addressed This Visit     Nonintractable headache   Patient has complained of headache with associated visual aura  since 2021. Today her headache is focal near right temple, has visual disturbances, and is experiencing some nausea. Feels the headache coming on, can last for more than a day. Symptoms consistent with migraine. MRI from 2021 unremarkable. Previously told to start with Tylenol  and NSAIDs for abortive therapy. Patient has been taking Tylenol  200 mg as needed with minimal to no relief. Patient was also advised headache may be worsened by insomnia and stress, which patient does experience. Discussed alternative options including sumatriptan therapy and abortive therapy with Ubrelvy or Nurtec. Reviewed precautions and risks of serotonin syndrome given other medications she is taking. Patient acknowledged understanding, provided with patient handout.  Plan - START sumatriptan 25 mg for migraine, can repeat dose 2 hours later, not to exceed two doses in 24 hours - Patient given Nurtec sample, told to take 1 dose, can repeat 24 hours later, if patient likes this medication we can consider prescribing at follow up visit in 1 month  - Handout on serotonin syndrome provided - Patient to continue Tylenol  therapy as needed if helpful       Relevant Medications   buPROPion  (WELLBUTRIN  XL) 300 MG 24 hr tablet   SUMAtriptan (IMITREX) 25 MG tablet  mirtazapine  (REMERON ) 15 MG tablet   Depression, major, recurrent (HCC) (Chronic)   Patient's PHQ-9 20 and GAD 18. Denies any suicide ideation or harm ideation. States she is very stressed with home life/family. Has experienced recent losses. Found Wellbutrin  150 mg daily helpful but stopped after she ran out of refills. Discussed importance of taking consistently. Patient also on Remeron  15 mg daily at bedtime for insomnia, states this is very helpful and she takes it consistently. Patient started on Wellbutrin  150 to also help with smoking cessation. Plan seemed to be to increase if patient tolerated well, but patient did not return to clinic for follow up. Given  worsening depression and active smoking will resume Wellbutrin  but at a higher dose. Patient to call back if she has any issues with the higher dose. Patient declined talk therapy/counseling at this time. Will monitor and continue to offer additional resources at future visits.  Plan - INCREASE Wellbutrin  from 150 mg daily to 300 mg daily - Follow up in 1 month or sooner as needed for medication management - CONTINUE Remeron  15 mg daily at bedtime       Relevant Medications   buPROPion  (WELLBUTRIN  XL) 300 MG 24 hr tablet   mirtazapine  (REMERON ) 15 MG tablet   Emphysema/COPD (HCC) - Primary (Chronic)   Patient noted to have a chronic cough due to COPD. Also has a history of adenocarcinoma of upper right lobe. Currently smoking about 5 cigarettes a week, smokes more often when she is stressed. Last COPD exacerbation was within the last year. Was following with pulmonology but Dr. Elverna Hamman left practice, has not followed up for more than a year. Dr. McQuaid had replaced the patient's albuterol  PRN inhaler for Breztri  for maintenance but patient has not used this inhaler for about a year.   Current cough does not seem to be acute in nature, seems to be part of an overall chronic process. Patient denies recent URI or congestion symptoms. Does endorse feeling subjectively short of breath, especially when talking or with activity. Does not have a working pulse ox at home. She states her temperature has gotten up to 100.7 F and she has felt hot. States her sputum is between a light yellow and a white color. Patient is not in acute distress, afebrile and saturating 100% on room air. Pulmonary exam today unremarkable.   Discussed plan to restart Breztri  maintenance inhaler and pulse ox to help determine if her O2 saturation is impacted by prolonged activity. Patient agreeable with plan, will call clinic if she needs to be re-evaluated or if there are concerns for COPD exacerbation requiring acute treatment.   Plan - RESTART Breztri  daily inhaler - Smoking cessation counseling provided      Relevant Medications   budeson-glycopyrrolate -formoterol  (BREZTRI  AEROSPHERE) 160-9-4.8 MCG/ACT AERO inhaler   Other Visit Diagnoses       Depression, unspecified depression type       Relevant Medications   buPROPion  (WELLBUTRIN  XL) 300 MG 24 hr tablet   mirtazapine  (REMERON ) 15 MG tablet       Meds ordered this encounter  Medications   buPROPion  (WELLBUTRIN  XL) 300 MG 24 hr tablet    Sig: Take 1 tablet (300 mg total) by mouth every morning.    Dispense:  30 tablet    Refill:  5   budeson-glycopyrrolate -formoterol  (BREZTRI  AEROSPHERE) 160-9-4.8 MCG/ACT AERO inhaler    Sig: Inhale 2 puffs into the lungs in the morning and at bedtime.    Dispense:  10.7 g  Refill:  5   SUMAtriptan (IMITREX) 25 MG tablet    Sig: Take 1 tablet (25 mg total) by mouth once as needed for up to 1 dose for migraine. May repeat in 2 hours if headache persists or recurs. Do NOT exceed more than 2 doses in 24 hours.    Dispense:  12 tablet    Refill:  1   mirtazapine  (REMERON ) 15 MG tablet    Sig: Take 1 tablet (15 mg total) by mouth at bedtime.    Dispense:  30 tablet    Refill:  5   Patient discussed with Dr. Adriane Albe.  Return in about 4 weeks (around 02/26/2024) for migraine, depression, and COPD.  Bailee Thall Arellano Zameza, MD PGY-1, [facility]

## 2024-01-29 NOTE — Assessment & Plan Note (Addendum)
 Patient's PHQ-9 20 and GAD 18. Denies any suicide ideation or harm ideation. States she is very stressed with home life/family. Has experienced recent losses. Found Wellbutrin  150 mg daily helpful but stopped after she ran out of refills. Discussed importance of taking consistently. Patient also on Remeron  15 mg daily at bedtime for insomnia, states this is very helpful and she takes it consistently. Patient started on Wellbutrin  150 to also help with smoking cessation. Plan seemed to be to increase if patient tolerated well, but patient did not return to clinic for follow up. Given worsening depression and active smoking will resume Wellbutrin  but at a higher dose. Patient to call back if she has any issues with the higher dose. Patient declined talk therapy/counseling at this time. Will monitor and continue to offer additional resources at future visits.  Plan - INCREASE Wellbutrin  from 150 mg daily to 300 mg daily - Follow up in 1 month or sooner as needed for medication management - CONTINUE Remeron  15 mg daily at bedtime

## 2024-02-02 NOTE — Progress Notes (Signed)
 Internal Medicine Clinic Attending  Case discussed with the resident at the time of the visit.  We reviewed the resident's history and exam and pertinent patient test results.  I agree with the assessment, diagnosis, and plan of care documented in the resident's note.

## 2024-02-06 ENCOUNTER — Encounter (HOSPITAL_COMMUNITY): Payer: Self-pay

## 2024-02-11 NOTE — Progress Notes (Unsigned)
 [facility]: Clinic Note  Subjective:  History of Present Illness: Heather [name] is a 53 y.o. year old female who presents for 1 month follow up of her migraines, depression, and COPD.  Her main concern today is left wrist pain. She works mostly on [employer] at [employer], and typing aggravates the pain. No numbness, tingling, or weakness in her hands. Has been going on for 3 weeks, never happened before.  Sumatriptan  has helped with relieving her headaches.  She increased Wellbutrin  to 300mg  daily last time, and her cravings for tobacco have improved. She's down to just 2-3 cigarettes per week. Her mood has been "better" but she still has trouble sleeping.  Since last visit, she has been taking Breztri  regularly. This spring has been tough with her allergies - she gets spells of coughing and shortness of breath. No nasal congestion.     Please refer to Assessment and Plan below for full details in Problem-Based Charting.   Past Medical History:  Patient Active Problem List   Diagnosis Date Noted   Left wrist pain 02/13/2024   Adenocarcinoma of upper lobe of right lung (HCC) 08/16/2023   Leg cramp 05/30/2023   Breast lump on right side at 11 o'clock position 04/04/2023   Family history of abdominal aortic aneurysm (AAA) 07/13/2021   Claudication in peripheral vascular disease (HCC) 07/13/2021   Insomnia 02/24/2020   Depression, major, recurrent (HCC) 02/24/2020   Visual aura 12/23/2019   Precordial pain 06/13/2018   S/P lobectomy of lung 08/20/2017   Cervical adenopathy 07/04/2017   Factor V Leiden (HCC) 04/13/2015   Prediabetes 04/13/2015   History of colon polyps 04/13/2015   Nonintractable headache 11/24/2014   Early satiety 04/28/2014   Loss of weight 04/02/2014   Current smoker 04/02/2014   Health care maintenance 04/02/2014   Emphysema/COPD (HCC) 04/02/2014      Medications:  Current Outpatient Medications:    albuterol  (VENTOLIN  HFA) 108  (90 Base) MCG/ACT inhaler, Inhale 2 puffs into the lungs every 6 (six) hours as needed for wheezing or shortness of breath., Disp: 6.7 g, Rfl: 2   cetirizine (ZYRTEC ALLERGY) 10 MG tablet, Take 1 tablet (10 mg total) by mouth daily., Disp: 100 tablet, Rfl: 3   budeson-glycopyrrolate -formoterol  (BREZTRI  AEROSPHERE) 160-9-4.8 MCG/ACT AERO inhaler, Inhale 2 puffs into the lungs in the morning and at bedtime., Disp: 10.7 g, Rfl: 5   buPROPion  (WELLBUTRIN  XL) 300 MG 24 hr tablet, Take 1 tablet (300 mg total) by mouth every morning., Disp: 30 tablet, Rfl: 5   mirtazapine  (REMERON ) 15 MG tablet, Take 1 tablet (15 mg total) by mouth at bedtime., Disp: 30 tablet, Rfl: 5   nitroGLYCERIN  (NITROSTAT ) 0.4 MG SL tablet, Place 1 tablet (0.4 mg total) under the tongue every 5 (five) minutes as needed for chest pain., Disp: 25 tablet, Rfl: 1   SUMAtriptan  (IMITREX ) 25 MG tablet, Take 1 tablet (25 mg total) by mouth once as needed for up to 1 dose for migraine. May repeat in 2 hours if headache persists or recurs. Do NOT exceed more than 2 doses in 24 hours., Disp: 12 tablet, Rfl: 1   Allergies: Allergies  Allergen Reactions   Iohexol  Shortness Of Breath, Swelling and Rash    Patient indicates she had swelling all over her body as well as shortness of breath.  Even when she was premedicated, she broke out in a full body rash.   Aspirin  Nausea And Vomiting  Objective:   Vitals: Vitals:   02/13/24 0814 02/13/24 0848  BP: (!) 99/53 108/75  Pulse: 84 81  Temp: 98.3 F (36.8 C)   SpO2: 100%      Physical Exam: Physical Exam Constitutional:      Appearance: Normal appearance.  Cardiovascular:     Rate and Rhythm: Normal rate and regular rhythm.  Pulmonary:     Effort: Pulmonary effort is normal.     Breath sounds: Normal breath sounds.  Musculoskeletal:     Comments: + L lateral wrist tender to palpation; no effusion or crepitus; no warmth or erythema; pain with rotating L wrist  Neurological:      Mental Status: She is alert.      Data: Labs, imaging, and micro were reviewed in Epic. Refer to Assessment and Plan below for full details in Problem-Based Charting.  Assessment & Plan:  Left wrist pain - Presentation is not consistent with carpal tunnel or ulnar nerve entrapment. She has not had any trauma. No evidence of fracture or rheumatologic disorder on exam. I think this is an overuse injury from typing - Trial of OTC L wrist split - Okay to use OTC NSAIDs, I recommended Aleve  Nonintractable headache - chronic and improved with Sumatriptan  - Continue sumatriptan  25mg  as needed, refilled today - tylenol  as needed  Depression, major, recurrent (HCC) - Chronic and poorly controlled - PHQ9 is 18 from 20 last time. No suicidal ideation. Lots of family stressors - She finds the Wellbutrin  300mg  daily to be helpful, so we will continue this - Continue Remeron  15mg  daily at bedtime - I recommend BHT - patient has done this before and politely declined today - PHQ9 every visit  Emphysema/COPD (HCC) - chronic and currently active - Continue Breztri , which she is taking regularly - Add prn albuterol  inhaler, sent in today - Start Zyrtec 10mg  daily during peak allergy season  - Working on smoking cessation - Pneumonia vaccine today  Health care maintenance - Patient declined pap today - Pneumovax today     Patient will follow up in 6 months or as needed if sooner  Heather Vellucci, MD

## 2024-02-13 ENCOUNTER — Ambulatory Visit: Payer: Self-pay | Admitting: Internal Medicine

## 2024-02-13 ENCOUNTER — Other Ambulatory Visit (HOSPITAL_COMMUNITY): Payer: Self-pay

## 2024-02-13 ENCOUNTER — Encounter: Payer: Self-pay | Admitting: Internal Medicine

## 2024-02-13 VITALS — BP 108/75 | HR 81 | Temp 98.3°F | Ht 63.0 in | Wt 106.8 lb

## 2024-02-13 DIAGNOSIS — J432 Centrilobular emphysema: Secondary | ICD-10-CM

## 2024-02-13 DIAGNOSIS — M25532 Pain in left wrist: Secondary | ICD-10-CM

## 2024-02-13 DIAGNOSIS — F17210 Nicotine dependence, cigarettes, uncomplicated: Secondary | ICD-10-CM

## 2024-02-13 DIAGNOSIS — I959 Hypotension, unspecified: Secondary | ICD-10-CM | POA: Diagnosis not present

## 2024-02-13 DIAGNOSIS — F331 Major depressive disorder, recurrent, moderate: Secondary | ICD-10-CM

## 2024-02-13 DIAGNOSIS — Z0000 Encounter for general adult medical examination without abnormal findings: Secondary | ICD-10-CM

## 2024-02-13 DIAGNOSIS — F17200 Nicotine dependence, unspecified, uncomplicated: Secondary | ICD-10-CM

## 2024-02-13 DIAGNOSIS — G43709 Chronic migraine without aura, not intractable, without status migrainosus: Secondary | ICD-10-CM

## 2024-02-13 DIAGNOSIS — Z23 Encounter for immunization: Secondary | ICD-10-CM

## 2024-02-13 DIAGNOSIS — R519 Headache, unspecified: Secondary | ICD-10-CM

## 2024-02-13 DIAGNOSIS — J439 Emphysema, unspecified: Secondary | ICD-10-CM

## 2024-02-13 MED ORDER — SUMATRIPTAN SUCCINATE 25 MG PO TABS
25.0000 mg | ORAL_TABLET | Freq: Once | ORAL | 1 refills | Status: DC | PRN
  Filled 2024-02-13 – 2024-04-08 (×2): qty 12, 30d supply, fill #0
  Filled 2024-06-12: qty 12, 30d supply, fill #1

## 2024-02-13 MED ORDER — CETIRIZINE HCL 10 MG PO TABS
10.0000 mg | ORAL_TABLET | Freq: Every day | ORAL | 3 refills | Status: AC
  Filled 2024-02-13: qty 100, 100d supply, fill #0

## 2024-02-13 MED ORDER — ALBUTEROL SULFATE HFA 108 (90 BASE) MCG/ACT IN AERS
2.0000 | INHALATION_SPRAY | Freq: Four times a day (QID) | RESPIRATORY_TRACT | 2 refills | Status: DC | PRN
  Filled 2024-02-13: qty 6.7, 25d supply, fill #0

## 2024-02-13 NOTE — Patient Instructions (Signed)
 Thank you, Heather [name] for allowing us  to provide your care today. Today we discussed your breathing, your wrist pain, and your routine health care.    I have ordered the following labs for you:   Lab Orders         Lipid Profile         BMP8+Anion Gap       Referrals ordered today:   Referral Orders  No referral(s) requested today     I have ordered the following medication/changed the following medications:   Stop the following medications: Medications Discontinued During This Encounter  Medication Reason   SUMAtriptan  (IMITREX ) 25 MG tablet Reorder     Start the following medications: Meds ordered this encounter  Medications   SUMAtriptan  (IMITREX ) 25 MG tablet    Sig: Take 1 tablet (25 mg total) by mouth once as needed for up to 1 dose for migraine. May repeat in 2 hours if headache persists or recurs. Do NOT exceed more than 2 doses in 24 hours.    Dispense:  12 tablet    Refill:  1   albuterol  (VENTOLIN  HFA) 108 (90 Base) MCG/ACT inhaler    Sig: Inhale 2 puffs into the lungs every 6 (six) hours as needed for wheezing or shortness of breath.    Dispense:  8 g    Refill:  2   cetirizine (ZYRTEC ALLERGY) 10 MG tablet    Sig: Take 1 tablet (10 mg total) by mouth daily.    Dispense:  90 tablet    Refill:  3     Return in about 6 months (around 08/15/2024) for Chronic medical conditions.    Remember:  - For your breathing, keep taking Breztri  every day. I have sent in Zyrtec (Cetirizine) to your pharmacy - during peak allergy season, take this daily to help your allergies. I have also sent in an albuterol  inhaler to use as needed for shortness of breath - We will do your Pap next time if that's okay with you - for your left wrist pain, I recommend that you try a wrist split, especially while you are typing at work. You can also use over the counter anti-inflammatory medicines, such as Aleve. Take these with food.   Should you have any questions or concerns  please call the internal medicine clinic at [phone].     Fonnie Crookshanks, MD Faculty, Internal Medicine Teaching Progam [facility]

## 2024-02-13 NOTE — Assessment & Plan Note (Signed)
-   chronic and currently active - Continue Breztri , which she is taking regularly - Add prn albuterol  inhaler, sent in today - Start Zyrtec 10mg  daily during peak allergy season  - Working on smoking cessation - Pneumonia vaccine today

## 2024-02-13 NOTE — Assessment & Plan Note (Signed)
-   Presentation is not consistent with carpal tunnel or ulnar nerve entrapment. She has not had any trauma. No evidence of fracture or rheumatologic disorder on exam. I think this is an overuse injury from typing - Trial of OTC L wrist split - Okay to use OTC NSAIDs, I recommended Aleve

## 2024-02-13 NOTE — Assessment & Plan Note (Signed)
-   Chronic and poorly controlled - PHQ9 is 18 from 20 last time. No suicidal ideation. Lots of family stressors - She finds the Wellbutrin  300mg  daily to be helpful, so we will continue this - Continue Remeron  15mg  daily at bedtime - I recommend BHT - patient has done this before and politely declined today - PHQ9 every visit

## 2024-02-13 NOTE — Assessment & Plan Note (Signed)
-   Patient declined pap today - Pneumovax today

## 2024-02-13 NOTE — Assessment & Plan Note (Signed)
-   chronic and improved with Sumatriptan  - Continue sumatriptan  25mg  as needed, refilled today - tylenol  as needed

## 2024-02-14 ENCOUNTER — Other Ambulatory Visit (HOSPITAL_COMMUNITY): Payer: Self-pay

## 2024-02-14 ENCOUNTER — Ambulatory Visit: Payer: Self-pay | Admitting: Internal Medicine

## 2024-02-14 LAB — LIPID PANEL
Chol/HDL Ratio: 3 ratio (ref 0.0–4.4)
Cholesterol, Total: 190 mg/dL (ref 100–199)
HDL: 63 mg/dL (ref >39)
LDL Chol Calc (NIH): 109 mg/dL — ABNORMAL HIGH (ref 0–99)
Triglycerides: 102 mg/dL (ref 0–149)
VLDL Cholesterol Cal: 18 mg/dL (ref 5–40)

## 2024-02-14 LAB — BMP8+ANION GAP
Anion Gap: 17 mmol/L (ref 10.0–18.0)
BUN/Creatinine Ratio: 6 — ABNORMAL LOW (ref 9–23)
BUN: 5 mg/dL — ABNORMAL LOW (ref 6–24)
CO2: 21 mmol/L (ref 20–29)
Calcium: 9.3 mg/dL (ref 8.7–10.2)
Chloride: 106 mmol/L (ref 96–106)
Creatinine, Ser: 0.79 mg/dL (ref 0.57–1.00)
Glucose: 87 mg/dL (ref 70–99)
Potassium: 4.1 mmol/L (ref 3.5–5.2)
Sodium: 144 mmol/L (ref 134–144)
eGFR: 89 mL/min/{1.73_m2} (ref >59)

## 2024-02-19 ENCOUNTER — Other Ambulatory Visit (HOSPITAL_COMMUNITY): Payer: Self-pay

## 2024-02-26 ENCOUNTER — Other Ambulatory Visit (HOSPITAL_COMMUNITY): Payer: Self-pay

## 2024-03-19 ENCOUNTER — Other Ambulatory Visit: Payer: Self-pay

## 2024-04-08 ENCOUNTER — Other Ambulatory Visit (HOSPITAL_COMMUNITY): Payer: Self-pay

## 2024-04-08 ENCOUNTER — Other Ambulatory Visit: Payer: Self-pay | Admitting: Internal Medicine

## 2024-04-08 DIAGNOSIS — Z1231 Encounter for screening mammogram for malignant neoplasm of breast: Secondary | ICD-10-CM

## 2024-04-16 ENCOUNTER — Encounter: Payer: Self-pay | Admitting: *Deleted

## 2024-05-15 ENCOUNTER — Other Ambulatory Visit (HOSPITAL_COMMUNITY): Payer: Self-pay

## 2024-05-23 ENCOUNTER — Ambulatory Visit
Admission: RE | Admit: 2024-05-23 | Discharge: 2024-05-23 | Disposition: A | Discharge status: Home / Self Care | Source: Ambulatory Visit

## 2024-05-23 DIAGNOSIS — Z1231 Encounter for screening mammogram for malignant neoplasm of breast: Secondary | ICD-10-CM

## 2024-06-13 ENCOUNTER — Emergency Department (HOSPITAL_COMMUNITY)

## 2024-06-13 ENCOUNTER — Emergency Department (EMERGENCY_DEPARTMENT_HOSPITAL)

## 2024-06-13 ENCOUNTER — Ambulatory Visit: Payer: Self-pay

## 2024-06-13 ENCOUNTER — Encounter: Payer: Self-pay | Admitting: Cardiology

## 2024-06-13 ENCOUNTER — Telehealth: Payer: Self-pay

## 2024-06-13 ENCOUNTER — Emergency Department (HOSPITAL_COMMUNITY)
Admission: EM | Admit: 2024-06-13 | Discharge: 2024-06-13 | Discharge status: Against Medical Advice | Source: Ambulatory Visit | Attending: Emergency Medicine | Admitting: Emergency Medicine

## 2024-06-13 ENCOUNTER — Other Ambulatory Visit: Payer: Self-pay

## 2024-06-13 DIAGNOSIS — Z5321 Procedure and treatment not carried out due to patient leaving prior to being seen by health care provider: Secondary | ICD-10-CM | POA: Insufficient documentation

## 2024-06-13 DIAGNOSIS — D6851 Activated protein C resistance: Secondary | ICD-10-CM

## 2024-06-13 DIAGNOSIS — F17200 Nicotine dependence, unspecified, uncomplicated: Secondary | ICD-10-CM | POA: Diagnosis not present

## 2024-06-13 DIAGNOSIS — Z859 Personal history of malignant neoplasm, unspecified: Secondary | ICD-10-CM | POA: Diagnosis not present

## 2024-06-13 DIAGNOSIS — R0602 Shortness of breath: Secondary | ICD-10-CM | POA: Insufficient documentation

## 2024-06-13 DIAGNOSIS — R0789 Other chest pain: Secondary | ICD-10-CM | POA: Diagnosis not present

## 2024-06-13 DIAGNOSIS — R000 Tachycardia, unspecified: Secondary | ICD-10-CM | POA: Diagnosis not present

## 2024-06-13 DIAGNOSIS — J929 Pleural plaque without asbestos: Secondary | ICD-10-CM | POA: Diagnosis not present

## 2024-06-13 DIAGNOSIS — R002 Palpitations: Secondary | ICD-10-CM | POA: Diagnosis not present

## 2024-06-13 LAB — COMPREHENSIVE METABOLIC PANEL WITH GFR
ALT: 10 U/L (ref 0–44)
AST: 16 U/L (ref 15–41)
Albumin: 4.6 g/dL (ref 3.5–5.0)
Alkaline Phosphatase: 51 U/L (ref 38–126)
Anion gap: 14 (ref 5–15)
BUN: 11 mg/dL (ref 6–20)
CO2: 21 mmol/L — ABNORMAL LOW (ref 22–32)
Calcium: 9.5 mg/dL (ref 8.9–10.3)
Chloride: 104 mmol/L (ref 98–111)
Creatinine, Ser: 0.92 mg/dL (ref 0.44–1.00)
GFR, Estimated: >60 mL/min (ref >60)
Glucose, Bld: 103 mg/dL — ABNORMAL HIGH (ref 70–99)
Potassium: 3.6 mmol/L (ref 3.5–5.1)
Sodium: 139 mmol/L (ref 135–145)
Total Bilirubin: 0.6 mg/dL (ref 0.0–1.2)
Total Protein: 7.3 g/dL (ref 6.5–8.1)

## 2024-06-13 LAB — CBC WITH DIFFERENTIAL/PLATELET
Abs Immature Granulocytes: 0.02 K/uL (ref 0.00–0.07)
Basophils Absolute: 0.1 K/uL (ref 0.0–0.1)
Basophils Relative: 1 %
Eosinophils Absolute: 0.1 K/uL (ref 0.0–0.5)
Eosinophils Relative: 2 %
HCT: 43.3 % (ref 36.0–46.0)
Hemoglobin: 13.8 g/dL (ref 12.0–15.0)
Immature Granulocytes: 0 %
Lymphocytes Relative: 20 %
Lymphs Abs: 1.4 K/uL (ref 0.7–4.0)
MCH: 30.4 pg (ref 26.0–34.0)
MCHC: 31.9 g/dL (ref 30.0–36.0)
MCV: 95.4 fL (ref 80.0–100.0)
Monocytes Absolute: 0.4 K/uL (ref 0.1–1.0)
Monocytes Relative: 6 %
Neutro Abs: 4.9 K/uL (ref 1.7–7.7)
Neutrophils Relative %: 71 %
Platelets: 145 K/uL — ABNORMAL LOW (ref 150–400)
RBC: 4.54 MIL/uL (ref 3.87–5.11)
RDW: 13.8 % (ref 11.5–15.5)
WBC: 6.9 K/uL (ref 4.0–10.5)
nRBC: 0 % (ref 0.0–0.2)

## 2024-06-13 LAB — I-STAT CHEM 8, ED
BUN: 13 mg/dL (ref 6–20)
Calcium, Ion: 1.12 mmol/L — ABNORMAL LOW (ref 1.15–1.40)
Chloride: 107 mmol/L (ref 98–111)
Creatinine, Ser: 0.8 mg/dL (ref 0.44–1.00)
Glucose, Bld: 102 mg/dL — ABNORMAL HIGH (ref 70–99)
HCT: 41 % (ref 36.0–46.0)
Hemoglobin: 13.9 g/dL (ref 12.0–15.0)
Potassium: 3.8 mmol/L (ref 3.5–5.1)
Sodium: 142 mmol/L (ref 135–145)
TCO2: 22 mmol/L (ref 22–32)

## 2024-06-13 LAB — TROPONIN I (HIGH SENSITIVITY): Troponin I (High Sensitivity): 2 ng/L (ref <18)

## 2024-06-13 LAB — MAGNESIUM: Magnesium: 2 mg/dL (ref 1.7–2.4)

## 2024-06-13 LAB — D-DIMER, QUANTITATIVE: D-Dimer, Quant: 0.3 ug{FEU}/mL (ref 0.00–0.50)

## 2024-06-13 LAB — TSH: TSH: 1.783 u[IU]/mL (ref 0.350–4.500)

## 2024-06-13 NOTE — ED Notes (Signed)
 Pt stated she cannot drive at night and need to get home. Pt ambulated out of ER

## 2024-06-13 NOTE — Progress Notes (Signed)
 Bilateral lower extremity venous duplex has been completed. Results can be found in chart review under CV Proc.  06/13/2024 3:42 PM  Edilia Elden Appl, RVT.

## 2024-06-13 NOTE — Telephone Encounter (Signed)
 FYI Only or Action Required?: Action required by provider: request for appointment.  Patient is followed in Pulmonology for centrilobular emphysema , last seen on 11/07/2022 by Alaine Vicenta NOVAK, MD.  Called Nurse Triage reporting Shortness of Breath.  Symptoms began [name] ago.  Symptoms are: gradually worsening.  Triage Disposition: Go to ED Now (Notify PCP)  Patient/caregiver understands and will follow disposition?: No, wishes to speak with PCP                    Copied from CRM #[id number]. Topic: Clinical - Red Word Triage >> Jun 13, 2024 11:03 AM Heather [name] wrote: Heather [name] that prompted transfer to Nurse Triage: Experiencing SOB last night, when she gets up her heart rate is going up to 156, coughing up green/brown thick phlegm, also getting dizzy at times. Reason for Disposition  [1] MODERATE difficulty breathing (e.g., speaks in phrases, SOB even at rest, pulse 100-120) AND [2] NEW-onset or WORSE than normal  Answer Assessment - Initial Assessment Questions This RN recommends pt goes to ED but pt refused. This RN notified [facility] but was told pt is considered [name] new pt since Dr. Alaine is no longer with the practice. This RN will send [name] high priority message to clinic for scheduling as this RN will not be able to schedule due to lower acuity.  Elevated HR and SOB with activity Onset: in the last week, has worsened HR has been going higher when doing things; no issue when not doing activity 156 HR this morning when walking across the hall Breathing heavier and HR goes up with activity Intermittent dizziness Intermittent chest discomfort at times, feels like fluttering, noticed in past week  Pt states she called her cardiologist and was recommended to try and get back with pulmonologist   O2 SATURATION MONITOR:  Do you use an oxygen  saturation monitor (pulse oximeter) at home? If Yes, ask: What is your reading (oxygen  level) today? What is your usual oxygen   saturation reading? (e.g., 95%)       98%  Protocols used: Breathing Difficulty-[name]-AH

## 2024-06-13 NOTE — ED Provider Triage Note (Signed)
 Emergency Medicine Provider Triage Evaluation Note  Heather [name] , a 53 y.o. female  was evaluated in triage.  Pt complains of palpitations and SOB/DOE. She reports that she has been having DOE/SOB since November 2019 whenever she had cancer surgery. She has followed up multiple times with this and thye report its likely her COPD. She reports that it has been worsening, especially over the past few weeks. She reports that she is active, liek making her bed, her heart rate has gone to 156. She reports at rest she does not feel any palpitations. Denies any recent cough or cold symptoms. Does still smoke, h/o cancer, does have Factor V Leiden, but never h/o DVT/PE.  Review of Systems  Positive:  Negative:   Physical Exam  BP (!) 153/87 (BP Location: Left Arm)   Pulse (!) 122   Temp 98.2 F (36.8 C)   Resp 18   Ht 5' 3 (1.6 m)   Wt 48.4 kg   SpO2 96%   BMI 18.90 kg/m  Gen:   Awake, no distress   Resp:  Normal effort  MSK:   Moves extremities without difficulty  Other:  CTAB. Palpable pulses.   Medical Decision Making  Medically screening exam initiated at 2:27 PM.  Appropriate orders placed.  Jhanvi Drakeford was informed that the remainder of the evaluation will be completed by another provider, this initial triage assessment does not replace that evaluation, and the importance of remaining in the ED until their evaluation is complete.  Discussed this case with attending physician given patient's allergy to contrast media. Will order Bilateral DVT studies AND dimer given her risk factors. Cardiac workup included as well.    Bernis Ernst, PA-C 06/13/24 1430

## 2024-06-13 NOTE — ED Triage Notes (Signed)
 Pt here from work was told by her cards md to come get checked out for chest pain and heart rate in the 120's

## 2024-06-13 NOTE — Telephone Encounter (Signed)
 Noted.  Thanks MJP

## 2024-06-13 NOTE — ED Triage Notes (Signed)
 Pt has had sob and tachycardia on and off since surgery in 2019. SOB is upon exertion for 1 week and has gotten worse.

## 2024-06-13 NOTE — Telephone Encounter (Signed)
 Spoke with pt, she is not experiencing this feeling currently and is at work and doing okay this happened early this morning. I did advise she needed to go be checked out she states she will proceed to ED or UC if it comes back. She states she is employed at [employer] and knows where to go and what to look for. She states her cardiologist had advised she need to get back in with pulmonary. Pt scheduled appt on 9/18

## 2024-06-13 NOTE — Telephone Encounter (Signed)
 Call to patient to discuss concerns.   Patient reports that for the past 3 days she has been experiencing SOB on exertion. She has a history of lung cancer and COPD. She states she frequently uses her albuterol  inhaler and has been taking her Breztri  inhaler BID as ordered, but still has chronic cough which has been productive recently with green-tinged sputum. She endorses chest pressure but has not taken any nitroglycerin . She reports that her SOB and elevated HR improve with rest.She also reports that her cigarette consumption is down to a few cigarettes a week as she has been trying to quit.   Due to productive cough, advised patient go to Urgent Care as she may be having a COPD exacerbation. Pulled up open appointments at Urgent Care next door to patient's place of work which had appts today, patient declines this option as those times don't work for me and also declines to go to ED for work-up. Patient states she would rather go to Urgent Care tomorrow if she is still feeling bad as her Sp02 is 95%.   Patient last saw J. Emelia, NP in November of 2024, made November appointment with Dr. Elmira for yearly follow up and also offered DOD appt. Patient accepts appointment with Dr. Elmira in November and declines DOD appt. Discussed ED precautions, patient verbalizes understanding to call 911 or go to ED if shortness of breath worsens or if she develops fever.

## 2024-06-16 ENCOUNTER — Encounter: Payer: Self-pay | Admitting: Cardiology

## 2024-06-16 ENCOUNTER — Other Ambulatory Visit (HOSPITAL_COMMUNITY): Payer: Self-pay

## 2024-06-16 NOTE — Telephone Encounter (Signed)
 Reviewed chart from recent ER visit.  D-dimer was negative, which would make DVT/PE unlikely.  High sensitive troponin was normal, which makes heart attack unlikely.  If patient continues to have recurrent symptoms, reasonable to move the appointment from November to sooner.  If no recurrent symptoms, follow-up in November is okay.  If needs to be seen sooner and I do not have any availability, can be seen with by provider.  Thanks MJP

## 2024-06-18 ENCOUNTER — Ambulatory Visit

## 2024-06-18 ENCOUNTER — Other Ambulatory Visit (HOSPITAL_COMMUNITY): Payer: Self-pay

## 2024-06-18 VITALS — BP 134/79 | HR 88 | Temp 97.6°F | Ht 63.0 in | Wt 100.0 lb

## 2024-06-18 DIAGNOSIS — F32A Depression, unspecified: Secondary | ICD-10-CM | POA: Insufficient documentation

## 2024-06-18 DIAGNOSIS — G2581 Restless legs syndrome: Secondary | ICD-10-CM | POA: Insufficient documentation

## 2024-06-18 DIAGNOSIS — F419 Anxiety disorder, unspecified: Secondary | ICD-10-CM

## 2024-06-18 DIAGNOSIS — F17200 Nicotine dependence, unspecified, uncomplicated: Secondary | ICD-10-CM

## 2024-06-18 DIAGNOSIS — F331 Major depressive disorder, recurrent, moderate: Secondary | ICD-10-CM | POA: Diagnosis not present

## 2024-06-18 DIAGNOSIS — F430 Acute stress reaction: Secondary | ICD-10-CM | POA: Diagnosis not present

## 2024-06-18 MED ORDER — TRAZODONE HCL 50 MG PO TABS
50.0000 mg | ORAL_TABLET | Freq: Every day | ORAL | 1 refills | Status: DC | PRN
Start: 2024-06-18 — End: 2024-08-06
  Filled 2024-06-18: qty 14, 14d supply, fill #0
  Filled 2024-07-17: qty 14, 14d supply, fill #1

## 2024-06-18 NOTE — Assessment & Plan Note (Signed)
 Currently on Wellbutrin  300 mg every day with good response, down to 3-4 cigs/wk. Counseled on quitting, offered NRT which pt declined. Likely worsened in the setting of acute stress of family loss, she is very motivated to quit.

## 2024-06-18 NOTE — Assessment & Plan Note (Signed)
BH referral

## 2024-06-18 NOTE — Assessment & Plan Note (Signed)
 Will stop Remeron  given lack of effect as well as high association with RLS. Would like to eventually trial this pt on an SSRI however am hesitant to start new serotonergic agent in the setting of her RLS worsening. Her recent mood symptoms seem directly related to the loss of her sister and she cites sleep difficulty as her main functioning-inhibiting symptom and so will trial nightly trazodone  to help w/ sleep.  - start trazodone  50 mg qnightly - BH referral provided - plan to start SSRI at next visit in 1 month

## 2024-06-18 NOTE — Progress Notes (Signed)
 Subjective:   Patient ID: Heather [name] female   DOB: 09/30/1971 53 y.o.   MRN: [id number]  HPI: [name] is a 53 y.o. F Pmhx COPD, MDD, GAD, tobacco use disorder presenting for MDD/GAD management & restless legs.   MDD/GAD - mood symptoms are worsened today as pt recently suffered loss of her sister. PHQ9 27 today, GAD 21. She denies any thoughts of SI. The loss of her sister has made her anxious, given her difficulty with sleep (particularly with staying asleep) and worsened her acute functioning. She feels her RLS symptoms have worsened over the past few weeks in the wake of this traumatic event. She has not been trialed on SSRI's before, Remeron  was started for dual mood/sleep effects but she has not seen much of a benefit. She is tolerating Wellbutrin  300 mg well and this has helped particularly with her smoking.  RLS, sleep difficulty - she endorses difficulty with RLS and sleep for years but has been particularly worsening over the past few weeks. 05/2023 ferritin 122.   Tobacco use disorder - On wellbutrin , down to 3-4 cigarettes/week but has been struggling with quitting particularly with the acute stress of recent events. Declines NRT today.  ED visit - directed to ED by [facility] office after reporting SOB, ED workup notable for trop 2, D-dimer wnl, EKG SR with no acute ischemic changes. She eloped from ED prior to completion of workup. She has follow up with Pulm tomorrow and Cardiology 08/06/24.  Past Medical History:  Diagnosis Date   Blood dyscrasia    COPD (chronic obstructive pulmonary disease) (HCC)    Current smoker    Difficult intubation    pt was told difficult intubation at [facility]   Factor V deficiency (HCC)    protein deficiency   H/O drug rash due to contrast  05/14/2014   Headache    weekly (09/04/2017)   Heart murmur    Hypokalemia 10/15/2017   Lung adenocarcinoma dx'd 08/2017   s/p RU lobectomy   Pleuritic chest pain 06/13/2018   Pre-diabetes     Pulmonary nodule 07/16/2017   Rapid resting heart rate    happens infrequently   Rib pain on right side 12/21/2017   Current Outpatient Medications  Medication Sig Dispense Refill   albuterol  (VENTOLIN  HFA) 108 (90 Base) MCG/ACT inhaler Inhale 2 puffs into the lungs every 6 (six) hours as needed for wheezing or shortness of breath. 6.7 g 2   budesonide -glycopyrrolate -formoterol  (BREZTRI  AEROSPHERE) 160-9-4.8 MCG/ACT AERO inhaler Inhale 2 puffs into the lungs in the morning and at bedtime. 10.7 g 5   buPROPion  (WELLBUTRIN  XL) 300 MG 24 hr tablet Take 1 tablet (300 mg total) by mouth every morning. 30 tablet 5   cetirizine  (ZYRTEC  ALLERGY) 10 MG tablet Take 1 tablet (10 mg total) by mouth daily. 100 tablet 3   mirtazapine  (REMERON ) 15 MG tablet Take 1 tablet (15 mg total) by mouth at bedtime. 30 tablet 5   nitroGLYCERIN  (NITROSTAT ) 0.4 MG SL tablet Place 1 tablet (0.4 mg total) under the tongue every 5 (five) minutes as needed for chest pain. 25 tablet 1   SUMAtriptan  (IMITREX ) 25 MG tablet Take 1 tablet (25 mg total) by mouth once as needed for up to 1 dose for migraine. May repeat in 2 hours if headache persists or recurs. Do NOT exceed more than 2 doses in 24 hours. 12 tablet 1   No current facility-administered medications for this visit.   Family History  Problem  Relation Age of Onset   Heart disease Mother    Heart disease Father    Colon polyps Father    Breast cancer Sister 11   Colon cancer Paternal Uncle    Colon polyps Paternal Uncle    Stomach cancer Maternal Grandmother    Social History   Socioeconomic History   Marital status: Divorced    Spouse name: Not on file   Number of children: 2   Years of education: Not on file   Highest education level: Not on file  Occupational History   Not on file  Tobacco Use   Smoking status: Some Days    Current packs/day: 2.00    Average packs/day: 2.0 packs/day for 41.7 years (83.4 ttl pk-yrs)    Types: Cigarettes    Start  date: 1984   Smokeless tobacco: Never   Tobacco comments:    1-3 cigarette per week currently  Vaping Use   Vaping status: Never Used  Substance and Sexual Activity   Alcohol use: Not Currently    Alcohol/week: 0.0 standard drinks of alcohol    Comment: 09/04/2017 a mixed drink a few times/year   Drug use: No   Sexual activity: Not Currently  Other Topics Concern   Not on file  Social History Narrative   Not on file   Social Drivers of Health   Financial Resource Strain: Medium Risk (09/04/2017)   Overall Financial Resource Strain (CARDIA)    Difficulty of Paying Living Expenses: Somewhat hard  Food Insecurity: No Food Insecurity (09/04/2017)   Hunger Vital Sign    Worried About Running Out of Food in the Last Year: Never true    Ran Out of Food in the Last Year: Never true  Transportation Needs: No Transportation Needs (09/04/2017)   PRAPARE - Administrator, Civil Service (Medical): No    Lack of Transportation (Non-Medical): No  Physical Activity: Sufficiently Active (09/04/2017)   Exercise Vital Sign    Days of Exercise per Week: 3 days    Minutes of Exercise per Session: 60 min  Stress: Stress Concern Present (09/04/2017)   [employer] of Occupational Health - Occupational Stress Questionnaire    Feeling of Stress : Very much  Social Connections: Moderately Isolated (09/04/2017)   Social Connection and Isolation Panel    Frequency of Communication with Friends and Family: Once a week    Frequency of Social Gatherings with Friends and Family: More than three times a week    Attends Religious Services: Never    Database administrator or Organizations: No    Attends Banker Meetings: Never    Marital Status: Divorced   Review of Systems: Pertinent items are noted in HPI. Objective:   Vitals:   06/18/24 0817  BP: (!) 145/87  Pulse: 86  Temp: 97.6 F (36.4 C)  TempSrc: Oral  SpO2: 100%  Weight: 100 lb (45.4 kg)  Height: 5' 3 (1.6 m)     Physical Exam: GEN: Well appearing, NAD. Oriented x 3, normal mood and affect  HEENT: Normocephalic, atraumatic, Conjunctiva clear, sclera non-icteric, EOM intact MSK: b/l LE wnl NEURO: moving all extremities spontaneously in upper and lower extremities. PSYCH: Oriented X3, intact recent and remote memory, judgment and insight, normal mood and affect.   Assessment & Plan:   Assessment & Plan Moderate episode of recurrent major depressive disorder (HCC) Will stop Remeron  given lack of effect as well as high association with RLS. Would like to eventually trial  this pt on an SSRI however am hesitant to start new serotonergic agent in the setting of her RLS worsening. Her recent mood symptoms seem directly related to the loss of her sister and she cites sleep difficulty as her main functioning-inhibiting symptom and so will trial nightly trazodone  to help w/ sleep.  - start trazodone  50 mg qnightly - BH referral provided - plan to start SSRI at next visit in 1 month Anxiety - BH referral Acute stress disorder - BH referral Tobacco use disorder Currently on Wellbutrin  300 mg every day with good response, down to 3-4 cigs/wk. Counseled on quitting, offered NRT which pt declined. Likely worsened in the setting of acute stress of family loss, she is very motivated to quit.  Restless leg syndrome Ferritin checked in 05/2023 was wnl. No s/s new IDA. Suspect combination of medication effect as well as recent acute psychosocial stressors that may have exacerbated current symptoms. Will trial off Remeron  and monitor effect. Can get ferritin with next set of labs if RLS persists and is interfering with her QoL.  Jone Dauphin MD

## 2024-06-18 NOTE — Assessment & Plan Note (Signed)
 Ferritin checked in 05/2023 was wnl. No s/s new IDA. Suspect combination of medication effect as well as recent acute psychosocial stressors that may have exacerbated current symptoms. Will trial off Remeron  and monitor effect. Can get ferritin with next set of labs if RLS persists and is interfering with her QoL.

## 2024-06-18 NOTE — Patient Instructions (Addendum)
 Thank you for coming in today. If you have any questions or concerns, please feel free to contact me via MyChart or call the office.   Changes that we made today:  - Please stop your Remeron  as this can worsen restless leg syndrome. If your mood worsens, let me know and I can add on a medication to help.  - I have started you on Trazodone  to help with sleep. This is a temporary medication.   If you have gotten labs today, I will be in touch via MyChart or telephone.   Have a great day.

## 2024-06-19 ENCOUNTER — Other Ambulatory Visit (HOSPITAL_COMMUNITY): Payer: Self-pay

## 2024-06-19 ENCOUNTER — Ambulatory Visit

## 2024-06-19 ENCOUNTER — Other Ambulatory Visit: Payer: Self-pay

## 2024-06-19 VITALS — BP 134/84 | HR 93 | Ht 63.0 in | Wt 100.2 lb

## 2024-06-19 DIAGNOSIS — Z716 Tobacco abuse counseling: Secondary | ICD-10-CM | POA: Diagnosis not present

## 2024-06-19 DIAGNOSIS — Z122 Encounter for screening for malignant neoplasm of respiratory organs: Secondary | ICD-10-CM

## 2024-06-19 DIAGNOSIS — F32A Depression, unspecified: Secondary | ICD-10-CM

## 2024-06-19 DIAGNOSIS — G2581 Restless legs syndrome: Secondary | ICD-10-CM

## 2024-06-19 DIAGNOSIS — F418 Other specified anxiety disorders: Secondary | ICD-10-CM | POA: Diagnosis not present

## 2024-06-19 DIAGNOSIS — J441 Chronic obstructive pulmonary disease with (acute) exacerbation: Secondary | ICD-10-CM

## 2024-06-19 DIAGNOSIS — J432 Centrilobular emphysema: Secondary | ICD-10-CM

## 2024-06-19 MED ORDER — VARENICLINE TARTRATE 1 MG PO TABS
1.0000 mg | ORAL_TABLET | Freq: Two times a day (BID) | ORAL | 1 refills | Status: DC
  Filled 2024-06-19 – 2024-07-17 (×2): qty 60, 30d supply, fill #0

## 2024-06-19 MED ORDER — NICOTINE 14 MG/24HR TD PT24
14.0000 mg | MEDICATED_PATCH | Freq: Every day | TRANSDERMAL | 0 refills | Status: DC
  Filled 2024-06-19: qty 28, 28d supply, fill #0

## 2024-06-19 MED ORDER — PREDNISONE 20 MG PO TABS
40.0000 mg | ORAL_TABLET | Freq: Every day | ORAL | 0 refills | Status: DC
  Filled 2024-06-19: qty 10, 5d supply, fill #0

## 2024-06-19 MED ORDER — VARENICLINE TARTRATE (STARTER) 0.5 MG X 11 & 1 MG X 42 PO TBPK
ORAL_TABLET | ORAL | 0 refills | Status: DC
  Filled 2024-06-19: qty 53, 30d supply, fill #0

## 2024-06-19 MED ORDER — NICOTINE 7 MG/24HR TD PT24
7.0000 mg | MEDICATED_PATCH | Freq: Every day | TRANSDERMAL | 0 refills | Status: DC
  Filled 2024-06-19 – 2024-07-17 (×2): qty 28, 28d supply, fill #0

## 2024-06-19 NOTE — Addendum Note (Signed)
 Addended by: Chananya Canizalez on: 06/19/2024 01:48 PM   Modules accepted: Orders

## 2024-06-19 NOTE — Progress Notes (Addendum)
 New Patient Pulmonology Office Visit   Subjective:  Patient ID: Heather [name], female    DOB: Apr 07, 1971  MRN: [id number]  Referred by: Shawn Sick, MD  CC:  Chief Complaint  Patient presents with   Consult    States her SOB has gotten worse over the last 3+ months. Pt monitors her oxygen  level at home and states it gets down to the lower 80s with exertion. States she cannot walk from the parking lot to her job without stopping. She said recently her HR was at 156 from her walking across the hallway.     HPI Heather [name] is a 53 y.o. female with stage I adenocarcinoma treated with RUL lobectomy and lymph node dissection in 2018, COPD, MDD, GAD being referred to pulmonary for COPD.  Was following Dr. Alaine from our group.  Was initially on Spiriva .  Was stopped and started on Breztri .  Was asked to continue with as needed albuterol . Taking 2-3 times/week of albuterol .  12th Sept went to ED for SOB. No wheezing then. Did not give steroids. Worse cough and greenish phlegm last 2-3 weeks. Still feeling same.  No prior prednisone  use for breathing.   RLS: was on remeron  which was stopped by PCP due to RLS. Every night. Leads to difficutly falling asleep.   Anxiety and depression: not well controlled. But no SI.   Lung Health: Functional status: 1/2 block.  Covid vaccine: 1 Influenza vaccine: will take it at hospital.  Pneumonococcal vaccine: taken through PCP.  Smoking: max 1 1/2 pk x 40 years. Now down to 1/2 pk mostly.  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 No prior exposures.   Chest x-ray September 2025 reviewed by me unremarkable.  Chronic hyperinflation with flattening of bilateral diaphragm. CT chest November 2023: Reviewed by me: Bilateral scarring in the upper lobes with underlying emphysematous changes.  Known scar in right upper lobe from prior lobectomy. PFT 2022: Mild COPD with increased RV/TLC ratio suggesting air trapping.  Moderate  reduction in DLCO. Echo 2022 LVEF 67%.  Normal diastolic function.  Mild TR  Allergies: Iohexol  and Aspirin   Current Outpatient Medications:    albuterol  (VENTOLIN  HFA) 108 (90 Base) MCG/ACT inhaler, Inhale 2 puffs into the lungs every 6 (six) hours as needed for wheezing or shortness of breath., Disp: 6.7 g, Rfl: 2   budesonide -glycopyrrolate -formoterol  (BREZTRI  AEROSPHERE) 160-9-4.8 MCG/ACT AERO inhaler, Inhale 2 puffs into the lungs in the morning and at bedtime., Disp: 10.7 g, Rfl: 5   buPROPion  (WELLBUTRIN  XL) 300 MG 24 hr tablet, Take 1 tablet (300 mg total) by mouth every morning., Disp: 30 tablet, Rfl: 5   cetirizine  (ZYRTEC  ALLERGY) 10 MG tablet, Take 1 tablet (10 mg total) by mouth daily., Disp: 100 tablet, Rfl: 3   nicotine  (NICODERM CQ ) 14 mg/24hr patch, Place 1 patch (14 mg total) onto the skin daily., Disp: 28 patch, Rfl: 0   nicotine  (NICODERM CQ ) 7 mg/24hr patch, Place 1 patch (7 mg total) onto the skin daily., Disp: 28 patch, Rfl: 0   predniSONE  (DELTASONE ) 20 MG tablet, Take 2 tablets (40 mg total) by mouth daily with breakfast., Disp: 10 tablet, Rfl: 0   SUMAtriptan  (IMITREX ) 25 MG tablet, Take 1 tablet (25 mg total) by mouth once as needed for up to 1 dose for migraine. May repeat in 2 hours if headache persists or recurs. Do NOT exceed more than 2 doses in 24 hours., Disp: 12 tablet, Rfl: 1   traZODone  (DESYREL ) 50  MG tablet, Take 1 tablet (50 mg total) by mouth daily as needed for up to 14 days for sleep., Disp: 14 tablet, Rfl: 1   varenicline  (CHANTIX ) 1 MG tablet, Take 1 tablet (1 mg total) by mouth 2 (two) times daily., Disp: 60 tablet, Rfl: 1   Varenicline  Tartrate, Starter, 0.5 MG X 11 & 1 MG X 42 TBPK, Take 0.5mg  tablet by mouth once daily on days 1 to 3. Then take 0.5mg  tablet twice daily on days 4 to 7. On day 8 and thereafter, take 1mg  tablet by mouth twice daily., Disp: 53 each, Rfl: 0   nitroGLYCERIN  (NITROSTAT ) 0.4 MG SL tablet, Place 1 tablet (0.4 mg total) under  the tongue every 5 (five) minutes as needed for chest pain. (Patient not taking: Reported on 06/19/2024), Disp: 25 tablet, Rfl: 1 Past Medical History:  Diagnosis Date   Blood dyscrasia    COPD (chronic obstructive pulmonary disease) (HCC)    Current smoker    Difficult intubation    [facility]   Factor V deficiency (HCC)    protein deficiency   H/O drug rash due to contrast  05/14/2014   Headache    weekly (09/04/2017)   Heart murmur    Hypokalemia 10/15/2017   Lung adenocarcinoma dx'd 08/2017   s/p RU lobectomy   Pleuritic chest pain 06/13/2018   Pre-diabetes    Pulmonary nodule 07/16/2017   Rapid resting heart rate    happens infrequently   Rib pain on right side 12/21/2017   Past Surgical History:  Procedure Laterality Date   BIOPSY  04/25/2021   Procedure: BIOPSY;  Surgeon: Legrand Victory LITTIE DOUGLAS, MD;  Location: [facility];  Service: Gastroenterology;;  Egd and COLON   CESAREAN SECTION  1992; 2012   COLONOSCOPY N/A 05/15/2014   Procedure: COLONOSCOPY;  Surgeon: Princella CHRISTELLA Nida, MD;  Location: [facility];  Service: Endoscopy;  Laterality: N/A;   COLONOSCOPY WITH PROPOFOL  N/A 04/25/2021   Procedure: COLONOSCOPY WITH PROPOFOL ;  Surgeon: Legrand Victory LITTIE DOUGLAS, MD;  Location: [facility];  Service: Gastroenterology;  Laterality: N/A;   ESOPHAGOGASTRODUODENOSCOPY N/A 05/15/2014   Procedure: ESOPHAGOGASTRODUODENOSCOPY (EGD);  Surgeon: Princella CHRISTELLA Nida, MD;  Location: [facility];  Service: Endoscopy;  Laterality: N/A;   ESOPHAGOGASTRODUODENOSCOPY (EGD) WITH PROPOFOL  N/A 04/25/2021   Procedure: ESOPHAGOGASTRODUODENOSCOPY (EGD) WITH PROPOFOL ;  Surgeon: Legrand Victory LITTIE DOUGLAS, MD;  Location: [facility];  Service: Gastroenterology;  Laterality: N/A;   LAPAROSCOPY  07/07/2011   Procedure: LAPAROSCOPY DIAGNOSTIC;  Surgeon: Robbi JONELLE Render;  Location: [facility];  Service: Gynecology;  Laterality: N/A;   LESION REMOVAL  07/07/2011   Procedure: EXCISION VAGINAL  LESION;  Surgeon: Robbi JONELLE Render;  Location: [facility];  Service: Gynecology;  Laterality: N/A;   LOBECTOMY Right 08/20/2017   Procedure: RIGHT UPPER LOBE LOBECTOMY;  Surgeon: Kerrin Elspeth BROCKS, MD;  Location: [facility];  Service: Thoracic;  Laterality: Right;   LYMPH NODE DISSECTION N/A 08/20/2017   Procedure: LYMPH NODE DISSECTION;  Surgeon: Kerrin Elspeth BROCKS, MD;  Location[facility];  Service: Thoracic;  Laterality: N/A;   POLYPECTOMY  04/25/2021   Procedure: POLYPECTOMY;  Surgeon: Legrand Victory LITTIE DOUGLAS, MD;  Location: [facility];  Service: Gastroenterology;;   VAGINAL HYSTERECTOMY  ~ 2014   VIDEO ASSISTED THORACOSCOPY (VATS)/WEDGE RESECTION Right 08/20/2017   Procedure: RIGHT VIDEO ASSISTED THORACOSCOPY (VATS)/WEDGE RESECTION;  Surgeon: Kerrin Elspeth BROCKS, MD;  Location: [facility];  Service: Thoracic;  Laterality: Right;   Family History  Problem Relation Age of Onset   Heart disease Mother    Heart disease Father    Colon polyps Father    Breast cancer Sister 78   Colon cancer Paternal Uncle    Colon polyps Paternal Uncle    Stomach cancer Maternal Grandmother    Social History   Socioeconomic History   Marital status: Divorced    Spouse name: Not on file   Number of children: 2   Years of education: Not on file   Highest education level: Not on file  Occupational History   Not on file  Tobacco Use   Smoking status: Some Days    Current packs/day: 2.00    Average packs/day: 2.0 packs/day for 41.7 years (83.4 ttl pk-yrs)    Types: Cigarettes    Start date: 1984   Smokeless tobacco: Never   Tobacco comments:    Has smoked for about 40 years. Smokes about 1/2 ppd.   Vaping Use   Vaping status: Never Used  Substance and Sexual Activity   Alcohol use: Not Currently    Alcohol/week: 0.0 standard drinks of alcohol    Comment: 09/04/2017 a mixed drink a few times/year   Drug use: No   Sexual activity: Not Currently  Other Topics Concern   Not on file  Social History  Narrative   Not on file   Social Drivers of Health   Financial Resource Strain: Medium Risk (09/04/2017)   Overall Financial Resource Strain (CARDIA)    Difficulty of Paying Living Expenses: Somewhat hard  Food Insecurity: No Food Insecurity (09/04/2017)   Hunger Vital Sign    Worried About Running Out of Food in the Last Year: Never true    Ran Out of Food in the Last Year: Never true  Transportation Needs: No Transportation Needs (09/04/2017)   PRAPARE - Administrator, Civil Service (Medical): No    Lack of Transportation (Non-Medical): No  Physical Activity: Sufficiently Active (09/04/2017)   Exercise Vital Sign    Days of Exercise per Week: 3 days    Minutes of Exercise per Session: 60 min  Stress: Stress Concern Present (09/04/2017)   [employer] of Occupational Health - Occupational Stress Questionnaire    Feeling of Stress : Very much  Social Connections: Moderately Isolated (09/04/2017)   Social Connection and Isolation Panel    Frequency of Communication with Friends and Family: Once a week    Frequency of Social Gatherings with Friends and Family: More than three times a week    Attends Religious Services: Never    Database Administrator or Organizations: No    Attends Banker Meetings: Never    Marital Status: Divorced  Catering Manager Violence: Not At Risk (09/04/2017)   Humiliation, Afraid, Rape, and Kick questionnaire    Fear of Current or Ex-Partner: No    Emotionally Abused: No    Physically Abused: No    Sexually Abused: No       Objective:  BP 134/84   Pulse 93   Ht 5' 3 (1.6 m)   Wt 100 lb 3.2 oz (45.5 kg)   SpO2 99% Comment: RA  BMI 17.75 kg/m  BMI Readings from Last 3 Encounters:  06/19/24 17.75 kg/m  06/18/24 17.71 kg/m  06/13/24 18.90 kg/m    General:not in distress.  Lungs: wheezing b/l.  Heart: regular rate rhythm, no murmur appreciated.  Abdomen: non tender, non distended. Normal BS.  Neuro: axox3.  Move  all extremities.  Diagnostic Review:  Last metabolic panel Lab Results  Component Value Date   GLUCOSE 102 (H) 06/13/2024   NA 142 06/13/2024   K 3.8 06/13/2024   CL 107 06/13/2024   CO2 21 (L) 06/13/2024   BUN 13 06/13/2024   CREATININE 0.80 06/13/2024   GFRNONAA >60 06/13/2024   CALCIUM 9.5 06/13/2024   PROT 7.3 06/13/2024   ALBUMIN  4.6 06/13/2024   LABGLOB 2.3 02/15/2021   AGRATIO 2.1 02/15/2021   BILITOT 0.6 06/13/2024   ALKPHOS 51 06/13/2024   AST 16 06/13/2024   ALT 10 06/13/2024   ANIONGAP 14 06/13/2024       Assessment & Plan:   Assessment & Plan Centrilobular emphysema (HCC)  COPD with acute exacerbation (HCC) Currently in exacerbation. Will give prednisone  for 5 days.  - cont breztri  and prn albuterol .  - PFTs.  Encounter for smoking cessation counseling Trying to quit. Now down to 2-3 cig/d.  Wants to try chantix . Discussed prior black box warning of depression. No SI.  Prescribed chantix  and nicotine  patch.  Smoking/Tobacco Cessation Counseling Jazzmine Kleiman is a current user of tobacco or nicotine  products. She is ready to quit at this time. Counseling provided today addressed the risks of continued use and the benefits of cessation. Discussed tobacco/nicotine  use history, readiness to quit, and evidence-based treatment options including behavioral strategies, support resources, and pharmacologic therapies. Provided encouragement and educational materials on steps and resources to quit smoking. Patient questions were addressed, and follow-up recommended for continued support. Total time spent on counseling: 4 minutes.   RLS (restless legs syndrome) Affecting her sleep.  - iron  studies. If unable to give iron  replacement may try gabapentin.  Anxiety and depression On trazodone . Plan to go on SSRI by PCP. SSRI will worsen RLS symptoms.  Usually trazodone  and Wellbutrin  are safe options for depression and buspirone for anxiety. Encounter for screening  for lung cancer - LDCT ordered.   Orders Placed This Encounter  Procedures   CT CHEST LUNG CANCER SCREENING LOW DOSE WO CONTRAST   Iron , TIBC and Ferritin Panel   Pulmonary Function Test      Return in about 4 weeks (around 07/17/2024).   Yaneliz Radebaugh, MD

## 2024-06-19 NOTE — Patient Instructions (Addendum)
 Notification of test results are managed in the following manner: If there are any recommendations or changes to the plan of care discussed in office today, we will contact you and let you know what they are. If you do not hear from us , then your results are normal/expected and you can view them through your MyChart account, or a letter will be sent to you. Thank you again for trusting us  with your care [facility].  Please call [phone] for smoking cessation help.

## 2024-06-20 ENCOUNTER — Ambulatory Visit: Payer: Self-pay

## 2024-06-20 DIAGNOSIS — Z122 Encounter for screening for malignant neoplasm of respiratory organs: Secondary | ICD-10-CM

## 2024-06-20 LAB — IRON,TIBC AND FERRITIN PANEL
%SAT: 27 % (ref 16–45)
Ferritin: 76 ng/mL (ref 16–232)
Iron: 75 ug/dL (ref 45–160)
TIBC: 277 ug/dL (ref 250–450)

## 2024-06-20 NOTE — Progress Notes (Signed)
 Please schedule IV iron infusion (Ferraheme) for total of 1gm in divided doses. Thank you.

## 2024-06-23 ENCOUNTER — Other Ambulatory Visit: Payer: Self-pay

## 2024-06-23 NOTE — Progress Notes (Signed)
 Therapy plan placed for [facility] at [facility] to start benefits investigation.   Patient has history of RLS.    Provider: Dr. Sammi Fredericks   Dose: 510mg  at Day 0 then Day 7 (or after) Premedications: none   Last Clinic Visit:    Pertinent Labs:  06/19/24 Serum ferritin: 76 Transferrin ratio: 27  CBC 06/13/24 Renal function wnl 06/13/24  Aleck Puls, PharmD, BCPS Clinical Pharmacist  [facility]

## 2024-06-24 ENCOUNTER — Telehealth: Payer: Self-pay | Admitting: Pharmacy Technician

## 2024-06-24 NOTE — Telephone Encounter (Signed)
 Dr. Pawar, Allegra will be denied. Feraheme will not be covered for DX code G25.81.  It will only be covered for D50.9 (Anemia and after patient has failed Venofer).  Please advise.  Thanks Luke

## 2024-06-25 ENCOUNTER — Other Ambulatory Visit (HOSPITAL_COMMUNITY): Payer: Self-pay

## 2024-06-25 DIAGNOSIS — D509 Iron deficiency anemia, unspecified: Secondary | ICD-10-CM | POA: Insufficient documentation

## 2024-06-25 NOTE — Telephone Encounter (Signed)
 Thanks - we will schedule patient as soon as possible

## 2024-06-29 ENCOUNTER — Ambulatory Visit (HOSPITAL_BASED_OUTPATIENT_CLINIC_OR_DEPARTMENT_OTHER)
Admission: RE | Admit: 2024-06-29 | Discharge: 2024-06-29 | Disposition: A | Discharge status: Home / Self Care | Source: Ambulatory Visit

## 2024-06-29 DIAGNOSIS — F17210 Nicotine dependence, cigarettes, uncomplicated: Secondary | ICD-10-CM | POA: Diagnosis not present

## 2024-06-29 DIAGNOSIS — Z122 Encounter for screening for malignant neoplasm of respiratory organs: Secondary | ICD-10-CM | POA: Insufficient documentation

## 2024-07-02 ENCOUNTER — Ambulatory Visit

## 2024-07-02 MED ORDER — IRON SUCROSE 20 MG/ML IV SOLN: 200.0000 mg | Freq: Once | INTRAVENOUS | Status: DC

## 2024-07-08 ENCOUNTER — Telehealth: Payer: Self-pay

## 2024-07-08 ENCOUNTER — Encounter: Payer: Self-pay | Admitting: Licensed Clinical Social Worker

## 2024-07-08 ENCOUNTER — Ambulatory Visit

## 2024-07-08 NOTE — Telephone Encounter (Signed)
 Please refer to message below.  Copied from CRM [id number]. Topic: Appointments - Appointment Info/Confirmation >> Jul 08, 2024 10:24 AM Farrel B wrote:  Patient stated she is unable to attend the appt. today and will call back to reschedule. There's been some conflict with work schedule

## 2024-07-16 ENCOUNTER — Ambulatory Visit

## 2024-07-17 ENCOUNTER — Other Ambulatory Visit: Payer: Self-pay

## 2024-07-17 ENCOUNTER — Other Ambulatory Visit (HOSPITAL_COMMUNITY): Payer: Self-pay

## 2024-07-17 ENCOUNTER — Ambulatory Visit (INDEPENDENT_AMBULATORY_CARE_PROVIDER_SITE_OTHER)

## 2024-07-17 VITALS — BP 131/82 | HR 94 | Ht 63.0 in | Wt 100.0 lb

## 2024-07-17 DIAGNOSIS — F32A Depression, unspecified: Secondary | ICD-10-CM | POA: Diagnosis not present

## 2024-07-17 DIAGNOSIS — F419 Anxiety disorder, unspecified: Secondary | ICD-10-CM

## 2024-07-17 DIAGNOSIS — Z122 Encounter for screening for malignant neoplasm of respiratory organs: Secondary | ICD-10-CM

## 2024-07-17 DIAGNOSIS — G2581 Restless legs syndrome: Secondary | ICD-10-CM | POA: Diagnosis not present

## 2024-07-17 DIAGNOSIS — J441 Chronic obstructive pulmonary disease with (acute) exacerbation: Secondary | ICD-10-CM

## 2024-07-17 DIAGNOSIS — F17210 Nicotine dependence, cigarettes, uncomplicated: Secondary | ICD-10-CM

## 2024-07-17 DIAGNOSIS — Z716 Tobacco abuse counseling: Secondary | ICD-10-CM

## 2024-07-17 DIAGNOSIS — J432 Centrilobular emphysema: Secondary | ICD-10-CM | POA: Diagnosis not present

## 2024-07-17 LAB — PULMONARY FUNCTION TEST
DL/VA % pred: 74 %
DL/VA: 3.22 ml/min/mmHg/L
DLCO cor % pred: 67 %
DLCO cor: 13.51 ml/min/mmHg
DLCO unc % pred: 68 %
DLCO unc: 13.72 ml/min/mmHg
FEF 25-75 Post: 2.22 L/s
FEF 25-75 Pre: 1.88 L/s
FEF2575-%Change-Post: 17 %
FEF2575-%Pred-Post: 87 %
FEF2575-%Pred-Pre: 74 %
FEV1-%Change-Post: 4 %
FEV1-%Pred-Post: 87 %
FEV1-%Pred-Pre: 83 %
FEV1-Post: 2.29 L
FEV1-Pre: 2.18 L
FEV1FVC-%Change-Post: 6 %
FEV1FVC-%Pred-Pre: 96 %
FEV6-%Change-Post: -1 %
FEV6-%Pred-Post: 86 %
FEV6-%Pred-Pre: 87 %
FEV6-Post: 2.79 L
FEV6-Pre: 2.83 L
FEV6FVC-%Pred-Post: 103 %
FEV6FVC-%Pred-Pre: 103 %
FVC-%Change-Post: -1 %
FVC-%Pred-Post: 83 %
FVC-%Pred-Pre: 85 %
FVC-Post: 2.79 L
FVC-Pre: 2.83 L
Post FEV1/FVC ratio: 82 %
Post FEV6/FVC ratio: 100 %
Pre FEV1/FVC ratio: 77 %
Pre FEV6/FVC Ratio: 100 %
RV % pred: 128 %
RV: 2.33 L
TLC % pred: 102 %
TLC: 5.04 L

## 2024-07-17 MED ORDER — ENSIFENTRINE 3 MG/2.5ML IN SUSP
3.0000 mg | Freq: Two times a day (BID) | RESPIRATORY_TRACT | 3 refills | Status: AC
  Filled 2024-07-17: qty 150, fill #0

## 2024-07-17 NOTE — Assessment & Plan Note (Signed)
 07/04/2024.  Lung RADS 2.  Lung cancer screening referral sent.

## 2024-07-17 NOTE — Assessment & Plan Note (Addendum)
 Discussed PFT w patient.  Continue Breztri  and as needed albuterol . With continued symptoms and multiple exacerbation in past will initiate the patient on Ensifentrine .  Daliresp not an option with already low BMI. Currently does not meet eosinophil criteria for Dupixent. If above does not work may initiate the patient on azithromycin after ruling out sensorineural hearing

## 2024-07-17 NOTE — Progress Notes (Signed)
 Full pft performed today

## 2024-07-17 NOTE — Assessment & Plan Note (Signed)
 Being followed by primary care.  Psychiatry referral sent.

## 2024-07-17 NOTE — Assessment & Plan Note (Deleted)
***   Continue Breztri  and as needed albuterol . PFTs pending.

## 2024-07-17 NOTE — Assessment & Plan Note (Addendum)
 Trying to quit. Now down to 2-3 cig/d.  Wants to try chantix . Discussed prior black box warning of depression. No SI.  Prescribed chantix  and nicotine  patch.  Smoking/Tobacco Cessation Counseling [name] is a current user of tobacco or nicotine  products. She is ready to quit at this time. Counseling provided today addressed the risks of continued use and the benefits of cessation. Discussed tobacco/nicotine  use history, readiness to quit, and evidence-based treatment options including behavioral strategies, support resources, and pharmacologic therapies. Provided encouragement and educational materials on steps and resources to quit smoking. Patient questions were addressed, and follow-up recommended for continued support. Total time spent on counseling: 4 minutes.

## 2024-07-17 NOTE — Progress Notes (Signed)
 New Patient Pulmonology Office Visit   Subjective:  Patient ID: Heather [name], female    DOB: 10/24/1970  MRN: [id number]  Referred by: Theodoro Lakes, MD  CC:  No chief complaint on file.   HPI Heather [name] is a 53 y.o. female with stage I adenocarcinoma treated with RUL lobectomy and lymph node dissection in 2018, COPD, MDD, GAD being referred to pulmonary for COPD.  COPD: Spiriva > Breztri .  Using albuterol  2-3 times a week.  Was in COPD exacerbation on 9/18.  Prescribed steroids.  RLS: was on remeron  which was stopped by PCP due to RLS. Every night. Leads to difficutly falling asleep. Now on trazodone  and wellbutrin . Her RLS has improved being off remeron .   Anxiety and depression: not well controlled. But no SI.   07/17/24: Still SOB on exertion. She used the steroids without much improvement. Still coughing more on exertion, clear-greenish phlegm.  Using breztri  every day.  ED visits 4 times in last 1 year.   Now taking chantix  and on patches. Doing well. Only smoked 2 cig in last few days.   Lung Health: Functional status: 1/2 block.  Covid vaccine: 1 Influenza vaccine: UTD.  Pneumonococcal vaccine: taken through PCP.  Smoking: max 1 1/2 pk x 40 years. Now down to 1/2 pk mostly.  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 No prior exposures.   Chest x-ray September 2025 reviewed by me unremarkable.  Chronic hyperinflation with flattening of bilateral diaphragm. CT chest November 2023: Reviewed by me: Bilateral scarring in the upper lobes with underlying emphysematous changes.  Known scar in right upper lobe from prior lobectomy. LDCT Sept 2025: lung RADS2.  PFT 2022: Mild COPD with increased RV/TLC ratio suggesting air trapping.  Moderate reduction in DLCO. PFT October 2025: Preserved ratio with elevated RV-TLC ratio, mildly reduced DLCO. Echo 2022 LVEF 67%.  Normal diastolic function.  Mild TR  Allergies: Iohexol  and Aspirin   Current  Outpatient Medications:    albuterol  (VENTOLIN  HFA) 108 (90 Base) MCG/ACT inhaler, Inhale 2 puffs into the lungs every 6 (six) hours as needed for wheezing or shortness of breath., Disp: 6.7 g, Rfl: 2   budesonide -glycopyrrolate -formoterol  (BREZTRI  AEROSPHERE) 160-9-4.8 MCG/ACT AERO inhaler, Inhale 2 puffs into the lungs in the morning and at bedtime., Disp: 10.7 g, Rfl: 5   buPROPion  (WELLBUTRIN  XL) 300 MG 24 hr tablet, Take 1 tablet (300 mg total) by mouth every morning., Disp: 30 tablet, Rfl: 5   cetirizine  (ZYRTEC  ALLERGY) 10 MG tablet, Take 1 tablet (10 mg total) by mouth daily., Disp: 100 tablet, Rfl: 3   Ensifentrine  3 MG/2.5ML SUSP, Inhale 3 mg into the lungs in the morning and at bedtime., Disp: 150 mL, Rfl: 3   nicotine  (NICODERM CQ ) 14 mg/24hr patch, Place 1 patch (14 mg total) onto the skin daily., Disp: 28 patch, Rfl: 0   nicotine  (NICODERM CQ ) 7 mg/24hr patch, Place 1 patch (7 mg total) onto the skin daily., Disp: 28 patch, Rfl: 0   nitroGLYCERIN  (NITROSTAT ) 0.4 MG SL tablet, Place 1 tablet (0.4 mg total) under the tongue every 5 (five) minutes as needed for chest pain., Disp: 25 tablet, Rfl: 1   SUMAtriptan  (IMITREX ) 25 MG tablet, Take 1 tablet (25 mg total) by mouth once as needed for up to 1 dose for migraine. May repeat in 2 hours if headache persists or recurs. Do NOT exceed more than 2 doses in 24 hours., Disp: 12 tablet, Rfl: 1   traZODone  (DESYREL ) 50 MG tablet,  Take 1 tablet (50 mg total) by mouth daily as needed for up to 14 days for sleep., Disp: 14 tablet, Rfl: 1   varenicline  (CHANTIX ) 1 MG tablet, Take 1 tablet (1 mg total) by mouth 2 (two) times daily., Disp: 60 tablet, Rfl: 1   Varenicline  Tartrate, Starter, 0.5 MG X 11 & 1 MG X 42 TBPK, Take 0.5mg  tablet by mouth once daily on days 1 to 3. Then take 0.5mg  tablet twice daily on days 4 to 7. On day 8 and thereafter, take 1mg  tablet by mouth twice daily., Disp: 53 each, Rfl: 0 Past Medical History:  Diagnosis Date   Blood  dyscrasia    COPD (chronic obstructive pulmonary disease) (HCC)    Current smoker    Difficult intubation    pt was told difficult intubation at [facility]   Factor V deficiency (HCC)    protein deficiency   H/O drug rash due to contrast  05/14/2014   Headache    weekly (09/04/2017)   Heart murmur    Hypokalemia 10/15/2017   Lung adenocarcinoma dx'd 08/2017   s/p RU lobectomy   Pleuritic chest pain 06/13/2018   Pre-diabetes    Pulmonary nodule 07/16/2017   Rapid resting heart rate    happens infrequently   Rib pain on right side 12/21/2017   Past Surgical History:  Procedure Laterality Date   BIOPSY  04/25/2021   Procedure: BIOPSY;  Surgeon: Legrand Victory LITTIE DOUGLAS, MD;  Location: [facility];  Service: Gastroenterology;;  Egd and COLON   CESAREAN SECTION  1992; 2012   COLONOSCOPY N/A 05/15/2014   Procedure: COLONOSCOPY;  Surgeon: Princella CHRISTELLA Nida, MD;  Location: [facility];  Service: Endoscopy;  Laterality: N/A;   COLONOSCOPY WITH PROPOFOL  N/A 04/25/2021   Procedure: COLONOSCOPY WITH PROPOFOL ;  Surgeon: Legrand Victory LITTIE DOUGLAS, MD;  Location: [facility];  Service: Gastroenterology;  Laterality: N/A;   ESOPHAGOGASTRODUODENOSCOPY N/A 05/15/2014   Procedure: ESOPHAGOGASTRODUODENOSCOPY (EGD);  Surgeon: Princella CHRISTELLA Nida, MD;  Location: [facility];  Service: Endoscopy;  Laterality: N/A;   ESOPHAGOGASTRODUODENOSCOPY (EGD) WITH PROPOFOL  N/A 04/25/2021   Procedure: ESOPHAGOGASTRODUODENOSCOPY (EGD) WITH PROPOFOL ;  Surgeon: Legrand Victory LITTIE DOUGLAS, MD;  Location: [facility];  Service: Gastroenterology;  Laterality: N/A;   LAPAROSCOPY  07/07/2011   Procedure: LAPAROSCOPY DIAGNOSTIC;  Surgeon: Robbi JONELLE Render;  Location: [facility];  Service: Gynecology;  Laterality: N/A;   LESION REMOVAL  07/07/2011   Procedure: EXCISION VAGINAL LESION;  Surgeon: Robbi JONELLE Render;  Location: [facility];  Service: Gynecology;  Laterality: N/A;   LOBECTOMY Right 08/20/2017   Procedure: RIGHT UPPER LOBE LOBECTOMY;  Surgeon:  Kerrin Elspeth BROCKS, MD;  Location[facility];  Service: Thoracic;  Laterality: Right;   LYMPH NODE DISSECTION N/A 08/20/2017   Procedure: LYMPH NODE DISSECTION;  Surgeon: Kerrin Elspeth BROCKS, MD;  Location: [facility] OR;  Service: Thoracic;  Laterality: N/A;   POLYPECTOMY  04/25/2021   Procedure: POLYPECTOMY;  Surgeon: Legrand Victory LITTIE DOUGLAS, MD;  Location: [facility];  Service: Gastroenterology;;   VAGINAL HYSTERECTOMY  ~ 2014   VIDEO ASSISTED THORACOSCOPY (VATS)/WEDGE RESECTION Right 08/20/2017   Procedure: RIGHT VIDEO ASSISTED THORACOSCOPY (VATS)/WEDGE RESECTION;  Surgeon: Kerrin Elspeth BROCKS, MD;  Location: [facility];  Service: Thoracic;  Laterality: Right;   Family History  Problem Relation Age of Onset   Heart disease Mother    Heart disease Father    Colon polyps Father    Breast cancer Sister 29   Colon cancer Paternal Uncle    Colon  polyps Paternal Uncle    Stomach cancer Maternal Grandmother    Social History   Socioeconomic History   Marital status: Divorced    Spouse name: Not on file   Number of children: 2   Years of education: Not on file   Highest education level: Not on file  Occupational History   Not on file  Tobacco Use   Smoking status: Some Days    Current packs/day: 2.00    Average packs/day: 2.0 packs/day for 41.8 years (83.6 ttl pk-yrs)    Types: Cigarettes    Start date: 1984   Smokeless tobacco: Never   Tobacco comments:    Has smoked for about 40 years. Smokes about 1/2 ppd.   Vaping Use   Vaping status: Never Used  Substance and Sexual Activity   Alcohol use: Not Currently    Alcohol/week: 0.0 standard drinks of alcohol    Comment: 09/04/2017 a mixed drink a few times/year   Drug use: No   Sexual activity: Not Currently  Other Topics Concern   Not on file  Social History Narrative   Not on file   Social Drivers of Health   Financial Resource Strain: Medium Risk (09/04/2017)   Overall Financial Resource Strain (CARDIA)    Difficulty of Paying  Living Expenses: Somewhat hard  Food Insecurity: No Food Insecurity (09/04/2017)   Hunger Vital Sign    Worried About Running Out of Food in the Last Year: Never true    Ran Out of Food in the Last Year: Never true  Transportation Needs: No Transportation Needs (09/04/2017)   PRAPARE - Administrator, Civil Service (Medical): No    Lack of Transportation (Non-Medical): No  Physical Activity: Sufficiently Active (09/04/2017)   Exercise Vital Sign    Days of Exercise per Week: 3 days    Minutes of Exercise per Session: 60 min  Stress: Stress Concern Present (09/04/2017)   [employer] of Occupational Health - Occupational Stress Questionnaire    Feeling of Stress : Very much  Social Connections: Moderately Isolated (09/04/2017)   Social Connection and Isolation Panel    Frequency of Communication with Friends and Family: Once a week    Frequency of Social Gatherings with Friends and Family: More than three times a week    Attends Religious Services: Never    Database administrator or Organizations: No    Attends Banker Meetings: Never    Marital Status: Divorced  Catering manager Violence: Not At Risk (09/04/2017)   Humiliation, Afraid, Rape, and Kick questionnaire    Fear of Current or Ex-Partner: No    Emotionally Abused: No    Physically Abused: No    Sexually Abused: No       Objective:  BP 131/82   Pulse 94   Ht 5' 3 (1.6 m)   Wt 100 lb (45.4 kg)   SpO2 97%   BMI 17.71 kg/m  BMI Readings from Last 3 Encounters:  07/17/24 17.71 kg/m  06/19/24 17.75 kg/m  06/18/24 17.71 kg/m    General:not in distress.  Lungs: wheezing b/l.  Heart: regular rate rhythm, no murmur appreciated.  Abdomen: non tender, non distended. Normal BS.  Neuro: axox3.  Move all extremities.    Diagnostic Review:  Last metabolic panel Lab Results  Component Value Date   GLUCOSE 102 (H) 06/13/2024   NA 142 06/13/2024   K 3.8 06/13/2024   CL 107 06/13/2024    CO2 21 (L) 06/13/2024  BUN 13 06/13/2024   CREATININE 0.80 06/13/2024   GFRNONAA >60 06/13/2024   CALCIUM 9.5 06/13/2024   PROT 7.3 06/13/2024   ALBUMIN  4.6 06/13/2024   LABGLOB 2.3 02/15/2021   AGRATIO 2.1 02/15/2021   BILITOT 0.6 06/13/2024   ALKPHOS 51 06/13/2024   AST 16 06/13/2024   ALT 10 06/13/2024   ANIONGAP 14 06/13/2024       Assessment & Plan:   Assessment & Plan Centrilobular emphysema (HCC) Discussed PFT w patient.  Continue Breztri  and as needed albuterol . With continued symptoms and multiple exacerbation in past will initiate the patient on Ensifentrine .  Daliresp not an option with already low BMI. Currently does not meet eosinophil criteria for Dupixent. If above does not work may initiate the patient on azithromycin after ruling out sensorineural hearing Encounter for smoking cessation counseling Doing well on Chantix  and nicotine  patch.  Reduced urge to smoke.  Smoking/Tobacco Cessation Counseling Heather [name] is a current user of tobacco or nicotine  products. She is ready to quit at this time. Counseling provided today addressed the risks of continued use and the benefits of cessation. Discussed tobacco/nicotine  use history, readiness to quit, and evidence-based treatment options including behavioral strategies, support resources, and pharmacologic therapies. Provided encouragement and educational materials on steps and resources to quit smoking. Patient questions were addressed, and follow-up recommended for continued support. Total time spent on counseling: 7 minutes.   RLS (restless legs syndrome) Ferritin 76 with saturation of 27.  Venofer was ordered but not administered.  Patient's RLS symptoms have improved being off Remeron .  Continue to monitor and patient currently does not need for infusion. Anxiety and depression Being followed by primary care.  Psychiatry referral sent. Encounter for screening for lung cancer 07/04/2024.  Lung RADS 2.  Lung  cancer screening referral sent.  No orders of the defined types were placed in this encounter.   Return in about 3 months (around 10/17/2024).   I personally spent a total of 30 minutes in the care of the patient today including preparing to see the patient, getting/reviewing separately obtained history, performing a medically appropriate exam/evaluation, counseling and educating, placing orders, and independently interpreting results.   Avyn Coate, MD

## 2024-07-17 NOTE — Assessment & Plan Note (Addendum)
 Doing well on Chantix  and nicotine  patch.  Reduced urge to smoke.  Smoking/Tobacco Cessation Counseling Heather [name] is a current user of tobacco or nicotine  products. She is ready to quit at this time. Counseling provided today addressed the risks of continued use and the benefits of cessation. Discussed tobacco/nicotine  use history, readiness to quit, and evidence-based treatment options including behavioral strategies, support resources, and pharmacologic therapies. Provided encouragement and educational materials on steps and resources to quit smoking. Patient questions were addressed, and follow-up recommended for continued support. Total time spent on counseling: 7 minutes. {< 3 minutes is not billable, 3-10 minutes enter CPT 99406, > 10 minutes enter CPT 99407 - THIS TEXT WILL DISAPPEAR WHEN YOU SIGN THE ENCOUNTER Click to go to Charge Capture:1}

## 2024-07-17 NOTE — Patient Instructions (Addendum)
 Heather [name]

## 2024-07-17 NOTE — Assessment & Plan Note (Addendum)
 Ferritin 76 with saturation of 27.  Venofer was ordered but not administered.  Patient's RLS symptoms have improved being off Remeron .  Continue to monitor and patient currently does not need for infusion.

## 2024-07-17 NOTE — Patient Instructions (Signed)
 Full pft performed today

## 2024-07-18 ENCOUNTER — Telehealth: Payer: Self-pay

## 2024-07-18 NOTE — Progress Notes (Signed)
 Discontinued therapy plan as no longer needed per CC chart message from Dr. Pawar.

## 2024-07-18 NOTE — Addendum Note (Signed)
 Addended by: Zale Marcotte L on: 07/18/2024 03:08 PM   Modules accepted: Orders

## 2024-07-21 ENCOUNTER — Telehealth: Payer: Self-pay

## 2024-07-21 NOTE — Telephone Encounter (Signed)
 [place]  paperwork mailed to pt address on file.

## 2024-07-21 NOTE — Telephone Encounter (Signed)
 ATC x1. LDVM (DPR) explaining I am mailing paperwork to complete to address on file & to mail back to the office or drop off.  Also sending Mychart message.

## 2024-07-22 ENCOUNTER — Ambulatory Visit

## 2024-07-22 ENCOUNTER — Other Ambulatory Visit (HOSPITAL_COMMUNITY): Payer: Self-pay

## 2024-07-25 ENCOUNTER — Other Ambulatory Visit (HOSPITAL_COMMUNITY): Payer: Self-pay

## 2024-07-29 ENCOUNTER — Ambulatory Visit

## 2024-08-01 ENCOUNTER — Other Ambulatory Visit (HOSPITAL_COMMUNITY): Payer: Self-pay

## 2024-08-06 ENCOUNTER — Ambulatory Visit: Attending: Cardiology | Admitting: Cardiology

## 2024-08-06 ENCOUNTER — Ambulatory Visit

## 2024-08-06 ENCOUNTER — Encounter: Payer: Self-pay | Admitting: Cardiology

## 2024-08-06 ENCOUNTER — Other Ambulatory Visit (HOSPITAL_COMMUNITY): Payer: Self-pay

## 2024-08-06 VITALS — BP 118/78 | HR 81 | Temp 97.8°F | Ht 63.0 in | Wt 101.2 lb

## 2024-08-06 VITALS — BP 104/68 | HR 72 | Ht 63.0 in | Wt 101.0 lb

## 2024-08-06 DIAGNOSIS — Z79899 Other long term (current) drug therapy: Secondary | ICD-10-CM

## 2024-08-06 DIAGNOSIS — G4700 Insomnia, unspecified: Secondary | ICD-10-CM | POA: Diagnosis not present

## 2024-08-06 DIAGNOSIS — Z8249 Family history of ischemic heart disease and other diseases of the circulatory system: Secondary | ICD-10-CM

## 2024-08-06 DIAGNOSIS — F419 Anxiety disorder, unspecified: Secondary | ICD-10-CM

## 2024-08-06 DIAGNOSIS — F32A Depression, unspecified: Secondary | ICD-10-CM

## 2024-08-06 DIAGNOSIS — G43109 Migraine with aura, not intractable, without status migrainosus: Secondary | ICD-10-CM

## 2024-08-06 DIAGNOSIS — F17210 Nicotine dependence, cigarettes, uncomplicated: Secondary | ICD-10-CM | POA: Diagnosis not present

## 2024-08-06 DIAGNOSIS — Z716 Tobacco abuse counseling: Secondary | ICD-10-CM | POA: Diagnosis not present

## 2024-08-06 DIAGNOSIS — R072 Precordial pain: Secondary | ICD-10-CM | POA: Diagnosis not present

## 2024-08-06 MED ORDER — SUMATRIPTAN SUCCINATE 25 MG PO TABS
25.0000 mg | ORAL_TABLET | Freq: Once | ORAL | 1 refills | Status: AC | PRN
  Filled 2024-08-06: qty 9, 30d supply, fill #0

## 2024-08-06 MED ORDER — MELATONIN 5 MG PO TABS
5.0000 mg | ORAL_TABLET | Freq: Every evening | ORAL | 1 refills | Status: AC
  Filled 2024-08-06: qty 90, 90d supply, fill #0
  Filled 2024-10-30: qty 90, 90d supply, fill #1

## 2024-08-06 MED ORDER — TRAZODONE HCL 50 MG PO TABS
50.0000 mg | ORAL_TABLET | Freq: Every day | ORAL | 0 refills | Status: AC | PRN
  Filled 2024-08-06: qty 28, 28d supply, fill #0

## 2024-08-06 MED ORDER — NITROGLYCERIN 0.4 MG SL SUBL
0.4000 mg | SUBLINGUAL_TABLET | SUBLINGUAL | 2 refills | Status: AC | PRN
  Filled 2024-08-06: qty 25, 15d supply, fill #0

## 2024-08-06 MED ORDER — BUPROPION HCL ER (XL) 150 MG PO TB24
450.0000 mg | ORAL_TABLET | Freq: Every day | ORAL | 5 refills | Status: AC
  Filled 2024-08-06: qty 90, 30d supply, fill #0
  Filled 2024-09-30 – 2024-10-30 (×2): qty 90, 30d supply, fill #1

## 2024-08-06 NOTE — Patient Instructions (Signed)
 Thank you for coming in today. If you have any questions or concerns, please feel free to contact me via MyChart or call the office.   If you have gotten labs today, I will be in touch via MyChart or telephone.   Have a great day.

## 2024-08-06 NOTE — Progress Notes (Signed)
 Subjective:   Patient ID: Heather [name] female   DOB: [birth year] 53 y.o.   MRN: [id number]  HPI: Heather [name] is a 53 y.o. F Pmhx COPD, MDD, GAD, tobacco use disorder presenting for follow up.   MDD/GAD - on wellbutrin  300 mg with partial relief. Her main symptom is depressed mood rather than anxiety. She clarifies that she did not lose her sister, rather her sister made an attempt on her life that was fortunately unsuccessful. Her RLS symptoms have resolved w/ discontinuation of Remeron . She has not noticed any changes to her mood symptoms w/ Remeron  discontinuation. Trazodone  helps with her sleep, she is taking it nightly with good effect.  Migraines - has a hx of migraine HA w/ associated visual aura, N/V. MRI 2021 wnl. Was previously on sumatriptan  25 mg PRN with good relief however she notes that her headaches are increasing in frequency. She notes that the headaches are similar to her previous migraines, associated with visual and sometimes tactile aura 'feeling of water being poured on her head' and are successfully aborted with sumatriptan . She has been having these headaches several times a week. She has not seen a neurologist.   Tobacco use disorder - down to a few cigarettes a week, declines NRT. Chantix  unfortunately causes nausea and so she is unable to take this regularly. Wellbutrin  helps.  Past Medical History:  Diagnosis Date   Blood dyscrasia    COPD (chronic obstructive pulmonary disease) (HCC)    Current smoker    Difficult intubation    pt was told [facility] int[facility] at [facility]   Factor V deficiency (HCC)    protein deficiency   H/O drug rash due to contrast  05/14/2014   Headache    weekly (09/04/2017)   Heart murmur    Hypokalemia 10/15/2017   Lung adenocarcinoma dx'd 08/2017   s/p RU lobectomy   Pleuritic chest pain 06/13/2018   Pre-diabetes    Pulmonary nodule 07/16/2017   Rapid resting heart rate    happens infrequently   Rib pain on  right side 12/21/2017   Current Outpatient Medications  Medication Sig Dispense Refill   albuterol  (VENTOLIN  HFA) 108 (90 Base) MCG/ACT inhaler Inhale 2 puffs into the lungs every 6 (six) hours as needed for wheezing or shortness of breath. 6.7 g 2   budesonide -glycopyrrolate -formoterol  (BREZTRI  AEROSPHERE) 160-9-4.8 MCG/ACT AERO inhaler Inhale 2 puffs into the lungs in the morning and at bedtime. 10.7 g 5   buPROPion  (WELLBUTRIN  XL) 300 MG 24 hr tablet Take 1 tablet (300 mg total) by mouth every morning. 30 tablet 5   cetirizine  (ZYRTEC  ALLERGY) 10 MG tablet Take 1 tablet (10 mg total) by mouth daily. 100 tablet 3   Ensifentrine  3 MG/2.5ML SUSP Inhale 3 mg into the lungs in the morning and at bedtime. 150 mL 3   nicotine  (NICODERM CQ ) 14 mg/24hr patch Place 1 patch (14 mg total) onto the skin daily. 28 patch 0   nicotine  (NICODERM CQ ) 7 mg/24hr patch Place 1 patch (7 mg total) onto the skin daily. 28 patch 0   nitroGLYCERIN  (NITROSTAT ) 0.4 MG SL tablet Place 1 tablet (0.4 mg total) under the tongue every 5 (five) minutes as needed for chest pain. 25 tablet 1   SUMAtriptan  (IMITREX ) 25 MG tablet Take 1 tablet (25 mg total) by mouth once as needed for up to 1 dose for migraine. May repeat in 2 hours if headache persists or recurs. Do NOT exceed more than 2 doses in  24 hours. 12 tablet 1   traZODone  (DESYREL ) 50 MG tablet Take 1 tablet (50 mg total) by mouth daily as needed for up to 14 days for sleep. 14 tablet 1   varenicline  (CHANTIX ) 1 MG tablet Take 1 tablet (1 mg total) by mouth 2 (two) times daily. 60 tablet 1   Varenicline  Tartrate, Starter, 0.5 MG X 11 & 1 MG X 42 TBPK Take 0.5mg  tablet by mouth once daily on days 1 to 3. Then take 0.5mg  tablet twice daily on days 4 to 7. On day 8 and thereafter, take 1mg  tablet by mouth twice daily. 53 each 0   No current facility-administered medications for this visit.   Family History  Problem Relation Age of Onset   Heart disease Mother    Heart  disease Father    Colon polyps Father    Breast cancer Sister 48   Colon cancer Paternal Uncle    Colon polyps Paternal Uncle    Stomach cancer Maternal Grandmother    Social History   Socioeconomic History   Marital status: Divorced    Spouse name: Not on file   Number of children: 2   Years of education: Not on file   Highest education level: Not on file  Occupational History   Not on file  Tobacco Use   Smoking status: Some Days    Current packs/day: 2.00    Average packs/day: 2.0 packs/day for 41.8 years (83.7 ttl pk-yrs)    Types: Cigarettes    Start date: 1984   Smokeless tobacco: Never   Tobacco comments:    Has smoked for about 40 years. Smokes about 1/2 ppd.   Vaping Use   Vaping status: Never Used  Substance and Sexual Activity   Alcohol use: Not Currently    Alcohol/week: 0.0 standard drinks of alcohol    Comment: 09/04/2017 a mixed drink a few times/year   Drug use: No   Sexual activity: Not Currently  Other Topics Concern   Not on file  Social History Narrative   Not on file   Social Drivers of Health   Financial Resource Strain: Medium Risk (09/04/2017)   Overall Financial Resource Strain (CARDIA)    Difficulty of Paying Living Expenses: Somewhat hard  Food Insecurity: No Food Insecurity (09/04/2017)   Hunger Vital Sign    Worried About Running Out of Food in the Last Year: Never true    Ran Out of Food in the Last Year: Never true  Transportation Needs: No Transportation Needs (09/04/2017)   PRAPARE - Administrator, Civil Service (Medical): No    Lack of Transportation (Non-Medical): No  Physical Activity: Sufficiently Active (09/04/2017)   Exercise Vital Sign    Days of Exercise per Week: 3 days    Minutes of Exercise per Session: 60 min  Stress: Stress Concern Present (09/04/2017)   [employer] of Occupational Health - Occupational Stress Questionnaire    Feeling of Stress : Very much  Social Connections: Moderately Isolated  (09/04/2017)   Social Connection and Isolation Panel    Frequency of Communication with Friends and Family: Once a week    Frequency of Social Gatherings with Friends and Family: More than three times a week    Attends Religious Services: Never    Database Administrator or Organizations: No    Attends Banker Meetings: Never    Marital Status: Divorced   Review of Systems: Pertinent items are noted in HPI. Objective:  Vitals:   08/06/24 0814  BP: 118/78  Pulse: 81  Temp: 97.8 F (36.6 C)  TempSrc: Oral  SpO2: 100%  Weight: 101 lb 3.2 oz (45.9 kg)  Height: 5' 3 (1.6 m)    Physical Exam: GEN: Well appearing, NAD. Oriented x 3, normal mood and affect  HEENT: Normocephalic, atraumatic, Conjunctiva clear, sclera non-icteric CV: RRR NEURO:  moving all extremities spontaneously in upper and lower extremities. PSYCH: Oriented X3, intact recent and remote memory, judgment and insight, normal mood and affect.   Assessment & Plan:   Assessment & Plan Migraine with aura and without status migrainosus, not intractable The headaches she is describing (N/V, visual/tactile aura) appear c/w migraine although it is concerning that they are increasing in frequency. Fortunately, still responsive to triptan abortive therapy, however at this point would favor Neurology evaluation given increasing frequency and for potential prophylactic therapy. Anxiety and depression PHQ9 21, no SI/HI. Increase Wellbutrin  to 450 mg. Will monitor response to increase in Wellbutrin  and add on additional therapy if needed as I do think adjunctive SSRI therapy would be helpful.   Encounter for smoking cessation counseling Wellbutrin  increased as above. Insomnia, unspecified type Cont trazodone  PRN. Counseled on sleep hygiene, start melatonin nightly.   Jone Dauphin MD

## 2024-08-06 NOTE — Assessment & Plan Note (Signed)
 Cont trazodone  PRN. Counseled on sleep hygiene, start melatonin nightly.

## 2024-08-06 NOTE — Progress Notes (Signed)
 Cardiology Office Note:  .   Date:  08/06/2024  ID:  Heather [name], DOB Sep 12, 1971, MRN [id number] PCP: Shawn Sick, MD  [facility] Providers Cardiologist:  Newman Lawrence, MD PCP: Shawn Sick, MD  Chief Complaint  Patient presents with   Chest Pain     Heather [name] is a 53 y.o. female with h/o stage Ia adenocarcinoma of lung right upper lobectomy (08/2017), centrilobular emphysema, former heavy smoker, anemia, anxiety/depression, strong family h/o CAD and AAA    Discussed the use of AI scribe software for clinical note transcription with the patient, who gave verbal consent to proceed.  History of Present Illness   I last saw the patient in 2022.  Since then, she has seen other providers/APP's.  She was last seen by Josefa Beauvais in 08/2023 for what seem to be chronic chest discomfort, of pleuritic nature.  Recently, patient had called with recurrent chest pain symptoms.  Patient continues to have episodes of chest pain at both rest and exertion, lasting for several minutes at a time with no particular aggravating movement relieving factors.  She does engage stable exertional dyspnea, attributed to emphysema.  She is still smoking, but has come down to only about 2 to 3 cigarettes a week.    Vitals:   08/06/24 0915  BP: 104/68  Pulse: 72  SpO2: 99%      Review of Systems  Cardiovascular:  Positive for chest pain. Negative for dyspnea on exertion, leg swelling, palpitations and syncope.        Studies Reviewed: .        CT chest 06/2024: 1. Lung-RADS 2, benign appearance or behavior. Continue annual screening with low-dose chest CT without contrast in 12 months. 2. Post right upper lobectomy. 3. Aortic Atherosclerosis (ICD10-I70.0) and Emphysema (ICD10-J43.9).   Stress test 2022: Nondiagnostic ECG stress due to pharmacologic stress. Stress symptoms included non limiting chest pain and dyspnea. Additionally, patient exercised on a Modified  Bruce protocol achieving 2.27 METS. Overall LV systolic function is normal without regional wall motion abnormalities. Myocardial perfusion is normal. Stress LV EF: 67%. No previous exam available for comparison. Low risk study.   Echocardiogram 2022: Left ventricle cavity is normal in size and wall thickness. Normal global wall motion. Normal LV systolic function with EF 67%. Normal diastolic filling pattern. Mild tricuspid regurgitation. No evidence of pulmonary hypertension  Abdominal Aortic Duplex 2022:  Mild plaque noted in the mid and distal aorta. There is ectasia noted in  the proximal and distal aorta measuring approximately 2.4 cm. Recommend re  evaluation in 5 years for stability.  Normal iliac artery velocity.     Labs 06/2024: Chol 190, TG 102, HDL 63, LDL 109 Hb 13.9 Cr 0.80 Trop HS 2, d-Dimer 0.3 TSH 1.7  2024: HbA1C 5.5%   Physical Exam Vitals and nursing note reviewed.  Constitutional:      General: She is not in acute distress. Neck:     Vascular: No JVD.  Cardiovascular:     Rate and Rhythm: Normal rate and regular rhythm.     Heart sounds: Normal heart sounds. No murmur heard. Pulmonary:     Effort: Pulmonary effort is normal.     Breath sounds: Normal breath sounds. No wheezing or rales.  Musculoskeletal:     Right lower leg: No edema.     Left lower leg: No edema.      VISIT DIAGNOSES:   ICD-10-CM   1. Precordial pain  R07.2 nitroGLYCERIN  (NITROSTAT )  0.4 MG SL tablet    ECHOCARDIOGRAM COMPLETE    MYOCARDIAL PERFUSION IMAGING    Cardiac Stress Test: Informed Consent Details: Physician/Practitioner Attestation; Transcribe to consent form and obtain patient signature    2. Family history of abdominal aortic aneurysm (AAA)  Z82.49        Heather [name] is a 53 y.o. female with h/o stage Ia adenocarcinoma of lung right upper lobectomy (08/2017), centrilobular emphysema, former heavy smoker, anemia, anxiety/depression, strong family h/o  CAD and AAA  Assessment & Plan  Chest pain: Low suspicion for angina, but has risk factors for CAD with family history, personal history of smoking. Previously unremarkable stress test in 2022. Ideally, wanted to perform coronary CT angiogram, but patient has had significant prior history of allergic reaction. Will obtain exercise nuclear stress test or echocardiogram.  Family history of abdominal aortic aneurysm: Previously evaluated with ultrasound in 2022 with only mild ectasia noted. Recommend repeat ultrasound in 2027.  Since I will be seeing her as needed, I will defer this to her PCP.   Informed Consent   Shared Decision Making/Informed Consent The risks [chest pain, shortness of breath, cardiac arrhythmias, dizziness, blood pressure fluctuations, myocardial infarction, stroke/transient ischemic attack, nausea, vomiting, allergic reaction, radiation exposure, metallic taste sensation and life-threatening complications (estimated to be 1 in 10,000)], benefits (risk stratification, diagnosing coronary artery disease, treatment guidance) and alternatives of a nuclear stress test were discussed in detail with [name] and she agrees to proceed.       Meds ordered this encounter  Medications   nitroGLYCERIN  (NITROSTAT ) 0.4 MG SL tablet    Sig: Place 1 tablet (0.4 mg total) under the tongue every 5 (five) minutes as needed for chest pain. Please call 911 if you are needing to take more than 3 doses in one 24 hour period    Dispense:  25 tablet    Refill:  2     F/u as needed, unless any significant abnormalities found on above testing.  Signed, Newman JINNY Lawrence, MD

## 2024-08-06 NOTE — Assessment & Plan Note (Signed)
 PHQ9 21, no SI/HI. Increase Wellbutrin  to 450 mg. Will monitor response to increase in Wellbutrin  and add on additional therapy if needed as I do think adjunctive SSRI therapy would be helpful.

## 2024-08-06 NOTE — Patient Instructions (Addendum)
 Medication Instructions:  No Changes  Lab Work: None   Testing/Procedures: Your physician has requested that you have an echocardiogram. Echocardiography is a painless test that uses sound waves to create images of your heart. It provides your doctor with information about the size and shape of your heart and how well your heart's chambers and valves are working. This procedure takes approximately one hour. There are no restrictions for this procedure. Please do NOT wear cologne, perfume, aftershave, or lotions (deodorant is allowed). Please arrive 15 minutes prior to your appointment time.  Please note: We ask at that you not bring children with you during ultrasound (echo/ vascular) testing. Due to room size and safety concerns, children are not allowed in the ultrasound rooms during exams. Our front office staff cannot provide observation of children in our lobby area while testing is being conducted. An adult accompanying a patient to their appointment will only be allowed in the ultrasound room at the discretion of the ultrasound technician under special circumstances. We apologize for any inconvenience.    Your physician has requested that you have en exercise stress myoview. For further information please visit [email or web address] Please follow instruction sheet, as given.   Dr. Patwarhan has ordered a Myocardial Perfusion Imaging Study.   The test will take approximately 3 to 4 hours to complete; you may bring reading material.  If someone comes with you to your appointment, they will need to remain in the main lobby due to limited space in the testing area. **If you are pregnant or breastfeeding, please notify the nuclear lab prior to your appointment**  You will need to hold the following medications prior to your stress test: beta-blockers (24 hours prior to test)   How to prepare for your Myocardial Perfusion Test: Do not eat or drink 3 hours prior to your test, except you may  have water. Do not consume products containing caffeine  (regular or decaffeinated) 12 hours prior to your test. (ex: coffee, chocolate, sodas, tea). Do wear comfortable clothes (no dresses or overalls) and walking shoes, tennis shoes preferred (No heels or open toe shoes are allowed). Do NOT wear cologne, perfume, aftershave, or lotions (deodorant is allowed). If these instructions are not followed, your test will have to be rescheduled.   Follow-Up: At [facility], you and your health needs are our priority.  As part of our continuing mission to provide you with exceptional heart care, our providers are all part of one team.  This team includes your primary Cardiologist (physician) and Advanced Practice Providers or APPs (Physician Assistants and Nurse Practitioners) who all work together to provide you with the care you need, when you need it.  Your next appointment:    AS NEEDED  Provider:   Newman JINNY Lawrence, MD

## 2024-08-06 NOTE — Assessment & Plan Note (Signed)
 Wellbutrin  increased as above.

## 2024-08-08 NOTE — Telephone Encounter (Signed)
 Pharmacy team will await return of [place]  forms from patient - she read MyChart message that was sent on 07/18/24

## 2024-08-11 ENCOUNTER — Ambulatory Visit

## 2024-08-13 ENCOUNTER — Other Ambulatory Visit (HOSPITAL_COMMUNITY): Payer: Self-pay

## 2024-09-12 ENCOUNTER — Other Ambulatory Visit: Payer: Self-pay | Admitting: Cardiology

## 2024-09-12 DIAGNOSIS — R072 Precordial pain: Secondary | ICD-10-CM

## 2024-09-16 ENCOUNTER — Ambulatory Visit (HOSPITAL_COMMUNITY): Admission: RE | Admit: 2024-09-16 | Source: Ambulatory Visit | Attending: Cardiology | Admitting: Cardiology

## 2024-09-16 ENCOUNTER — Ambulatory Visit (HOSPITAL_COMMUNITY)

## 2024-10-10 ENCOUNTER — Other Ambulatory Visit (HOSPITAL_COMMUNITY): Payer: Self-pay

## 2024-10-22 ENCOUNTER — Encounter (HOSPITAL_COMMUNITY): Payer: Self-pay

## 2024-10-22 ENCOUNTER — Ambulatory Visit (HOSPITAL_COMMUNITY)
Admission: RE | Admit: 2024-10-22 | Discharge: 2024-10-22 | Disposition: A | Discharge status: Home / Self Care | Payer: Self-pay | Source: Ambulatory Visit

## 2024-10-22 ENCOUNTER — Ambulatory Visit (HOSPITAL_COMMUNITY)

## 2024-10-22 ENCOUNTER — Other Ambulatory Visit (HOSPITAL_COMMUNITY): Payer: Self-pay

## 2024-10-22 VITALS — BP 110/70 | HR 83 | Temp 97.5°F | Resp 17

## 2024-10-22 DIAGNOSIS — J209 Acute bronchitis, unspecified: Secondary | ICD-10-CM

## 2024-10-22 DIAGNOSIS — R051 Acute cough: Secondary | ICD-10-CM

## 2024-10-22 MED ORDER — PREDNISONE 20 MG PO TABS
40.0000 mg | ORAL_TABLET | Freq: Every day | ORAL | 0 refills | Status: AC
  Filled 2024-10-22: qty 10, 5d supply, fill #0

## 2024-10-22 MED ORDER — ALBUTEROL SULFATE HFA 108 (90 BASE) MCG/ACT IN AERS
1.0000 | INHALATION_SPRAY | Freq: Four times a day (QID) | RESPIRATORY_TRACT | 0 refills | Status: AC | PRN
  Filled 2024-10-22: qty 6.7, 17d supply, fill #0

## 2024-10-22 MED ORDER — PROMETHAZINE-DM 6.25-15 MG/5ML PO SYRP
5.0000 mL | ORAL_SOLUTION | Freq: Every evening | ORAL | 0 refills | Status: AC | PRN
  Filled 2024-10-22: qty 118, 23d supply, fill #0

## 2024-10-22 NOTE — ED Provider Notes (Signed)
 " [facility]    CSN: [id number] Arrival date & time: 10/22/24  9164      History   Chief Complaint Chief Complaint  Patient presents with   Appointment    HPI Heather [name] is a 54 y.o. female.   This 54 year old female is being seen for complaints of fever, chills, body aches, fatigue, headache, rhinorrhea, cough, chest congestion ongoing for 5 days.  She endorses headache, dizziness, sore throat.  She denies ear pain, chest pain, shortness of breath, abdominal pain, nausea, vomiting, diarrhea.  She has been taking DayQuil, NyQuil, Tylenol  without significant relief of symptoms.     Past Medical History:  Diagnosis Date   Blood dyscrasia    COPD (chronic obstructive pulmonary disease) (HCC)    Current smoker    Difficult intubation    pt was told difficult intubation at [facility]   Factor V deficiency (HCC)    protein deficiency   H/O drug rash due to contrast  05/14/2014   Headache    weekly (09/04/2017)   Heart murmur    Hypokalemia 10/15/2017   Lung adenocarcinoma dx'd 08/2017   s/p RU lobectomy   Pleuritic chest pain 06/13/2018   Pre-diabetes    Pulmonary nodule 07/16/2017   Rapid resting heart rate    happens infrequently   Rib pain on right side 12/21/2017    Patient Active Problem List   Diagnosis Date Noted   COPD with acute exacerbation (HCC) 07/17/2024   Encounter for smoking cessation counseling 07/17/2024   IDA (iron  deficiency anemia) 06/25/2024   Anxiety and depression 06/18/2024   Acute stress disorder 06/18/2024   RLS (restless legs syndrome) 06/18/2024   Left wrist pain 02/13/2024   Adenocarcinoma of upper lobe of right lung (HCC) 08/16/2023   Leg cramp 05/30/2023   Breast lump on right side at 11 o'clock position 04/04/2023   Family history of abdominal aortic aneurysm (AAA) 07/13/2021   Claudication in peripheral vascular disease 07/13/2021   Insomnia 02/24/2020   Depression, major, recurrent 02/24/2020   Visual  aura 12/23/2019   Precordial pain 06/13/2018   S/P lobectomy of lung 08/20/2017   Cervical adenopathy 07/04/2017   Factor V Leiden 04/13/2015   Prediabetes 04/13/2015   History of colon polyps 04/13/2015   Nonintractable headache 11/24/2014   Loss of weight 04/02/2014   Tobacco use disorder 04/02/2014   Encounter for screening for lung cancer 04/02/2014   Centrilobular emphysema (HCC) 04/02/2014    Past Surgical History:  Procedure Laterality Date   BIOPSY  04/25/2021   Procedure: BIOPSY;  Surgeon: Legrand Victory LITTIE DOUGLAS, MD;  Location: [facility];  Service: Gastroenterology;;  Egd and COLON   CESAREAN SECTION  1992; 2012   COLONOSCOPY N/A 05/15/2014   Procedure: COLONOSCOPY;  Surgeon: Princella CHRISTELLA Nida, MD;  Location: [facility];  Service: Endoscopy;  Laterality: N/A;   COLONOSCOPY WITH PROPOFOL  N/A 04/25/2021   Procedure: COLONOSCOPY WITH PROPOFOL ;  Surgeon: Legrand Victory LITTIE DOUGLAS, MD;  Location: [facility];  Service: Gastroenterology;  Laterality: N/A;   ESOPHAGOGASTRODUODENOSCOPY N/A 05/15/2014   Procedure: ESOPHAGOGASTRODUODENOSCOPY (EGD);  Surgeon: Princella CHRISTELLA Nida, MD;  Location: [facility];  Service: Endoscopy;  Laterality: N/A;   ESOPHAGOGASTRODUODENOSCOPY (EGD) WITH PROPOFOL  N/A 04/25/2021   Procedure: ESOPHAGOGASTRODUODENOSCOPY (EGD) WITH PROPOFOL ;  Surgeon: Legrand Victory LITTIE DOUGLAS, MD;  Location: [facility];  Service: Gastroenterology;  Laterality: N/A;   LAPAROSCOPY  07/07/2011   Procedure: LAPAROSCOPY DIAGNOSTIC;  Surgeon: Robbi JONELLE Render;  Location: [facility];  Service:  Gynecology;  Laterality: N/A;   LESION REMOVAL  07/07/2011   Procedure: EXCISION VAGINAL LESION;  Surgeon: Robbi JONELLE Render;  Location: [facility];  Service: Gynecology;  Laterality: N/A;   LOBECTOMY Right 08/20/2017   Procedure: RIGHT UPPER LOBE LOBECTOMY;  Surgeon: Kerrin Elspeth BROCKS, MD;  Location: [facility];  Service: Thoracic;  Laterality: Right;   LYMPH NODE DISSECTION N/A 08/20/2017   Procedure: LYMPH NODE DISSECTION;   Surgeon: Kerrin Elspeth BROCKS, MD;  Location: [facility];  Service: Thoracic;  Laterality: N/A;   POLYPECTOMY  04/25/2021   Procedure: POLYPECTOMY;  Surgeon: Legrand Victory LITTIE DOUGLAS, MD;  Location: [facility];  Service: Gastroenterology;;   VAGINAL HYSTERECTOMY  ~ 2014   VIDEO ASSISTED THORACOSCOPY (VATS)/WEDGE RESECTION Right 08/20/2017   Procedure: RIGHT VIDEO ASSISTED THORACOSCOPY (VATS)/WEDGE RESECTION;  Surgeon: Kerrin Elspeth BROCKS, MD;  Location: [facility];  Service: Thoracic;  Laterality: Right;    OB History   No obstetric history on file.      Home Medications    Prior to Admission medications  Medication Sig Start Date End Date Taking? Authorizing Provider  albuterol  (VENTOLIN  HFA) 108 (90 Base) MCG/ACT inhaler Inhale 1-2 puffs into the lungs every 6 (six) hours as needed for wheezing or shortness of breath. 10/22/24  Yes Cherica Heiden C, FNP  predniSONE  (DELTASONE ) 20 MG tablet Take 2 tablets (40 mg total) by mouth daily with breakfast for 5 days. 10/22/24 10/27/24 Yes Milany Geck C, FNP  promethazine -dextromethorphan (PROMETHAZINE -DM) 6.25-15 MG/5ML syrup Take 5 mLs by mouth at bedtime as needed for cough. 10/22/24  Yes Linken Mcglothen C, FNP  budesonide -glycopyrrolate -formoterol  (BREZTRI  AEROSPHERE) 160-9-4.8 MCG/ACT AERO inhaler Inhale 2 puffs into the lungs in the morning and at bedtime. 01/29/24   Arellano Zameza, Priscila, MD  buPROPion  (WELLBUTRIN  XL) 150 MG 24 hr tablet Take 3 tablets (450 mg total) by mouth daily. 08/06/24 08/06/25  Shawn Sick, MD  cetirizine  (ZYRTEC  ALLERGY) 10 MG tablet Take 1 tablet (10 mg total) by mouth daily. Patient not taking: Reported on 08/06/2024 02/13/24 02/12/25  Lovie Clarity, MD  Ensifentrine  3 MG/2.5ML SUSP Inhale 3 mg into the lungs in the morning and at bedtime. 07/17/24   Pawar, Sammi, MD  melatonin 5 MG TABS Take 1 tablet (5 mg total) by mouth Nightly. 08/06/24   Shawn Sick, MD  nitroGLYCERIN  (NITROSTAT ) 0.4 MG SL tablet Place 1 tablet (0.4 mg total)  under the tongue every 5 (five) minutes as needed for chest pain. Please call 911 if you are needing to take more than 3 doses in one 24 hour period 08/06/24   Patwardhan, Newman PARAS, MD  SUMAtriptan  (IMITREX ) 25 MG tablet Take 1 tablet (25 mg total) by mouth once as needed for up to 1 dose for migraine. May repeat in 2 hours if headache persists or recurs. Do NOT exceed more than 2 doses in 24 hours. 08/06/24   Shawn Sick, MD  traZODone  (DESYREL ) 50 MG tablet Take 1 tablet (50 mg total) by mouth daily as needed for up to 28 days for sleep. 08/06/24 09/03/24  Shawn Sick, MD    Family History Family History  Problem Relation Age of Onset   Heart disease Mother    Heart disease Father    Colon polyps Father    Breast cancer Sister 78   Colon cancer Paternal Uncle    Colon polyps Paternal Uncle    Stomach cancer Maternal Grandmother     Social History Social History[1]   Allergies   Iohexol  and Aspirin   Review of Systems Review of Systems  Constitutional:  Positive for activity change, appetite change, chills, fatigue and fever.  HENT:  Positive for congestion, rhinorrhea and sore throat. Negative for ear pain, sinus pressure and sinus pain.   Respiratory:  Positive for cough. Negative for shortness of breath.   Cardiovascular:  Negative for chest pain.  Gastrointestinal:  Negative for abdominal pain, diarrhea, nausea and vomiting.  Musculoskeletal:  Positive for myalgias.  Skin:  Negative for color change and rash.  Neurological:  Positive for dizziness and headaches.  All other systems reviewed and are negative.    Physical Exam Triage Vital Signs ED Triage Vitals  Encounter Vitals Group     BP 10/22/24 0857 110/70     Girls Systolic BP Percentile --      Girls Diastolic BP Percentile --      Boys Systolic BP Percentile --      Boys Diastolic BP Percentile --      Pulse Rate 10/22/24 0857 83     Resp 10/22/24 0857 17     Temp 10/22/24 0857 (!) 97.5 F (36.4 C)      Temp Source 10/22/24 0857 Oral     SpO2 10/22/24 0857 98 %     Weight --      Height --      Head Circumference --      Peak Flow --      Pain Score 10/22/24 0856 0     Pain Loc --      Pain Education --      Exclude from Growth Chart --    No data found.  Updated Vital Signs BP 110/70 (BP Location: Left Arm)   Pulse 83   Temp (!) 97.5 F (36.4 C) (Oral)   Resp 17   SpO2 98%   Visual Acuity Right Eye Distance:   Left Eye Distance:   Bilateral Distance:    Right Eye Near:   Left Eye Near:    Bilateral Near:     Physical Exam Vitals and nursing note reviewed.  Constitutional:      General: She is not in acute distress.    Appearance: She is well-developed. She is not toxic-appearing.     Comments: Pleasant female appearing stated age found sitting in chair in no acute distress.  HENT:     Head: Normocephalic and atraumatic.     Right Ear: Tympanic membrane and external ear normal.     Left Ear: Tympanic membrane and external ear normal.     Nose: Rhinorrhea present. No congestion.     Mouth/Throat:     Lips: Pink.     Mouth: Mucous membranes are moist.     Pharynx: No oropharyngeal exudate or posterior oropharyngeal erythema.  Eyes:     Conjunctiva/sclera: Conjunctivae normal.  Cardiovascular:     Rate and Rhythm: Normal rate and regular rhythm.     Heart sounds: Normal heart sounds. No murmur heard. Pulmonary:     Effort: Pulmonary effort is normal. No respiratory distress.     Breath sounds: Decreased breath sounds present.  Abdominal:     General: Bowel sounds are normal.     Palpations: Abdomen is soft.     Tenderness: There is no abdominal tenderness.  Musculoskeletal:     Cervical back: Neck supple.  Skin:    General: Skin is warm and dry.     Capillary Refill: Capillary refill takes less than 2 seconds.  Neurological:  Mental Status: She is alert.  Psychiatric:        Mood and Affect: Mood normal.      UC Treatments / Results  Labs (all  labs ordered are listed, but only abnormal results are displayed) Labs Reviewed - No data to display  EKG   Radiology DG Chest 2 View Result Date: 10/22/2024 CLINICAL DATA:  Cough. EXAM: CHEST - 2 VIEW COMPARISON:  Chest radiograph dated 06/13/2024. FINDINGS: No focal consolidation, pleural effusion or pneumothorax. Bilateral nipple shadows noted. The cardiac silhouette is within normal limits. No acute osseous pathology. IMPRESSION: No active cardiopulmonary disease. Electronically Signed   By: Vanetta Chou M.D.   On: 10/22/2024 09:37    Procedures Procedures (including critical care time)  Medications Ordered in UC Medications - No data to display  Initial Impression / Assessment and Plan / UC Course  I have reviewed the triage vital signs and the nursing notes.  Pertinent labs & imaging results that were available during my care of the patient were reviewed by me and considered in my medical decision making (see chart for details).     Vitals and triage reviewed, patient is hemodynamically stable.  Due to duration of symptoms, defer COVID and flu testing.  Chest x-ray obtained and is negative for active cardiopulmonary disease.  Presentation consistent with bronchitis.  She is given prescriptions for prednisone , albuterol  inhaler, promethazine -DM.  Advised supportive care with Tylenol , guaifenesin , honey water, salt water gargles.  Plan of care, follow-up care, return precautions given, no questions at this time.  Work note provided. Final Clinical Impressions(s) / UC Diagnoses   Final diagnoses:  Acute cough  Acute bronchitis, unspecified organism     Discharge Instructions      Your evaluation shows you have a viral infection of the upper airways of your lungs. Use the following medicines to help with your symptoms:  Take prednisone  steroid pills as prescribed.  2 pills- 40mg - once daily each morning with food/breakfast for 5 days Do not take ibuprofen /naproxen/other  NSAIDs while taking steroid pills as this can cause upset stomach.   Albuterol  inhaler 1-2 puffs every 4-6 hours as needed for cough, shortness of breath, and wheezing.  Guaifenesin  (mucinex ) as needed for cough/nasal congestion.   Promethazine  DM as needed for cough at bedtime- this medicine will make you sleepy so only use at nighttime.   Honey water, 2 teaspoons honey dissolved in 1 cup warm water, drink every 4-6 hours may help with throat pain, cough.  Salt water gargles, 1/2-1 teaspoon salt dissolved in 1 cup warm water, gargle and spit out every 4-6 hours may help with throat pains.  If you develop any new or worsening symptoms or if your symptoms do not start to improve, please return here or follow-up with your primary care provider. If your symptoms are severe, please go to the emergency room.      ED Prescriptions     Medication Sig Dispense Auth. Provider   predniSONE  (DELTASONE ) 20 MG tablet Take 2 tablets (40 mg total) by mouth daily with breakfast for 5 days. 10 tablet Jordynne Mccown C, FNP   albuterol  (VENTOLIN  HFA) 108 (90 Base) MCG/ACT inhaler Inhale 1-2 puffs into the lungs every 6 (six) hours as needed for wheezing or shortness of breath. 6.7 g Javeria Briski C, FNP   promethazine -dextromethorphan (PROMETHAZINE -DM) 6.25-15 MG/5ML syrup Take 5 mLs by mouth at bedtime as needed for cough. 118 mL Alphonse Asbridge C, FNP      PDMP not  reviewed this encounter.    [1]  Social History Tobacco Use   Smoking status: Some Days    Current packs/day: 2.00    Average packs/day: 2.0 packs/day for 42.1 years (84.1 ttl pk-yrs)    Types: Cigarettes    Start date: 1984   Smokeless tobacco: Never   Tobacco comments:    Has smoked for about 40 years. Smokes about 1/2 ppd.   Vaping Use   Vaping status: Never Used  Substance Use Topics   Alcohol use: Not Currently    Alcohol/week: 0.0 standard drinks of alcohol    Comment: 09/04/2017 a mixed drink a few times/year   Drug use:  No     Lennice Jon BROCKS, FNP 10/22/24 1014  "

## 2024-10-22 NOTE — ED Triage Notes (Signed)
 Pt reports Friday night started having fatigue, body aches, headache, chills, fevers and chest congestion. Taking Dayquil, Nyquil, tylenol .

## 2024-10-22 NOTE — Discharge Instructions (Addendum)
 Your evaluation shows you have a viral infection of the upper airways of your lungs. Use the following medicines to help with your symptoms:  Take prednisone  steroid pills as prescribed.  2 pills- 40mg - once daily each morning with food/breakfast for 5 days Do not take ibuprofen /naproxen/other NSAIDs while taking steroid pills as this can cause upset stomach.   Albuterol  inhaler 1-2 puffs every 4-6 hours as needed for cough, shortness of breath, and wheezing.  Guaifenesin  (mucinex ) as needed for cough/nasal congestion.   Promethazine  DM as needed for cough at bedtime- this medicine will make you sleepy so only use at nighttime.   Honey water, 2 teaspoons honey dissolved in 1 cup warm water, drink every 4-6 hours may help with throat pain, cough.  Salt water gargles, 1/2-1 teaspoon salt dissolved in 1 cup warm water, gargle and spit out every 4-6 hours may help with throat pains.  If you develop any new or worsening symptoms or if your symptoms do not start to improve, please return here or follow-up with your primary care provider. If your symptoms are severe, please go to the emergency room.

## 2024-10-23 ENCOUNTER — Ambulatory Visit

## 2024-10-27 ENCOUNTER — Ambulatory Visit (HOSPITAL_COMMUNITY): Payer: Self-pay

## 2024-10-28 ENCOUNTER — Telehealth: Payer: Self-pay

## 2024-10-28 NOTE — Telephone Encounter (Signed)
 COPD : RN Telephone Call   Patient ID: Heather [name], female    DOB: May 27, 1971  Age: 54 y.o. MRN: [id number]  Heather [name] is a 54 y.o. who was called about COPD management.   Patient reports that they are taking the following medications for COPD: YES  Patient reports they DO or DO NOT have a prior pulmonologist who they follow:   [facility]  Please document smoking status, how many packs? Ready to quit ?  - Smoking Hx: She currently smokes 1 CIGS per week.   mMRC ([employer]) Dyspnea Scale   Ask the following questions and please check the box that describes the most.   Choose ONE>   []  Grade 0: I only get breathless with strenuous exercise   []  Grade 1: I get short of breath when hurry on level ground or walking up a slight hill   []  Grade 2: On level Ground, I walk slower than people of the same age because of breathlessness or have to stop for breath when walking my own pace   []  Grade 3: I Stop for breath after walking about 100 yards or after a few minutes on level ground   []  Grade 4: I am too breathless to leave the house, or I am breathless when dressing   ( PT REPORTS  THAT ALL THE ABOVE ARE REGARDING HER )   CAT (COPD Assessment Test)   Please ask each of the following questions and select between 0-5 based on what describes the patient most accurately.  On a scale 0 to 5, do you never cough or cough all the time: 4  1 5  (My chest feels very tight) 5 (When I walk up a hill or one flight of stairs I am very breathless) 4 3 5  (I don't sleep soundly because of my lung condition) On a scale 0 to 5, how would you describe your energy?: 5 (I have no energy at all)  Total: 32

## 2024-10-30 ENCOUNTER — Other Ambulatory Visit (HOSPITAL_COMMUNITY): Payer: Self-pay

## 2024-11-03 ENCOUNTER — Ambulatory Visit: Payer: Self-pay | Admitting: Student

## 2024-11-06 ENCOUNTER — Ambulatory Visit: Admitting: Student

## 2024-11-06 ENCOUNTER — Other Ambulatory Visit (HOSPITAL_COMMUNITY): Payer: Self-pay

## 2024-11-07 ENCOUNTER — Ambulatory Visit

## 2024-11-12 ENCOUNTER — Ambulatory Visit: Payer: Self-pay

## 2024-11-24 ENCOUNTER — Ambulatory Visit (HOSPITAL_COMMUNITY)

## 2024-12-29 ENCOUNTER — Ambulatory Visit

## 2025-01-12 ENCOUNTER — Ambulatory Visit: Admitting: Neurology
